# Patient Record
Sex: Male | Born: 1940
Health system: Southern US, Community
[De-identification: ages and names within clinical notes are randomized; demographics above are authoritative.]

## PROBLEM LIST (undated history)

## (undated) DIAGNOSIS — N200 Calculus of kidney: Secondary | ICD-10-CM

## (undated) DIAGNOSIS — E785 Hyperlipidemia, unspecified: Secondary | ICD-10-CM

## (undated) DIAGNOSIS — C4491 Basal cell carcinoma of skin, unspecified: Secondary | ICD-10-CM

## (undated) DIAGNOSIS — C801 Malignant (primary) neoplasm, unspecified: Secondary | ICD-10-CM

## (undated) DIAGNOSIS — M7732 Calcaneal spur, left foot: Secondary | ICD-10-CM

## (undated) DIAGNOSIS — M1990 Unspecified osteoarthritis, unspecified site: Secondary | ICD-10-CM

## (undated) DIAGNOSIS — M545 Low back pain, unspecified: Secondary | ICD-10-CM

## (undated) DIAGNOSIS — I2510 Atherosclerotic heart disease of native coronary artery without angina pectoris: Secondary | ICD-10-CM

## (undated) DIAGNOSIS — M25569 Pain in unspecified knee: Secondary | ICD-10-CM

## (undated) DIAGNOSIS — I509 Heart failure, unspecified: Secondary | ICD-10-CM

## (undated) DIAGNOSIS — I499 Cardiac arrhythmia, unspecified: Secondary | ICD-10-CM

## (undated) DIAGNOSIS — I10 Essential (primary) hypertension: Secondary | ICD-10-CM

## (undated) DIAGNOSIS — M722 Plantar fascial fibromatosis: Secondary | ICD-10-CM

## (undated) DIAGNOSIS — I4819 Other persistent atrial fibrillation: Secondary | ICD-10-CM

## (undated) DIAGNOSIS — G8929 Other chronic pain: Secondary | ICD-10-CM

## (undated) DIAGNOSIS — Z5189 Encounter for other specified aftercare: Secondary | ICD-10-CM

## (undated) DIAGNOSIS — H269 Unspecified cataract: Secondary | ICD-10-CM

## (undated) DIAGNOSIS — T7840XA Allergy, unspecified, initial encounter: Secondary | ICD-10-CM

## (undated) HISTORY — DX: Other persistent atrial fibrillation: I48.19

## (undated) HISTORY — DX: Pain in unspecified knee: M25.569

## (undated) HISTORY — DX: Calculus of kidney: N20.0

## (undated) HISTORY — DX: Hyperlipidemia, unspecified: E78.5

## (undated) HISTORY — DX: Plantar fascial fibromatosis: M72.2

## (undated) HISTORY — DX: Calcaneal spur, left foot: M77.32

## (undated) HISTORY — DX: Heart failure, unspecified: I50.9

## (undated) HISTORY — DX: Allergy, unspecified, initial encounter: T78.40XA

## (undated) HISTORY — DX: Low back pain, unspecified: M54.50

## (undated) HISTORY — DX: Atherosclerotic heart disease of native coronary artery without angina pectoris: I25.10

## (undated) HISTORY — DX: Other chronic pain: G89.29

## (undated) HISTORY — DX: Encounter for other specified aftercare: Z51.89

## (undated) HISTORY — PX: HERNIA REPAIR: SHX51

## (undated) HISTORY — DX: Low back pain: M54.5

## (undated) HISTORY — DX: Malignant (primary) neoplasm, unspecified: C80.1

## (undated) HISTORY — DX: Basal cell carcinoma of skin, unspecified: C44.91

## (undated) HISTORY — DX: Essential (primary) hypertension: I10

## (undated) HISTORY — DX: Unspecified osteoarthritis, unspecified site: M19.90

## (undated) HISTORY — PX: BACK SURGERY: SHX140

## (undated) HISTORY — PX: SPINE SURGERY: SHX786

## (undated) HISTORY — DX: Unspecified cataract: H26.9

## (undated) HISTORY — PX: VASCULAR SURGERY: SHX849

## (undated) HISTORY — PX: JOINT REPLACEMENT: SHX530

---

## 1979-05-10 HISTORY — PX: SKIN CANCER EXCISION: SHX779

## 1997-12-31 ENCOUNTER — Ambulatory Visit (HOSPITAL_COMMUNITY): Admission: RE | Admit: 1997-12-31 | Discharge: 1997-12-31 | Payer: Self-pay | Admitting: *Deleted

## 2003-03-06 HISTORY — PX: CARDIAC CATHETERIZATION: SHX172

## 2003-04-09 HISTORY — PX: CORONARY ANGIOPLASTY WITH STENT PLACEMENT: SHX49

## 2003-04-25 ENCOUNTER — Ambulatory Visit (HOSPITAL_COMMUNITY): Admission: RE | Admit: 2003-04-25 | Discharge: 2003-04-26 | Payer: Self-pay | Admitting: Interventional Cardiology

## 2004-03-05 ENCOUNTER — Inpatient Hospital Stay (HOSPITAL_BASED_OUTPATIENT_CLINIC_OR_DEPARTMENT_OTHER): Admission: RE | Admit: 2004-03-05 | Discharge: 2004-03-05 | Payer: Self-pay | Admitting: Interventional Cardiology

## 2007-11-21 ENCOUNTER — Other Ambulatory Visit: Payer: Self-pay

## 2007-11-21 ENCOUNTER — Inpatient Hospital Stay: Payer: Self-pay | Admitting: Internal Medicine

## 2008-11-27 ENCOUNTER — Encounter: Admission: RE | Admit: 2008-11-27 | Discharge: 2008-11-27 | Payer: Self-pay | Admitting: Orthopedic Surgery

## 2009-05-01 ENCOUNTER — Encounter: Payer: Self-pay | Admitting: Internal Medicine

## 2009-05-03 DIAGNOSIS — G43909 Migraine, unspecified, not intractable, without status migrainosus: Secondary | ICD-10-CM | POA: Insufficient documentation

## 2009-05-03 DIAGNOSIS — E785 Hyperlipidemia, unspecified: Secondary | ICD-10-CM | POA: Insufficient documentation

## 2009-05-03 DIAGNOSIS — M549 Dorsalgia, unspecified: Secondary | ICD-10-CM

## 2009-05-03 DIAGNOSIS — I10 Essential (primary) hypertension: Secondary | ICD-10-CM | POA: Insufficient documentation

## 2009-05-03 DIAGNOSIS — Z87891 Personal history of nicotine dependence: Secondary | ICD-10-CM

## 2009-05-03 DIAGNOSIS — I2510 Atherosclerotic heart disease of native coronary artery without angina pectoris: Secondary | ICD-10-CM

## 2009-05-03 DIAGNOSIS — R079 Chest pain, unspecified: Secondary | ICD-10-CM

## 2009-05-03 DIAGNOSIS — M25569 Pain in unspecified knee: Secondary | ICD-10-CM | POA: Insufficient documentation

## 2009-05-04 ENCOUNTER — Ambulatory Visit: Payer: Self-pay | Admitting: Internal Medicine

## 2009-05-09 ENCOUNTER — Encounter: Payer: Self-pay | Admitting: Internal Medicine

## 2009-06-08 ENCOUNTER — Ambulatory Visit: Payer: Self-pay | Admitting: Internal Medicine

## 2009-07-12 ENCOUNTER — Encounter: Payer: Self-pay | Admitting: Internal Medicine

## 2009-07-13 ENCOUNTER — Ambulatory Visit: Payer: Self-pay | Admitting: Internal Medicine

## 2009-07-16 LAB — CONVERTED CEMR LAB: Prothrombin Time: 23.6 s — ABNORMAL HIGH (ref 11.6–15.2)

## 2009-07-17 ENCOUNTER — Telehealth: Payer: Self-pay | Admitting: Internal Medicine

## 2009-07-17 ENCOUNTER — Encounter: Payer: Self-pay | Admitting: Internal Medicine

## 2009-07-17 ENCOUNTER — Ambulatory Visit: Payer: Self-pay | Admitting: Internal Medicine

## 2009-07-17 LAB — CONVERTED CEMR LAB
Basophils Relative: 1 % (ref 0.0–3.0)
Eosinophils Relative: 2 % (ref 0.0–5.0)
GFR calc non Af Amer: 88.71 mL/min (ref >60)
Glucose, Bld: 127 mg/dL — ABNORMAL HIGH (ref 70–99)
HCT: 45.7 % (ref 39.0–52.0)
Hemoglobin: 15.4 g/dL (ref 13.0–17.0)
Lymphs Abs: 2.3 10*3/uL (ref 0.7–4.0)
Monocytes Relative: 7.6 % (ref 3.0–12.0)
Neutro Abs: 5.4 10*3/uL (ref 1.4–7.7)
Potassium: 4.2 meq/L (ref 3.5–5.1)
Sodium: 145 meq/L (ref 135–145)
WBC: 8.7 10*3/uL (ref 4.5–10.5)

## 2009-07-20 ENCOUNTER — Ambulatory Visit (HOSPITAL_COMMUNITY): Admission: RE | Admit: 2009-07-20 | Discharge: 2009-07-20 | Payer: Self-pay | Admitting: Internal Medicine

## 2009-07-20 ENCOUNTER — Ambulatory Visit: Payer: Self-pay | Admitting: Internal Medicine

## 2009-07-30 ENCOUNTER — Encounter: Payer: Self-pay | Admitting: Internal Medicine

## 2009-10-05 ENCOUNTER — Ambulatory Visit: Payer: MEDICARE | Admitting: Family Medicine

## 2010-08-21 ENCOUNTER — Ambulatory Visit: Payer: MEDICARE

## 2010-10-08 NOTE — Consult Note (Signed)
Summary: Lendon Colonel   Imported By: Marylou Mccoy 09/18/2009 17:50:39  _____________________________________________________________________  External Attachment:    Type:   Image     Comment:   External Document

## 2010-12-11 LAB — MAGNESIUM: Magnesium: 2.3 mg/dL (ref 1.5–2.5)

## 2011-01-24 NOTE — Cardiovascular Report (Signed)
NAME:  Larry Bautista, Larry Bautista NO.:  [id number]   MEDICAL RECORD NO.:  [id number]                   PATIENT TYPE:  OIB   LOCATION:  NA                                   FACILITY:  [facility]   PHYSICIAN:  Lesleigh Noe, M.D.            DATE OF BIRTH:  10-Sep-1940   DATE OF PROCEDURE:  DATE OF DISCHARGE:                              CARDIAC CATHETERIZATION   INDICATIONS FOR PROCEDURE:  Mr. Cina underwent CYPHER stent implantation  in the LAD in August of 2004.  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  He had a  totally normal stress Cardiolite performed in April of 2005 without evidence  of infarction or ischemia.  The previously treated LAD lesion noted a  reduction in stenosis in the LAD that was greater than 80% down to 0  percent.  There was residual 50 to 70% narrowing in a diagonal that arose  from within the stented regions.   PROCEDURE PERFORMED:  1. Left-heart catheterization.  2. Selective coronary angiogram.  3. Left ventriculography.   DESCRIPTION OF PROCEDURE:  After informed consent, a 4-French sheath was  placed in the right femoral artery using the modified Seldinger technique.  A 4-French A-2 multipurpose catheter was used for hemodynamic recordings.  Left ventriculography by hand injection, and selective right coronary  angiography.  A #4-French left coronary catheter was used for left coronary  angiography.  The patient tolerated the procedure without complications.   RESULTS:  1. Hemodynamic data:     A. Aortic pressure 110/67.     B. Left ventricular pressure 110/11 mmHg.  2. Left ventriculography:  There is normal left ventricular size.  There is     normal overall left ventricular function.  The ejection fraction is felt     to be on the 50 to 55% range.  3. Coronary angiography:     A. Left main coronary:  Widely patent and free of any significant  obstruction.     B. Left anterior descending coronary:  The left anterior descending        coronary artery is widely patent.  The LAD is trans apical.  The        previously placed stent in the proximal LAD is widely patent without        evidence of narrowing.  There is an eccentric 50 to 70% narrowing in        the ostium of the first diagonal branch that arises from within the        stented region.  This is unchanged when compared to the prior post        intervention film.     C. Circumflex artery:  The circumflex coronary artery is large, giving        origin to a branching obtuse marginal.  No significant obstruction  is        noted.  Luminal irregularities are seen.     D. Right coronary:  The right coronary artery is widely patent.  Minimal        luminal irregularities are noted in the mid vessel.  A large PDA and        two left ventricular branches are noted that are also free of any        significant obstruction.   CONCLUSIONS:  1. Widely patent stent in the proximal LAD without evidence of restenosis     compared with the post percutaneous intervention procedure in 2004.     There is persistent 50 to 70% narrowing at the ostium of a diagonal     branch that arises from within the stented region.  This is stable when     compared to the post-intervention images.  2. Luminal irregularities are noted in the mid right coronary.  3. No significant coronary obstructive lesions are felt to be present.  This     is confirmed by a normal Cardiolite study without evidence of ischemia,     performed in April of 2005.  4. Normal LV function.                                               Lesleigh Noe, M.D.    HWS/MEDQ  D:  03/05/2004  T:  03/05/2004  Job:  [id number]   cc:   Jalene Mullet  [address], [place]. [place] [address]   Lorie Phenix  [address]  [place]  [place] [address]  Fax: [phone]

## 2011-01-24 NOTE — H&P (Signed)
NAME:  Larry Bautista, Larry Bautista                        ACCOUNT NO.:  [id number]   MEDICAL RECORD NO.:  [id number]                   PATIENT TYPE:  OIB   LOCATION:  6531                                 FACILITY:  [facility]   PHYSICIAN:  Lyn Records, M.D.                DATE OF BIRTH:  [birth year]   DATE OF ADMISSION:  04/25/2003  DATE OF DISCHARGE:                                HISTORY & PHYSICAL   ADMISSION DIAGNOSES:  1. Abnormal Cardiolite.  2. Exertional jaw and neck discomfort, suspect anginal equivalent.  3. Hyperlipidemia.  4. Hypertension.  5. History of tobacco use.  6. Family history of coronary artery disease.   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  Over the last three to four months, he has been complaining of jaw  discomfort and neck discomfort with exertion.  He also has occasional  palpitations and dyspnea on exertion.  He apparently saw a cardiologist in  [place] who recommended a coronary angiography, but the patient was  reluctant to proceed and requested a second opinion.   He saw Dr. Katrinka Blazing on March 24, 2003, and based on the patient's history,  comorbidities, and symptoms, Dr. Katrinka Blazing recommended stress Cardiolite to rule  out ischemia and get a sense of his blood pressure status.   Cardiolite stress test was performed on April 14, 2003. The patient  exercised into stage IV of Bruce protocol with a total duration of 10  minutes 30 seconds and development of neck discomfort and horizontal ST  segment depression in leads I, III, aVF, V4 through V6, which persisted into  the third minute of recovery before resolving.  The Cardiolite images  suggested subtle evidence of apical ischemia and normal wall motion with EF  of 65%.   Based on these findings, cardiac catheterization was recommended.   ALLERGIES:  CELEBREX causes rash.   MEDICATIONS:  1. Zocor 20 mg  at bedtime.  2. Multivitamins.  3. Vitamin C.  4. Baby aspirin 81 mg a day.  5. Voltaren p.r.n.  6. Duradrin 3 to 5 times a year for migraines.   PAST MEDICAL HISTORY:  1. Hyperlipidemia.  2. Migraines.  3. History of kidney stones.  4. Low back discomfort status post surgery for spondylolisthesis.  5. Chronic right knee discomfort.   SOCIAL HISTORY:  He is either divorced or widowed.  He has a 71 year old  daughter who is in excellent health.  He smoked cigarettes for two or three  years at less than a pack a day and discontinued them about 10 years ago.  No alcohol.  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   FAMILY HISTORY:  Father died of MI at 56.  Mother died of lung cancer at 57.  One sister is 36 in good health.  On brother had CABG at age 73  and is  currently 75.  There is another brother with a history of ventricular  arrhythmia.   REVIEW OF SYSTEMS:  See HPI, otherwise unremarkable.   PHYSICAL EXAMINATION:  Performed by Dr. Katrinka Blazing.  SKIN:  Clear.  HEENT:  Unremarkable.  EOMs are full.  No xanthelasma.  LUNGS:  Clear to auscultation and percussion.  COR:  No gallop, click, rub, or murmur.  ABDOMEN: Soft, bowel sounds normal.  No tenderness.  EXTREMITIES:  No edema, 2+ distal pulses, symmetric in upper and lower  extremities.  NEUROLOGIC:  Appears to be normal with no sensory or motor deficits noted.  VITAL SIGNS:  Upon admission to hospital, temperature 97.2, heart rate 66,  respirations 20, blood pressure 120/78.   IMPRESSION:  1. Abnormal Cardiolite with reproduction of angina and ST segment changes     during insertion.  2. Hyperlipidemia.  3. Dehydrated, diastolic response to exercise.  4. Family history.  5. History of tobacco.   PLAN:  Dr. Katrinka Blazing recommends proceeding with diagnostic coronary angiography  and possible intervention given abnormal Cardiolite and risk factors as  mentioned above.  The risks and indications for the procedure were reviewed  with the  patient, and he agreed to proceed.  Procedure is scheduled for  April 25, 2003, with Dr. Katrinka Blazing.       Georgiann Cocker Jernejcic, P.A.                   Lyn Records, M.D.    TCJ/MEDQ  D:  04/26/2003  T:  04/26/2003  Job:  [id number]   cc:   Dr. Darrel Reach [address]  [place], [place] [address]

## 2011-01-24 NOTE — Discharge Summary (Signed)
NAME:  Larry Bautista, Larry Bautista                        ACCOUNT NO.:  [id number]   MEDICAL RECORD NO.:  [id number]                   PATIENT TYPE:  OIB   LOCATION:  6531                                 FACILITY:  [facility]   PHYSICIAN:  Lyn Records, M.D.                DATE OF BIRTH:  [birth year]   DATE OF ADMISSION:  04/25/2003  DATE OF DISCHARGE:  04/26/2003                                 DISCHARGE SUMMARY   ADMISSION DIAGNOSES:  1. Abnormal Cardiolite suggesting apical ischemia.  2. Exertional angina.  3. Hypertension.  4. Hyperlipidemia.   DISCHARGE DIAGNOSES:  1. Cardiac catheterization revealing single vessel/branch coronary artery     disease (CAD).  2. Abnormal Cardiolite suggesting apical ischemia.  3. Exertional angina.  4. Hypertension.  5. Hyperlipidemia.   REFERRING PHYSICIAN:  Betsey Amen, M.D., [address], [place], [place]  [address]   HISTORY OF PRESENT ILLNESS:  Larry Bautista is a pleasant 70 year old  gentleman who was initially evaluated by Dr. Katrinka Blazing on March 24, 2003 because  of exertional bilateral neck discomfort.  He is a Occupational hygienist for [employer].  Apparently in the past, the patient had seen a  cardiologist in [place] because of exertional neck and jaw discomfort and  coronary angiography was recommended, but the patient was reluctant to  proceed and wanted a second opinion.   The patient describes a 3-4 month history of jaw discomfort and neck  discomfort if he walks faster than 3 mph for any length of time. He has  occasional associated palpitations and there is a history of prior  documented PVCs.  He has also been experiencing dyspnea on exertion.  There  has been no orthopnea, PND, and no syncope.   The patient's cardiac risk factors include: Hypertension, hyperlipidemia, a  history of tobacco use, and a positive family history of coronary artery  disease.   Dr. Katrinka Blazing recommended stress testing which was performed on  April 14, 2003  and was abnormal for subtle evidence of apical ischemia.  Normal wall motion  with an EF of 65%.  Abnormal electrocardiographic response to exercise with  relatively diffuse horizontal ST segment depression in V3-V6 as well as 2,  3, and AVF with associated neck discomfort that resolved in recovery.  Dr.  Katrinka Blazing recommended proceeding with coronary angiography.  The risks and  indications for the procedure were reviewed with the patient and he agreed  to proceed.   PROCEDURE:  Cardiac catheterization with intervention by Dr. Katrinka Blazing on April 25, 2003.   COMPLICATIONS:  None.   CONSULTATIONS:  None.   COURSE IN HOSPITAL:  Larry Bautista was brought to [facility] on  April 25, 2003 for elective cardiac catheterization with possible  intervention.  Prior to procedure laboratory studies were within acceptable  limits.   Cardiac catheterization performed by Dr. Katrinka Blazing revealed the following: LV  function normal.  Coronary angiography revealed the left main to be okay,  LAD 99% proximal and 80% diagonal 1 lesion.  The circ was okay and the RCA  was okay.   He proceeded with PCI of the LAD reducing the 99% diagonal/80% LAD  bifurcation lesion to a less than 10% LAD and placed a Sypher.  There was  residual 90% diagonal stenosis.  The patient tolerated the procedure well.  Dr. Katrinka Blazing recommended aspirin and Plavix for 1 year.  Integrilin was bolused  and infused for a total of 18 hours.   Postprocedure when catheter was removed the patient had a level 1 hematoma  which was expelled.  On April 26, 2003 the patient remained stable and the  right groin was okay.  He did have a potassium of 4.5, BUN 10, and  creatinine of 0.8.  Platelet count was 253.  Dr. Katrinka Blazing felt that the patient was stable to be discharged to home.  He did  recognize that this stented LAD did gel a large diagonal but there were no  ischemic symptoms.  He recommends follow up in 7-10 days with  Plavix for a year and no flying  until further evaluation.   DISCHARGE MEDICATIONS:  1. Plavix 75 mg a day for a year.  2. Aspirin 325 mg.  3. Zocor 20 mg a day.  4. Nitroglycerin 0.4 mg under the tongue as needed for chest pain.   DISCHARGE INSTRUCTIONS:  1. Activity as tolerated.  2. Low salt diet.  3. No flying.  4. Usual activity starting May 02, 2003.  5. Follow up is scheduled with Dr. Katrinka Blazing for Thursday, May 04, 2003 at 2     o'clock.  6. Note to Dr. Katrinka Blazing -- consider adding ACE inhibitor therapy for     endothelial dysfunction and possibly increasing Zocor to 40 mg a day/lipo     meta analysis.      Georgiann Cocker Jernejcic, P.A.                   Lyn Records, M.D.    TCJ/MEDQ  D:  04/26/2003  T:  04/27/2003  Job:  [id number]   cc:   B. Eddy Liszewski  [address]  [place]  [address]

## 2011-09-05 ENCOUNTER — Other Ambulatory Visit (HOSPITAL_COMMUNITY): Payer: Self-pay | Admitting: Interventional Cardiology

## 2011-09-05 DIAGNOSIS — R0600 Dyspnea, unspecified: Secondary | ICD-10-CM

## 2011-09-05 DIAGNOSIS — I4891 Unspecified atrial fibrillation: Secondary | ICD-10-CM

## 2011-09-05 DIAGNOSIS — I5189 Other ill-defined heart diseases: Secondary | ICD-10-CM

## 2011-09-10 ENCOUNTER — Encounter: Payer: Self-pay | Admitting: Interventional Cardiology

## 2011-09-15 ENCOUNTER — Ambulatory Visit (HOSPITAL_COMMUNITY)
Admission: RE | Admit: 2011-09-15 | Discharge: 2011-09-15 | Disposition: A | Discharge status: Home / Self Care | Payer: Medicare Other | Source: Ambulatory Visit | Attending: Interventional Cardiology | Admitting: Interventional Cardiology

## 2011-09-15 DIAGNOSIS — I499 Cardiac arrhythmia, unspecified: Secondary | ICD-10-CM | POA: Insufficient documentation

## 2011-09-15 DIAGNOSIS — R0600 Dyspnea, unspecified: Secondary | ICD-10-CM

## 2011-09-15 DIAGNOSIS — I4891 Unspecified atrial fibrillation: Secondary | ICD-10-CM

## 2011-09-15 DIAGNOSIS — I5189 Other ill-defined heart diseases: Secondary | ICD-10-CM

## 2011-09-15 MED ORDER — GADOBENATE DIMEGLUMINE 529 MG/ML IV SOLN
20.0000 mL | Freq: Once | INTRAVENOUS | Status: AC
  Administered 2011-09-15: 20 mL via INTRAVENOUS

## 2011-09-18 DIAGNOSIS — R0602 Shortness of breath: Secondary | ICD-10-CM | POA: Diagnosis not present

## 2011-10-01 DIAGNOSIS — I4891 Unspecified atrial fibrillation: Secondary | ICD-10-CM | POA: Diagnosis not present

## 2011-10-01 DIAGNOSIS — Z7901 Long term (current) use of anticoagulants: Secondary | ICD-10-CM | POA: Diagnosis not present

## 2011-11-07 ENCOUNTER — Encounter: Payer: Self-pay | Admitting: *Deleted

## 2011-11-10 ENCOUNTER — Ambulatory Visit (INDEPENDENT_AMBULATORY_CARE_PROVIDER_SITE_OTHER): Payer: Medicare Other | Admitting: Internal Medicine

## 2011-11-10 ENCOUNTER — Encounter: Payer: Self-pay | Admitting: Internal Medicine

## 2011-11-10 DIAGNOSIS — I10 Essential (primary) hypertension: Secondary | ICD-10-CM | POA: Diagnosis not present

## 2011-11-10 DIAGNOSIS — I4891 Unspecified atrial fibrillation: Secondary | ICD-10-CM

## 2011-11-10 DIAGNOSIS — I2510 Atherosclerotic heart disease of native coronary artery without angina pectoris: Secondary | ICD-10-CM | POA: Diagnosis not present

## 2011-11-10 NOTE — Assessment & Plan Note (Signed)
Given myoview, SOB seems less likely to be due to CAD.

## 2011-11-10 NOTE — Assessment & Plan Note (Signed)
Stable No change required today  

## 2011-11-10 NOTE — Patient Instructions (Signed)
Your physician recommends that you schedule a follow-up appointment in: 6 weeks with Dr Allred  

## 2011-11-10 NOTE — Assessment & Plan Note (Signed)
Longstanding persistent afib presently appropriately anticoagulated with coumadin. He has only tried multaq for rhythm control.  I think that given his DOE, that is would be very reasonable to try to establish/ maintain sinus rhythm to see if these symptoms improve.  I would advised that he try tikosyn. Presently, he is reluctant to consider tikosyn, stating that he would like to see if he feels better with recently self decreased toprol.  I will respect his request to defer initiation of tikosyn.  He will return in 6 weeks.  If he remains dyspneic on his lower dose of toprol then he may be more willing to consider tikosyn at that time.  He is very clear that he wishes to avoid catheter ablation.

## 2011-11-10 NOTE — Progress Notes (Signed)
Primary Cardiologist:  Dr Katrinka Blazing  The patient presents today for routine electrophysiology followup.  I have not seen him in several years.  He has longstanding persistent afib at this point.  He has tried only Multaq for rhythm control and was unsuccessfully cardioverted on this medication.  He has previously declined ablation and therefore has been maintained on a rate control strategy with coumadin for anticoagulation.  Since last being seen in our clinic, the patient reports doing reasonably well.  He developed significant increase in shortness of breath several months ago.  He reports dypsnea with moderate activity.  Echo, nuclear study, and cardiac MRI were all performed (I have reviewed these today).  The patient has decreased his toprol to 25mg  daily and thinks that his symptoms may have improved.  He states that he presently has a URI and is not sure whether or not his DOE has improved but "thinks it may have".   Today, he denies symptoms of palpitations, chest pain, orthopnea, PND, lower extremity edema, dizziness, presyncope, syncope, or neurologic sequela.  The patient feels that he is tolerating medications without difficulties and is otherwise without complaint today.   Past Medical History  Diagnosis Date  . Persistent atrial fibrillation     longstanding persistent (since 05/2008)  . Coronary artery disease     with LAD DE stent 2004  . Hypertension   . Hyperlipidemia   . Migraine   . Kidney stones   . Chronic knee pain   . Lower back pain     status post surgery for spondylolisthesis   Past Surgical History  Procedure Date  . Back surgery     spondylolisthesis  . Coronary angioplasty with stent placement 04/2003    CYPHER stent implantation in the LAD  . Cardiac catheterization 03/06/2003    Current Outpatient Prescriptions  Medication Sig Dispense Refill  . aspirin 81 MG tablet Take 81 mg by mouth daily.      [name] atorvastatin (LIPITOR) 10 MG tablet Take 10 mg by mouth  daily.      . diphenhydramine-acetaminophen (TYLENOL PM) 25-500 MG TABS Take 1 tablet by mouth at bedtime as needed.      . fish oil-omega-3 fatty acids 1000 MG capsule Take 1 g by mouth daily.      . furosemide (LASIX) 20 MG tablet Take 20 mg by mouth daily.      . metoprolol succinate (TOPROL-XL) 50 MG 24 hr tablet Take 50 mg by mouth daily. Take with or immediately following a meal.      . multivitamin (THERAGRAN) per tablet Take 1 tablet by mouth daily.      . nitroGLYCERIN (NITROSTAT) 0.4 MG SL tablet Place 0.4 mg under the tongue every 5 (five) minutes as needed.      . Potassium Gluconate 550 MG TABS Take 550 mg by mouth daily. ( OTC potassium )      . psyllium (METAMUCIL) 58.6 % powder Take 1 packet by mouth as needed.      . vitamin C (ASCORBIC ACID) 500 MG tablet Take 500 mg by mouth daily.      [name] warfarin (COUMADIN) 5 MG tablet Take by mouth. As directed by the [facility]        Allergies  Allergen Reactions  . Simvastatin   . Celecoxib Rash    History   Social History  . Marital Status: Legally Separated    Spouse Name: N/A    Number of Children: N/A  . Years  of Education: N/A   Occupational History  . Not on file.   Social History Main Topics  . Smoking status: Former Smoker    Types: Cigarettes    Quit date: 11/06/1996  . Smokeless tobacco: Not on file  . Alcohol Use: No  . Drug Use: No  . Sexually Active: Not on file   Other Topics Concern  . Not on file   Social History Narrative  . No narrative on file    Family History  Problem Relation Age of Onset  . Heart attack Father   . Cancer Father     rectal  . Atrial fibrillation Brother   . Atrial fibrillation Brother     resolved with cardioversion  . Coronary artery disease Brother     with CABG at 13    ROS-  All systems are reviewed and are negative except as outlined in the HPI above   Physical Exam: Filed Vitals:   11/10/11 0920  BP: 130/70  Pulse: 82  Resp: 18  Height: 5\' 8"   (1.727 m)  Weight: 176 lb 1.9 oz (79.888 kg)    GEN- The patient is well appearing, alert and oriented x 3 today.   Head- normocephalic, atraumatic Eyes-  Sclera clear, conjunctiva pink Ears- hearing intact Oropharynx- clear Neck- supple, no JVP Lymph- no cervical lymphadenopathy Lungs- Clear to ausculation bilaterally, normal work of breathing Heart- irregular rate and rhythm, no murmurs, rubs or gallops, PMI not laterally displaced GI- soft, NT, ND, + BS Extremities- no clubbing, cyanosis, or edema  ekg today reveals afib, V rate 82 bpm, QRS 76, Qtc 418, otherwise normal ekg  Assessment and Plan:

## 2011-11-13 DIAGNOSIS — I4891 Unspecified atrial fibrillation: Secondary | ICD-10-CM | POA: Diagnosis not present

## 2011-11-13 DIAGNOSIS — Z7901 Long term (current) use of anticoagulants: Secondary | ICD-10-CM | POA: Diagnosis not present

## 2011-11-13 DIAGNOSIS — E785 Hyperlipidemia, unspecified: Secondary | ICD-10-CM | POA: Diagnosis not present

## 2011-12-12 ENCOUNTER — Encounter: Payer: Self-pay | Admitting: Internal Medicine

## 2011-12-15 DIAGNOSIS — I4891 Unspecified atrial fibrillation: Secondary | ICD-10-CM | POA: Diagnosis not present

## 2011-12-15 DIAGNOSIS — Z7901 Long term (current) use of anticoagulants: Secondary | ICD-10-CM | POA: Diagnosis not present

## 2011-12-22 ENCOUNTER — Ambulatory Visit: Payer: MEDICARE | Admitting: Internal Medicine

## 2012-01-26 DIAGNOSIS — Z7901 Long term (current) use of anticoagulants: Secondary | ICD-10-CM | POA: Diagnosis not present

## 2012-01-26 DIAGNOSIS — I10 Essential (primary) hypertension: Secondary | ICD-10-CM | POA: Diagnosis not present

## 2012-01-26 DIAGNOSIS — I4891 Unspecified atrial fibrillation: Secondary | ICD-10-CM | POA: Diagnosis not present

## 2012-01-26 DIAGNOSIS — I2510 Atherosclerotic heart disease of native coronary artery without angina pectoris: Secondary | ICD-10-CM | POA: Diagnosis not present

## 2012-01-26 DIAGNOSIS — R0989 Other specified symptoms and signs involving the circulatory and respiratory systems: Secondary | ICD-10-CM | POA: Diagnosis not present

## 2012-01-26 DIAGNOSIS — R0609 Other forms of dyspnea: Secondary | ICD-10-CM | POA: Diagnosis not present

## 2012-02-16 DIAGNOSIS — G4730 Sleep apnea, unspecified: Secondary | ICD-10-CM | POA: Diagnosis not present

## 2012-02-16 DIAGNOSIS — I2510 Atherosclerotic heart disease of native coronary artery without angina pectoris: Secondary | ICD-10-CM | POA: Diagnosis not present

## 2012-02-16 DIAGNOSIS — R413 Other amnesia: Secondary | ICD-10-CM | POA: Diagnosis not present

## 2012-02-16 DIAGNOSIS — R5383 Other fatigue: Secondary | ICD-10-CM | POA: Diagnosis not present

## 2012-02-16 DIAGNOSIS — R0602 Shortness of breath: Secondary | ICD-10-CM | POA: Diagnosis not present

## 2012-02-16 LAB — CBC AND DIFFERENTIAL
HCT: 50 % (ref 41–53)
Hemoglobin: 16.8 g/dL (ref 13.5–17.5)
Platelets: 258 10*3/uL (ref 150–399)
WBC: 9.6 10*3/mL

## 2012-02-16 LAB — BASIC METABOLIC PANEL
BUN: 14 mg/dL (ref 4–21)
CREATININE: 0.7 mg/dL (ref <=1.3)
Glucose: 87 mg/dL
Potassium: 5.3 mmol/L (ref 3.4–5.3)
Sodium: 145 mmol/L (ref 137–147)

## 2012-02-16 LAB — HEPATIC FUNCTION PANEL
ALT: 36 U/L (ref 10–40)
AST: 29 U/L (ref 14–40)

## 2012-02-23 DIAGNOSIS — I10 Essential (primary) hypertension: Secondary | ICD-10-CM | POA: Diagnosis not present

## 2012-02-23 DIAGNOSIS — I4891 Unspecified atrial fibrillation: Secondary | ICD-10-CM | POA: Diagnosis not present

## 2012-02-23 DIAGNOSIS — M79609 Pain in unspecified limb: Secondary | ICD-10-CM | POA: Diagnosis not present

## 2012-02-23 DIAGNOSIS — I2510 Atherosclerotic heart disease of native coronary artery without angina pectoris: Secondary | ICD-10-CM | POA: Diagnosis not present

## 2012-02-23 DIAGNOSIS — E785 Hyperlipidemia, unspecified: Secondary | ICD-10-CM | POA: Diagnosis not present

## 2012-03-08 DIAGNOSIS — I4891 Unspecified atrial fibrillation: Secondary | ICD-10-CM | POA: Diagnosis not present

## 2012-03-08 DIAGNOSIS — Z7901 Long term (current) use of anticoagulants: Secondary | ICD-10-CM | POA: Diagnosis not present

## 2012-03-15 DIAGNOSIS — N401 Enlarged prostate with lower urinary tract symptoms: Secondary | ICD-10-CM | POA: Diagnosis not present

## 2012-03-15 DIAGNOSIS — N400 Enlarged prostate without lower urinary tract symptoms: Secondary | ICD-10-CM | POA: Diagnosis not present

## 2012-03-15 DIAGNOSIS — R3129 Other microscopic hematuria: Secondary | ICD-10-CM | POA: Diagnosis not present

## 2012-03-18 DIAGNOSIS — Z7901 Long term (current) use of anticoagulants: Secondary | ICD-10-CM | POA: Diagnosis not present

## 2012-03-18 DIAGNOSIS — I4891 Unspecified atrial fibrillation: Secondary | ICD-10-CM | POA: Diagnosis not present

## 2012-04-05 DIAGNOSIS — Z7901 Long term (current) use of anticoagulants: Secondary | ICD-10-CM | POA: Diagnosis not present

## 2012-04-05 DIAGNOSIS — I4891 Unspecified atrial fibrillation: Secondary | ICD-10-CM | POA: Diagnosis not present

## 2012-05-03 DIAGNOSIS — I4891 Unspecified atrial fibrillation: Secondary | ICD-10-CM | POA: Diagnosis not present

## 2012-05-03 DIAGNOSIS — Z7901 Long term (current) use of anticoagulants: Secondary | ICD-10-CM | POA: Diagnosis not present

## 2012-05-17 DIAGNOSIS — M545 Low back pain, unspecified: Secondary | ICD-10-CM | POA: Diagnosis not present

## 2012-05-17 DIAGNOSIS — Z23 Encounter for immunization: Secondary | ICD-10-CM | POA: Diagnosis not present

## 2012-05-17 DIAGNOSIS — J189 Pneumonia, unspecified organism: Secondary | ICD-10-CM | POA: Diagnosis not present

## 2012-05-17 DIAGNOSIS — I2510 Atherosclerotic heart disease of native coronary artery without angina pectoris: Secondary | ICD-10-CM | POA: Diagnosis not present

## 2012-05-19 DIAGNOSIS — Z23 Encounter for immunization: Secondary | ICD-10-CM | POA: Diagnosis not present

## 2012-06-14 DIAGNOSIS — I4891 Unspecified atrial fibrillation: Secondary | ICD-10-CM | POA: Diagnosis not present

## 2012-06-14 DIAGNOSIS — E785 Hyperlipidemia, unspecified: Secondary | ICD-10-CM | POA: Diagnosis not present

## 2012-06-14 DIAGNOSIS — Z7901 Long term (current) use of anticoagulants: Secondary | ICD-10-CM | POA: Diagnosis not present

## 2012-07-16 DIAGNOSIS — I4891 Unspecified atrial fibrillation: Secondary | ICD-10-CM | POA: Diagnosis not present

## 2012-07-16 DIAGNOSIS — Z7901 Long term (current) use of anticoagulants: Secondary | ICD-10-CM | POA: Diagnosis not present

## 2012-07-20 DIAGNOSIS — G459 Transient cerebral ischemic attack, unspecified: Secondary | ICD-10-CM | POA: Diagnosis not present

## 2012-07-20 DIAGNOSIS — I5032 Chronic diastolic (congestive) heart failure: Secondary | ICD-10-CM | POA: Diagnosis not present

## 2012-07-20 DIAGNOSIS — Z7901 Long term (current) use of anticoagulants: Secondary | ICD-10-CM | POA: Diagnosis not present

## 2012-07-20 DIAGNOSIS — I4891 Unspecified atrial fibrillation: Secondary | ICD-10-CM | POA: Diagnosis not present

## 2012-07-20 DIAGNOSIS — I2510 Atherosclerotic heart disease of native coronary artery without angina pectoris: Secondary | ICD-10-CM | POA: Diagnosis not present

## 2012-07-20 DIAGNOSIS — I10 Essential (primary) hypertension: Secondary | ICD-10-CM | POA: Diagnosis not present

## 2012-08-03 DIAGNOSIS — I4891 Unspecified atrial fibrillation: Secondary | ICD-10-CM | POA: Diagnosis not present

## 2012-08-03 DIAGNOSIS — Z7901 Long term (current) use of anticoagulants: Secondary | ICD-10-CM | POA: Diagnosis not present

## 2012-08-20 DIAGNOSIS — G459 Transient cerebral ischemic attack, unspecified: Secondary | ICD-10-CM | POA: Diagnosis not present

## 2012-08-24 DIAGNOSIS — I10 Essential (primary) hypertension: Secondary | ICD-10-CM | POA: Diagnosis not present

## 2012-08-24 DIAGNOSIS — I4891 Unspecified atrial fibrillation: Secondary | ICD-10-CM | POA: Diagnosis not present

## 2012-08-24 DIAGNOSIS — E785 Hyperlipidemia, unspecified: Secondary | ICD-10-CM | POA: Diagnosis not present

## 2012-08-24 DIAGNOSIS — I2510 Atherosclerotic heart disease of native coronary artery without angina pectoris: Secondary | ICD-10-CM | POA: Diagnosis not present

## 2012-08-24 DIAGNOSIS — Z7901 Long term (current) use of anticoagulants: Secondary | ICD-10-CM | POA: Diagnosis not present

## 2012-09-08 HISTORY — PX: HAND SURGERY: SHX662

## 2012-10-05 DIAGNOSIS — I4891 Unspecified atrial fibrillation: Secondary | ICD-10-CM | POA: Diagnosis not present

## 2012-10-05 DIAGNOSIS — Z7901 Long term (current) use of anticoagulants: Secondary | ICD-10-CM | POA: Diagnosis not present

## 2012-10-07 DIAGNOSIS — M5030 Other cervical disc degeneration, unspecified cervical region: Secondary | ICD-10-CM | POA: Diagnosis not present

## 2012-10-07 DIAGNOSIS — M47812 Spondylosis without myelopathy or radiculopathy, cervical region: Secondary | ICD-10-CM | POA: Diagnosis not present

## 2012-10-07 DIAGNOSIS — M542 Cervicalgia: Secondary | ICD-10-CM | POA: Diagnosis not present

## 2012-10-07 DIAGNOSIS — IMO0002 Reserved for concepts with insufficient information to code with codable children: Secondary | ICD-10-CM | POA: Diagnosis not present

## 2012-10-07 DIAGNOSIS — M5412 Radiculopathy, cervical region: Secondary | ICD-10-CM | POA: Diagnosis not present

## 2012-10-13 ENCOUNTER — Other Ambulatory Visit: Payer: Self-pay | Admitting: Neurosurgery

## 2012-10-13 DIAGNOSIS — M5412 Radiculopathy, cervical region: Secondary | ICD-10-CM

## 2012-10-13 DIAGNOSIS — M47812 Spondylosis without myelopathy or radiculopathy, cervical region: Secondary | ICD-10-CM

## 2012-10-13 DIAGNOSIS — M5030 Other cervical disc degeneration, unspecified cervical region: Secondary | ICD-10-CM

## 2012-10-13 DIAGNOSIS — IMO0002 Reserved for concepts with insufficient information to code with codable children: Secondary | ICD-10-CM

## 2012-10-26 DIAGNOSIS — I4891 Unspecified atrial fibrillation: Secondary | ICD-10-CM | POA: Diagnosis not present

## 2012-10-26 DIAGNOSIS — G5620 Lesion of ulnar nerve, unspecified upper limb: Secondary | ICD-10-CM | POA: Diagnosis not present

## 2012-10-26 DIAGNOSIS — Z7901 Long term (current) use of anticoagulants: Secondary | ICD-10-CM | POA: Diagnosis not present

## 2012-10-26 DIAGNOSIS — G5600 Carpal tunnel syndrome, unspecified upper limb: Secondary | ICD-10-CM | POA: Diagnosis not present

## 2012-10-26 DIAGNOSIS — M4802 Spinal stenosis, cervical region: Secondary | ICD-10-CM | POA: Diagnosis not present

## 2012-11-04 ENCOUNTER — Ambulatory Visit
Admission: RE | Admit: 2012-11-04 | Discharge: 2012-11-04 | Disposition: A | Discharge status: Home / Self Care | Payer: Medicare Other | Source: Ambulatory Visit | Attending: Neurosurgery | Admitting: Neurosurgery

## 2012-11-04 DIAGNOSIS — IMO0002 Reserved for concepts with insufficient information to code with codable children: Secondary | ICD-10-CM

## 2012-11-04 DIAGNOSIS — M5412 Radiculopathy, cervical region: Secondary | ICD-10-CM

## 2012-11-04 DIAGNOSIS — M5030 Other cervical disc degeneration, unspecified cervical region: Secondary | ICD-10-CM

## 2012-11-04 DIAGNOSIS — M5137 Other intervertebral disc degeneration, lumbosacral region: Secondary | ICD-10-CM | POA: Diagnosis not present

## 2012-11-04 DIAGNOSIS — M47812 Spondylosis without myelopathy or radiculopathy, cervical region: Secondary | ICD-10-CM

## 2012-11-04 MED ORDER — GADOBENATE DIMEGLUMINE 529 MG/ML IV SOLN
16.0000 mL | Freq: Once | INTRAVENOUS | Status: AC | PRN
  Administered 2012-11-04: 16 mL via INTRAVENOUS

## 2012-11-09 DIAGNOSIS — M546 Pain in thoracic spine: Secondary | ICD-10-CM | POA: Diagnosis not present

## 2012-11-09 DIAGNOSIS — M545 Low back pain, unspecified: Secondary | ICD-10-CM | POA: Diagnosis not present

## 2012-11-17 DIAGNOSIS — J189 Pneumonia, unspecified organism: Secondary | ICD-10-CM | POA: Diagnosis not present

## 2012-11-17 DIAGNOSIS — Z1339 Encounter for screening examination for other mental health and behavioral disorders: Secondary | ICD-10-CM | POA: Diagnosis not present

## 2012-11-17 DIAGNOSIS — I2510 Atherosclerotic heart disease of native coronary artery without angina pectoris: Secondary | ICD-10-CM | POA: Diagnosis not present

## 2012-11-17 DIAGNOSIS — Z23 Encounter for immunization: Secondary | ICD-10-CM | POA: Diagnosis not present

## 2012-11-17 DIAGNOSIS — Z1331 Encounter for screening for depression: Secondary | ICD-10-CM | POA: Diagnosis not present

## 2012-11-17 DIAGNOSIS — Z0000 Encounter for general adult medical examination without abnormal findings: Secondary | ICD-10-CM | POA: Diagnosis not present

## 2012-11-22 DIAGNOSIS — I4891 Unspecified atrial fibrillation: Secondary | ICD-10-CM | POA: Diagnosis not present

## 2012-11-22 DIAGNOSIS — Z7901 Long term (current) use of anticoagulants: Secondary | ICD-10-CM | POA: Diagnosis not present

## 2012-12-15 DIAGNOSIS — G5600 Carpal tunnel syndrome, unspecified upper limb: Secondary | ICD-10-CM | POA: Diagnosis not present

## 2013-01-03 DIAGNOSIS — Z7901 Long term (current) use of anticoagulants: Secondary | ICD-10-CM | POA: Diagnosis not present

## 2013-01-03 DIAGNOSIS — I4891 Unspecified atrial fibrillation: Secondary | ICD-10-CM | POA: Diagnosis not present

## 2013-02-01 DIAGNOSIS — Z7901 Long term (current) use of anticoagulants: Secondary | ICD-10-CM | POA: Diagnosis not present

## 2013-02-01 DIAGNOSIS — I4891 Unspecified atrial fibrillation: Secondary | ICD-10-CM | POA: Diagnosis not present

## 2013-02-08 DIAGNOSIS — Z1339 Encounter for screening examination for other mental health and behavioral disorders: Secondary | ICD-10-CM | POA: Diagnosis not present

## 2013-02-08 DIAGNOSIS — H8110 Benign paroxysmal vertigo, unspecified ear: Secondary | ICD-10-CM | POA: Diagnosis not present

## 2013-02-08 DIAGNOSIS — Z1331 Encounter for screening for depression: Secondary | ICD-10-CM | POA: Diagnosis not present

## 2013-02-08 DIAGNOSIS — I2510 Atherosclerotic heart disease of native coronary artery without angina pectoris: Secondary | ICD-10-CM | POA: Diagnosis not present

## 2013-03-14 DIAGNOSIS — I4891 Unspecified atrial fibrillation: Secondary | ICD-10-CM | POA: Diagnosis not present

## 2013-03-14 DIAGNOSIS — Z7901 Long term (current) use of anticoagulants: Secondary | ICD-10-CM | POA: Diagnosis not present

## 2013-03-15 DIAGNOSIS — G5600 Carpal tunnel syndrome, unspecified upper limb: Secondary | ICD-10-CM | POA: Diagnosis not present

## 2013-03-16 DIAGNOSIS — N200 Calculus of kidney: Secondary | ICD-10-CM | POA: Diagnosis not present

## 2013-03-16 DIAGNOSIS — R310 Gross hematuria: Secondary | ICD-10-CM | POA: Diagnosis not present

## 2013-03-16 DIAGNOSIS — N281 Cyst of kidney, acquired: Secondary | ICD-10-CM | POA: Diagnosis not present

## 2013-03-16 DIAGNOSIS — R319 Hematuria, unspecified: Secondary | ICD-10-CM | POA: Diagnosis not present

## 2013-03-16 DIAGNOSIS — N401 Enlarged prostate with lower urinary tract symptoms: Secondary | ICD-10-CM | POA: Diagnosis not present

## 2013-03-23 DIAGNOSIS — M5030 Other cervical disc degeneration, unspecified cervical region: Secondary | ICD-10-CM | POA: Diagnosis not present

## 2013-03-23 DIAGNOSIS — M542 Cervicalgia: Secondary | ICD-10-CM | POA: Diagnosis not present

## 2013-03-23 DIAGNOSIS — M48061 Spinal stenosis, lumbar region without neurogenic claudication: Secondary | ICD-10-CM | POA: Diagnosis not present

## 2013-03-31 DIAGNOSIS — N401 Enlarged prostate with lower urinary tract symptoms: Secondary | ICD-10-CM | POA: Diagnosis not present

## 2013-03-31 DIAGNOSIS — N200 Calculus of kidney: Secondary | ICD-10-CM | POA: Diagnosis not present

## 2013-03-31 DIAGNOSIS — R310 Gross hematuria: Secondary | ICD-10-CM | POA: Diagnosis not present

## 2013-04-26 DIAGNOSIS — I4891 Unspecified atrial fibrillation: Secondary | ICD-10-CM | POA: Diagnosis not present

## 2013-04-26 DIAGNOSIS — Z7901 Long term (current) use of anticoagulants: Secondary | ICD-10-CM | POA: Diagnosis not present

## 2013-05-16 DIAGNOSIS — H2510 Age-related nuclear cataract, unspecified eye: Secondary | ICD-10-CM | POA: Diagnosis not present

## 2013-05-21 DIAGNOSIS — Z23 Encounter for immunization: Secondary | ICD-10-CM | POA: Diagnosis not present

## 2013-06-07 DIAGNOSIS — I4891 Unspecified atrial fibrillation: Secondary | ICD-10-CM | POA: Diagnosis not present

## 2013-06-07 DIAGNOSIS — Z7901 Long term (current) use of anticoagulants: Secondary | ICD-10-CM | POA: Diagnosis not present

## 2013-07-21 ENCOUNTER — Ambulatory Visit (INDEPENDENT_AMBULATORY_CARE_PROVIDER_SITE_OTHER): Payer: Medicare Other | Admitting: Pharmacist

## 2013-07-21 DIAGNOSIS — I4891 Unspecified atrial fibrillation: Secondary | ICD-10-CM

## 2013-07-21 LAB — POCT INR: INR: 2.1

## 2013-08-01 ENCOUNTER — Other Ambulatory Visit: Payer: Self-pay | Admitting: *Deleted

## 2013-08-01 DIAGNOSIS — E785 Hyperlipidemia, unspecified: Secondary | ICD-10-CM

## 2013-08-10 ENCOUNTER — Other Ambulatory Visit: Payer: Self-pay | Admitting: *Deleted

## 2013-08-10 MED ORDER — WARFARIN SODIUM 5 MG PO TABS: 5.0000 mg | ORAL_TABLET | ORAL | Status: DC

## 2013-08-24 ENCOUNTER — Other Ambulatory Visit (INDEPENDENT_AMBULATORY_CARE_PROVIDER_SITE_OTHER): Payer: Medicare Other

## 2013-08-24 ENCOUNTER — Encounter: Payer: Self-pay | Admitting: Interventional Cardiology

## 2013-08-24 ENCOUNTER — Ambulatory Visit (INDEPENDENT_AMBULATORY_CARE_PROVIDER_SITE_OTHER): Payer: Medicare Other | Admitting: Interventional Cardiology

## 2013-08-24 ENCOUNTER — Ambulatory Visit (INDEPENDENT_AMBULATORY_CARE_PROVIDER_SITE_OTHER): Payer: Medicare Other | Admitting: *Deleted

## 2013-08-24 VITALS — BP 122/80 | Ht 68.0 in | Wt 180.8 lb

## 2013-08-24 DIAGNOSIS — I2510 Atherosclerotic heart disease of native coronary artery without angina pectoris: Secondary | ICD-10-CM

## 2013-08-24 DIAGNOSIS — E785 Hyperlipidemia, unspecified: Secondary | ICD-10-CM | POA: Diagnosis not present

## 2013-08-24 DIAGNOSIS — I10 Essential (primary) hypertension: Secondary | ICD-10-CM

## 2013-08-24 DIAGNOSIS — I4891 Unspecified atrial fibrillation: Secondary | ICD-10-CM | POA: Diagnosis not present

## 2013-08-24 DIAGNOSIS — Z87891 Personal history of nicotine dependence: Secondary | ICD-10-CM

## 2013-08-24 LAB — HEPATIC FUNCTION PANEL
ALT: 28 U/L (ref 0–53)
AST: 27 U/L (ref 0–37)
Albumin: 4.5 g/dL (ref 3.5–5.2)
Total Bilirubin: 0.9 mg/dL (ref 0.3–1.2)

## 2013-08-24 LAB — LIPID PANEL
Cholesterol: 208 mg/dL — ABNORMAL HIGH (ref 0–200)
Triglycerides: 218 mg/dL — ABNORMAL HIGH (ref 0.0–149.0)

## 2013-08-24 NOTE — Progress Notes (Signed)
Patient ID: Larry Bautista, male   DOB: 1940/12/21, 72 y.o.   MRN: [id number]    [address] [place], [place]  [address] Phone: [phone] Fax:  [phone]  Date:  08/24/2013   ID:  Larry Bautista, DOB 08/04/1941, MRN [id number]  PCP:  No primary provider on file.   ASSESSMENT:  1. Chronic atrial fibrillation with good rate control 2. Hypertension under good control 3. Coronary artery disease, asymptomatic 4. Chronic oral anticoagulation therapy with occasional rectal bleeding related to hemorrhoids according to the patient  PLAN:  1. Followup in one year 2. Active lifestyle 3. Monitor diet including decreasing saturated fat and salt 4. Continuing Coumadin clinic 5. No change in current therapy  SUBJECTIVE: Larry Bautista is a 72 y.o. male  who has occasional blood in the stool related to hemorrhoids and warfarin therapy. Otherwise no complaints other than exertional dyspnea that is unchanged over the last several years. He is relatively more sedentary than prior. He has no angina. He denies palpitations, syncope, and edema. Overall he feels well.   Wt Readings from Last 3 Encounters:  08/24/13 180 lb 12.8 oz (82.01 kg)  11/10/11 176 lb 1.9 oz (79.888 kg)  07/13/09 181 lb (82.101 kg)     Past Medical History  Diagnosis Date  . Persistent atrial fibrillation     longstanding persistent (since 05/2008)  . Coronary artery disease     with LAD DE stent 2004  . Hypertension   . Hyperlipidemia   . Migraine   . Kidney stones   . Chronic knee pain   . Lower back pain     status post surgery for spondylolisthesis    Current Outpatient Prescriptions  Medication Sig Dispense Refill  . aspirin 81 MG tablet Take 81 mg by mouth daily.      . diphenhydramine-acetaminophen (TYLENOL PM) 25-500 MG TABS Take 1 tablet by mouth at bedtime as needed.      [name] eplerenone (INSPRA) 25 MG tablet Take 25 mg by mouth daily.      . fish oil-omega-3 fatty acids 1000 MG  capsule Take 1 g by mouth daily.      . metoprolol succinate (TOPROL-XL) 50 MG 24 hr tablet Take 50 mg by mouth daily. Take with or immediately following a meal.      . multivitamin (THERAGRAN) per tablet Take 1 tablet by mouth daily.      . nitroGLYCERIN (NITROSTAT) 0.4 MG SL tablet Place 0.4 mg under the tongue every 5 (five) minutes as needed.      . Potassium Gluconate 550 MG TABS Take 550 mg by mouth daily. ( OTC potassium )      . pravastatin (PRAVACHOL) 80 MG tablet Take 80 mg by mouth daily.      . psyllium (METAMUCIL) 58.6 % powder Take 1 packet by mouth as needed.      . vitamin C (ASCORBIC ACID) 500 MG tablet Take 500 mg by mouth daily.      . VOLTAREN 1 % GEL As directed      . warfarin (COUMADIN) 5 MG tablet Take 1 tablet (5 mg total) by mouth as directed. As directed by the [facility]  30 tablet  2   No current facility-administered medications for this visit.    Allergies:    Allergies  Allergen Reactions  . Simvastatin   . Celecoxib Rash    Social History:  The patient  reports that he quit smoking  about 16 years ago. His smoking use included Cigarettes. He smoked 0.00 packs per day. He does not have any smokeless tobacco history on file. He reports that he does not drink alcohol or use illicit drugs.   ROS:  Please see the history of present illness.   Denies neurological symptoms. No edema, or episodes of syncope.   All other systems reviewed and negative.   OBJECTIVE: VS:  BP 122/80  Ht 5\' 8"  (1.727 m)  Wt 180 lb 12.8 oz (82.01 kg)  BMI 27.50 kg/m2 Well nourished, well developed, in no acute distress,  appears his stated age HEENT: normal Neck: JVD  flat. Carotid bruit  absent  Cardiac:  normal S1, S2; IIRR; no murmur Lungs:  clear to auscultation bilaterally, no wheezing, rhonchi or rales Abd: soft, nontender, no hepatomegaly Ext: Edema  absent. Pulses  2+ Skin: warm and dry Neuro:  CNs 2-12 intact, no focal abnormalities noted  EKG:   Atrial  fibrillation with controlled ventricular response       Signed, Darci Needle III, MD 08/24/2013 9:58 AM

## 2013-08-24 NOTE — Patient Instructions (Signed)
Your physician recommends that you continue on your current medications as directed. Please refer to the Current Medication list given to you today.  Your physician wants you to follow-up in: 1 year You will receive a reminder letter in the mail two months in advance. If you don't receive a letter, please call our office to schedule the follow-up appointment.  Call the office if you are experiencing any chest pain or bleeding [phone]  Your physician discussed the importance of regular exercise and recommended that you start or continue a regular exercise program for good health.  Follow a healthy diet low sodium and low fat

## 2013-08-25 ENCOUNTER — Other Ambulatory Visit: Payer: Medicare Other

## 2013-08-25 ENCOUNTER — Telehealth: Payer: Self-pay

## 2013-08-25 DIAGNOSIS — E785 Hyperlipidemia, unspecified: Secondary | ICD-10-CM

## 2013-08-25 NOTE — Telephone Encounter (Signed)
pt given lab results and Dr.Smith instructions.Elevated lipids. Not at target so needs diet and exercise.pt verbalized understanding.

## 2013-08-25 NOTE — Telephone Encounter (Signed)
Message copied by Jarvis Newcomer on Thu Aug 25, 2013  3:35 PM ------      Message from: Verdis Prime      Created: Thu Aug 25, 2013  7:57 AM       Elevated lipids. Not at target so needs diet and exercise. ------

## 2013-09-22 ENCOUNTER — Other Ambulatory Visit: Payer: Self-pay

## 2013-09-22 MED ORDER — EPLERENONE 25 MG PO TABS: 25.0000 mg | ORAL_TABLET | Freq: Every day | ORAL | Status: DC

## 2013-09-28 ENCOUNTER — Ambulatory Visit (INDEPENDENT_AMBULATORY_CARE_PROVIDER_SITE_OTHER): Payer: Medicare Other | Admitting: *Deleted

## 2013-09-28 DIAGNOSIS — I4891 Unspecified atrial fibrillation: Secondary | ICD-10-CM

## 2013-09-28 LAB — POCT INR: INR: 2

## 2013-09-30 ENCOUNTER — Other Ambulatory Visit: Payer: Self-pay

## 2013-09-30 MED ORDER — PRAVASTATIN SODIUM 80 MG PO TABS: 80.0000 mg | ORAL_TABLET | Freq: Every day | ORAL | Status: DC

## 2013-10-24 ENCOUNTER — Other Ambulatory Visit: Payer: Self-pay

## 2013-10-24 MED ORDER — METOPROLOL SUCCINATE ER 25 MG PO TB24: 25.0000 mg | ORAL_TABLET | Freq: Every day | ORAL | Status: DC

## 2013-11-02 ENCOUNTER — Ambulatory Visit (INDEPENDENT_AMBULATORY_CARE_PROVIDER_SITE_OTHER): Payer: Medicare Other | Admitting: *Deleted

## 2013-11-02 DIAGNOSIS — I4891 Unspecified atrial fibrillation: Secondary | ICD-10-CM

## 2013-11-02 DIAGNOSIS — Z5181 Encounter for therapeutic drug level monitoring: Secondary | ICD-10-CM | POA: Diagnosis not present

## 2013-11-02 LAB — POCT INR: INR: 1.9

## 2013-11-16 ENCOUNTER — Ambulatory Visit (INDEPENDENT_AMBULATORY_CARE_PROVIDER_SITE_OTHER): Payer: Medicare Other | Admitting: *Deleted

## 2013-11-16 DIAGNOSIS — Z5181 Encounter for therapeutic drug level monitoring: Secondary | ICD-10-CM

## 2013-11-16 DIAGNOSIS — I4891 Unspecified atrial fibrillation: Secondary | ICD-10-CM

## 2013-11-16 LAB — POCT INR: INR: 2.3

## 2013-12-07 ENCOUNTER — Ambulatory Visit (INDEPENDENT_AMBULATORY_CARE_PROVIDER_SITE_OTHER): Payer: Medicare Other | Admitting: Pharmacist

## 2013-12-07 DIAGNOSIS — I4891 Unspecified atrial fibrillation: Secondary | ICD-10-CM | POA: Diagnosis not present

## 2013-12-07 DIAGNOSIS — Z5181 Encounter for therapeutic drug level monitoring: Secondary | ICD-10-CM

## 2013-12-07 LAB — POCT INR: INR: 2.3

## 2013-12-13 ENCOUNTER — Other Ambulatory Visit: Payer: Self-pay | Admitting: *Deleted

## 2013-12-13 MED ORDER — LISINOPRIL 20 MG PO TABS: 20.0000 mg | ORAL_TABLET | Freq: Every day | ORAL | Status: DC

## 2013-12-28 ENCOUNTER — Ambulatory Visit: Payer: Self-pay | Admitting: Family Medicine

## 2013-12-28 DIAGNOSIS — J45909 Unspecified asthma, uncomplicated: Secondary | ICD-10-CM | POA: Diagnosis not present

## 2013-12-28 DIAGNOSIS — Z1339 Encounter for screening examination for other mental health and behavioral disorders: Secondary | ICD-10-CM | POA: Diagnosis not present

## 2013-12-28 DIAGNOSIS — J309 Allergic rhinitis, unspecified: Secondary | ICD-10-CM | POA: Diagnosis not present

## 2013-12-28 DIAGNOSIS — Z1331 Encounter for screening for depression: Secondary | ICD-10-CM | POA: Diagnosis not present

## 2013-12-28 DIAGNOSIS — R05 Cough: Secondary | ICD-10-CM | POA: Diagnosis not present

## 2013-12-28 DIAGNOSIS — R059 Cough, unspecified: Secondary | ICD-10-CM | POA: Diagnosis not present

## 2014-01-04 ENCOUNTER — Ambulatory Visit (INDEPENDENT_AMBULATORY_CARE_PROVIDER_SITE_OTHER): Payer: Medicare Other | Admitting: *Deleted

## 2014-01-04 DIAGNOSIS — Z5181 Encounter for therapeutic drug level monitoring: Secondary | ICD-10-CM

## 2014-01-04 DIAGNOSIS — I4891 Unspecified atrial fibrillation: Secondary | ICD-10-CM

## 2014-01-04 LAB — POCT INR: INR: 2.8

## 2014-01-23 ENCOUNTER — Other Ambulatory Visit: Payer: Self-pay | Admitting: *Deleted

## 2014-01-23 MED ORDER — WARFARIN SODIUM 5 MG PO TABS
ORAL_TABLET | ORAL | Status: DC
Start: 2014-01-23 — End: 2014-07-19

## 2014-02-01 ENCOUNTER — Ambulatory Visit (INDEPENDENT_AMBULATORY_CARE_PROVIDER_SITE_OTHER): Payer: Medicare Other | Admitting: *Deleted

## 2014-02-01 ENCOUNTER — Other Ambulatory Visit (INDEPENDENT_AMBULATORY_CARE_PROVIDER_SITE_OTHER): Payer: Medicare Other

## 2014-02-01 DIAGNOSIS — I4891 Unspecified atrial fibrillation: Secondary | ICD-10-CM

## 2014-02-01 DIAGNOSIS — Z5181 Encounter for therapeutic drug level monitoring: Secondary | ICD-10-CM | POA: Diagnosis not present

## 2014-02-01 DIAGNOSIS — E785 Hyperlipidemia, unspecified: Secondary | ICD-10-CM

## 2014-02-01 LAB — LIPID PANEL
CHOLESTEROL: 195 mg/dL (ref 0–200)
HDL: 68.5 mg/dL (ref >39.00)
LDL Cholesterol: 106 mg/dL — ABNORMAL HIGH (ref 0–99)
Total CHOL/HDL Ratio: 3
Triglycerides: 101 mg/dL (ref 0.0–149.0)
VLDL: 20.2 mg/dL (ref 0.0–40.0)

## 2014-02-01 LAB — HEPATIC FUNCTION PANEL
ALK PHOS: 44 U/L (ref 39–117)
ALT: 37 U/L (ref 0–53)
AST: 35 U/L (ref 0–37)
Albumin: 4.3 g/dL (ref 3.5–5.2)
BILIRUBIN DIRECT: 0.1 mg/dL (ref 0.0–0.3)
TOTAL PROTEIN: 6.5 g/dL (ref 6.0–8.3)
Total Bilirubin: 1.2 mg/dL (ref 0.2–1.2)

## 2014-02-01 LAB — POCT INR: INR: 2.2

## 2014-02-08 ENCOUNTER — Other Ambulatory Visit: Payer: Medicare Other

## 2014-02-08 ENCOUNTER — Telehealth: Payer: Self-pay | Admitting: Interventional Cardiology

## 2014-02-08 NOTE — Telephone Encounter (Signed)
Patient would like results of lab work. Please call and advise.  °

## 2014-02-08 NOTE — Telephone Encounter (Signed)
PRELIMINARY REVIEWED LABS  WITH PT  INFORMED  APPEARS  DR Tamala Julian HAS NOT REVIEWED   LISA  WILL CALL  WITH FINAL REPORT ONCE  REVIEWED  BY DR  Tamala Julian PT  VERBALIZED UNDERSTANDING./CY

## 2014-02-13 ENCOUNTER — Telehealth: Payer: Self-pay

## 2014-02-13 DIAGNOSIS — E785 Hyperlipidemia, unspecified: Secondary | ICD-10-CM

## 2014-02-13 NOTE — Telephone Encounter (Signed)
pt given lab results.Labs are slightly above target. Overall , tighten diet and exercise if possible and recheck in 1 year.pt verbalized understanding.

## 2014-02-13 NOTE — Telephone Encounter (Signed)
Message copied by Lamar Laundry on Mon Feb 13, 2014  9:37 AM ------      Message from: Daneen Schick      Created: Wed Feb 08, 2014  5:09 PM       Labs are slightly above target. Overall , tighten diet and exercise if possible and recheck in 1 year. ------

## 2014-02-21 DIAGNOSIS — L821 Other seborrheic keratosis: Secondary | ICD-10-CM | POA: Diagnosis not present

## 2014-02-21 DIAGNOSIS — D239 Other benign neoplasm of skin, unspecified: Secondary | ICD-10-CM | POA: Diagnosis not present

## 2014-02-21 DIAGNOSIS — Z85828 Personal history of other malignant neoplasm of skin: Secondary | ICD-10-CM | POA: Diagnosis not present

## 2014-02-21 DIAGNOSIS — L578 Other skin changes due to chronic exposure to nonionizing radiation: Secondary | ICD-10-CM | POA: Diagnosis not present

## 2014-02-21 DIAGNOSIS — L820 Inflamed seborrheic keratosis: Secondary | ICD-10-CM | POA: Diagnosis not present

## 2014-02-24 ENCOUNTER — Other Ambulatory Visit: Payer: Self-pay

## 2014-02-24 MED ORDER — LISINOPRIL 20 MG PO TABS: 20.0000 mg | ORAL_TABLET | Freq: Every day | ORAL | Status: DC

## 2014-03-08 ENCOUNTER — Ambulatory Visit (INDEPENDENT_AMBULATORY_CARE_PROVIDER_SITE_OTHER): Payer: Medicare Other | Admitting: *Deleted

## 2014-03-08 DIAGNOSIS — Z5181 Encounter for therapeutic drug level monitoring: Secondary | ICD-10-CM | POA: Diagnosis not present

## 2014-03-08 DIAGNOSIS — I4891 Unspecified atrial fibrillation: Secondary | ICD-10-CM

## 2014-03-08 LAB — POCT INR: INR: 2.6

## 2014-04-05 ENCOUNTER — Ambulatory Visit: Payer: Self-pay | Admitting: Family Medicine

## 2014-04-05 ENCOUNTER — Telehealth: Payer: Self-pay | Admitting: Interventional Cardiology

## 2014-04-05 DIAGNOSIS — H8110 Benign paroxysmal vertigo, unspecified ear: Secondary | ICD-10-CM | POA: Diagnosis not present

## 2014-04-05 DIAGNOSIS — R918 Other nonspecific abnormal finding of lung field: Secondary | ICD-10-CM | POA: Diagnosis not present

## 2014-04-05 DIAGNOSIS — J019 Acute sinusitis, unspecified: Secondary | ICD-10-CM | POA: Diagnosis not present

## 2014-04-05 DIAGNOSIS — R059 Cough, unspecified: Secondary | ICD-10-CM | POA: Diagnosis not present

## 2014-04-05 DIAGNOSIS — J309 Allergic rhinitis, unspecified: Secondary | ICD-10-CM | POA: Diagnosis not present

## 2014-04-05 DIAGNOSIS — Z1331 Encounter for screening for depression: Secondary | ICD-10-CM | POA: Diagnosis not present

## 2014-04-05 NOTE — Telephone Encounter (Signed)
New message     Talk to someone in the coumadin clinic---he is on a new medication and want to tell you about it.

## 2014-04-05 NOTE — Telephone Encounter (Signed)
Spoke with pt.  Started on fluticasone nasal spray and Augmentin x 10 days today.  Fluticasone will be fine with coumadin and Augmentin may increase INR slightly if he has GI upset.  Asked pt to eat 1-2 more servings of greens while on abx and keep follow up as scheduled for next week on 8/5.

## 2014-04-12 ENCOUNTER — Ambulatory Visit (INDEPENDENT_AMBULATORY_CARE_PROVIDER_SITE_OTHER): Payer: Medicare Other | Admitting: Pharmacist

## 2014-04-12 DIAGNOSIS — I4891 Unspecified atrial fibrillation: Secondary | ICD-10-CM

## 2014-04-12 DIAGNOSIS — Z5181 Encounter for therapeutic drug level monitoring: Secondary | ICD-10-CM | POA: Diagnosis not present

## 2014-04-12 LAB — POCT INR: INR: 3.4

## 2014-05-03 ENCOUNTER — Ambulatory Visit (INDEPENDENT_AMBULATORY_CARE_PROVIDER_SITE_OTHER): Payer: Medicare Other | Admitting: *Deleted

## 2014-05-03 DIAGNOSIS — I4891 Unspecified atrial fibrillation: Secondary | ICD-10-CM | POA: Diagnosis not present

## 2014-05-03 DIAGNOSIS — Z5181 Encounter for therapeutic drug level monitoring: Secondary | ICD-10-CM | POA: Diagnosis not present

## 2014-05-03 LAB — POCT INR: INR: 3.6

## 2014-05-17 ENCOUNTER — Ambulatory Visit (INDEPENDENT_AMBULATORY_CARE_PROVIDER_SITE_OTHER): Payer: Medicare Other | Admitting: *Deleted

## 2014-05-17 DIAGNOSIS — I4891 Unspecified atrial fibrillation: Secondary | ICD-10-CM

## 2014-05-17 DIAGNOSIS — Z5181 Encounter for therapeutic drug level monitoring: Secondary | ICD-10-CM | POA: Diagnosis not present

## 2014-05-17 LAB — POCT INR: INR: 1.7

## 2014-05-24 DIAGNOSIS — Z23 Encounter for immunization: Secondary | ICD-10-CM | POA: Diagnosis not present

## 2014-05-31 ENCOUNTER — Ambulatory Visit (INDEPENDENT_AMBULATORY_CARE_PROVIDER_SITE_OTHER): Payer: Medicare Other | Admitting: *Deleted

## 2014-05-31 DIAGNOSIS — I4891 Unspecified atrial fibrillation: Secondary | ICD-10-CM

## 2014-05-31 DIAGNOSIS — Z5181 Encounter for therapeutic drug level monitoring: Secondary | ICD-10-CM

## 2014-05-31 LAB — POCT INR: INR: 2.1

## 2014-06-20 ENCOUNTER — Ambulatory Visit (INDEPENDENT_AMBULATORY_CARE_PROVIDER_SITE_OTHER): Payer: Medicare Other | Admitting: Pharmacist

## 2014-06-20 DIAGNOSIS — I4891 Unspecified atrial fibrillation: Secondary | ICD-10-CM

## 2014-06-20 DIAGNOSIS — Z5181 Encounter for therapeutic drug level monitoring: Secondary | ICD-10-CM

## 2014-06-20 LAB — POCT INR: INR: 3

## 2014-06-21 DIAGNOSIS — N400 Enlarged prostate without lower urinary tract symptoms: Secondary | ICD-10-CM | POA: Diagnosis not present

## 2014-06-21 DIAGNOSIS — R312 Other microscopic hematuria: Secondary | ICD-10-CM | POA: Diagnosis not present

## 2014-07-11 DIAGNOSIS — M4726 Other spondylosis with radiculopathy, lumbar region: Secondary | ICD-10-CM | POA: Diagnosis not present

## 2014-07-11 DIAGNOSIS — M5442 Lumbago with sciatica, left side: Secondary | ICD-10-CM | POA: Diagnosis not present

## 2014-07-11 DIAGNOSIS — Z981 Arthrodesis status: Secondary | ICD-10-CM | POA: Diagnosis not present

## 2014-07-11 DIAGNOSIS — M546 Pain in thoracic spine: Secondary | ICD-10-CM | POA: Diagnosis not present

## 2014-07-11 DIAGNOSIS — G5602 Carpal tunnel syndrome, left upper limb: Secondary | ICD-10-CM | POA: Diagnosis not present

## 2014-07-11 DIAGNOSIS — Z6827 Body mass index (BMI) 27.0-27.9, adult: Secondary | ICD-10-CM | POA: Diagnosis not present

## 2014-07-11 DIAGNOSIS — M5136 Other intervertebral disc degeneration, lumbar region: Secondary | ICD-10-CM | POA: Diagnosis not present

## 2014-07-11 DIAGNOSIS — M549 Dorsalgia, unspecified: Secondary | ICD-10-CM | POA: Diagnosis not present

## 2014-07-13 ENCOUNTER — Other Ambulatory Visit: Payer: Self-pay

## 2014-07-13 MED ORDER — METOPROLOL SUCCINATE ER 25 MG PO TB24: 25.0000 mg | ORAL_TABLET | Freq: Every day | ORAL | Status: DC

## 2014-07-14 NOTE — Telephone Encounter (Signed)
No notes

## 2014-07-19 ENCOUNTER — Ambulatory Visit (INDEPENDENT_AMBULATORY_CARE_PROVIDER_SITE_OTHER): Payer: Medicare Other | Admitting: *Deleted

## 2014-07-19 DIAGNOSIS — I4891 Unspecified atrial fibrillation: Secondary | ICD-10-CM

## 2014-07-19 DIAGNOSIS — M4726 Other spondylosis with radiculopathy, lumbar region: Secondary | ICD-10-CM | POA: Diagnosis not present

## 2014-07-19 DIAGNOSIS — Z5181 Encounter for therapeutic drug level monitoring: Secondary | ICD-10-CM | POA: Diagnosis not present

## 2014-07-19 LAB — POCT INR: INR: 2.5

## 2014-07-19 MED ORDER — WARFARIN SODIUM 5 MG PO TABS: ORAL_TABLET | ORAL | Status: DC

## 2014-07-21 DIAGNOSIS — M4726 Other spondylosis with radiculopathy, lumbar region: Secondary | ICD-10-CM | POA: Diagnosis not present

## 2014-07-21 DIAGNOSIS — Z981 Arthrodesis status: Secondary | ICD-10-CM | POA: Diagnosis not present

## 2014-07-27 DIAGNOSIS — Z6827 Body mass index (BMI) 27.0-27.9, adult: Secondary | ICD-10-CM | POA: Diagnosis not present

## 2014-07-27 DIAGNOSIS — M4726 Other spondylosis with radiculopathy, lumbar region: Secondary | ICD-10-CM | POA: Diagnosis not present

## 2014-07-27 DIAGNOSIS — G039 Meningitis, unspecified: Secondary | ICD-10-CM | POA: Diagnosis not present

## 2014-07-27 DIAGNOSIS — Z981 Arthrodesis status: Secondary | ICD-10-CM | POA: Diagnosis not present

## 2014-07-27 DIAGNOSIS — M5442 Lumbago with sciatica, left side: Secondary | ICD-10-CM | POA: Diagnosis not present

## 2014-07-27 DIAGNOSIS — G5602 Carpal tunnel syndrome, left upper limb: Secondary | ICD-10-CM | POA: Diagnosis not present

## 2014-07-27 DIAGNOSIS — M5136 Other intervertebral disc degeneration, lumbar region: Secondary | ICD-10-CM | POA: Diagnosis not present

## 2014-07-28 ENCOUNTER — Other Ambulatory Visit: Payer: Self-pay | Admitting: Interventional Cardiology

## 2014-08-16 ENCOUNTER — Ambulatory Visit (INDEPENDENT_AMBULATORY_CARE_PROVIDER_SITE_OTHER): Payer: Medicare Other | Admitting: *Deleted

## 2014-08-16 DIAGNOSIS — Z5181 Encounter for therapeutic drug level monitoring: Secondary | ICD-10-CM

## 2014-08-16 DIAGNOSIS — I4891 Unspecified atrial fibrillation: Secondary | ICD-10-CM

## 2014-08-16 LAB — POCT INR: INR: 2.5

## 2014-08-23 ENCOUNTER — Ambulatory Visit: Payer: Medicare Other | Admitting: Interventional Cardiology

## 2014-08-31 ENCOUNTER — Other Ambulatory Visit: Payer: Self-pay | Admitting: *Deleted

## 2014-08-31 MED ORDER — PRAVASTATIN SODIUM 80 MG PO TABS
80.0000 mg | ORAL_TABLET | Freq: Every day | ORAL | Status: DC
Start: 2014-08-31 — End: 2016-03-26

## 2014-09-10 ENCOUNTER — Other Ambulatory Visit: Payer: Self-pay | Admitting: Interventional Cardiology

## 2014-09-12 DIAGNOSIS — Z981 Arthrodesis status: Secondary | ICD-10-CM | POA: Diagnosis not present

## 2014-09-12 DIAGNOSIS — M5136 Other intervertebral disc degeneration, lumbar region: Secondary | ICD-10-CM | POA: Diagnosis not present

## 2014-09-12 DIAGNOSIS — I10 Essential (primary) hypertension: Secondary | ICD-10-CM | POA: Diagnosis not present

## 2014-09-12 DIAGNOSIS — M4726 Other spondylosis with radiculopathy, lumbar region: Secondary | ICD-10-CM | POA: Diagnosis not present

## 2014-09-12 DIAGNOSIS — Z6827 Body mass index (BMI) 27.0-27.9, adult: Secondary | ICD-10-CM | POA: Diagnosis not present

## 2014-09-12 DIAGNOSIS — M5442 Lumbago with sciatica, left side: Secondary | ICD-10-CM | POA: Diagnosis not present

## 2014-09-13 ENCOUNTER — Other Ambulatory Visit: Payer: Self-pay

## 2014-09-13 MED ORDER — EPLERENONE 25 MG PO TABS: 25.0000 mg | ORAL_TABLET | Freq: Every day | ORAL | Status: DC

## 2014-09-27 ENCOUNTER — Ambulatory Visit (INDEPENDENT_AMBULATORY_CARE_PROVIDER_SITE_OTHER): Payer: Medicare Other | Admitting: *Deleted

## 2014-09-27 DIAGNOSIS — I4891 Unspecified atrial fibrillation: Secondary | ICD-10-CM

## 2014-09-27 DIAGNOSIS — Z5181 Encounter for therapeutic drug level monitoring: Secondary | ICD-10-CM

## 2014-09-27 LAB — POCT INR: INR: 2.1

## 2014-10-02 ENCOUNTER — Ambulatory Visit: Payer: Medicare Other | Admitting: Interventional Cardiology

## 2014-10-05 ENCOUNTER — Encounter: Payer: Self-pay | Admitting: Interventional Cardiology

## 2014-10-05 ENCOUNTER — Ambulatory Visit (INDEPENDENT_AMBULATORY_CARE_PROVIDER_SITE_OTHER): Payer: Medicare Other | Admitting: Interventional Cardiology

## 2014-10-05 VITALS — BP 126/82 | HR 66 | Ht 68.0 in | Wt 183.2 lb

## 2014-10-05 DIAGNOSIS — Z5181 Encounter for therapeutic drug level monitoring: Secondary | ICD-10-CM | POA: Diagnosis not present

## 2014-10-05 DIAGNOSIS — I10 Essential (primary) hypertension: Secondary | ICD-10-CM | POA: Diagnosis not present

## 2014-10-05 DIAGNOSIS — I4891 Unspecified atrial fibrillation: Secondary | ICD-10-CM | POA: Diagnosis not present

## 2014-10-05 DIAGNOSIS — I2510 Atherosclerotic heart disease of native coronary artery without angina pectoris: Secondary | ICD-10-CM

## 2014-10-05 DIAGNOSIS — E785 Hyperlipidemia, unspecified: Secondary | ICD-10-CM | POA: Diagnosis not present

## 2014-10-05 NOTE — Patient Instructions (Signed)
Your physician recommends that you continue on your current medications as directed. Please refer to the Current Medication list given to you today.  Your physician wants you to follow-up in: 1 year with Dr.Smith You will receive a reminder letter in the mail two months in advance. If you don't receive a letter, please call our office to schedule the follow-up appointment.  

## 2014-10-05 NOTE — Progress Notes (Signed)
Patient ID: Larry Bautista, male   DOB: February 11, 1941, 74 y.o.   MRN: [id number]    Cardiology Office Note   Date:  10/05/2014   ID:  Larry Bautista, DOB [birth year], MRN [id number]  PCP:  No primary care provider on file.  Cardiologist:   Sinclair Grooms, MD   Chief Complaint  Patient presents with  . Follow-up    leg pain      History of Present Illness: Larry Bautista is a 74 y.o. male who presents for coronary artery disease follow-up. He has chronic atrial fibrillation and is on anticoagulation therapy. He is still troubled by exertional dyspnea. There is no real change in the magnitude of his symptoms compared to last year when I saw him. He has not had syncope. He has occasional wheezing. He denies angina. He has not used nitroglycerin. He denies peripheral edema.    Past Medical History  Diagnosis Date  . Persistent atrial fibrillation     longstanding persistent (since 05/2008)  . Coronary artery disease     with LAD DE stent 2004  . Hypertension   . Hyperlipidemia   . Migraine   . Kidney stones   . Chronic knee pain   . Lower back pain     status post surgery for spondylolisthesis    Past Surgical History  Procedure Laterality Date  . Back surgery      spondylolisthesis  . Coronary angioplasty with stent placement  04/2003    CYPHER stent implantation in the LAD  . Cardiac catheterization  03/06/2003     Current Outpatient Prescriptions  Medication Sig Dispense Refill  . aspirin 81 MG tablet Take 81 mg by mouth daily.    . diphenhydramine-acetaminophen (TYLENOL PM) 25-500 MG TABS Take 1 tablet by mouth at bedtime as needed.    [name] eplerenone (INSPRA) 25 MG tablet Take 1 tablet (25 mg total) by mouth daily. 30 tablet 0  . fish oil-omega-3 fatty acids 1000 MG capsule Take 1 g by mouth daily.    . fluticasone (FLONASE) 50 MCG/ACT nasal spray Place 2 sprays into both nostrils daily.  11  . gabapentin (NEURONTIN) 300 MG capsule Take 300 mg by mouth 3 (three)  times daily.    [name] lisinopril (PRINIVIL,ZESTRIL) 20 MG tablet Take 1 tablet (20 mg total) by mouth daily. 90 tablet 1  . meclizine (ANTIVERT) 25 MG tablet Take 25 mg by mouth 3 (three) times daily as needed for dizziness.    . metoprolol succinate (TOPROL-XL) 25 MG 24 hr tablet Take 1 tablet (25 mg total) by mouth daily. 90 tablet 0  . multivitamin (THERAGRAN) per tablet Take 1 tablet by mouth daily.    . nitroGLYCERIN (NITROSTAT) 0.4 MG SL tablet Place 0.4 mg under the tongue every 5 (five) minutes as needed.    . pravastatin (PRAVACHOL) 80 MG tablet Take 1 tablet (80 mg total) by mouth daily. 90 tablet 4  . psyllium (METAMUCIL) 58.6 % powder Take 1 packet by mouth as needed.    . vitamin C (ASCORBIC ACID) 500 MG tablet Take 500 mg by mouth daily.    . VOLTAREN 1 % GEL As directed    . warfarin (COUMADIN) 5 MG tablet TAKE AS DIRECTED BY COUMADIN CLINIC 60 tablet 1   No current facility-administered medications for this visit.    Allergies:   Simvastatin and Celecoxib    Social History:  The patient  reports that he quit smoking about 17 years ago. His  smoking use included Cigarettes. He does not have any smokeless tobacco history on file. He reports that he does not drink alcohol or use illicit drugs.   Family History:  The patient's family history includes Atrial fibrillation in his brother and brother; Cancer in his father; Coronary artery disease in his brother; Heart attack in his father.    ROS:  Please see the history of present illness.   Otherwise, review of systems are positive for hematuria. Has bilateral  Leg pain when he lies down. Has history of kidney stones.  Has snoring when sleeps on back.All other systems are reviewed and negative.    PHYSICAL EXAM: VS:  BP 126/82 mmHg  Pulse 66  Ht 5\' 8"  (1.727 m)  Wt 183 lb 3.2 oz (83.099 kg)  BMI 27.86 kg/m2 , BMI Body mass index is 27.86 kg/(m^2). GEN: Well nourished, well developed, in no acute distress HEENT: normal Neck: no  JVD, carotid bruits, or masses Cardiac: IIRR; no murmurs, rubs, or gallops,no edema  Respiratory:  clear to auscultation bilaterally, normal work of breathing GI: soft, nontender, nondistended, + BS MS: no deformity or atrophy Skin: warm and dry, no rash Neuro:  Strength and sensation are intact Psych: euthymic mood, full affect   EKG:  EKG is ordered today. The ekg ordered today demonstrates atrial fib with controlled rate.   Recent Labs: 02/01/2014: ALT 37    Lipid Panel    Component Value Date/Time   CHOL 195 02/01/2014 0949   TRIG 101.0 02/01/2014 0949   HDL 68.50 02/01/2014 0949   CHOLHDL 3 02/01/2014 0949   VLDL 20.2 02/01/2014 0949   LDLCALC 106* 02/01/2014 0949   LDLDIRECT 136.6 08/24/2013 0938      Wt Readings from Last 3 Encounters:  10/05/14 183 lb 3.2 oz (83.099 kg)  08/24/13 180 lb 12.8 oz (82.01 kg)  11/10/11 176 lb 1.9 oz (79.888 kg)      Other studies Reviewed: Additional studies/ records that were reviewed today include: None. Review of the above records demonstrates: None   ASSESSMENT AND PLAN:  1.  Coronary artery disease with prior LAD coronary stent, asymptomatic with reference to angina. The stem was documented to be patent by coronary angiography in 2005. 2. Chronic atrial fibrillation with good rate control. 3. Chronic anticoagulation therapy without bleeding complications. 4. Essential hypertension, controlled without complications 5. Hyperlipidemia   Current medicines are reviewed at length with the patient today.  The patient does not have concerns regarding medicines.  The following changes have been made:  no change  Labs/ tests ordered today include: None  No orders of the defined types were placed in this encounter.     Disposition:   FU with Linard Millers in one year.  Signed, Sinclair Grooms, MD  10/05/2014 1:10 PM    [facility] [address], [place], [place]  [address] Phone: [phone]; Fax:  [phone]

## 2014-10-11 ENCOUNTER — Other Ambulatory Visit: Payer: Self-pay | Admitting: Interventional Cardiology

## 2014-10-12 ENCOUNTER — Other Ambulatory Visit: Payer: Self-pay

## 2014-10-12 MED ORDER — METOPROLOL SUCCINATE ER 25 MG PO TB24: 25.0000 mg | ORAL_TABLET | Freq: Every day | ORAL | Status: DC

## 2014-10-17 ENCOUNTER — Other Ambulatory Visit: Payer: Self-pay | Admitting: Interventional Cardiology

## 2014-10-18 DIAGNOSIS — M4806 Spinal stenosis, lumbar region: Secondary | ICD-10-CM | POA: Diagnosis not present

## 2014-10-18 DIAGNOSIS — M25562 Pain in left knee: Secondary | ICD-10-CM | POA: Diagnosis not present

## 2014-10-25 DIAGNOSIS — M5106 Intervertebral disc disorders with myelopathy, lumbar region: Secondary | ICD-10-CM | POA: Diagnosis not present

## 2014-10-25 DIAGNOSIS — M5116 Intervertebral disc disorders with radiculopathy, lumbar region: Secondary | ICD-10-CM | POA: Diagnosis not present

## 2014-10-25 DIAGNOSIS — M545 Low back pain: Secondary | ICD-10-CM | POA: Diagnosis not present

## 2014-11-01 DIAGNOSIS — M5106 Intervertebral disc disorders with myelopathy, lumbar region: Secondary | ICD-10-CM | POA: Diagnosis not present

## 2014-11-01 DIAGNOSIS — M545 Low back pain: Secondary | ICD-10-CM | POA: Diagnosis not present

## 2014-11-01 DIAGNOSIS — M5116 Intervertebral disc disorders with radiculopathy, lumbar region: Secondary | ICD-10-CM | POA: Diagnosis not present

## 2014-11-07 ENCOUNTER — Ambulatory Visit (INDEPENDENT_AMBULATORY_CARE_PROVIDER_SITE_OTHER): Payer: Medicare Other | Admitting: Pharmacist

## 2014-11-07 DIAGNOSIS — Z981 Arthrodesis status: Secondary | ICD-10-CM | POA: Diagnosis not present

## 2014-11-07 DIAGNOSIS — M549 Dorsalgia, unspecified: Secondary | ICD-10-CM | POA: Diagnosis not present

## 2014-11-07 DIAGNOSIS — Z6827 Body mass index (BMI) 27.0-27.9, adult: Secondary | ICD-10-CM | POA: Diagnosis not present

## 2014-11-07 DIAGNOSIS — Z5181 Encounter for therapeutic drug level monitoring: Secondary | ICD-10-CM | POA: Diagnosis not present

## 2014-11-07 DIAGNOSIS — I4891 Unspecified atrial fibrillation: Secondary | ICD-10-CM | POA: Diagnosis not present

## 2014-11-07 DIAGNOSIS — M4726 Other spondylosis with radiculopathy, lumbar region: Secondary | ICD-10-CM | POA: Diagnosis not present

## 2014-11-07 DIAGNOSIS — G039 Meningitis, unspecified: Secondary | ICD-10-CM | POA: Diagnosis not present

## 2014-11-07 DIAGNOSIS — M5136 Other intervertebral disc degeneration, lumbar region: Secondary | ICD-10-CM | POA: Diagnosis not present

## 2014-11-07 LAB — POCT INR: INR: 2.3

## 2014-11-15 DIAGNOSIS — M5116 Intervertebral disc disorders with radiculopathy, lumbar region: Secondary | ICD-10-CM | POA: Diagnosis not present

## 2014-11-15 DIAGNOSIS — M5106 Intervertebral disc disorders with myelopathy, lumbar region: Secondary | ICD-10-CM | POA: Diagnosis not present

## 2014-11-15 DIAGNOSIS — M545 Low back pain: Secondary | ICD-10-CM | POA: Diagnosis not present

## 2014-11-22 DIAGNOSIS — M5116 Intervertebral disc disorders with radiculopathy, lumbar region: Secondary | ICD-10-CM | POA: Diagnosis not present

## 2014-11-22 DIAGNOSIS — M5106 Intervertebral disc disorders with myelopathy, lumbar region: Secondary | ICD-10-CM | POA: Diagnosis not present

## 2014-11-22 DIAGNOSIS — M545 Low back pain: Secondary | ICD-10-CM | POA: Diagnosis not present

## 2014-12-20 ENCOUNTER — Ambulatory Visit (INDEPENDENT_AMBULATORY_CARE_PROVIDER_SITE_OTHER): Payer: Medicare Other | Admitting: *Deleted

## 2014-12-20 DIAGNOSIS — M19011 Primary osteoarthritis, right shoulder: Secondary | ICD-10-CM | POA: Diagnosis not present

## 2014-12-20 DIAGNOSIS — Z5181 Encounter for therapeutic drug level monitoring: Secondary | ICD-10-CM | POA: Diagnosis not present

## 2014-12-20 DIAGNOSIS — I4891 Unspecified atrial fibrillation: Secondary | ICD-10-CM

## 2014-12-20 DIAGNOSIS — M25511 Pain in right shoulder: Secondary | ICD-10-CM | POA: Diagnosis not present

## 2014-12-20 LAB — POCT INR: INR: 2.7

## 2015-01-24 ENCOUNTER — Ambulatory Visit (INDEPENDENT_AMBULATORY_CARE_PROVIDER_SITE_OTHER): Payer: Medicare Other | Admitting: *Deleted

## 2015-01-24 ENCOUNTER — Other Ambulatory Visit (INDEPENDENT_AMBULATORY_CARE_PROVIDER_SITE_OTHER): Payer: Medicare Other | Admitting: *Deleted

## 2015-01-24 DIAGNOSIS — Z6829 Body mass index (BMI) 29.0-29.9, adult: Secondary | ICD-10-CM | POA: Insufficient documentation

## 2015-01-24 DIAGNOSIS — E785 Hyperlipidemia, unspecified: Secondary | ICD-10-CM | POA: Diagnosis not present

## 2015-01-24 DIAGNOSIS — J309 Allergic rhinitis, unspecified: Secondary | ICD-10-CM | POA: Insufficient documentation

## 2015-01-24 DIAGNOSIS — Z5181 Encounter for therapeutic drug level monitoring: Secondary | ICD-10-CM

## 2015-01-24 DIAGNOSIS — M4800 Spinal stenosis, site unspecified: Secondary | ICD-10-CM | POA: Insufficient documentation

## 2015-01-24 DIAGNOSIS — M1990 Unspecified osteoarthritis, unspecified site: Secondary | ICD-10-CM | POA: Insufficient documentation

## 2015-01-24 DIAGNOSIS — N400 Enlarged prostate without lower urinary tract symptoms: Secondary | ICD-10-CM | POA: Insufficient documentation

## 2015-01-24 DIAGNOSIS — M4310 Spondylolisthesis, site unspecified: Secondary | ICD-10-CM | POA: Insufficient documentation

## 2015-01-24 DIAGNOSIS — I480 Paroxysmal atrial fibrillation: Secondary | ICD-10-CM | POA: Insufficient documentation

## 2015-01-24 DIAGNOSIS — G43909 Migraine, unspecified, not intractable, without status migrainosus: Secondary | ICD-10-CM | POA: Insufficient documentation

## 2015-01-24 DIAGNOSIS — I4891 Unspecified atrial fibrillation: Secondary | ICD-10-CM | POA: Diagnosis not present

## 2015-01-24 DIAGNOSIS — H8110 Benign paroxysmal vertigo, unspecified ear: Secondary | ICD-10-CM | POA: Insufficient documentation

## 2015-01-24 DIAGNOSIS — C4491 Basal cell carcinoma of skin, unspecified: Secondary | ICD-10-CM | POA: Insufficient documentation

## 2015-01-24 DIAGNOSIS — Z87442 Personal history of urinary calculi: Secondary | ICD-10-CM | POA: Insufficient documentation

## 2015-01-24 LAB — LIPID PANEL
Cholesterol: 170 mg/dL (ref 0–200)
HDL: 59.9 mg/dL (ref >39.00)
LDL Cholesterol: 83 mg/dL (ref 0–99)
NONHDL: 110.1
Total CHOL/HDL Ratio: 3
Triglycerides: 134 mg/dL (ref 0.0–149.0)
VLDL: 26.8 mg/dL (ref 0.0–40.0)

## 2015-01-24 LAB — ALT: ALT: 27 U/L (ref 0–53)

## 2015-01-24 LAB — POCT INR: INR: 2.2

## 2015-01-26 ENCOUNTER — Other Ambulatory Visit: Payer: Self-pay

## 2015-01-26 DIAGNOSIS — E785 Hyperlipidemia, unspecified: Secondary | ICD-10-CM

## 2015-02-06 DIAGNOSIS — I480 Paroxysmal atrial fibrillation: Secondary | ICD-10-CM | POA: Diagnosis not present

## 2015-02-06 DIAGNOSIS — N501 Vascular disorders of male genital organs: Secondary | ICD-10-CM | POA: Diagnosis not present

## 2015-02-21 DIAGNOSIS — N501 Vascular disorders of male genital organs: Secondary | ICD-10-CM | POA: Diagnosis not present

## 2015-02-21 DIAGNOSIS — D1800 Hemangioma unspecified site: Secondary | ICD-10-CM | POA: Diagnosis not present

## 2015-02-21 DIAGNOSIS — D485 Neoplasm of uncertain behavior of skin: Secondary | ICD-10-CM | POA: Diagnosis not present

## 2015-02-21 DIAGNOSIS — R58 Hemorrhage, not elsewhere classified: Secondary | ICD-10-CM | POA: Diagnosis not present

## 2015-03-07 ENCOUNTER — Ambulatory Visit (INDEPENDENT_AMBULATORY_CARE_PROVIDER_SITE_OTHER): Payer: Medicare Other | Admitting: Pharmacist

## 2015-03-07 DIAGNOSIS — I4891 Unspecified atrial fibrillation: Secondary | ICD-10-CM | POA: Diagnosis not present

## 2015-03-07 DIAGNOSIS — Z5181 Encounter for therapeutic drug level monitoring: Secondary | ICD-10-CM | POA: Diagnosis not present

## 2015-03-07 LAB — POCT INR: INR: 2.4

## 2015-03-13 DIAGNOSIS — M5136 Other intervertebral disc degeneration, lumbar region: Secondary | ICD-10-CM | POA: Diagnosis not present

## 2015-03-13 DIAGNOSIS — Z981 Arthrodesis status: Secondary | ICD-10-CM | POA: Diagnosis not present

## 2015-03-13 DIAGNOSIS — G039 Meningitis, unspecified: Secondary | ICD-10-CM | POA: Diagnosis not present

## 2015-03-13 DIAGNOSIS — M4726 Other spondylosis with radiculopathy, lumbar region: Secondary | ICD-10-CM | POA: Diagnosis not present

## 2015-03-13 DIAGNOSIS — I10 Essential (primary) hypertension: Secondary | ICD-10-CM | POA: Diagnosis not present

## 2015-03-13 DIAGNOSIS — Z6828 Body mass index (BMI) 28.0-28.9, adult: Secondary | ICD-10-CM | POA: Diagnosis not present

## 2015-03-28 ENCOUNTER — Encounter: Payer: Self-pay | Admitting: Family Medicine

## 2015-03-28 ENCOUNTER — Ambulatory Visit (INDEPENDENT_AMBULATORY_CARE_PROVIDER_SITE_OTHER): Payer: Medicare Other | Admitting: Family Medicine

## 2015-03-28 VITALS — BP 106/66 | HR 74 | Temp 98.1°F | Resp 16 | Ht 68.0 in | Wt 183.0 lb

## 2015-03-28 DIAGNOSIS — I480 Paroxysmal atrial fibrillation: Secondary | ICD-10-CM

## 2015-03-28 DIAGNOSIS — L814 Other melanin hyperpigmentation: Secondary | ICD-10-CM | POA: Diagnosis not present

## 2015-03-28 DIAGNOSIS — Z1283 Encounter for screening for malignant neoplasm of skin: Secondary | ICD-10-CM | POA: Diagnosis not present

## 2015-03-28 DIAGNOSIS — Z23 Encounter for immunization: Secondary | ICD-10-CM | POA: Diagnosis not present

## 2015-03-28 DIAGNOSIS — I10 Essential (primary) hypertension: Secondary | ICD-10-CM | POA: Diagnosis not present

## 2015-03-28 DIAGNOSIS — Z0000 Encounter for general adult medical examination without abnormal findings: Secondary | ICD-10-CM

## 2015-03-28 DIAGNOSIS — Z85828 Personal history of other malignant neoplasm of skin: Secondary | ICD-10-CM | POA: Diagnosis not present

## 2015-03-28 DIAGNOSIS — R739 Hyperglycemia, unspecified: Secondary | ICD-10-CM | POA: Diagnosis not present

## 2015-03-28 DIAGNOSIS — L821 Other seborrheic keratosis: Secondary | ICD-10-CM | POA: Diagnosis not present

## 2015-03-28 DIAGNOSIS — L578 Other skin changes due to chronic exposure to nonionizing radiation: Secondary | ICD-10-CM | POA: Diagnosis not present

## 2015-03-28 DIAGNOSIS — D229 Melanocytic nevi, unspecified: Secondary | ICD-10-CM | POA: Diagnosis not present

## 2015-03-28 DIAGNOSIS — D1800 Hemangioma unspecified site: Secondary | ICD-10-CM | POA: Diagnosis not present

## 2015-03-28 NOTE — Progress Notes (Signed)
Patient ID: Larry Bautista, male   DOB: September 18, 1940, 74 y.o.   MRN: [id number]       Patient: Larry Bautista, Male    DOB: 1941/09/05, 74 y.o.   MRN: [id number] Visit Date: 03/28/2015  Today's Provider: Margarita Rana, MD   Chief Complaint  Patient presents with  . Medicare Wellness   Subjective:    Annual wellness visit Larry Bautista is a 74 y.o. male who presents today for his Subsequent Annual Wellness Visit. He feels well. He reports not exercising, but stays active at home. He reports he is sleeping well. Recently go married.  Doing well.  11/17/12 CPE 2011 Colonoscopy per pt-normal   Lab Results  Component Value Date   CHOL 170 01/24/2015   HDL 59.90 01/24/2015   LDLCALC 83 01/24/2015   LDLDIRECT 136.6 08/24/2013   TRIG 134.0 01/24/2015   CHOLHDL 3 01/24/2015   Lab Results  Component Value Date   ALT 27 01/24/2015     -----------------------------------------------------------   Review of Systems  Constitutional: Negative.   HENT: Positive for tinnitus.   Eyes: Negative.   Respiratory: Positive for shortness of breath.   Cardiovascular: Negative.   Gastrointestinal: Positive for constipation and abdominal distention.  Endocrine: Negative.   Genitourinary: Negative.   Musculoskeletal: Positive for joint swelling, arthralgias and neck pain.  Skin: Negative.   Allergic/Immunologic: Negative.   Neurological: Negative.   Hematological: Negative.   Psychiatric/Behavioral: Negative.     History   Social History  . Marital Status: Married    Spouse Name: Geraldine Tesar  . Number of Children: 1  . Years of Education: N/A   Occupational History  .      works part-time Optician, dispensing   Social History Main Topics  . Smoking status: Former Smoker    Types: Cigarettes    Quit date: 11/06/1996  . Smokeless tobacco: Never Used  . Alcohol Use: No  . Drug Use: No  . Sexual Activity: Not on file   Other Topics Concern  . Not on file    Social History Narrative    Patient Active Problem List   Diagnosis Date Noted  . Hyperglycemia 03/28/2015  . Allergic rhinitis 01/24/2015  . Arthritis 01/24/2015  . Basal cell carcinoma 01/24/2015  . Benign fibroma of prostate 01/24/2015  . Body mass index 27.0-27.9, adult 01/24/2015  . Arteriosclerosis of coronary artery 01/24/2015  . Narrowing of intervertebral disc space 01/24/2015  . H/O renal calculi 01/24/2015  . BP (high blood pressure) 01/24/2015  . Headache, migraine 01/24/2015  . AF (paroxysmal atrial fibrillation) 01/24/2015  . Spinal stenosis 01/24/2015  . SPL (spondylolisthesis) 01/24/2015  . Benign paroxysmal positional nystagmus 01/24/2015  . Encounter for therapeutic drug monitoring 11/02/2013  . ATRIAL FIBRILLATION 05/04/2009  . Hyperlipidemia 05/03/2009  . MIGRAINE HEADACHE 05/03/2009  . Essential hypertension 05/03/2009  . CAD in native artery 05/03/2009  . KNEE PAIN 05/03/2009  . BACK PAIN 05/03/2009  . CHEST PAIN, EXERTIONAL 05/03/2009  . RENAL CALCULUS, HX OF 05/03/2009  . TOBACCO ABUSE, HX OF 05/03/2009    Past Surgical History  Procedure Laterality Date  . Back surgery      spondylolisthesis  . Coronary angioplasty with stent placement  04/2003    CYPHER stent implantation in the LAD  . Cardiac catheterization  03/06/2003  . Skin cancer excision  1980's    Face  . Vascular surgery      Vascular Stent  . Hand surgery Right 2014  carpal tunnel    His family history includes Atrial fibrillation in his brother and brother; COPD in his brother; Colon cancer in his father; Congestive Heart Failure in his brother and father; Heart attack in his father; Lung cancer in his brother and mother; Stroke in his brother.    Previous Medications   ASPIRIN 81 MG TABLET    Take 81 mg by mouth daily.   DIPHENHYDRAMINE-ACETAMINOPHEN (TYLENOL PM) 25-500 MG TABS    Take 1 tablet by mouth at bedtime as needed.   EPLERENONE (INSPRA) 25 MG TABLET    TAKE 1  TABLET (25 MG TOTAL) BY MOUTH DAILY.   FISH OIL-OMEGA-3 FATTY ACIDS 1000 MG CAPSULE    Take 2 capsules by mouth daily.    FLUTICASONE (FLONASE) 50 MCG/ACT NASAL SPRAY    Place 2 sprays into both nostrils daily.   GABAPENTIN (NEURONTIN) 300 MG CAPSULE    Take 300 mg by mouth 3 (three) times daily.   LISINOPRIL (PRINIVIL,ZESTRIL) 20 MG TABLET    TAKE 1 TABLET (20 MG TOTAL) BY MOUTH DAILY.   MECLIZINE (ANTIVERT) 25 MG TABLET    Take 25 mg by mouth 3 (three) times daily as needed for dizziness.   METOPROLOL SUCCINATE (TOPROL-XL) 25 MG 24 HR TABLET    Take 1 tablet (25 mg total) by mouth daily.   MULTIVITAMIN (THERAGRAN) PER TABLET    Take 1 tablet by mouth daily.   NEOMYCIN-POLYMYXIN-HYDROCORTISONE (CORTISPORIN) 1 % SOLN OTIC SOLUTION    Place 2 drops into both ears daily as needed.   NITROGLYCERIN (NITROSTAT) 0.4 MG SL TABLET    Place 0.4 mg under the tongue every 5 (five) minutes as needed.   PRAVASTATIN (PRAVACHOL) 80 MG TABLET    Take 1 tablet (80 mg total) by mouth daily.   PSYLLIUM (METAMUCIL) 58.6 % POWDER    Take 1 packet by mouth daily.    VITAMIN C (ASCORBIC ACID) 500 MG TABLET    Take 500 mg by mouth daily.   WARFARIN (COUMADIN) 5 MG TABLET    TAKE AS DIRECTED BY COUMADIN CLINIC    Patient Care Team: Margarita Rana, MD as PCP - General (Family Medicine)     Objective:   Vitals: BP 106/66 mmHg  Pulse 74  Temp(Src) 98.1 F (36.7 C) (Oral)  Resp 16  Ht 5\' 8"  (1.727 m)  Wt 183 lb (83.008 kg)  BMI 27.83 kg/m2  Physical Exam  Constitutional: He appears well-developed and well-nourished.  HENT:  Head: Normocephalic and atraumatic.  Right Ear: External ear normal.  Left Ear: External ear normal.  Nose: Nose normal.  Mouth/Throat: Oropharynx is clear and moist.  Eyes: Conjunctivae and EOM are normal. Pupils are equal, round, and reactive to light.  Neck: Normal range of motion. Neck supple.  Cardiovascular: Intact distal pulses.  An irregularly irregular rhythm present.    Pulmonary/Chest: Effort normal and breath sounds normal.  Abdominal: Soft. Bowel sounds are normal.  Musculoskeletal: He exhibits edema (left loweg leg +2).  Neurological: He is alert. He has normal reflexes.  Skin: Skin is warm and dry.  Psychiatric: He has a normal mood and affect. His behavior is normal. Judgment and thought content normal.    Activities of Daily Living In your present state of health, do you have any difficulty performing the following activities: 03/28/2015  Hearing? N  Vision? N  Difficulty concentrating or making decisions? N  Walking or climbing stairs? Y  Dressing or bathing? N  Doing errands, shopping? N  Fall Risk Assessment Fall Risk  03/28/2015  Falls in the past year? No     Depression Screen PHQ 2/9 Scores 03/28/2015  PHQ - 2 Score 0    Cognitive Testing - 6-CIT  Correct? Score   What year is it? yes 0 0 or 4  What month is it? yes 0 0 or 3  Memorize:    Pia Mau,  41,  [address]  [place],      What time is it? (within 1 hour) yes 0 0 or 3  Count backwards from 20 yes 0 0, 2, or 4  Name the months of the year yes 0 0, 2, or 4  Repeat name & address above yes 1 0, 2, 4, 6, 8, or 10       TOTAL SCORE  1/28   Interpretation:  Normal  Normal (0-7) Abnormal (8-28)       Assessment & Plan:     Annual Wellness Visit  Reviewed patient's Family Medical History Reviewed and updated list of patient's medical providers Assessment of cognitive impairment was done Assessed patient's functional ability Established a written schedule for health screening [place] Completed and Reviewed  Exercise Activities and Dietary recommendations Goals    . Exercise 150 minutes per week (moderate activity)       Immunization History  Administered Date(s) Administered  . Pneumococcal Conjugate-13 03/28/2015  . Tdap 05/17/2012  . Zoster 11/04/2005    Health Maintenance  Topic Date Due  . COLONOSCOPY  09/23/1989  .  INFLUENZA VACCINE  04/09/2015  . PNA vac Low Risk Adult (2 of 2 - PPSV23) 03/27/2016  . TETANUS/TDAP  05/17/2022  . ZOSTAVAX  Completed       1. Medicare annual wellness visit, subsequent Stable. Patient advised to continue eating healthy and exercise daily.  2. AF (paroxysmal atrial fibrillation) - TSH  3. Essential hypertension - Comprehensive metabolic panel  4. Hyperglycemia - Hemoglobin A1c  5. Need for pneumococcal vaccination      Patient seen and examined by Dr. Jerrell Belfast, and note scribed by Philbert Riser. Dimas, CMA. I have reviewed the document for accuracy and completeness and I agree with above. Jerrell Belfast, MD   Margarita Rana, MD          ------------------------------------------------------------------------------------------------------------

## 2015-03-29 LAB — COMPREHENSIVE METABOLIC PANEL
ALT: 31 IU/L (ref 0–44)
AST: 31 IU/L (ref 0–40)
Albumin/Globulin Ratio: 2.6 — ABNORMAL HIGH (ref 1.1–2.5)
Albumin: 4.6 g/dL (ref 3.5–4.8)
Alkaline Phosphatase: 51 IU/L (ref 39–117)
BILIRUBIN TOTAL: 0.5 mg/dL (ref 0.0–1.2)
BUN/Creatinine Ratio: 16 (ref 10–22)
BUN: 14 mg/dL (ref 8–27)
CALCIUM: 10.1 mg/dL (ref 8.6–10.2)
CHLORIDE: 103 mmol/L (ref 97–108)
CO2: 28 mmol/L (ref 18–29)
CREATININE: 0.89 mg/dL (ref 0.76–1.27)
GFR calc Af Amer: 97 mL/min/{1.73_m2} (ref >59)
GFR calc non Af Amer: 84 mL/min/{1.73_m2} (ref >59)
Globulin, Total: 1.8 g/dL (ref 1.5–4.5)
Glucose: 93 mg/dL (ref 65–99)
POTASSIUM: 5.3 mmol/L — AB (ref 3.5–5.2)
SODIUM: 145 mmol/L — AB (ref 134–144)
Total Protein: 6.4 g/dL (ref 6.0–8.5)

## 2015-03-29 LAB — HEMOGLOBIN A1C
Est. average glucose Bld gHb Est-mCnc: 126 mg/dL
Hgb A1c MFr Bld: 6 % — ABNORMAL HIGH (ref 4.8–5.6)

## 2015-03-29 LAB — TSH: TSH: 0.421 u[IU]/mL — ABNORMAL LOW (ref 0.450–4.500)

## 2015-04-18 ENCOUNTER — Ambulatory Visit (INDEPENDENT_AMBULATORY_CARE_PROVIDER_SITE_OTHER): Payer: Medicare Other | Admitting: *Deleted

## 2015-04-18 DIAGNOSIS — I4891 Unspecified atrial fibrillation: Secondary | ICD-10-CM

## 2015-04-18 DIAGNOSIS — Z5181 Encounter for therapeutic drug level monitoring: Secondary | ICD-10-CM | POA: Diagnosis not present

## 2015-04-18 LAB — POCT INR: INR: 2.2

## 2015-05-09 ENCOUNTER — Ambulatory Visit (INDEPENDENT_AMBULATORY_CARE_PROVIDER_SITE_OTHER): Payer: Medicare Other | Admitting: Family Medicine

## 2015-05-09 ENCOUNTER — Encounter: Payer: Self-pay | Admitting: Family Medicine

## 2015-05-09 VITALS — BP 104/62 | HR 60 | Temp 97.7°F | Resp 16 | Wt 180.0 lb

## 2015-05-09 DIAGNOSIS — I2510 Atherosclerotic heart disease of native coronary artery without angina pectoris: Secondary | ICD-10-CM

## 2015-05-09 DIAGNOSIS — M1990 Unspecified osteoarthritis, unspecified site: Secondary | ICD-10-CM | POA: Diagnosis not present

## 2015-05-09 DIAGNOSIS — E875 Hyperkalemia: Secondary | ICD-10-CM

## 2015-05-09 DIAGNOSIS — R7989 Other specified abnormal findings of blood chemistry: Secondary | ICD-10-CM | POA: Diagnosis not present

## 2015-05-09 DIAGNOSIS — M4800 Spinal stenosis, site unspecified: Secondary | ICD-10-CM

## 2015-05-09 DIAGNOSIS — E870 Hyperosmolality and hypernatremia: Secondary | ICD-10-CM

## 2015-05-09 NOTE — Progress Notes (Signed)
Subjective:    Patient ID: Larry Bautista, male    DOB: Jun 24, 1941, 74 y.o.   MRN: [id number]  Hyperglycemia Episode onset: Last A1C on 03/28/2015 was elevated at 6.0%. Pt was told to D/E and recheck this in 6 weeks. Associated symptoms include arthralgias, a change in bowel habit (constipation), coughing, myalgias and neck pain. Pertinent negatives include no abdominal pain, anorexia, chest pain, chills, congestion, diaphoresis, fatigue, fever, headaches, joint swelling, nausea, numbness, rash, sore throat, swollen glands, urinary symptoms, vertigo, visual change, vomiting or weakness. Treatments tried: Diet and exercise- pt reports good compliance with this treatment plan; reports he walks daily.  Thyroid Problem Visit type: Last TSH was 0.421 on 03/28/2015. PCP wanted to recheck TSH with T4 in 6 weeks. Symptoms include constipation, palpitations (A-Fib) and weight loss (pt working on this). Patient reports no anxiety, cold intolerance, depressed mood, diaphoresis, diarrhea, dry skin, fatigue, hair loss, heat intolerance, hoarse voice, leg swelling, nail problem, tremors, visual change or weight gain.   Abnormal Labs Pt's Sodium and Potassium were elevated on 03/28/2015. Provider want to recheck these levels in 6 weeks.   Review of Systems  Constitutional: Positive for weight loss (pt working on this). Negative for fever, chills, weight gain, diaphoresis and fatigue.  HENT: Negative for congestion, hoarse voice and sore throat.   Respiratory: Positive for cough.   Cardiovascular: Positive for palpitations (A-Fib). Negative for chest pain.  Gastrointestinal: Positive for constipation and change in bowel habit (constipation). Negative for nausea, vomiting, abdominal pain, diarrhea and anorexia.  Endocrine: Negative for cold intolerance and heat intolerance.  Musculoskeletal: Positive for myalgias, arthralgias and neck pain. Negative for joint swelling.  Skin: Negative for rash.   Neurological: Negative for vertigo, tremors, weakness, numbness and headaches.  Psychiatric/Behavioral: The patient is not nervous/anxious.    BP 104/62 mmHg  Pulse 60  Temp(Src) 97.7 F (36.5 C) (Oral)  Resp 16  Wt 180 lb (81.647 kg)   Patient Active Problem List   Diagnosis Date Noted  . Hyperglycemia 03/28/2015  . Allergic rhinitis 01/24/2015  . Arthritis 01/24/2015  . Basal cell carcinoma 01/24/2015  . Benign fibroma of prostate 01/24/2015  . Body mass index 27.0-27.9, adult 01/24/2015  . Arteriosclerosis of coronary artery 01/24/2015  . Narrowing of intervertebral disc space 01/24/2015  . H/O renal calculi 01/24/2015  . BP (high blood pressure) 01/24/2015  . Headache, migraine 01/24/2015  . AF (paroxysmal atrial fibrillation) 01/24/2015  . Spinal stenosis 01/24/2015  . SPL (spondylolisthesis) 01/24/2015  . Benign paroxysmal positional nystagmus 01/24/2015  . Encounter for therapeutic drug monitoring 11/02/2013  . ATRIAL FIBRILLATION 05/04/2009  . Hyperlipidemia 05/03/2009  . MIGRAINE HEADACHE 05/03/2009  . Essential hypertension 05/03/2009  . CAD in native artery 05/03/2009  . KNEE PAIN 05/03/2009  . BACK PAIN 05/03/2009  . CHEST PAIN, EXERTIONAL 05/03/2009  . RENAL CALCULUS, HX OF 05/03/2009  . TOBACCO ABUSE, HX OF 05/03/2009   Past Medical History  Diagnosis Date  . Persistent atrial fibrillation     longstanding persistent (since 05/2008)  . Coronary artery disease     with LAD DE stent 2004  . Hypertension   . Hyperlipidemia   . Migraine   . Kidney stones   . Chronic knee pain   . Lower back pain     status post surgery for spondylolisthesis  . Allergy   . CHF (congestive heart failure)    Current Outpatient Prescriptions on File Prior to Visit  Medication Sig  . aspirin 81 MG  tablet Take 81 mg by mouth daily.  . diphenhydramine-acetaminophen (TYLENOL PM) 25-500 MG TABS Take 1 tablet by mouth at bedtime as needed.  [name] eplerenone (INSPRA) 25 MG  tablet TAKE 1 TABLET (25 MG TOTAL) BY MOUTH DAILY.  . fish oil-omega-3 fatty acids 1000 MG capsule Take 2 capsules by mouth daily.   . fluticasone (FLONASE) 50 MCG/ACT nasal spray Place 2 sprays into both nostrils daily.  [name] gabapentin (NEURONTIN) 300 MG capsule Take 300 mg by mouth 3 (three) times daily.  [name] lisinopril (PRINIVIL,ZESTRIL) 20 MG tablet TAKE 1 TABLET (20 MG TOTAL) BY MOUTH DAILY.  . meclizine (ANTIVERT) 25 MG tablet Take 25 mg by mouth 3 (three) times daily as needed for dizziness.  . metoprolol succinate (TOPROL-XL) 25 MG 24 hr tablet Take 1 tablet (25 mg total) by mouth daily.  . multivitamin (THERAGRAN) per tablet Take 1 tablet by mouth daily.  . NEOMYCIN-POLYMYXIN-HYDROCORTISONE (CORTISPORIN) 1 % SOLN otic solution Place 2 drops into both ears daily as needed.  . nitroGLYCERIN (NITROSTAT) 0.4 MG SL tablet Place 0.4 mg under the tongue every 5 (five) minutes as needed.  . pravastatin (PRAVACHOL) 80 MG tablet Take 1 tablet (80 mg total) by mouth daily.  . psyllium (METAMUCIL) 58.6 % powder Take 1 packet by mouth daily.   . vitamin C (ASCORBIC ACID) 500 MG tablet Take 500 mg by mouth daily.  [name] warfarin (COUMADIN) 5 MG tablet TAKE AS DIRECTED BY COUMADIN CLINIC   No current facility-administered medications on file prior to visit.   Allergies  Allergen Reactions  . Simvastatin Other (See Comments)    Leg pain  . Celecoxib Rash   Past Surgical History  Procedure Laterality Date  . Back surgery      spondylolisthesis  . Coronary angioplasty with stent placement  04/2003    CYPHER stent implantation in the LAD  . Cardiac catheterization  03/06/2003  . Skin cancer excision  1980's    Face  . Vascular surgery      Vascular Stent  . Hand surgery Right 2014    carpal tunnel   Social History   Social History  . Marital Status: Married    Spouse Name: Larry Bautista  . Number of Children: 1  . Years of Education: N/A   Occupational History  .      works part-time  Optician, dispensing   Social History Main Topics  . Smoking status: Former Smoker    Types: Cigarettes    Quit date: 11/06/1996  . Smokeless tobacco: Never Used  . Alcohol Use: No  . Drug Use: No  . Sexual Activity: Not on file   Other Topics Concern  . Not on file   Social History Narrative   Family History  Problem Relation Age of Onset  . Heart attack Father   . Colon cancer Father   . Congestive Heart Failure Father   . Atrial fibrillation Brother   . Stroke Brother   . Lung cancer Mother   . Atrial fibrillation Brother   . COPD Brother   . Congestive Heart Failure Brother   . Lung cancer Brother        Objective:   Physical Exam  Constitutional: He is oriented to person, place, and time. He appears well-developed and well-nourished.  Neurological: He is alert and oriented to person, place, and time.   BP 104/62 mmHg  Pulse 60  Temp(Src) 97.7 F (36.5 C) (Oral)  Resp 16  Wt 180 lb (81.647  kg)       Assessment & Plan:  1. Abnormal thyroid stimulating hormone (TSH) level Recheck today. No real symptoms.  - T4 AND TSH  2. Hyperkalemia Recheck labs today.  - Comprehensive metabolic panel  3. Hypernatremia Recheck labs today.   4. Spinal stenosis, unspecified spinal region Discussed possible Ultram or narcotic. Did not want any pain medication today.    Margarita Rana, MD

## 2015-05-15 DIAGNOSIS — E875 Hyperkalemia: Secondary | ICD-10-CM | POA: Diagnosis not present

## 2015-05-15 DIAGNOSIS — R7989 Other specified abnormal findings of blood chemistry: Secondary | ICD-10-CM | POA: Diagnosis not present

## 2015-05-16 ENCOUNTER — Telehealth: Payer: Self-pay

## 2015-05-16 LAB — COMPREHENSIVE METABOLIC PANEL
ALBUMIN: 4.2 g/dL (ref 3.5–4.8)
ALT: 26 IU/L (ref 0–44)
AST: 30 IU/L (ref 0–40)
Albumin/Globulin Ratio: 2.1 (ref 1.1–2.5)
Alkaline Phosphatase: 51 IU/L (ref 39–117)
BUN / CREAT RATIO: 22 (ref 10–22)
BUN: 14 mg/dL (ref 8–27)
Bilirubin Total: 0.6 mg/dL (ref 0.0–1.2)
CALCIUM: 10 mg/dL (ref 8.6–10.2)
CO2: 25 mmol/L (ref 18–29)
CREATININE: 0.65 mg/dL — AB (ref 0.76–1.27)
Chloride: 102 mmol/L (ref 97–108)
GFR calc Af Amer: 110 mL/min/{1.73_m2} (ref >59)
GFR calc non Af Amer: 95 mL/min/{1.73_m2} (ref >59)
Globulin, Total: 2 g/dL (ref 1.5–4.5)
Glucose: 98 mg/dL (ref 65–99)
Potassium: 4.8 mmol/L (ref 3.5–5.2)
Sodium: 145 mmol/L — ABNORMAL HIGH (ref 134–144)
Total Protein: 6.2 g/dL (ref 6.0–8.5)

## 2015-05-16 LAB — T4 AND TSH
T4, Total: 8.5 ug/dL (ref 4.5–12.0)
TSH: 0.868 u[IU]/mL (ref 0.450–4.500)

## 2015-05-16 NOTE — Telephone Encounter (Signed)
-----   Message from Margarita Rana, MD sent at 05/16/2015  7:03 AM EDT ----- Labs stable. Please notify patient. Thanks.

## 2015-05-16 NOTE — Telephone Encounter (Signed)
Patient advised as directed below.  Thanks,  -Lila Lufkin 

## 2015-05-30 ENCOUNTER — Ambulatory Visit (INDEPENDENT_AMBULATORY_CARE_PROVIDER_SITE_OTHER): Payer: Medicare Other | Admitting: *Deleted

## 2015-05-30 DIAGNOSIS — I4891 Unspecified atrial fibrillation: Secondary | ICD-10-CM

## 2015-05-30 DIAGNOSIS — Z5181 Encounter for therapeutic drug level monitoring: Secondary | ICD-10-CM | POA: Diagnosis not present

## 2015-05-30 LAB — POCT INR: INR: 2.5

## 2015-06-04 DIAGNOSIS — Z23 Encounter for immunization: Secondary | ICD-10-CM | POA: Diagnosis not present

## 2015-07-11 ENCOUNTER — Ambulatory Visit (INDEPENDENT_AMBULATORY_CARE_PROVIDER_SITE_OTHER): Payer: Medicare Other | Admitting: *Deleted

## 2015-07-11 DIAGNOSIS — I4891 Unspecified atrial fibrillation: Secondary | ICD-10-CM

## 2015-07-11 DIAGNOSIS — N138 Other obstructive and reflux uropathy: Secondary | ICD-10-CM | POA: Diagnosis not present

## 2015-07-11 DIAGNOSIS — N200 Calculus of kidney: Secondary | ICD-10-CM | POA: Diagnosis not present

## 2015-07-11 DIAGNOSIS — N401 Enlarged prostate with lower urinary tract symptoms: Secondary | ICD-10-CM | POA: Diagnosis not present

## 2015-07-11 DIAGNOSIS — R3913 Splitting of urinary stream: Secondary | ICD-10-CM | POA: Diagnosis not present

## 2015-07-11 DIAGNOSIS — R3121 Asymptomatic microscopic hematuria: Secondary | ICD-10-CM | POA: Diagnosis not present

## 2015-07-11 DIAGNOSIS — Z5181 Encounter for therapeutic drug level monitoring: Secondary | ICD-10-CM

## 2015-07-11 LAB — POCT INR: INR: 2

## 2015-07-16 ENCOUNTER — Other Ambulatory Visit: Payer: Self-pay | Admitting: Interventional Cardiology

## 2015-08-22 ENCOUNTER — Ambulatory Visit (INDEPENDENT_AMBULATORY_CARE_PROVIDER_SITE_OTHER): Payer: Medicare Other | Admitting: *Deleted

## 2015-08-22 DIAGNOSIS — Z5181 Encounter for therapeutic drug level monitoring: Secondary | ICD-10-CM

## 2015-08-22 DIAGNOSIS — I4891 Unspecified atrial fibrillation: Secondary | ICD-10-CM | POA: Diagnosis not present

## 2015-08-22 LAB — POCT INR: INR: 2.1

## 2015-08-27 ENCOUNTER — Other Ambulatory Visit: Payer: Self-pay | Admitting: Interventional Cardiology

## 2015-08-27 ENCOUNTER — Other Ambulatory Visit: Payer: Self-pay | Admitting: Family Medicine

## 2015-08-27 DIAGNOSIS — J309 Allergic rhinitis, unspecified: Secondary | ICD-10-CM

## 2015-08-28 ENCOUNTER — Ambulatory Visit (INDEPENDENT_AMBULATORY_CARE_PROVIDER_SITE_OTHER): Payer: Medicare Other | Admitting: Family Medicine

## 2015-08-28 ENCOUNTER — Encounter: Payer: Self-pay | Admitting: Family Medicine

## 2015-08-28 VITALS — BP 104/78 | HR 68 | Temp 97.6°F | Resp 16 | Wt 183.6 lb

## 2015-08-28 DIAGNOSIS — I2510 Atherosclerotic heart disease of native coronary artery without angina pectoris: Secondary | ICD-10-CM

## 2015-08-28 DIAGNOSIS — J45909 Unspecified asthma, uncomplicated: Secondary | ICD-10-CM

## 2015-08-28 MED ORDER — AMOXICILLIN 875 MG PO TABS: 875.0000 mg | ORAL_TABLET | Freq: Two times a day (BID) | ORAL | Status: DC

## 2015-08-28 MED ORDER — FLUTICASONE-SALMETEROL 250-50 MCG/DOSE IN AEPB: 1.0000 | INHALATION_SPRAY | Freq: Two times a day (BID) | RESPIRATORY_TRACT | Status: DC

## 2015-08-28 MED ORDER — ALBUTEROL SULFATE HFA 108 (90 BASE) MCG/ACT IN AERS
2.0000 | INHALATION_SPRAY | Freq: Four times a day (QID) | RESPIRATORY_TRACT | Status: DC | PRN
Start: 2015-08-28 — End: 2015-10-24

## 2015-08-28 NOTE — Patient Instructions (Signed)

## 2015-08-28 NOTE — Progress Notes (Signed)
Patient ID: Larry Bautista, male   DOB: [birth year], 74 y.o.   MRN: [id number]   Patient: Larry Bautista Male    DOB: Dec 23, 1940   74 y.o.   MRN: [id number] Visit Date: 08/28/2015  Today's Provider: Vernie Murders, PA   Chief Complaint  Patient presents with  . URI   Subjective:    URI  This is a new problem. The current episode started in the past 7 days. The problem has been gradually worsening. The maximum temperature recorded prior to his arrival was 100.4 - 100.9 F. Associated symptoms include congestion, coughing and wheezing. He has tried antihistamine for the symptoms. The treatment provided mild relief.   Patient Active Problem List   Diagnosis Date Noted  . Hyperglycemia 03/28/2015  . Allergic rhinitis 01/24/2015  . Arthritis 01/24/2015  . Basal cell carcinoma 01/24/2015  . Benign fibroma of prostate 01/24/2015  . Body mass index 27.0-27.9, adult 01/24/2015  . Arteriosclerosis of coronary artery 01/24/2015  . Narrowing of intervertebral disc space 01/24/2015  . H/O renal calculi 01/24/2015  . BP (high blood pressure) 01/24/2015  . Headache, migraine 01/24/2015  . AF (paroxysmal atrial fibrillation) ([place]) 01/24/2015  . Spinal stenosis 01/24/2015  . SPL (spondylolisthesis) 01/24/2015  . Benign paroxysmal positional nystagmus 01/24/2015  . Encounter for therapeutic drug monitoring 11/02/2013  . ATRIAL FIBRILLATION 05/04/2009  . Hyperlipidemia 05/03/2009  . MIGRAINE HEADACHE 05/03/2009  . Essential hypertension 05/03/2009  . CAD in native artery 05/03/2009  . KNEE PAIN 05/03/2009  . BACK PAIN 05/03/2009  . CHEST PAIN, EXERTIONAL 05/03/2009  . RENAL CALCULUS, HX OF 05/03/2009  . TOBACCO ABUSE, HX OF 05/03/2009   Past Surgical History  Procedure Laterality Date  . Back surgery      spondylolisthesis  . Coronary angioplasty with stent placement  04/2003    CYPHER stent implantation in the LAD  . Cardiac catheterization  03/06/2003  . Skin cancer excision   1980's    Face  . Vascular surgery      Vascular Stent  . Hand surgery Right 2014    carpal tunnel   Family History  Problem Relation Age of Onset  . Heart attack Father   . Colon cancer Father   . Congestive Heart Failure Father   . Atrial fibrillation Brother   . Stroke Brother   . Lung cancer Mother   . Atrial fibrillation Brother   . COPD Brother   . Congestive Heart Failure Brother   . Lung cancer Brother    Allergies  Allergen Reactions  . Simvastatin Other (See Comments)    Leg pain  . Celecoxib Rash     Previous Medications   ASPIRIN 81 MG TABLET    Take 81 mg by mouth daily.   DIPHENHYDRAMINE-ACETAMINOPHEN (TYLENOL PM) 25-500 MG TABS    Take 1 tablet by mouth at bedtime as needed.   EPLERENONE (INSPRA) 25 MG TABLET    TAKE 1 TABLET (25 MG TOTAL) BY MOUTH DAILY.   FISH OIL-OMEGA-3 FATTY ACIDS 1000 MG CAPSULE    Take 2 capsules by mouth daily.    FLUTICASONE (FLONASE) 50 MCG/ACT NASAL SPRAY    USE 2 SPRAYS IN EACH NOSTRIL EVERY DAY   GABAPENTIN (NEURONTIN) 300 MG CAPSULE    Take 300 mg by mouth 3 (three) times daily.   LISINOPRIL (PRINIVIL,ZESTRIL) 20 MG TABLET    TAKE 1 TABLET (20 MG TOTAL) BY MOUTH DAILY.   MECLIZINE (ANTIVERT) 25 MG TABLET    Take 25 mg  by mouth 3 (three) times daily as needed for dizziness.   METOPROLOL SUCCINATE (TOPROL-XL) 25 MG 24 HR TABLET    Take 1 tablet (25 mg total) by mouth daily.   MULTIVITAMIN (THERAGRAN) PER TABLET    Take 1 tablet by mouth daily.   NEOMYCIN-POLYMYXIN-HYDROCORTISONE (CORTISPORIN) 1 % SOLN OTIC SOLUTION    Place 2 drops into both ears daily as needed.   NITROGLYCERIN (NITROSTAT) 0.4 MG SL TABLET    Place 0.4 mg under the tongue every 5 (five) minutes as needed.   PRAVASTATIN (PRAVACHOL) 80 MG TABLET    Take 1 tablet (80 mg total) by mouth daily.   PSYLLIUM (METAMUCIL) 58.6 % POWDER    Take 1 packet by mouth daily.    TAMSULOSIN (FLOMAX) 0.4 MG CAPS CAPSULE    Take 0.4 mg by mouth daily after supper.   VITAMIN C  (ASCORBIC ACID) 500 MG TABLET    Take 500 mg by mouth daily.   WARFARIN (COUMADIN) 5 MG TABLET    TAKE AS DIRECTED BY COUMADIN CLINIC    Review of Systems  Constitutional: Positive for fever.  HENT: Positive for congestion.   Eyes: Negative.   Respiratory: Positive for cough and wheezing.   Cardiovascular: Negative.   Gastrointestinal: Negative.   Endocrine: Negative.   Genitourinary: Negative.   Musculoskeletal: Negative.   Skin: Negative.   Allergic/Immunologic: Negative.   Neurological: Negative.   Hematological: Negative.   Psychiatric/Behavioral: Negative.     Social History  Substance Use Topics  . Smoking status: Former Smoker    Types: Cigarettes    Quit date: 11/06/1996  . Smokeless tobacco: Never Used  . Alcohol Use: No   Objective:   BP 104/78 mmHg  Pulse 68  Temp(Src) 97.6 F (36.4 C) (Oral)  Resp 16  Wt 183 lb 9.6 oz (83.28 kg)  SpO2 95%  Physical Exam  Constitutional: He is oriented to person, place, and time. He appears well-developed and well-nourished.  HENT:  Head: Normocephalic.  Right Ear: External ear normal.  Left Ear: External ear normal.  Nose: Nose normal.  Mouth/Throat: Oropharynx is clear and moist.  Eyes: Conjunctivae and EOM are normal.  Neck: Normal range of motion. Neck supple.  Cardiovascular: Normal rate, regular rhythm and normal heart sounds.   Pulmonary/Chest: He has wheezes.  Rhonchi  Abdominal: Soft. Bowel sounds are normal.  Neurological: He is alert and oriented to person, place, and time.  Psychiatric: He has a normal mood and affect. His behavior is normal.      Assessment & Plan:     1. Asthmatic bronchitis, unspecified asthma severity, uncomplicated Recent onset with some fever last night. Will treat with Advair BID, antibiotic, Tussin-DM prn cough, continue Claritin and Flonase. Increase fluid intake and recheck if no better in 5 days. - Fluticasone-Salmeterol (ADVAIR DISKUS) 250-50 MCG/DOSE AEPB; Inhale 1 puff  into the lungs 2 (two) times daily.  Dispense: 1 each; Refill: 0 - albuterol (PROVENTIL HFA;VENTOLIN HFA) 108 (90 BASE) MCG/ACT inhaler; Inhale 2 puffs into the lungs every 6 (six) hours as needed for wheezing or shortness of breath.  Dispense: 1 Inhaler; Refill: 0 - amoxicillin (AMOXIL) 875 MG tablet; Take 1 tablet (875 mg total) by mouth 2 (two) times daily.  Dispense: 20 tablet; Refill: 0

## 2015-10-03 ENCOUNTER — Ambulatory Visit (INDEPENDENT_AMBULATORY_CARE_PROVIDER_SITE_OTHER): Payer: Medicare Other | Admitting: *Deleted

## 2015-10-03 DIAGNOSIS — Z5181 Encounter for therapeutic drug level monitoring: Secondary | ICD-10-CM | POA: Diagnosis not present

## 2015-10-03 DIAGNOSIS — I4891 Unspecified atrial fibrillation: Secondary | ICD-10-CM | POA: Diagnosis not present

## 2015-10-03 LAB — POCT INR: INR: 2.3

## 2015-10-19 ENCOUNTER — Ambulatory Visit: Payer: Medicare Other | Admitting: Interventional Cardiology

## 2015-10-24 ENCOUNTER — Encounter: Payer: Self-pay | Admitting: Interventional Cardiology

## 2015-10-24 ENCOUNTER — Ambulatory Visit (INDEPENDENT_AMBULATORY_CARE_PROVIDER_SITE_OTHER): Payer: Medicare Other | Admitting: Interventional Cardiology

## 2015-10-24 VITALS — BP 126/82 | HR 60 | Ht 68.0 in | Wt 185.1 lb

## 2015-10-24 DIAGNOSIS — I482 Chronic atrial fibrillation, unspecified: Secondary | ICD-10-CM

## 2015-10-24 DIAGNOSIS — I2510 Atherosclerotic heart disease of native coronary artery without angina pectoris: Secondary | ICD-10-CM | POA: Diagnosis not present

## 2015-10-24 DIAGNOSIS — E785 Hyperlipidemia, unspecified: Secondary | ICD-10-CM | POA: Diagnosis not present

## 2015-10-24 DIAGNOSIS — I10 Essential (primary) hypertension: Secondary | ICD-10-CM

## 2015-10-24 NOTE — Progress Notes (Signed)
Cardiology Office Note   Date:  10/24/2015   ID:  Larry Bautista, DOB 1940-12-22, MRN [id number]  PCP:  Margarita Rana, MD  Cardiologist:  Sinclair Grooms, MD   Chief Complaint  Patient presents with  . Coronary Artery Disease      History of Present Illness: Larry Bautista is a 75 y.o. male who presents for  CAD, chronic atrial fibrillation, anticoagulation therapy, hypertension, and chronic diastolic heart failure.   Overall doing well. He has not had syncope. He denies orthopnea and PND. Has had occasional wheezing recently. He has had a cough associated with it as well. He has not had angina or other cardiac complaints. No peripheral edema.    Past Medical History  Diagnosis Date  . Persistent atrial fibrillation ([place])     longstanding persistent (since 05/2008)  . Coronary artery disease     with LAD DE stent 2004  . Hypertension   . Hyperlipidemia   . Migraine   . Kidney stones   . Chronic knee pain   . Lower back pain     status post surgery for spondylolisthesis  . Allergy   . CHF (congestive heart failure) [facility])     Past Surgical History  Procedure Laterality Date  . Back surgery      spondylolisthesis  . Coronary angioplasty with stent placement  04/2003    CYPHER stent implantation in the LAD  . Cardiac catheterization  03/06/2003  . Skin cancer excision  1980's    Face  . Vascular surgery      Vascular Stent  . Hand surgery Right 2014    carpal tunnel     Current Outpatient Prescriptions  Medication Sig Dispense Refill  . aspirin 81 MG tablet Take 81 mg by mouth daily.    . diphenhydramine-acetaminophen (TYLENOL PM) 25-500 MG TABS Take 1 tablet by mouth at bedtime as needed.    [name] eplerenone (INSPRA) 25 MG tablet TAKE 1 TABLET (25 MG TOTAL) BY MOUTH DAILY. 30 tablet 2  . fish oil-omega-3 fatty acids 1000 MG capsule Take 2 capsules by mouth daily.     . fluticasone (FLONASE) 50 MCG/ACT nasal spray USE 2 SPRAYS IN EACH NOSTRIL EVERY DAY 48  g 3  . gabapentin (NEURONTIN) 300 MG capsule Take 300 mg by mouth 3 (three) times daily.    [name] lisinopril (PRINIVIL,ZESTRIL) 20 MG tablet TAKE 1 TABLET (20 MG TOTAL) BY MOUTH DAILY. 30 tablet 10  . meclizine (ANTIVERT) 25 MG tablet Take 25 mg by mouth 3 (three) times daily as needed for dizziness.    . metoprolol succinate (TOPROL-XL) 25 MG 24 hr tablet Take 1 tablet (25 mg total) by mouth daily. 90 tablet 4  . multivitamin (THERAGRAN) per tablet Take 1 tablet by mouth daily.    . NEOMYCIN-POLYMYXIN-HYDROCORTISONE (CORTISPORIN) 1 % SOLN otic solution Place 2 drops into both ears daily as needed.  0  . nitroGLYCERIN (NITROSTAT) 0.4 MG SL tablet Place 0.4 mg under the tongue every 5 (five) minutes as needed.    . pravastatin (PRAVACHOL) 80 MG tablet Take 1 tablet (80 mg total) by mouth daily. 90 tablet 4  . psyllium (METAMUCIL) 58.6 % powder Take 1 packet by mouth daily.     . tamsulosin (FLOMAX) 0.4 MG CAPS capsule Take 0.4 mg by mouth daily after supper.    . vitamin C (ASCORBIC ACID) 500 MG tablet Take 500 mg by mouth daily.    [name] warfarin (COUMADIN) 5 MG  tablet TAKE AS DIRECTED BY COUMADIN CLINIC 60 tablet 1   No current facility-administered medications for this visit.    Allergies:   Simvastatin and Celecoxib    Social History:  The patient  reports that he quit smoking about 18 years ago. His smoking use included Cigarettes. He has never used smokeless tobacco. He reports that he does not drink alcohol or use illicit drugs.   Family History:  The patient's family history includes Atrial fibrillation in his brother and brother; COPD in his brother; Colon cancer in his father; Congestive Heart Failure in his brother and father; Heart attack in his father; Lung cancer in his brother and mother; Stroke in his brother.    ROS:  Please see the history of present illness.   Otherwise, review of systems are positive for  Prior smoker, wheezing, cough, occasional orthostatic dizziness. Also pain  in the back and left lower extremity pain..   All other systems are reviewed and negative.    PHYSICAL EXAM: VS:  BP 126/82 mmHg  Pulse 60  Ht 5\' 8"  (1.727 m)  Wt 185 lb 1.9 oz (83.97 kg)  BMI 28.15 kg/m2 , BMI Body mass index is 28.15 kg/(m^2). GEN: Well nourished, well developed, in no acute distress HEENT: normal Neck: no JVD, carotid bruits, or masses Cardiac: RRR.  There is no murmur, rub, or gallop. There is no edema. Respiratory:  clear to auscultation bilaterally, normal work of breathing. GI: soft, nontender, nondistended, + BS MS: no deformity or atrophy Skin: warm and dry, no rash Neuro:  Strength and sensation are intact Psych: euthymic mood, full affect   EKG:  EKG is ordered today. The ekg reveals  Atrial fibrillation with controlled ventricular response otherwise unremarkable   Recent Labs: 05/15/2015: ALT 26; BUN 14; Creatinine, Ser 0.65*; Potassium 4.8; Sodium 145*; TSH 0.868    Lipid Panel    Component Value Date/Time   CHOL 170 01/24/2015 0926   TRIG 134.0 01/24/2015 0926   HDL 59.90 01/24/2015 0926   CHOLHDL 3 01/24/2015 0926   VLDL 26.8 01/24/2015 0926   LDLCALC 83 01/24/2015 0926   LDLDIRECT 136.6 08/24/2013 0938      Wt Readings from Last 3 Encounters:  10/24/15 185 lb 1.9 oz (83.97 kg)  08/28/15 183 lb 9.6 oz (83.28 kg)  05/09/15 180 lb (81.647 kg)      Other studies Reviewed: Additional studies/ records that were reviewed today include: none. The findings include none.    ASSESSMENT AND PLAN:  1. Chronic atrial fibrillation (HCC)  There is controlled rate - EKG 12-Lead  2. CAD in native artery  asymptomatic - EKG 12-Lead  3. Essential hypertension  controlled but with orthostatic decrease over proximally 15 mmHg - EKG 12-Lead  4. Hyperlipidemia  followed by primary care    Current medicines are reviewed at length with the patient today.  The patient has the following concerns regarding medicines: none.  The following  changes/actions have been instituted:     notify of chest discomfort, orthopnea, or PND.   Continue aerobic activity   notify if orthostatic dizziness this worse.  Labs/ tests ordered today include:  Orders Placed This Encounter  Procedures  . EKG 12-Lead     Disposition:   FU with HS in 1 year  Signed, Sinclair Grooms, MD  10/24/2015 11:13 AM    [facility] [address], [place], [place]  [address] Phone: [phone]; Fax: [phone]

## 2015-10-24 NOTE — Patient Instructions (Signed)

## 2015-11-14 ENCOUNTER — Ambulatory Visit (INDEPENDENT_AMBULATORY_CARE_PROVIDER_SITE_OTHER): Payer: Medicare Other | Admitting: *Deleted

## 2015-11-14 DIAGNOSIS — I482 Chronic atrial fibrillation, unspecified: Secondary | ICD-10-CM

## 2015-11-14 DIAGNOSIS — I4891 Unspecified atrial fibrillation: Secondary | ICD-10-CM

## 2015-11-14 DIAGNOSIS — Z5181 Encounter for therapeutic drug level monitoring: Secondary | ICD-10-CM

## 2015-11-14 LAB — POCT INR: INR: 2.2

## 2015-11-19 ENCOUNTER — Other Ambulatory Visit: Payer: Self-pay

## 2015-11-19 MED ORDER — LISINOPRIL 20 MG PO TABS: 20.0000 mg | ORAL_TABLET | Freq: Every day | ORAL | Status: DC

## 2015-11-23 DIAGNOSIS — G039 Meningitis, unspecified: Secondary | ICD-10-CM | POA: Diagnosis not present

## 2015-11-23 DIAGNOSIS — M4726 Other spondylosis with radiculopathy, lumbar region: Secondary | ICD-10-CM | POA: Diagnosis not present

## 2015-11-23 DIAGNOSIS — Z6828 Body mass index (BMI) 28.0-28.9, adult: Secondary | ICD-10-CM | POA: Diagnosis not present

## 2015-11-23 DIAGNOSIS — M5136 Other intervertebral disc degeneration, lumbar region: Secondary | ICD-10-CM | POA: Diagnosis not present

## 2015-11-23 DIAGNOSIS — Z981 Arthrodesis status: Secondary | ICD-10-CM | POA: Diagnosis not present

## 2015-12-16 ENCOUNTER — Other Ambulatory Visit: Payer: Self-pay | Admitting: Family Medicine

## 2015-12-17 NOTE — Telephone Encounter (Signed)
See refill request.

## 2015-12-26 ENCOUNTER — Ambulatory Visit (INDEPENDENT_AMBULATORY_CARE_PROVIDER_SITE_OTHER): Payer: Medicare Other | Admitting: *Deleted

## 2015-12-26 DIAGNOSIS — I4891 Unspecified atrial fibrillation: Secondary | ICD-10-CM

## 2015-12-26 DIAGNOSIS — I482 Chronic atrial fibrillation, unspecified: Secondary | ICD-10-CM

## 2015-12-26 DIAGNOSIS — Z5181 Encounter for therapeutic drug level monitoring: Secondary | ICD-10-CM | POA: Diagnosis not present

## 2015-12-26 LAB — POCT INR: INR: 2.4

## 2015-12-31 ENCOUNTER — Other Ambulatory Visit: Payer: Self-pay | Admitting: *Deleted

## 2015-12-31 MED ORDER — EPLERENONE 25 MG PO TABS: ORAL_TABLET | ORAL | Status: DC

## 2016-02-06 ENCOUNTER — Ambulatory Visit (INDEPENDENT_AMBULATORY_CARE_PROVIDER_SITE_OTHER): Payer: Medicare Other | Admitting: *Deleted

## 2016-02-06 DIAGNOSIS — I4891 Unspecified atrial fibrillation: Secondary | ICD-10-CM

## 2016-02-06 DIAGNOSIS — I482 Chronic atrial fibrillation, unspecified: Secondary | ICD-10-CM

## 2016-02-06 DIAGNOSIS — Z5181 Encounter for therapeutic drug level monitoring: Secondary | ICD-10-CM

## 2016-02-06 LAB — POCT INR: INR: 2

## 2016-03-19 ENCOUNTER — Ambulatory Visit (INDEPENDENT_AMBULATORY_CARE_PROVIDER_SITE_OTHER): Payer: Medicare Other | Admitting: *Deleted

## 2016-03-19 DIAGNOSIS — Z5181 Encounter for therapeutic drug level monitoring: Secondary | ICD-10-CM | POA: Diagnosis not present

## 2016-03-19 DIAGNOSIS — I482 Chronic atrial fibrillation, unspecified: Secondary | ICD-10-CM

## 2016-03-19 DIAGNOSIS — I4891 Unspecified atrial fibrillation: Secondary | ICD-10-CM | POA: Diagnosis not present

## 2016-03-19 LAB — POCT INR: INR: 2

## 2016-03-26 ENCOUNTER — Other Ambulatory Visit: Payer: Self-pay | Admitting: Interventional Cardiology

## 2016-03-26 DIAGNOSIS — L820 Inflamed seborrheic keratosis: Secondary | ICD-10-CM | POA: Diagnosis not present

## 2016-03-26 DIAGNOSIS — D239 Other benign neoplasm of skin, unspecified: Secondary | ICD-10-CM

## 2016-03-26 DIAGNOSIS — D1800 Hemangioma unspecified site: Secondary | ICD-10-CM | POA: Diagnosis not present

## 2016-03-26 DIAGNOSIS — D2271 Melanocytic nevi of right lower limb, including hip: Secondary | ICD-10-CM | POA: Diagnosis not present

## 2016-03-26 DIAGNOSIS — L812 Freckles: Secondary | ICD-10-CM | POA: Diagnosis not present

## 2016-03-26 DIAGNOSIS — D229 Melanocytic nevi, unspecified: Secondary | ICD-10-CM | POA: Diagnosis not present

## 2016-03-26 DIAGNOSIS — L821 Other seborrheic keratosis: Secondary | ICD-10-CM | POA: Diagnosis not present

## 2016-03-26 DIAGNOSIS — Z1283 Encounter for screening for malignant neoplasm of skin: Secondary | ICD-10-CM | POA: Diagnosis not present

## 2016-03-26 DIAGNOSIS — L719 Rosacea, unspecified: Secondary | ICD-10-CM | POA: Diagnosis not present

## 2016-03-26 DIAGNOSIS — D485 Neoplasm of uncertain behavior of skin: Secondary | ICD-10-CM | POA: Diagnosis not present

## 2016-03-26 DIAGNOSIS — Z85828 Personal history of other malignant neoplasm of skin: Secondary | ICD-10-CM | POA: Diagnosis not present

## 2016-03-26 DIAGNOSIS — L578 Other skin changes due to chronic exposure to nonionizing radiation: Secondary | ICD-10-CM | POA: Diagnosis not present

## 2016-03-26 HISTORY — DX: Other benign neoplasm of skin, unspecified: D23.9

## 2016-04-02 ENCOUNTER — Ambulatory Visit (INDEPENDENT_AMBULATORY_CARE_PROVIDER_SITE_OTHER): Payer: Medicare Other | Admitting: *Deleted

## 2016-04-02 DIAGNOSIS — Z5181 Encounter for therapeutic drug level monitoring: Secondary | ICD-10-CM | POA: Diagnosis not present

## 2016-04-02 DIAGNOSIS — I4891 Unspecified atrial fibrillation: Secondary | ICD-10-CM

## 2016-04-02 LAB — POCT INR: INR: 1.9

## 2016-04-08 ENCOUNTER — Other Ambulatory Visit: Payer: Self-pay | Admitting: Interventional Cardiology

## 2016-04-16 ENCOUNTER — Ambulatory Visit (INDEPENDENT_AMBULATORY_CARE_PROVIDER_SITE_OTHER): Payer: Medicare Other | Admitting: *Deleted

## 2016-04-16 DIAGNOSIS — Z5181 Encounter for therapeutic drug level monitoring: Secondary | ICD-10-CM | POA: Diagnosis not present

## 2016-04-16 DIAGNOSIS — I4891 Unspecified atrial fibrillation: Secondary | ICD-10-CM

## 2016-04-16 LAB — POCT INR: INR: 3.5

## 2016-04-30 ENCOUNTER — Ambulatory Visit (INDEPENDENT_AMBULATORY_CARE_PROVIDER_SITE_OTHER): Payer: Medicare Other | Admitting: *Deleted

## 2016-04-30 DIAGNOSIS — I4891 Unspecified atrial fibrillation: Secondary | ICD-10-CM

## 2016-04-30 DIAGNOSIS — Z5181 Encounter for therapeutic drug level monitoring: Secondary | ICD-10-CM | POA: Diagnosis not present

## 2016-04-30 LAB — POCT INR: INR: 2.7

## 2016-05-13 ENCOUNTER — Telehealth: Payer: Self-pay

## 2016-05-13 ENCOUNTER — Telehealth: Payer: Self-pay | Admitting: *Deleted

## 2016-05-13 ENCOUNTER — Ambulatory Visit (INDEPENDENT_AMBULATORY_CARE_PROVIDER_SITE_OTHER): Payer: Medicare Other | Admitting: Family Medicine

## 2016-05-13 ENCOUNTER — Encounter: Payer: Self-pay | Admitting: Family Medicine

## 2016-05-13 VITALS — BP 100/56 | HR 80 | Temp 97.8°F | Resp 20 | Wt 186.0 lb

## 2016-05-13 DIAGNOSIS — J40 Bronchitis, not specified as acute or chronic: Secondary | ICD-10-CM | POA: Diagnosis not present

## 2016-05-13 DIAGNOSIS — I2510 Atherosclerotic heart disease of native coronary artery without angina pectoris: Secondary | ICD-10-CM | POA: Diagnosis not present

## 2016-05-13 DIAGNOSIS — Z23 Encounter for immunization: Secondary | ICD-10-CM | POA: Diagnosis not present

## 2016-05-13 MED ORDER — AZITHROMYCIN 250 MG PO TABS: ORAL_TABLET | ORAL | 0 refills | Status: AC

## 2016-05-13 MED ORDER — PREDNISONE 20 MG PO TABS: 20.0000 mg | ORAL_TABLET | Freq: Two times a day (BID) | ORAL | 0 refills | Status: AC

## 2016-05-13 NOTE — Progress Notes (Signed)
Patient: Larry Bautista Male    DOB: May 03, 1941   75 y.o.   MRN: [id number] Visit Date: 05/13/2016  Today's Provider: Lelon Huh, MD   Chief Complaint  Patient presents with  . URI   Subjective:    HPI Upper Respiratory Infection: Patient complains of symptoms of a URI. Symptoms include cough and sore throat. Onset of symptoms was a few weeks ago, gradually worsening since that time. He also c/o congestion and fever 100 for the past 1 congestion and fever 100 last night .  He is drinking plenty of fluids. Evaluation to date: none. Treatment to date: cough suppressants. Has had mild asthma all his life.  Was prescribed Advair and Proventil for bronchitis in December which he didn't tolerate. Has been having some wheezing     Allergies  Allergen Reactions  . Simvastatin Other (See Comments)    Leg pain  . Celecoxib Rash     Current Outpatient Prescriptions:  .  aspirin 81 MG tablet, Take 81 mg by mouth daily., Disp: , Rfl:  .  diphenhydramine-acetaminophen (TYLENOL PM) 25-500 MG TABS, Take 1 tablet by mouth at bedtime as needed., Disp: , Rfl:  .  eplerenone (INSPRA) 25 MG tablet, TAKE 1 TABLET (25 MG TOTAL) BY MOUTH DAILY., Disp: 30 tablet, Rfl: 9 .  fish oil-omega-3 fatty acids 1000 MG capsule, Take 2 capsules by mouth daily. , Disp: , Rfl:  .  fluticasone (FLONASE) 50 MCG/ACT nasal spray, USE 2 SPRAYS IN EACH NOSTRIL EVERY DAY, Disp: 48 g, Rfl: 3 .  gabapentin (NEURONTIN) 300 MG capsule, Take 300 mg by mouth 3 (three) times daily., Disp: , Rfl:  .  lisinopril (PRINIVIL,ZESTRIL) 20 MG tablet, Take 1 tablet (20 mg total) by mouth daily., Disp: 90 tablet, Rfl: 3 .  meclizine (ANTIVERT) 25 MG tablet, TAKE 1 TABLET 3 TIMES A DAY AS NEEDED VERTIGO, Disp: 21 tablet, Rfl: 0 .  metoprolol succinate (TOPROL-XL) 25 MG 24 hr tablet, TAKE ONE (1) TABLET EACH DAY, Disp: 90 tablet, Rfl: 1 .  multivitamin (THERAGRAN) per tablet, Take 1 tablet by mouth daily., Disp: , Rfl:  .   NEOMYCIN-POLYMYXIN-HYDROCORTISONE (CORTISPORIN) 1 % SOLN otic solution, Place 2 drops into both ears daily as needed., Disp: , Rfl: 0 .  nitroGLYCERIN (NITROSTAT) 0.4 MG SL tablet, Place 0.4 mg under the tongue every 5 (five) minutes as needed., Disp: , Rfl:  .  pravastatin (PRAVACHOL) 80 MG tablet, TAKE ONE (1) TABLET EACH DAY, Disp: 90 tablet, Rfl: 1 .  psyllium (METAMUCIL) 58.6 % powder, Take 1 packet by mouth daily. , Disp: , Rfl:  .  tamsulosin (FLOMAX) 0.4 MG CAPS capsule, Take 0.4 mg by mouth daily after supper., Disp: , Rfl:  .  vitamin C (ASCORBIC ACID) 500 MG tablet, Take 500 mg by mouth daily., Disp: , Rfl:  .  warfarin (COUMADIN) 5 MG tablet, TAKE AS DIRECTED BY THE COUMADIN CLINIC, Disp: 70 tablet, Rfl: 0  Review of Systems  Constitutional: Positive for fever.  HENT: Positive for congestion and sore throat.   Respiratory: Positive for cough, shortness of breath and wheezing.     Social History  Substance Use Topics  . Smoking status: Former Smoker    Types: Cigarettes    Quit date: 11/06/1996  . Smokeless tobacco: Never Used  . Alcohol use No   Objective:   BP (!) 100/56 (BP Location: Left Arm, Patient Position: Sitting, Cuff Size: Large)   Pulse 80  Temp 97.8 F (36.6 C) (Oral)   Resp 20   Wt 186 lb (84.4 kg)   SpO2 100%   BMI 28.28 kg/m   Physical Exam  General Appearance:    Alert, cooperative, no distress  HENT:   bilateral TM normal without fluid or infection, neck without nodes, throat normal without erythema or exudate, sinuses nontender, post nasal drip noted and nasal mucosa pale and congested  Eyes:    PERRL, conjunctiva/corneas clear, EOM's intact       Lungs:     Occasional expiratory wheeze, no rales, respirations unlabored  Heart:    Regular rate and rhythm  Neurologic:   Awake, alert, oriented x 3. No apparent focal neurological           defect.           Assessment & Plan:     1. Bronchitis  - predniSONE (DELTASONE) 20 MG tablet; Take 1  tablet (20 mg total) by mouth 2 (two) times daily.  Dispense: 10 tablet; Refill: 0 - azithromycin (ZITHROMAX) 250 MG tablet; 2 by mouth today, then 1 daily for 4 days  Dispense: 6 tablet; Refill: 0        Lelon Huh, MD  [facility]

## 2016-05-13 NOTE — Telephone Encounter (Signed)
Have patient reduce warfarin to 2.5mg  a day until finished with Zpack. Thanks.

## 2016-05-13 NOTE — Telephone Encounter (Signed)
Larry Bautista with [facility] called stating there is a drug interaction between pt's Coumadin and the Z Pak you prescribed earlier today. Please advise. CB# [phone]. Renaldo Fiddler, CMA

## 2016-05-13 NOTE — Telephone Encounter (Signed)
Larry Bautista from [facility] reports that pt called the coumadin clinic in [place] and they have adjusted medication for him.

## 2016-05-13 NOTE — Telephone Encounter (Signed)
Pt called stating he is being placed on Azithromycin tabs 2 today then 1 tab daily times 4 days and Prednisone 20 mg bid for 5 days for asthmatic bronchitis . Pt instructed to take the Zpak and Prednisone and his coumadin as ordered and made an appt for his INR to be rechecked on Friday Sept 8th as there is an interaction between Prednisone and Coumadin and pt states understanding

## 2016-05-16 ENCOUNTER — Ambulatory Visit (INDEPENDENT_AMBULATORY_CARE_PROVIDER_SITE_OTHER): Payer: Medicare Other | Admitting: *Deleted

## 2016-05-16 DIAGNOSIS — Z5181 Encounter for therapeutic drug level monitoring: Secondary | ICD-10-CM | POA: Diagnosis not present

## 2016-05-16 DIAGNOSIS — I4891 Unspecified atrial fibrillation: Secondary | ICD-10-CM | POA: Diagnosis not present

## 2016-05-16 LAB — POCT INR: INR: 4.3

## 2016-05-29 ENCOUNTER — Ambulatory Visit (INDEPENDENT_AMBULATORY_CARE_PROVIDER_SITE_OTHER): Payer: Medicare Other | Admitting: *Deleted

## 2016-05-29 DIAGNOSIS — Z5181 Encounter for therapeutic drug level monitoring: Secondary | ICD-10-CM | POA: Diagnosis not present

## 2016-05-29 DIAGNOSIS — I4891 Unspecified atrial fibrillation: Secondary | ICD-10-CM | POA: Diagnosis not present

## 2016-05-29 LAB — POCT INR: INR: 3.9

## 2016-06-12 ENCOUNTER — Ambulatory Visit (INDEPENDENT_AMBULATORY_CARE_PROVIDER_SITE_OTHER): Payer: Medicare Other | Admitting: *Deleted

## 2016-06-12 DIAGNOSIS — Z5181 Encounter for therapeutic drug level monitoring: Secondary | ICD-10-CM

## 2016-06-12 DIAGNOSIS — I4891 Unspecified atrial fibrillation: Secondary | ICD-10-CM | POA: Diagnosis not present

## 2016-06-12 LAB — POCT INR: INR: 2.7

## 2016-06-16 ENCOUNTER — Telehealth: Payer: Self-pay | Admitting: Family Medicine

## 2016-06-16 NOTE — Telephone Encounter (Signed)
lvm to schedule cpe and awv 11/10 2pm -knb

## 2016-06-25 ENCOUNTER — Other Ambulatory Visit: Payer: Self-pay | Admitting: Interventional Cardiology

## 2016-06-26 ENCOUNTER — Telehealth: Payer: Self-pay | Admitting: Family Medicine

## 2016-06-26 NOTE — Telephone Encounter (Signed)
Patient will call back, spoke briefly with him this morning and he was in the car. Called pt to re-schedule his 10/25 appt since [facility] can only take 4 pt a day for now. Please move him to 10/31 slot if available or any other day that she does not already have 4 AWVs scheduled. Thanks, knb

## 2016-07-02 ENCOUNTER — Ambulatory Visit: Payer: Medicare Other

## 2016-07-02 ENCOUNTER — Ambulatory Visit (INDEPENDENT_AMBULATORY_CARE_PROVIDER_SITE_OTHER): Payer: Medicare Other | Admitting: Pharmacist

## 2016-07-02 DIAGNOSIS — I4891 Unspecified atrial fibrillation: Secondary | ICD-10-CM

## 2016-07-02 DIAGNOSIS — Z5181 Encounter for therapeutic drug level monitoring: Secondary | ICD-10-CM

## 2016-07-02 LAB — POCT INR: INR: 3.3

## 2016-07-09 ENCOUNTER — Ambulatory Visit (INDEPENDENT_AMBULATORY_CARE_PROVIDER_SITE_OTHER): Payer: Medicare Other | Admitting: Family Medicine

## 2016-07-09 VITALS — BP 122/70 | HR 64 | Temp 97.8°F | Ht 67.0 in | Wt 187.0 lb

## 2016-07-09 DIAGNOSIS — Z0000 Encounter for general adult medical examination without abnormal findings: Secondary | ICD-10-CM | POA: Diagnosis not present

## 2016-07-09 NOTE — Progress Notes (Signed)
Subjective:   Larry Bautista is a 75 y.o. male who presents for Medicare Annual/Subsequent preventive examination.  Review of Systems:  N/A Cardiac Risk Factors include: advanced age (>65men, >67 women);dyslipidemia;male gender     Objective:    Vitals: BP 122/70 (BP Location: Left Arm)   Pulse 64   Temp 97.8 F (36.6 C) (Oral)   Ht 5\' 7"  (1.702 m)   Wt 187 lb (84.8 kg)   BMI 29.29 kg/m   Body mass index is 29.29 kg/m.  Tobacco History  Smoking Status  . Former Smoker  . Types: Cigarettes  . Quit date: 11/06/1996  Smokeless Tobacco  . Never Used     Counseling given: No   Past Medical History:  Diagnosis Date  . Allergy   . CHF (congestive heart failure) ([place])   . Chronic knee pain   . Coronary artery disease    with LAD DE stent 2004  . Hyperlipidemia   . Hypertension   . Kidney stones   . Lower back pain    status post surgery for spondylolisthesis  . Migraine   . Persistent atrial fibrillation ([place])    longstanding persistent (since 05/2008)   Past Surgical History:  Procedure Laterality Date  . BACK SURGERY     spondylolisthesis  . CARDIAC CATHETERIZATION  03/06/2003  . CORONARY ANGIOPLASTY WITH STENT PLACEMENT  04/2003   CYPHER stent implantation in the LAD  . HAND SURGERY Right 2014   carpal tunnel  . SKIN CANCER EXCISION  1980's   Face  . VASCULAR SURGERY     Vascular Stent   Family History  Problem Relation Age of Onset  . Heart attack Father   . Colon cancer Father   . Congestive Heart Failure Father   . Atrial fibrillation Brother   . Stroke Brother   . Lung cancer Mother   . Atrial fibrillation Brother   . COPD Brother   . Congestive Heart Failure Brother   . Lung cancer Brother    History  Sexual Activity  . Sexual activity: Not on file    Outpatient Encounter Prescriptions as of 07/09/2016  Medication Sig  . aspirin 81 MG tablet Take 81 mg by mouth daily.  . diphenhydramine-acetaminophen (TYLENOL PM) 25-500 MG TABS Take  1 tablet by mouth at bedtime as needed.  [name] eplerenone (INSPRA) 25 MG tablet TAKE 1 TABLET (25 MG TOTAL) BY MOUTH DAILY.  . fish oil-omega-3 fatty acids 1000 MG capsule Take 2 capsules by mouth daily.   . fluticasone (FLONASE) 50 MCG/ACT nasal spray USE 2 SPRAYS IN EACH NOSTRIL EVERY DAY  . gabapentin (NEURONTIN) 300 MG capsule Take 300 mg by mouth 2 (two) times daily.   [name] lisinopril (PRINIVIL,ZESTRIL) 20 MG tablet Take 1 tablet (20 mg total) by mouth daily.  . meclizine (ANTIVERT) 25 MG tablet TAKE 1 TABLET 3 TIMES A DAY AS NEEDED VERTIGO  . metoprolol succinate (TOPROL-XL) 25 MG 24 hr tablet Take 1 tablet (25 mg total) by mouth daily.  . multivitamin (THERAGRAN) per tablet Take 1 tablet by mouth daily.  . NEOMYCIN-POLYMYXIN-HYDROCORTISONE (CORTISPORIN) 1 % SOLN otic solution Place 2 drops into both ears daily as needed.  . nitroGLYCERIN (NITROSTAT) 0.4 MG SL tablet Place 0.4 mg under the tongue every 5 (five) minutes as needed.  . pravastatin (PRAVACHOL) 80 MG tablet TAKE ONE (1) TABLET EACH DAY  . psyllium (METAMUCIL) 58.6 % powder Take 1 packet by mouth daily.   . tamsulosin (FLOMAX) 0.4 MG  CAPS capsule Take 0.4 mg by mouth daily after supper.  . vitamin C (ASCORBIC ACID) 500 MG tablet Take 500 mg by mouth daily.  [name] warfarin (COUMADIN) 5 MG tablet TAKE AS DIRECTED BY THE COUMADIN CLINIC   No facility-administered encounter medications on file as of 07/09/2016.     Activities of Daily Living In your present state of health, do you have any difficulty performing the following activities: 07/09/2016  Hearing? Y  Vision? N  Difficulty concentrating or making decisions? N  Walking or climbing stairs? Y  Dressing or bathing? N  Doing errands, shopping? N  Preparing Food and eating ? N  Using the Toilet? N  In the past six months, have you accidently leaked urine? N  Do you have problems with loss of bowel control? N  Managing your Medications? N  Managing your Finances? N  Housekeeping  or managing your Housekeeping? N  Some recent data might be hidden    Patient Care Team: [name], MD as PCP - General (Family Medicine) Belva Crome, MD as Consulting Physician (Cardiology) Jovita Gamma, MD as Consulting Physician (Neurosurgery) Festus Aloe, MD as Consulting Physician (Urology)   Assessment:     Exercise Activities and Dietary recommendations Current Exercise Habits: The patient does not participate in regular exercise at present  Goals    . Exercise 150 minutes per week (moderate activity)    . Increase water intake          Starting 07/09/16, I will increase my water intake to 3 glasses a day.      Fall Risk Fall Risk  07/09/2016 05/13/2016 03/28/2015  Falls in the past year? No No No   Depression Screen PHQ 2/9 Scores 07/09/2016 05/13/2016 03/28/2015  PHQ - 2 Score 0 0 0    Cognitive Function     6CIT Screen 07/09/2016  What Year? 0 points  What month? 0 points  What time? 0 points  Count back from 20 0 points  Months in reverse 0 points  Repeat phrase 0 points  Total Score 0    Immunization History  Administered Date(s) Administered  . Influenza-Unspecified 06/04/2015  . Pneumococcal Conjugate-13 03/28/2015  . Tdap 05/17/2012  . Zoster 11/04/2005   Screening Tests Health Maintenance  Topic Date Due  . TETANUS/TDAP  05/17/2022  . INFLUENZA VACCINE  Completed  . ZOSTAVAX  Completed  . PNA vac Low Risk Adult  Completed      Plan:  I have personally reviewed and addressed the Medicare Annual Wellness questionnaire and have noted the following in the patient's chart:  A. Medical and social history B. Use of alcohol, tobacco or illicit drugs  C. Current medications and supplements D. Functional ability and status E.  Nutritional status F.  Physical activity G. Advance directives H. List of other physicians I.  Hospitalizations, surgeries, and ER visits in previous 12 months J.  [place] such as hearing and  vision if needed, cognitive and depression L. Referrals and appointments - none  In addition, I have reviewed and discussed with patient certain preventive protocols, quality metrics, and best practice recommendations. A written personalized care plan for preventive services as well as general preventive health recommendations were provided to patient.  See attached scanned questionnaire for additional information.   Signed,  Fabio Neighbors, LPN Nurse Health Advisor

## 2016-07-09 NOTE — Patient Instructions (Signed)
Mr. Kerbel , Thank you for taking time to come for your Medicare Wellness Visit. I appreciate your ongoing commitment to your health goals. Please review the following plan we discussed and let me know if I can assist you in the future.   These are the goals we discussed: Goals    . Exercise 150 minutes per week (moderate activity)    . Increase water intake          Starting 07/09/16, I will increase my water intake to 3 glasses a day.       This is a list of the screening recommended for you and due dates:  Health Maintenance  Topic Date Due  . Tetanus Vaccine  05/17/2022  . Flu Shot  Completed  . Shingles Vaccine  Completed  . Pneumonia vaccines  Completed   Preventive Care for Adults  A healthy lifestyle and preventive care can promote health and wellness. Preventive health guidelines for adults include the following key practices.  . A routine yearly physical is a good way to check with your health care provider about your health and preventive screening. It is a chance to share any concerns and updates on your health and to receive a thorough exam.  . Visit your dentist for a routine exam and preventive care every 6 months. Brush your teeth twice a day and floss once a day. Good oral hygiene prevents tooth decay and gum disease.  . The frequency of eye exams is based on your age, health, family medical history, use  of contact lenses, and other factors. Follow your health care provider's ecommendations for frequency of eye exams.  . Eat a healthy diet. Foods like vegetables, fruits, whole grains, low-fat dairy products, and lean protein foods contain the nutrients you need without too many calories. Decrease your intake of foods high in solid fats, added sugars, and salt. Eat the right amount of calories for you. Get information about a proper diet from your health care provider, if necessary.  . Regular physical exercise is one of the most important things you can do for  your health. Most adults should get at least 150 minutes of moderate-intensity exercise (any activity that increases your heart rate and causes you to sweat) each week. In addition, most adults need muscle-strengthening exercises on 2 or more days a week.  Silver Sneakers may be a benefit available to you. To determine eligibility, you may visit the website: www.silversneakers.com or contact program at 603-574-8249 Mon-Fri between 8AM-8PM.   . Maintain a healthy weight. The body mass index (BMI) is a screening tool to identify possible weight problems. It provides an estimate of body fat based on height and weight. Your health care provider can find your BMI and can help you achieve or maintain a healthy weight.   For adults 20 years and older: ? A BMI below 18.5 is considered underweight. ? A BMI of 18.5 to 24.9 is normal. ? A BMI of 25 to 29.9 is considered overweight. ? A BMI of 30 and above is considered obese.   . Maintain normal blood lipids and cholesterol levels by exercising and minimizing your intake of saturated fat. Eat a balanced diet with plenty of fruit and vegetables. Blood tests for lipids and cholesterol should begin at age 44 and be repeated every 5 years. If your lipid or cholesterol levels are high, you are over 50, or you are at high risk for heart disease, you may need your cholesterol levels checked more  frequently. Ongoing high lipid and cholesterol levels should be treated with medicines if diet and exercise are not working.  . If you smoke, find out from your health care provider how to quit. If you do not use tobacco, please do not start.  . If you choose to drink alcohol, please do not consume more than 2 drinks per day. One drink is considered to be 12 ounces (355 mL) of beer, 5 ounces (148 mL) of wine, or 1.5 ounces (44 mL) of liquor.  . If you are 24-76 years old, ask your health care provider if you should take aspirin to prevent strokes.  . Use sunscreen.  Apply sunscreen liberally and repeatedly throughout the day. You should seek shade when your shadow is shorter than you. Protect yourself by wearing long sleeves, pants, a wide-brimmed hat, and sunglasses year round, whenever you are outdoors.  . Once a month, do a whole body skin exam, using a mirror to look at the skin on your back. Tell your health care provider of new moles, moles that have irregular borders, moles that are larger than a pencil eraser, or moles that have changed in shape or color.

## 2016-07-16 ENCOUNTER — Ambulatory Visit (INDEPENDENT_AMBULATORY_CARE_PROVIDER_SITE_OTHER): Payer: Medicare Other | Admitting: *Deleted

## 2016-07-16 DIAGNOSIS — Z5181 Encounter for therapeutic drug level monitoring: Secondary | ICD-10-CM

## 2016-07-16 DIAGNOSIS — I4891 Unspecified atrial fibrillation: Secondary | ICD-10-CM

## 2016-07-16 LAB — POCT INR: INR: 2.1

## 2016-08-06 ENCOUNTER — Ambulatory Visit (INDEPENDENT_AMBULATORY_CARE_PROVIDER_SITE_OTHER): Payer: Medicare Other | Admitting: Pharmacist Clinician (PhC)/ Clinical Pharmacy Specialist

## 2016-08-06 ENCOUNTER — Ambulatory Visit (INDEPENDENT_AMBULATORY_CARE_PROVIDER_SITE_OTHER): Payer: Medicare Other | Admitting: Family Medicine

## 2016-08-06 ENCOUNTER — Encounter: Payer: Self-pay | Admitting: Family Medicine

## 2016-08-06 VITALS — BP 98/60 | HR 70 | Temp 98.0°F | Resp 16 | Ht 67.75 in | Wt 188.0 lb

## 2016-08-06 DIAGNOSIS — J984 Other disorders of lung: Secondary | ICD-10-CM | POA: Diagnosis not present

## 2016-08-06 DIAGNOSIS — I2510 Atherosclerotic heart disease of native coronary artery without angina pectoris: Secondary | ICD-10-CM

## 2016-08-06 DIAGNOSIS — R062 Wheezing: Secondary | ICD-10-CM

## 2016-08-06 DIAGNOSIS — Z5181 Encounter for therapeutic drug level monitoring: Secondary | ICD-10-CM

## 2016-08-06 DIAGNOSIS — R739 Hyperglycemia, unspecified: Secondary | ICD-10-CM | POA: Diagnosis not present

## 2016-08-06 DIAGNOSIS — I4891 Unspecified atrial fibrillation: Secondary | ICD-10-CM

## 2016-08-06 LAB — POCT GLYCOSYLATED HEMOGLOBIN (HGB A1C)
Est. average glucose Bld gHb Est-mCnc: 123
Hemoglobin A1C: 5.9

## 2016-08-06 LAB — POCT INR: INR: 2.3

## 2016-08-06 NOTE — Patient Instructions (Signed)
Try taking gabapentin 300mg  only at night.

## 2016-08-06 NOTE — Progress Notes (Signed)
Patient: Larry Bautista Male    DOB: [birth year]   75 y.o.   MRN: [id number] Visit Date: 08/06/2016  Today's Provider: Lelon Huh, MD   Chief Complaint  Patient presents with  . Hypertension    follow up  . Atrial Fibrillation    follow up  . Hyperglycemia    follow up   Subjective:    HPI  Hypertension, follow-up:  BP Readings from Last 3 Encounters:  07/09/16 122/70  05/13/16 (!) 100/56  10/24/15 126/82    He was last seen for hypertension  months ago. Hypertension is being follows by Cardiology. Patient states the next follow up is scheduled 09/2016 He reports good compliance with treatment. He is not having side effects.  He is not exercising. He is not adherent to low salt diet.   Outside blood pressures are not being checked. He is experiencing none.  Patient denies chest pain, chest pressure/discomfort, claudication, dyspnea, exertional chest pressure/discomfort, fatigue, irregular heart beat, lower extremity edema, near-syncope, orthopnea, palpitations, paroxysmal nocturnal dyspnea, syncope and tachypnea.   Cardiovascular risk factors include advanced age (older than 61 for men, 58 for women), hypertension and male gender.  Use of agents associated with hypertension: NSAIDS.     Weight trend: fluctuating a bit Wt Readings from Last 3 Encounters:  07/09/16 187 lb (84.8 kg)  05/13/16 186 lb (84.4 kg)  10/24/15 185 lb 1.9 oz (84 kg)    Current diet: well balanced  ------------------------------------------------------------------------ Follow up Atrial Fibrillation: Current problem is being followed by Cardiology, Dr. Tamala Julian  Patient was seen today during a coumadin clinic and had his PT/ INR checked by his Cardiologist.    Hyperglycemia, Follow-up:   Lab Results  Component Value Date   HGBA1C 6.0 (H) 03/28/2015   GLUCOSE 98 05/15/2015   GLUCOSE 93 03/28/2015   GLUCOSE 127 (H) 07/17/2009    Last seen for for this 1 years ago.    Management since then includes no changes. Current symptoms include none and have been stable.  Weight trend: fluctuating a bit Prior visit with dietician: no Current diet: well balanced Current exercise: none  Pertinent Labs:    Component Value Date/Time   CHOL 170 01/24/2015 0926   TRIG 134.0 01/24/2015 0926   CHOLHDL 3 01/24/2015 0926   CREATININE 0.65 (L) 05/15/2015 0849    Wt Readings from Last 3 Encounters:  07/09/16 187 lb (84.8 kg)  05/13/16 186 lb (84.4 kg)  10/24/15 185 lb 1.9 oz (84 kg)    He also complains of wheezing in his chest the last couple of years. Has gotten more persistent and associated with some dyspnea on exertion. He had chest Xr in 2015 which was unremarkable. He stopped smoking nearly 20 years ago with     Allergies  Allergen Reactions  . Simvastatin Other (See Comments)    Leg pain  . Celecoxib Rash     Current Outpatient Prescriptions:  .  aspirin 81 MG tablet, Take 81 mg by mouth daily., Disp: , Rfl:  .  diphenhydramine-acetaminophen (TYLENOL PM) 25-500 MG TABS, Take 1 tablet by mouth at bedtime as needed., Disp: , Rfl:  .  eplerenone (INSPRA) 25 MG tablet, TAKE 1 TABLET (25 MG TOTAL) BY MOUTH DAILY., Disp: 30 tablet, Rfl: 9 .  fish oil-omega-3 fatty acids 1000 MG capsule, Take 2 capsules by mouth daily. , Disp: , Rfl:  .  fluticasone (FLONASE) 50 MCG/ACT nasal spray, USE 2 SPRAYS IN EACH NOSTRIL EVERY  DAY, Disp: 48 g, Rfl: 3 .  gabapentin (NEURONTIN) 300 MG capsule, Take 300 mg by mouth 2 (two) times daily. , Disp: , Rfl:  .  lisinopril (PRINIVIL,ZESTRIL) 20 MG tablet, Take 1 tablet (20 mg total) by mouth daily., Disp: 90 tablet, Rfl: 3 .  meclizine (ANTIVERT) 25 MG tablet, TAKE 1 TABLET 3 TIMES A DAY AS NEEDED VERTIGO, Disp: 21 tablet, Rfl: 0 .  metoprolol succinate (TOPROL-XL) 25 MG 24 hr tablet, Take 1 tablet (25 mg total) by mouth daily., Disp: 90 tablet, Rfl: 0 .  multivitamin (THERAGRAN) per tablet, Take 1 tablet by mouth daily.,  Disp: , Rfl:  .  NEOMYCIN-POLYMYXIN-HYDROCORTISONE (CORTISPORIN) 1 % SOLN otic solution, Place 2 drops into both ears daily as needed., Disp: , Rfl: 0 .  nitroGLYCERIN (NITROSTAT) 0.4 MG SL tablet, Place 0.4 mg under the tongue every 5 (five) minutes as needed., Disp: , Rfl:  .  pravastatin (PRAVACHOL) 80 MG tablet, TAKE ONE (1) TABLET EACH DAY, Disp: 90 tablet, Rfl: 1 .  psyllium (METAMUCIL) 58.6 % powder, Take 1 packet by mouth daily. , Disp: , Rfl:  .  tamsulosin (FLOMAX) 0.4 MG CAPS capsule, Take 0.4 mg by mouth daily after supper., Disp: , Rfl:  .  vitamin C (ASCORBIC ACID) 500 MG tablet, Take 500 mg by mouth daily., Disp: , Rfl:  .  warfarin (COUMADIN) 5 MG tablet, TAKE AS DIRECTED BY THE COUMADIN CLINIC, Disp: 70 tablet, Rfl: 0  Review of Systems  Constitutional: Negative for appetite change, chills and fever.  Respiratory: Negative for chest tightness, shortness of breath and wheezing.   Cardiovascular: Negative for chest pain and palpitations.  Gastrointestinal: Negative for abdominal pain, nausea and vomiting.    Social History  Substance Use Topics  . Smoking status: Former Smoker    Types: Cigarettes    Quit date: 11/06/1996  . Smokeless tobacco: Never Used  . Alcohol use No   Objective:   BP 98/60 (BP Location: Left Arm, Patient Position: Sitting, Cuff Size: Normal)   Pulse 70   Temp 98 F (36.7 C) (Oral)   Resp 16   Ht 5' 7.75" (1.721 m)   Wt 188 lb (85.3 kg)   SpO2 95% Comment: room air  BMI 28.80 kg/m   Physical Exam   General Appearance:    Alert, cooperative, no distress  Eyes:    PERRL, conjunctiva/corneas clear, EOM's intact       Lungs:     Occasional expiratory wheeze, no rales, , respirations unlabored  Heart:     Irregularly irregular rhythm. Normal rate  Neurologic:   Awake, alert, oriented x 3. No apparent focal neurological           defect.         Results for orders placed or performed in visit on 08/06/16  POCT HgB A1C  Result Value Ref  Range   Hemoglobin A1C 5.9    Est. average glucose Bld gHb Est-mCnc 123     Spirometry: Moderately severe restriction    Assessment & Plan:     1. Hyperglycemia Check A1c at least annually.  - POCT HgB A1C  2. Wheezing Progressive over the last couple of years, no associated with some dyspnea, although underlying cardiac disease is likely contributing factor.   - Spirometry with graph - Ambulatory referral to Pulmonology  3. Restrictive lung disease New finding on spirometry today with unremarkable chest xr in 2015. Will have pulmonary evaluate.  - Ambulatory referral to Pulmonology  Lelon Huh, MD  [facility]

## 2016-08-19 ENCOUNTER — Telehealth: Payer: Self-pay | Admitting: Pulmonary Disease

## 2016-08-20 NOTE — Telephone Encounter (Signed)
Please advise if this pt can see SN or if they need to sched with another provider thanks

## 2016-08-21 ENCOUNTER — Other Ambulatory Visit: Payer: Self-pay | Admitting: Interventional Cardiology

## 2016-08-21 DIAGNOSIS — J309 Allergic rhinitis, unspecified: Secondary | ICD-10-CM

## 2016-08-21 NOTE — Telephone Encounter (Signed)
Needs to come from PCP.  Thanks! 

## 2016-08-22 NOTE — Telephone Encounter (Signed)
Per SN---   SN does not have any openings unitl Mid-Jan for consult---can see if another provider has an opening ---pt is for [place].  thanks

## 2016-08-22 NOTE — Telephone Encounter (Signed)
Tried to call pt. And inform him of SN message and see if we can get him scheduled with another provider. But the pt. Did have a vm set up. The phone did not ring either. Will hold in Triage and try to call pt. At another time.

## 2016-08-26 NOTE — Telephone Encounter (Signed)
lmomtcb x 1 for the pt to schedule an appt for him.

## 2016-08-27 NOTE — Telephone Encounter (Signed)
Pt returned call and can be reached` @ [phone].Larry Bautista

## 2016-08-28 NOTE — Telephone Encounter (Signed)
lmtcb x1 for pt. 

## 2016-09-02 NOTE — Telephone Encounter (Signed)
Pt needs to be set up for a consult in [place] with a [facility] provider. lmtcb for pt.

## 2016-09-03 NOTE — Telephone Encounter (Signed)
lmtcb for pt- pt needs to be scheduled for consult in [place] when he calls back.

## 2016-09-09 NOTE — Telephone Encounter (Signed)
LMTCB and will close per triage protocol 

## 2016-09-11 ENCOUNTER — Ambulatory Visit (INDEPENDENT_AMBULATORY_CARE_PROVIDER_SITE_OTHER): Payer: Medicare Other | Admitting: *Deleted

## 2016-09-11 DIAGNOSIS — I4891 Unspecified atrial fibrillation: Secondary | ICD-10-CM

## 2016-09-11 DIAGNOSIS — Z5181 Encounter for therapeutic drug level monitoring: Secondary | ICD-10-CM

## 2016-09-11 LAB — POCT INR: INR: 1.8

## 2016-09-17 DIAGNOSIS — R3121 Asymptomatic microscopic hematuria: Secondary | ICD-10-CM | POA: Diagnosis not present

## 2016-09-17 DIAGNOSIS — R3911 Hesitancy of micturition: Secondary | ICD-10-CM | POA: Diagnosis not present

## 2016-09-17 DIAGNOSIS — N401 Enlarged prostate with lower urinary tract symptoms: Secondary | ICD-10-CM | POA: Diagnosis not present

## 2016-09-17 DIAGNOSIS — N5201 Erectile dysfunction due to arterial insufficiency: Secondary | ICD-10-CM | POA: Diagnosis not present

## 2016-09-26 ENCOUNTER — Other Ambulatory Visit: Payer: Self-pay | Admitting: Interventional Cardiology

## 2016-09-26 NOTE — Telephone Encounter (Signed)
Rx refill sent to pharmacy. 

## 2016-10-01 ENCOUNTER — Ambulatory Visit (INDEPENDENT_AMBULATORY_CARE_PROVIDER_SITE_OTHER): Payer: Medicare Other | Admitting: Pulmonary Disease

## 2016-10-01 VITALS — BP 110/74 | HR 58 | Temp 97.3°F | Ht 67.75 in | Wt 188.5 lb

## 2016-10-01 DIAGNOSIS — E782 Mixed hyperlipidemia: Secondary | ICD-10-CM

## 2016-10-01 DIAGNOSIS — R942 Abnormal results of pulmonary function studies: Secondary | ICD-10-CM | POA: Diagnosis not present

## 2016-10-01 DIAGNOSIS — I10 Essential (primary) hypertension: Secondary | ICD-10-CM

## 2016-10-01 DIAGNOSIS — Z87891 Personal history of nicotine dependence: Secondary | ICD-10-CM

## 2016-10-01 DIAGNOSIS — G8929 Other chronic pain: Secondary | ICD-10-CM

## 2016-10-01 DIAGNOSIS — N401 Enlarged prostate with lower urinary tract symptoms: Secondary | ICD-10-CM

## 2016-10-01 DIAGNOSIS — I480 Paroxysmal atrial fibrillation: Secondary | ICD-10-CM

## 2016-10-01 DIAGNOSIS — R0600 Dyspnea, unspecified: Secondary | ICD-10-CM | POA: Insufficient documentation

## 2016-10-01 DIAGNOSIS — R0609 Other forms of dyspnea: Secondary | ICD-10-CM | POA: Diagnosis not present

## 2016-10-01 DIAGNOSIS — M549 Dorsalgia, unspecified: Secondary | ICD-10-CM | POA: Diagnosis not present

## 2016-10-01 NOTE — Progress Notes (Signed)
Subjective:     Patient ID: Larry Bautista, male   DOB: 02/15/1941, 76 y.o.   MRN: [id number]  HPI 76 y/o WM referred by Dr. Juanetta Beets, [facility], for a pulmonary evaluation due to intermittent wheezing and an abnormal spirometry test..., [facility], for a pulmonary evaluation due to intermittent wheezing and an abnormal spirometry test...   ~  I reviewed old EPIC records for perstinent DATA>> ~  CXR 04/05/14 showed norm heart size, tortuous Ao, clear lungs- NAD, DDD in Tspine...  ~  Spirometry 08/06/16 in DrFisher's office> FVC=2.20 (57%), FEV1=1.62 (59%), %1sec=74%, mid-flows reduced at 59% predicted; This is c/w a mod restrictive ventilatory defect w/ superimposed small airways dis.  ~  October 01, 2016:  Initial pulmonary consultation by SN>      Json reports that he has been SOB & wheezing for ~23yr, the symptoms are intermittent but seem worse supine per pt; his PCP has treated him for bronchitis w/ antibiotics and Pred w/ improvement but the symptoms return... He notes SOB/ DOE but states it's due to "lack of exercise"- ADLs are OK, stairs are a problem, carrying a load, etc; he says stable & no real change over the last yr...   Smoking Hx>  he is an ex-smoker, started ~76, smoked regularly 1ppd 1ppd, transiently up to 2ppd, Quit 1991 at age 76 because "I didn't like being addicted"; total ~35 yrs smoking & ~40 pack-yr history...  Pulmonary Hx>  He denies hx lung problems until the last yr or so; denies hx pneumonia, TB or exposure, etc...  Medical Hx>  HBP, CAD w/ stent, AFib (followed by DrHSmith), HL, kidney stones/BPH, DJD/ LBP,   Family Hx>  Lung cancer in mother, heart dis & colon cancer in father, lung cancer & COPD in brother...  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  Current Meds>  No resp meds- on Coumadin, ToprolXL, Lisinopril, Eplerenone, Pravachol, Flomax, Neurontin    Immuniz status>  Epic records indicate that he needs 2017 Flu vaccine & Pneumovax-23 shot...  EXAM shows Afeb, VSS, O2sat=95% on RA;  HEENT-  neg, mallampati2;  Chest- clear x few basilar rhonchi, no consolidation;  Heart- Irreg AFib w/o m/r/g;  Abd- soft, nontender, neg;  Ext- neg w/o c/c/e;  Neuro- intact...   CXR 10/01/16> HE DID NOT GO TO XRAY FOR THE REQUESTED FILM.Marland KitchenMarland Kitchen  Ambulatory Oximetry 10/01/16> O2sat=97% on RA at rest w/ pulse=68/min;  He walked 3 laps in the office (185'each) w/ lowest O2sat=94% w/ pulse=98/min...  LABS 10/01/16> HE DID NOT GO TO THE LAB FOR THE REQUESTED BLOOD WORK...  Full PFTs > scheduled and pending IMP>>        DOE> likely multifactorial w/ components from lung/ heart/ deconditioning...      AbnSpirometry> it appears more restricted than obstructed, need to correlate to current imaging studies...      Ex-smoker> he quit in 1991 at age 14 w/ ~77 pack-yr smoking hx...      Cardiac issues> followed by DrHSmith-- HBP, CAD- s/p stent, AFib on coumadin...      Medical issues> followed by DrFisher-- HBP, HL, kidney stones/ BPH, DJD/ LBP PLAN>>       We need some further assessment on Larry Bautista's lungs w/ a current CXR, review blood work, and Full PFTs;  He does not desat w/ exercise;  The most important intervention at this point is to increase [facility] program[facility], [facility], or similar group;  We are holding off on additional meds until we can see this data.Marland KitchenMarland Kitchen  Past Medical History:  Diagnosis Date  . Allergy   . CHF (congestive heart failure) ([place])   . Chronic knee pain   . Coronary artery disease    with LAD DE stent 2004  . Hyperlipidemia   . Hypertension   . Kidney stones   . Lower back pain    status post surgery for spondylolisthesis  . Migraine   . Persistent atrial fibrillation ([place])    longstanding persistent (since 05/2008)    Past Surgical History:  Procedure Laterality Date  . BACK SURGERY     spondylolisthesis  . CARDIAC CATHETERIZATION  03/06/2003  . CORONARY ANGIOPLASTY WITH STENT PLACEMENT  04/2003   CYPHER stent implantation in the LAD  . HAND SURGERY  Right 2014   carpal tunnel  . SKIN CANCER EXCISION  1980's   Face  . VASCULAR SURGERY     Vascular Stent    Outpatient Encounter Prescriptions as of 10/01/2016  Medication Sig  . aspirin 81 MG tablet Take 81 mg by mouth daily.  . diphenhydramine-acetaminophen (TYLENOL PM) 25-500 MG TABS Take 1 tablet by mouth at bedtime as needed.  [name] eplerenone (INSPRA) 25 MG tablet TAKE 1 TABLET (25 MG TOTAL) BY MOUTH DAILY.  . fish oil-omega-3 fatty acids 1000 MG capsule Take 2 capsules by mouth daily.   . fluticasone (FLONASE) 50 MCG/ACT nasal spray USE 2 SPRAYS IN EACH NOSTRIL EVERY DAY  . gabapentin (NEURONTIN) 300 MG capsule Take 300 mg by mouth 2 (two) times daily.   [name] lisinopril (PRINIVIL,ZESTRIL) 20 MG tablet TAKE ONE (1) TABLET EACH DAY  . meclizine (ANTIVERT) 25 MG tablet TAKE 1 TABLET 3 TIMES A DAY AS NEEDED VERTIGO  . metoprolol succinate (TOPROL-XL) 25 MG 24 hr tablet TAKE ONE (1) TABLET EACH DAY  . multivitamin (THERAGRAN) per tablet Take 1 tablet by mouth daily.  . NEOMYCIN-POLYMYXIN-HYDROCORTISONE (CORTISPORIN) 1 % SOLN otic solution Place 2 drops into both ears daily as needed.  . nitroGLYCERIN (NITROSTAT) 0.4 MG SL tablet Place 0.4 mg under the tongue every 5 (five) minutes as needed.  . pravastatin (PRAVACHOL) 80 MG tablet TAKE ONE (1) TABLET EACH DAY  . psyllium (METAMUCIL) 58.6 % powder Take 1 packet by mouth daily.   . vitamin C (ASCORBIC ACID) 500 MG tablet Take 500 mg by mouth daily.  [name] warfarin (COUMADIN) 5 MG tablet TAKE AS DIRECTED BY THE COUMADIN CLINIC  . tamsulosin (FLOMAX) 0.4 MG CAPS capsule Take 0.4 mg by mouth daily after supper.   No facility-administered encounter medications on file as of 10/01/2016.     Allergies  Allergen Reactions  . Simvastatin Other (See Comments)    Leg pain  . Celecoxib Rash    Immunization History  Administered Date(s) Administered  . Influenza, High Dose Seasonal PF 05/01/2016  . Influenza-Unspecified 06/04/2015  . Pneumococcal  Conjugate-13 03/28/2015  . Tdap 05/17/2012  . Zoster 11/04/2005    Family History  Problem Relation Age of Onset  . Heart attack Father   . Colon cancer Father   . Congestive Heart Failure Father   . Atrial fibrillation Brother   . Stroke Brother   . Lung cancer Mother   . Atrial fibrillation Brother   . COPD Brother   . Congestive Heart Failure Brother   . Lung cancer Brother     Social History   Social History  . Marital status: Married    Spouse name: Datron Lopiano  . Number of children: 1  . Years of education:  N/A   Occupational History  .      works part-time Optician, dispensing   Social History Main Topics  . Smoking status: Former Smoker    Types: Cigarettes    Quit date: 11/06/1996  . Smokeless tobacco: Never Used  . Alcohol use No  . Drug use: No  . Sexual activity: Not on file   Other Topics Concern  . Not on file   Social History Narrative  . No narrative on file    Review of Systems             All symptoms NEG except where BOLDED >>  Constitutional:  F/C/S, fatigue, anorexia, unexpected weight change. HEENT:  HA, visual changes, hearing loss, earache, nasal symptoms, sore throat, mouth sores, hoarseness. Resp:  cough, sputum, hemoptysis; SOB, tightness, wheezing. Cardio:  CP, palpit, DOE, orthopnea, edema. GI:  N/V/D/C, blood in stool; reflux, abd pain, distention, gas. GU:  dysuria, freq, urgency, hematuria, flank pain, voiding difficulty. MS:  joint pain, swelling, tenderness, decr ROM; neck pain, back pain, etc. Neuro:  HA, tremors, seizures, dizziness, syncope, weakness, numbness, gait abn. Skin:  suspicious lesions or skin rash. Heme:  adenopathy, bruising, bleeding. Psyche:  confusion, agitation, sleep disturbance, hallucinations, anxiety, depression suicidal.   Objective:   Physical Exam         Vital Signs:  Reviewed...   General:  WD, WN, 76 y/o WM in NAD; alert & oriented; pleasant & cooperative... HEENT:  [place]/AT; Conjunctiva-  pink, Sclera- nonicteric, EOM-wnl, PERRLA, EACs-clear, TMs-wnl; NOSE-clear; THROAT-clear & wnl.  Neck:  Supple w/ fair ROM; no JVD; normal carotid impulses w/o bruits; no thyromegaly or nodules palpated; no lymphadenopathy.  Chest:  Clear to P & A; without wheezes, rales, or rhonchi heard. Heart:  Irregular rhythm (AFIB); norm S1 & S2 without murmurs, rubs, or gallops detected. Abdomen:  Soft & nontender- no guarding or rebound; normal bowel sounds; no organomegaly or masses palpated. Ext:  Decr ROM; without deformities +arthritic changes; no varicose veins, +venous insuffic, tr edema;  Pulses intact w/o bruits. Neuro:  CNs II-XII intact; motor testing normal; sensory testing normal; gait normal & balance OK. Derm:  No lesions noted; no rash etc. Lymph:  No cervical, supraclavicular, axillary, or inguinal adenopathy palpated.   Assessment:      IMP>>        DOE> likely multifactorial w/ components from lung/ heart/ deconditioning...      AbnSpirometry> it appears more restricted than obstructed, need to correlate to current imaging studies...      Ex-smoker> he quit in 1991 at age 23 w/ ~11 pack-yr smoking hx...      Cardiac issues> followed by DrHSmith-- HBP, CAD- s/p stent, AFib on coumadin...      Medical issues> followed by DrFisher-- HBP, HL, kidney stones/ BPH, DJD/ LBP  PLAN>>       We need some further assessment on Bishoy's lungs w/ a current CXR, review blood work, and Full PFTs;  He does not desat w/ exercise;  The most important intervention at this point is to increase his exercise program- [facility], [facility]akers, or similar group;  We are holding off on additional meds until we can see this data...      Plan:     Patient's Medications  New Prescriptions   No medications on file  Previous Medications   ASPIRIN 81 MG TABLET    Take 81 mg by mouth daily.   DIPHENHYDRAMINE-ACETAMINOPHEN (TYLENOL PM) 25-500 MG TABS  Take 1 tablet by mouth at bedtime as needed.    EPLERENONE (INSPRA) 25 MG TABLET    TAKE 1 TABLET (25 MG TOTAL) BY MOUTH DAILY.   FISH OIL-OMEGA-3 FATTY ACIDS 1000 MG CAPSULE    Take 2 capsules by mouth daily.    FLUTICASONE (FLONASE) 50 MCG/ACT NASAL SPRAY    USE 2 SPRAYS IN EACH NOSTRIL EVERY DAY   GABAPENTIN (NEURONTIN) 300 MG CAPSULE    Take 300 mg by mouth 2 (two) times daily.    LISINOPRIL (PRINIVIL,ZESTRIL) 20 MG TABLET    TAKE ONE (1) TABLET EACH DAY   MECLIZINE (ANTIVERT) 25 MG TABLET    TAKE 1 TABLET 3 TIMES A DAY AS NEEDED VERTIGO   METOPROLOL SUCCINATE (TOPROL-XL) 25 MG 24 HR TABLET    TAKE ONE (1) TABLET EACH DAY   MULTIVITAMIN (THERAGRAN) PER TABLET    Take 1 tablet by mouth daily.   NEOMYCIN-POLYMYXIN-HYDROCORTISONE (CORTISPORIN) 1 % SOLN OTIC SOLUTION    Place 2 drops into both ears daily as needed.   NITROGLYCERIN (NITROSTAT) 0.4 MG SL TABLET    Place 0.4 mg under the tongue every 5 (five) minutes as needed.   PRAVASTATIN (PRAVACHOL) 80 MG TABLET    TAKE ONE (1) TABLET EACH DAY   PSYLLIUM (METAMUCIL) 58.6 % POWDER    Take 1 packet by mouth daily.    TAMSULOSIN (FLOMAX) 0.4 MG CAPS CAPSULE    Take 0.4 mg by mouth daily after supper.   VITAMIN C (ASCORBIC ACID) 500 MG TABLET    Take 500 mg by mouth daily.   WARFARIN (COUMADIN) 5 MG TABLET    TAKE AS DIRECTED BY THE COUMADIN CLINIC  Modified Medications   No medications on file  Discontinued Medications   No medications on file

## 2016-10-01 NOTE — Patient Instructions (Signed)
Loras-- it was nice meeting you today...  Today we updated your med list in our EPIC system...    Continue your current medications the same...  Today we checked a follow up CXR, blood work, & an ambulatory oxygen test... We will arrange for complete pulmonary function tests to be done...    We will review all these results and give you a call regarding ou findings & work on a treatment plan...  As we discussed the MOST important thing for you to do now--    Is to start a regular exercise program (Eg- at the [facility],, Silver Sneakers, etc)  Call for any questions...  Let's plan a follow up visit in about 41mo, sooner if needed for any breathing problems.Marland KitchenMarland Kitchen

## 2016-10-09 ENCOUNTER — Ambulatory Visit (INDEPENDENT_AMBULATORY_CARE_PROVIDER_SITE_OTHER): Payer: Medicare Other | Admitting: *Deleted

## 2016-10-09 DIAGNOSIS — Z5181 Encounter for therapeutic drug level monitoring: Secondary | ICD-10-CM | POA: Diagnosis not present

## 2016-10-09 DIAGNOSIS — I4891 Unspecified atrial fibrillation: Secondary | ICD-10-CM

## 2016-10-09 LAB — POCT INR: INR: 2.3

## 2016-10-14 ENCOUNTER — Other Ambulatory Visit: Payer: Self-pay | Admitting: Interventional Cardiology

## 2016-10-29 ENCOUNTER — Other Ambulatory Visit: Payer: Self-pay | Admitting: Interventional Cardiology

## 2016-10-31 ENCOUNTER — Encounter: Payer: Self-pay | Admitting: Interventional Cardiology

## 2016-10-31 ENCOUNTER — Ambulatory Visit (INDEPENDENT_AMBULATORY_CARE_PROVIDER_SITE_OTHER): Payer: Medicare Other | Admitting: Interventional Cardiology

## 2016-10-31 VITALS — BP 146/88 | HR 64 | Ht 68.0 in | Wt 185.8 lb

## 2016-10-31 DIAGNOSIS — I480 Paroxysmal atrial fibrillation: Secondary | ICD-10-CM | POA: Diagnosis not present

## 2016-10-31 DIAGNOSIS — I10 Essential (primary) hypertension: Secondary | ICD-10-CM | POA: Diagnosis not present

## 2016-10-31 DIAGNOSIS — E784 Other hyperlipidemia: Secondary | ICD-10-CM | POA: Diagnosis not present

## 2016-10-31 DIAGNOSIS — I2510 Atherosclerotic heart disease of native coronary artery without angina pectoris: Secondary | ICD-10-CM | POA: Diagnosis not present

## 2016-10-31 DIAGNOSIS — E7849 Other hyperlipidemia: Secondary | ICD-10-CM

## 2016-10-31 NOTE — Patient Instructions (Signed)
Medication Instructions:  None  Labwork: Your physician recommends that you return for lab work within the next 2 weeks (BMET, CBC)   Testing/Procedures: None  Follow-Up: Your physician wants you to follow-up in: 1 year with Dr. Tamala Julian.  You will receive a reminder letter in the mail two months in advance. If you don't receive a letter, please call our office to schedule the follow-up appointment.   Any Other Special Instructions Will Be Listed Below (If Applicable).  Have your wife observe you sleep.  If any episodes of hesitation in breathing, please contact our office.    If you need a refill on your cardiac medications before your next appointment, please call your pharmacy.

## 2016-10-31 NOTE — Progress Notes (Signed)
Cardiology Office Note    Date:  10/31/2016   ID:  Larry Bautista Bautista, DOB [birth year], MRN [id number]  PCP:  Larry Bautista Huh, MD  Cardiologist: Larry Bautista Grooms, MD   Chief Complaint  Patient presents with  . Atrial Fibrillation    History of Present Illness:  Larry Bautista Bautista is a 76 y.o. male who presents for  CAD, chronic atrial fibrillation, anticoagulation therapy, hypertension, and chronic diastolic heart failure.  Overall he feels well. He has not had syncope or lower extremity swelling. Denies chest pain and shortness of breath. There is no lower extremity edema.  Past Medical History:  Diagnosis Date  . Allergy   . CHF (congestive heart failure) ([place])   . Chronic knee pain   . Coronary artery disease    with LAD DE stent 2004  . Hyperlipidemia   . Hypertension   . Kidney stones   . Lower back pain    status post surgery for spondylolisthesis  . Migraine   . Persistent atrial fibrillation (Larry Bautista Bautista)    longstanding persistent (since 05/2008)    Past Surgical History:  Procedure Laterality Date  . BACK SURGERY     spondylolisthesis  . CARDIAC CATHETERIZATION  03/06/2003  . CORONARY ANGIOPLASTY WITH STENT PLACEMENT  04/2003   CYPHER stent implantation in the LAD  . HAND SURGERY Right 2014   carpal tunnel  . SKIN CANCER EXCISION  1980's   Face  . VASCULAR SURGERY     Vascular Stent    Current Medications: Outpatient Medications Prior to Visit  Medication Sig Dispense Refill  . aspirin 81 MG tablet Take 81 mg by mouth daily.    . diphenhydramine-acetaminophen (TYLENOL PM) 25-500 MG TABS Take 1 tablet by mouth at bedtime as needed.    [name] eplerenone (INSPRA) 25 MG tablet Take 1 tablet (25 mg total) by mouth daily. 30 tablet 0  . fish oil-omega-3 fatty acids 1000 MG capsule Take 2 capsules by mouth daily.     . fluticasone (FLONASE) 50 MCG/ACT nasal spray USE 2 SPRAYS IN EACH NOSTRIL EVERY DAY 48 g 3  . gabapentin (NEURONTIN) 300 MG capsule Take 300 mg by mouth 2  (two) times daily.     [name] lisinopril (PRINIVIL,ZESTRIL) 20 MG tablet TAKE ONE (1) TABLET EACH DAY 90 tablet 0  . meclizine (ANTIVERT) 25 MG tablet TAKE 1 TABLET 3 TIMES A DAY AS NEEDED VERTIGO 21 tablet 0  . metoprolol succinate (TOPROL-XL) 25 MG 24 hr tablet TAKE ONE (1) TABLET EACH DAY 90 tablet 0  . multivitamin (THERAGRAN) per tablet Take 1 tablet by mouth daily.    . NEOMYCIN-POLYMYXIN-HYDROCORTISONE (CORTISPORIN) 1 % SOLN otic solution Place 2 drops into both ears daily as needed.  0  . nitroGLYCERIN (NITROSTAT) 0.4 MG SL tablet Place 0.4 mg under the tongue every 5 (five) minutes as needed.    . pravastatin (PRAVACHOL) 80 MG tablet TAKE ONE (1) TABLET EACH DAY 90 tablet 1  . psyllium (METAMUCIL) 58.6 % powder Take 1 packet by mouth daily.     . vitamin C (ASCORBIC ACID) 500 MG tablet Take 500 mg by mouth daily.    [name] warfarin (COUMADIN) 5 MG tablet TAKE AS DIRECTED 30 tablet 3  . tamsulosin (FLOMAX) 0.4 MG CAPS capsule Take 0.4 mg by mouth daily after supper.     No facility-administered medications prior to visit.      Allergies:   Simvastatin and Celecoxib   Social History   Social History  .  Marital status: Married    Spouse name: Larry Bautista Bautista  . Number of children: 1  . Years of education: N/A   Occupational History  .      works part-time Optician, dispensing   Social History Main Topics  . Smoking status: Former Smoker    Types: Cigarettes    Quit date: 11/06/1996  . Smokeless tobacco: Never Used  . Alcohol use No  . Drug use: No  . Sexual activity: Not Asked   Other Topics Concern  . None   Social History Narrative  . None     Family History:  The patient's family history includes Atrial fibrillation in his brother and brother; COPD in his brother; Colon cancer in his father; Congestive Heart Failure in his brother and father; Heart attack in his father; Lung cancer in his brother and mother; Stroke in his brother.   ROS:   Please see the history of present  illness.    Wheezing, occasional feeling of irregular heartbeat. Snores loudly. Shortness of breath with activity.  All other systems reviewed and are negative.   PHYSICAL EXAM:   VS:  BP (!) 146/88 (BP Location: Left Arm)   Pulse 64   Ht 5\' 8"  (1.727 m)   Wt 185 lb 12.8 oz (84.3 kg)   BMI 28.25 kg/m    GEN: Well nourished, well developed, in no acute distress  HEENT: normal  Neck: no JVD, carotid bruits, or masses Cardiac: IIRR; no murmurs, rubs, or gallops,no edema  Respiratory:  clear to auscultation bilaterally, normal work of breathing GI: soft, nontender, nondistended, + BS MS: no deformity or atrophy  Skin: warm and dry, no rash Neuro:  Alert and Oriented x 3, Strength and sensation are intact Psych: euthymic mood, full affect  Wt Readings from Last 3 Encounters:  10/31/16 185 lb 12.8 oz (84.3 kg)  10/01/16 188 lb 8 oz (85.5 kg)  08/06/16 188 lb (85.3 kg)      Studies/Labs Reviewed:   EKG:  EKG  Atrial fibrillation with controlled rate is 64 bpm. No change compared to prior. Left axis deviation.  Recent Labs: No results found for requested labs within last 8760 hours.   Lipid Panel    Component Value Date/Time   CHOL 170 01/24/2015 0926   TRIG 134.0 01/24/2015 0926   HDL 59.90 01/24/2015 0926   CHOLHDL 3 01/24/2015 0926   VLDL 26.8 01/24/2015 0926   LDLCALC 83 01/24/2015 0926   LDLDIRECT 136.6 08/24/2013 0938    Additional studies/ records that were reviewed today include:  No new data    ASSESSMENT:    1. AF (paroxysmal atrial fibrillation) (Larry Bautista Bautista)   2. CAD in native artery   3. Essential hypertension   4. Other hyperlipidemia      PLAN:  In order of problems listed above:  1. Controlled rate. Atrial fibrillation is chronic. 2. Asymptomatic. No functional testing required at this time. 3. Blood pressure is well-controlled. 2 g sodium diet. Continue the above stated medications. 4. Followed by primary care.  For the first time, snoring is  identified as a significant medical problem. We have asked that his wife observed him to determine if he has any periods of apnea or struggling with breathing. If so he will need to have a sleep study. This could also be responsible for some of the patient's difficulty with exertional dyspnea. Otherwise, follow-up with me in one year. A comprehensive metabolic panel, CBC, and BMP are obtained today.  Medication  Adjustments/Labs and Tests Ordered: Current medicines are reviewed at length with the patient today.  Concerns regarding medicines are outlined above.  Medication changes, Labs and Tests ordered today are listed in the Patient Instructions below. There are no Patient Instructions on file for this visit.   Signed, Larry Bautista Grooms, MD  10/31/2016 5:01 PM    [facility] [address], [place], [place]  [address] Phone: [phone]; Fax: [phone]

## 2016-11-03 ENCOUNTER — Encounter: Payer: Self-pay | Admitting: Pulmonary Disease

## 2016-11-06 ENCOUNTER — Other Ambulatory Visit (INDEPENDENT_AMBULATORY_CARE_PROVIDER_SITE_OTHER): Payer: Medicare Other

## 2016-11-06 ENCOUNTER — Ambulatory Visit (INDEPENDENT_AMBULATORY_CARE_PROVIDER_SITE_OTHER)
Admission: RE | Admit: 2016-11-06 | Discharge: 2016-11-06 | Disposition: A | Discharge status: Home / Self Care | Payer: Medicare Other | Source: Ambulatory Visit | Attending: Pulmonary Disease | Admitting: Pulmonary Disease

## 2016-11-06 ENCOUNTER — Encounter: Payer: Self-pay | Admitting: Pulmonary Disease

## 2016-11-06 ENCOUNTER — Ambulatory Visit (INDEPENDENT_AMBULATORY_CARE_PROVIDER_SITE_OTHER): Payer: Medicare Other | Admitting: Pulmonary Disease

## 2016-11-06 ENCOUNTER — Other Ambulatory Visit: Payer: Self-pay | Admitting: Pulmonary Disease

## 2016-11-06 ENCOUNTER — Encounter (INDEPENDENT_AMBULATORY_CARE_PROVIDER_SITE_OTHER): Payer: Medicare Other | Admitting: Pulmonary Disease

## 2016-11-06 VITALS — BP 124/76 | HR 84 | Temp 97.3°F | Ht 68.0 in | Wt 186.0 lb

## 2016-11-06 DIAGNOSIS — I480 Paroxysmal atrial fibrillation: Secondary | ICD-10-CM | POA: Diagnosis not present

## 2016-11-06 DIAGNOSIS — R0609 Other forms of dyspnea: Secondary | ICD-10-CM | POA: Diagnosis not present

## 2016-11-06 DIAGNOSIS — E782 Mixed hyperlipidemia: Secondary | ICD-10-CM

## 2016-11-06 DIAGNOSIS — I10 Essential (primary) hypertension: Secondary | ICD-10-CM

## 2016-11-06 DIAGNOSIS — Z87891 Personal history of nicotine dependence: Secondary | ICD-10-CM

## 2016-11-06 DIAGNOSIS — N401 Enlarged prostate with lower urinary tract symptoms: Secondary | ICD-10-CM

## 2016-11-06 DIAGNOSIS — M4807 Spinal stenosis, lumbosacral region: Secondary | ICD-10-CM | POA: Diagnosis not present

## 2016-11-06 DIAGNOSIS — R942 Abnormal results of pulmonary function studies: Secondary | ICD-10-CM | POA: Diagnosis not present

## 2016-11-06 DIAGNOSIS — I2510 Atherosclerotic heart disease of native coronary artery without angina pectoris: Secondary | ICD-10-CM

## 2016-11-06 DIAGNOSIS — R05 Cough: Secondary | ICD-10-CM | POA: Diagnosis not present

## 2016-11-06 LAB — PULMONARY FUNCTION TEST
DL/VA % pred: 108 %
DL/VA: 4.83 ml/min/mmHg/L
DLCO UNC: 22.44 ml/min/mmHg
DLCO cor % pred: 79 %
DLCO cor: 23.58 ml/min/mmHg
DLCO unc % pred: 75 %
FEF 25-75 Post: 2.21 L/sec
FEF 25-75 Pre: 1.64 L/sec
FEF2575-%Change-Post: 34 %
FEF2575-%Pred-Post: 114 %
FEF2575-%Pred-Pre: 85 %
FEV1-%CHANGE-POST: 10 %
FEV1-%Pred-Post: 77 %
FEV1-%Pred-Pre: 70 %
FEV1-POST: 2.12 L
FEV1-PRE: 1.92 L
FEV1FVC-%Change-Post: -2 %
FEV1FVC-%Pred-Pre: 108 %
FEV6-%Change-Post: 13 %
FEV6-%PRED-POST: 77 %
FEV6-%PRED-PRE: 68 %
FEV6-POST: 2.77 L
FEV6-PRE: 2.45 L
FEV6FVC-%CHANGE-POST: 0 %
FEV6FVC-%PRED-POST: 107 %
FEV6FVC-%PRED-PRE: 107 %
FVC-%CHANGE-POST: 13 %
FVC-%PRED-POST: 72 %
FVC-%Pred-Pre: 64 %
FVC-POST: 2.77 L
FVC-Pre: 2.45 L
POST FEV6/FVC RATIO: 100 %
PRE FEV6/FVC RATIO: 100 %
Post FEV1/FVC ratio: 76 %
Pre FEV1/FVC ratio: 78 %
RV % PRED: 105 %
RV: 2.63 L
TLC % PRED: 79 %
TLC: 5.26 L

## 2016-11-06 LAB — COMPREHENSIVE METABOLIC PANEL
ALK PHOS: 48 U/L (ref 39–117)
ALT: 33 U/L (ref 0–53)
AST: 36 U/L (ref 0–37)
Albumin: 4.4 g/dL (ref 3.5–5.2)
BILIRUBIN TOTAL: 0.6 mg/dL (ref 0.2–1.2)
BUN: 16 mg/dL (ref 6–23)
CALCIUM: 9.6 mg/dL (ref 8.4–10.5)
CO2: 31 mEq/L (ref 19–32)
Chloride: 105 mEq/L (ref 96–112)
Creatinine, Ser: 0.8 mg/dL (ref 0.40–1.50)
GFR: 99.6 mL/min (ref >60.00)
Glucose, Bld: 98 mg/dL (ref 70–99)
POTASSIUM: 4.4 meq/L (ref 3.5–5.1)
Sodium: 143 mEq/L (ref 135–145)
TOTAL PROTEIN: 6.9 g/dL (ref 6.0–8.3)

## 2016-11-06 LAB — CBC WITH DIFFERENTIAL/PLATELET
BASOS ABS: 0.1 10*3/uL (ref 0.0–0.1)
Basophils Relative: 0.9 % (ref 0.0–3.0)
EOS PCT: 1.3 % (ref 0.0–5.0)
Eosinophils Absolute: 0.1 10*3/uL (ref 0.0–0.7)
HCT: 45.2 % (ref 39.0–52.0)
HEMOGLOBIN: 15.3 g/dL (ref 13.0–17.0)
Lymphocytes Relative: 24.8 % (ref 12.0–46.0)
Lymphs Abs: 2.1 10*3/uL (ref 0.7–4.0)
MCHC: 33.7 g/dL (ref 30.0–36.0)
MCV: 94.6 fl (ref 78.0–100.0)
MONOS PCT: 8.6 % (ref 3.0–12.0)
Monocytes Absolute: 0.7 10*3/uL (ref 0.1–1.0)
NEUTROS PCT: 64.4 % (ref 43.0–77.0)
Neutro Abs: 5.5 10*3/uL (ref 1.4–7.7)
Platelets: 263 10*3/uL (ref 150.0–400.0)
RBC: 4.79 Mil/uL (ref 4.22–5.81)
RDW: 13.2 % (ref 11.5–15.5)
WBC: 8.5 10*3/uL (ref 4.0–10.5)

## 2016-11-06 LAB — SEDIMENTATION RATE: Sed Rate: 2 mm/hr (ref 0–20)

## 2016-11-06 LAB — TSH: TSH: 0.47 u[IU]/mL (ref 0.35–4.50)

## 2016-11-06 NOTE — Patient Instructions (Signed)
Today we updated your med list in our EPIC system...    Continue your current medications the same...  We decided to STOP your LISINOPRIL due to your cough...   Monitor your BP at home & call w/ any changes or questions...  Concentrate on good deep breathing exercises + a regular aerobic exercise program as we discussed...  Today we reviewed your complete pulmonary function tests (mild restriction & not much obstruction)... We also ordered some BLOOD WORK & we will contact you w/ the results when available...  (I will send copies to DrSmith & DrFisher)  Call for any questions...  Let's plan a follow up visit in 63mo, sooner if needed for breathing problems.Marland KitchenMarland Kitchen

## 2016-11-06 NOTE — Progress Notes (Signed)
Subjective:     Patient ID: Larry Bautista, male   DOB: 12/04/1940, 76 y.o.   MRN: [id number]  HPI 76 y/o WM referred by Dr. Juanetta Beets, [facility], for a pulmonary evaluation due to intermittent wheezing and an abnormal spirometry test...   ~  I reviewed old EPIC records for pertinent DATA>> ~  CXR 04/05/14 showed norm heart size, tortuous Ao, clear lungs- NAD, DDD in [facility]...  ~  Spirometry 08/06/16 in DrFisher's office> FVC=2.20 (57%), FEV1=1.62 (59%), %1sec=74%, mid-flows reduced at 59% predicted; This is c/w a mod restrictive ventilatory defect w/ superimposed small airways dis.  ~  October 01, 2016:  Initial pulmonary consultation by SN>      Larry Bautista reports that he has been SOB & wheezing for ~27yr the symptoms are intermittent but seem worse supine per pt; his PCP has treated him for bronchitis w/ antibiotics and Pred w/ improvement but the symptoms return... He notes SOB/ DOE but states it's due to "lack of exercise"- ADLs are OK, stairs are a problem, carrying a load, etc; he says stable & no real change over the last yr... He's also noted a mild dry nagging cough & he is on an ACE for HBP and hx CAD...  Smoking Hx>  he is an ex-smoker, started ~15, smoked regularly 1ppd, transiently up to 2ppd, Quit 1991 at age [age]because "I didn't like being addicted"; total ~35 yrs smoking & ~40 pack-yr history...  Pulmonary Hx>  He denies hx lung problems until the last yr or so; denies hx asthma, pneumonia, TB or exposure, etc...  Medical Hx>  HBP, CAD w/ stent, AFib (followed by DrHSmith), HL, kidney stones/BPH, DJD/ LBP,   Family Hx>  Lung cancer in mother, heart dis & colon cancer in father, lung cancer & COPD in brother...  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  Current Meds>  No resp meds- on Coumadin, ToprolXL, Lisinopril, Eplerenone, Pravachol, Flomax, Neurontin    Immuniz status>  Epic records  indicate that he needs 2017 Flu vaccine & Pneumovax-23 shot...  EXAM shows Afeb, VSS, O2sat=95% on RA;  HEENT- neg, mallampati2;  Chest- clear x few basilar rhonchi, no consolidation;  Heart- Irreg AFib w/o m/r/g;  Abd- soft, nontender, neg;  Ext- neg w/o c/c/e;  Neuro- intact...   CXR 10/01/16> HE DID NOT GO TO XRAY FOR THE REQUESTED FILM..Marland KitchenMarland Kitchen Ambulatory Oximetry 10/01/16> O2sat=97% on RA at rest w/ pulse=68/min;  He walked 3 laps in the office (185'each) w/ lowest O2sat=94% w/ pulse=98/min...  LABS 10/01/16> HE DID NOT GO TO THE LAB FOR THE REQUESTED BLOOD WORK...  Full PFTs > scheduled and pending IMP  >>        DOE> likely multifactorial w/ components from lung/ heart/ deconditioning...      AbnSpirometry> it appears more restricted than obstructed, need to correlate to current imaging studies...      Ex-smoker> he quit in 1991 at age [age]w/ ~~30pack-yr smoking hx...      Cardiac issues> followed by DrHSmith-- HBP, CAD- s/p stent, AFib on coumadin...      Medical issues> followed by DrFisher-- HBP, HL, kidney stones/ BPH, DJD/ LBP PLAN >>   10/01/16:   We need some further assessment on Larry Bautista's lungs w/ a current CXR, review blood work, and Full PFTs;  He does not desat w/ exercise;  The most important intervention at this point is to increase his exercise  program- eg [facility] the [facility], silver sneakers, or similar group;  We are holding off on additional meds until we can see this data...    ~  November 06, 2016:  2mo ROV w/ SN>  Larry Bautista reports that he is about the same, but he is c/o more cough today, worse supine he thinks, does a lot of throat clearing;  He notes occas wheezing & reports that "allergies are really bad this yr" with runny nose & sneezing but "cough is from my meds";  He notes that he's been on the Lisinopril for >25yrs now & we wll check IgE & RASTs...    EXAM shows Afeb, VSS, O2sat=97% on RA;  HEENT- neg, mallampati2;  Chest- clear x min basilar rhonchi, no consolidation;  Heart- Irreg AFib  w/o m/r/g;  Abd- soft, nontender, neg;  Ext- neg w/o c/c/e;  Neuro- intact...   CXR 11/06/16>  Norm heart size, atherosclerotic Ao, sl pleural thickening bilat, min blunting left costophrenic angle...  Full PFTs 11/06/16>  FVC=2.45 (64%), FEV1=1.92 (70%), %1sec=78%; mid-flows are wnl at 85%; post bronchodil there was a sl response (FEV1 incr to 2.12 (77%) for a 10% improvement;  TLC=5.26 (79%), RV=2.63 (105%), RV/TLC=50&;  DLCO=75% predicted  This is c/w mild restriction, no real obstruction BUT a 10% improvement in FEV1 after the bronchodil), air trapping, & mild decr in DLCO noted...  LABS 11/06/16>  Chems- wnl w/ BS=98, Cr=0.80, LFTs wnl;  CBC- wnl w/ Hg=15.3;  TSH=0.47;  Sed=2;  IgE=13;  RAST- NEG x +grasses are borderline IMP >>         DOE> likely multifactorial w/ components from lung/ heart/ deconditioning...      AbnPFTs> it appears more restricted than obstructed, need to correlate to current imaging studies...      Ex-smoker> he quit in 1991 at age 64 w/ ~64 pack-yr smoking hx...      Cardiac issues> followed by DrHSmith-- HBP, CAD- s/p stent, AFib on coumadin...      Medical issues> followed by DrFisher-- HBP, HL, kidney stones/ BPH, DJD/ LBP PLAN >>  10/01/16:   We need some further assessment on Larry Bautista's lungs w/ a current CXR, review blood work, and Full PFTs;  He does not desat w/ exercise;  The most important intervention at this point is to increase his exercise program- eg at the Y, silver sneakers, or similar group;  We are holding off on additional meds until we can see this data...  11/06/16:   Larry Bautista has persistent DOE and a mild cough- again these are most likely multifactorial in etiology w/ components from pulm/ cardiac/ deconditioning as noted; he is also on an ACE & although he quit smoking in 1991, he has been way too sedentary w/o exercise program; Labs are all WNL x borderline RAST pos to grasses... REC to STOP Lisinopril, monitor BP at home to see if additional med needed or not,  deep breathing exercises... we plan rov recheck in 79mo.    Past Medical History:  Diagnosis Date  . Allergy   . CHF (congestive heart failure) ([place])   . Chronic knee pain   . Coronary artery disease    with LAD DE stent 2004  . Hyperlipidemia   . Hypertension   . Kidney stones   . Lower back pain    status post surgery for spondylolisthesis  . Migraine   . Persistent atrial fibrillation ([place])    longstanding persistent (since 05/2008)    Past Surgical History:  Procedure Laterality  Date  . BACK SURGERY     spondylolisthesis  . CARDIAC CATHETERIZATION  03/06/2003  . CORONARY ANGIOPLASTY WITH STENT PLACEMENT  04/2003   CYPHER stent implantation in the LAD  . HAND SURGERY Right 2014   carpal tunnel  . SKIN CANCER EXCISION  1980's   Face  . VASCULAR SURGERY     Vascular Stent    Outpatient Encounter Prescriptions as of 11/06/2016  Medication Sig  . aspirin 81 MG tablet Take 81 mg by mouth daily.  . diphenhydramine-acetaminophen (TYLENOL PM) 25-500 MG TABS Take 1 tablet by mouth at bedtime as needed.  [name] eplerenone (INSPRA) 25 MG tablet Take 1 tablet (25 mg total) by mouth daily.  . fish oil-omega-3 fatty acids 1000 MG capsule Take 2 capsules by mouth daily.   . fluticasone (FLONASE) 50 MCG/ACT nasal spray USE 2 SPRAYS IN EACH NOSTRIL EVERY DAY  . gabapentin (NEURONTIN) 300 MG capsule Take 300 mg by mouth 2 (two) times daily.   . meclizine (ANTIVERT) 25 MG tablet TAKE 1 TABLET 3 TIMES A DAY AS NEEDED VERTIGO  . metoprolol succinate (TOPROL-XL) 25 MG 24 hr tablet TAKE ONE (1) TABLET EACH DAY  . multivitamin (THERAGRAN) per tablet Take 1 tablet by mouth daily.  . NEOMYCIN-POLYMYXIN-HYDROCORTISONE (CORTISPORIN) 1 % SOLN otic solution Place 2 drops into both ears daily as needed.  . nitroGLYCERIN (NITROSTAT) 0.4 MG SL tablet Place 0.4 mg under the tongue every 5 (five) minutes as needed.  . pravastatin (PRAVACHOL) 80 MG tablet TAKE ONE (1) TABLET EACH DAY  . psyllium (METAMUCIL)  58.6 % powder Take 1 packet by mouth daily.   . tamsulosin (FLOMAX) 0.4 MG CAPS capsule Take 0.4 mg by mouth daily after supper.  . vitamin C (ASCORBIC ACID) 500 MG tablet Take 500 mg by mouth daily.  [name] warfarin (COUMADIN) 5 MG tablet TAKE AS DIRECTED  . [DISCONTINUED] lisinopril (PRINIVIL,ZESTRIL) 20 MG tablet TAKE ONE (1) TABLET EACH DAY (Patient not taking: Reported on 11/06/2016)   No facility-administered encounter medications on file as of 11/06/2016.     Allergies  Allergen Reactions  . Simvastatin Other (See Comments)    Leg pain  . Celecoxib Rash    Immunization History  Administered Date(s) Administered  . Influenza, High Dose Seasonal PF 05/01/2016  . Influenza-Unspecified 06/04/2015  . Pneumococcal Conjugate-13 03/28/2015  . Tdap 05/17/2012  . Zoster 11/04/2005    Family History  Problem Relation Age of Onset  . Heart attack Father   . Colon cancer Father   . Congestive Heart Failure Father   . Atrial fibrillation Brother   . Stroke Brother   . Lung cancer Mother   . Atrial fibrillation Brother   . COPD Brother   . Congestive Heart Failure Brother   . Lung cancer Brother     Social History   Social History  . Marital status: Married    Spouse name: Yigit Delgrande  . Number of children: 1  . Years of education: N/A   Occupational History  .      works part-time Optician, dispensing   Social History Main Topics  . Smoking status: Former Smoker    Types: Cigarettes    Quit date: 11/06/1996  . Smokeless tobacco: Never Used  . Alcohol use No  . Drug use: No  . Sexual activity: Not on file   Other Topics Concern  . Not on file   Social History Narrative  . No narrative on file    Review  of Systems             All symptoms NEG except where BOLDED >>  Constitutional:  F/C/S, fatigue, anorexia, unexpected weight change. HEENT:  HA, visual changes, hearing loss, earache, nasal symptoms, sore throat, mouth sores, hoarseness. Resp:  cough, sputum,  hemoptysis; SOB, tightness, wheezing. Cardio:  CP, palpit, DOE, orthopnea, edema. GI:  N/V/D/C, blood in stool; reflux, abd pain, distention, gas. GU:  dysuria, freq, urgency, hematuria, flank pain, voiding difficulty. MS:  joint pain, swelling, tenderness, decr ROM; neck pain, back pain, etc. Neuro:  HA, tremors, seizures, dizziness, syncope, weakness, numbness, gait abn. Skin:  suspicious lesions or skin rash. Heme:  adenopathy, bruising, bleeding. Psyche:  confusion, agitation, sleep disturbance, hallucinations, anxiety, depression suicidal.   Objective:   Physical Exam         Vital Signs:  Reviewed...   General:  WD, WN, 76 y/o WM in NAD; alert & oriented; pleasant & cooperative... HEENT:  [place]/AT; Conjunctiva- pink, Sclera- nonicteric, EOM-wnl, PERRLA, EACs-clear, TMs-wnl; NOSE-clear; THROAT-clear & wnl.  Neck:  Supple w/ fair ROM; no JVD; normal carotid impulses w/o bruits; no thyromegaly or nodules palpated; no lymphadenopathy.  Chest:  Clear to P & A; without wheezes, rales, or rhonchi heard. Heart:  Irregular rhythm (AFIB); norm S1 & S2 without murmurs, rubs, or gallops detected. Abdomen:  Soft & nontender- no guarding or rebound; normal bowel sounds; no organomegaly or masses palpated. Ext:  Decr ROM; without deformities +arthritic changes; no varicose veins, +venous insuffic, tr edema;  Pulses intact w/o bruits. Neuro:  CNs II-XII intact; motor testing normal; sensory testing normal; gait normal & balance OK. Derm:  No lesions noted; no rash etc. Lymph:  No cervical, supraclavicular, axillary, or inguinal adenopathy palpated.   Assessment:      IMP>>        DOE> likely multifactorial w/ components from lung/ heart/ deconditioning...      AbnSpirometry> it appears more restricted than obstructed, need to correlate to current imaging studies...      Ex-smoker> he quit in 1991 at age 27 w/ ~28 pack-yr smoking hx...      Cardiac issues> followed by DrHSmith-- HBP, CAD- s/p  stent, AFib on coumadin...      Medical issues> followed by DrFisher-- HBP, HL, kidney stones/ BPH, DJD/ LBP  PLAN>>       We need some further assessment on Larry Bautista's lungs w/ a current CXR, review blood work, and Full PFTs;  He does not desat w/ exercise;  The most important intervention at this point is to increase his exercise program- eg at the [facility], silver sneakers, or similar group;  We are holding off on additional meds until we can see this data...  11/06/16>   Larry Bautista has persistent DOE and a mild cough- again these are most likely multifactorial in etiology w/ components from pulm/ cardiac/ deconditioning as noted; he is also on an ACE & although he quit smoking in 1991, he has been way too sedentary w/o exercise program; Labs are all WNL x borderline RAST pos to grasses... REC to STOP Lisinopril, monitor BP at home to see if additional med needed or not, deep breathing exercises... we plan rov recheck in 62mo.     Plan:     Patient's Medications  New Prescriptions   No medications on file  Previous Medications   ASPIRIN 81 MG TABLET    Take 81 mg by mouth daily.   DIPHENHYDRAMINE-ACETAMINOPHEN (TYLENOL PM) 25-500 MG TABS  Take 1 tablet by mouth at bedtime as needed.   EPLERENONE (INSPRA) 25 MG TABLET    Take 1 tablet (25 mg total) by mouth daily.   FISH OIL-OMEGA-3 FATTY ACIDS 1000 MG CAPSULE    Take 2 capsules by mouth daily.    FLUTICASONE (FLONASE) 50 MCG/ACT NASAL SPRAY    USE 2 SPRAYS IN EACH NOSTRIL EVERY DAY   GABAPENTIN (NEURONTIN) 300 MG CAPSULE    Take 300 mg by mouth 2 (two) times daily.    MECLIZINE (ANTIVERT) 25 MG TABLET    TAKE 1 TABLET 3 TIMES A DAY AS NEEDED VERTIGO   METOPROLOL SUCCINATE (TOPROL-XL) 25 MG 24 HR TABLET    TAKE ONE (1) TABLET EACH DAY   MULTIVITAMIN (THERAGRAN) PER TABLET    Take 1 tablet by mouth daily.   NEOMYCIN-POLYMYXIN-HYDROCORTISONE (CORTISPORIN) 1 % SOLN OTIC SOLUTION    Place 2 drops into both ears daily as needed.   NITROGLYCERIN (NITROSTAT) 0.4  MG SL TABLET    Place 0.4 mg under the tongue every 5 (five) minutes as needed.   PRAVASTATIN (PRAVACHOL) 80 MG TABLET    TAKE ONE (1) TABLET EACH DAY   PSYLLIUM (METAMUCIL) 58.6 % POWDER    Take 1 packet by mouth daily.    TAMSULOSIN (FLOMAX) 0.4 MG CAPS CAPSULE    Take 0.4 mg by mouth daily after supper.   VITAMIN C (ASCORBIC ACID) 500 MG TABLET    Take 500 mg by mouth daily.   WARFARIN (COUMADIN) 5 MG TABLET    TAKE AS DIRECTED  Modified Medications   No medications on file  Discontinued Medications   LISINOPRIL (PRINIVIL,ZESTRIL) 20 MG TABLET    TAKE ONE (1) TABLET EACH DAY

## 2016-11-07 LAB — RESPIRATORY ALLERGY PROFILE REGION II ~~LOC~~
Allergen, A. alternata, m6: <0.1 kU/L
Allergen, Cedar tree, t12: <0.1 kU/L
Allergen, Comm Silver Birch, t9: <0.1 kU/L
Allergen, Mouse Urine Protein, e78: <0.1 kU/L
Allergen, Oak,t7: <0.1 kU/L
Allergen, P. notatum, m1: <0.1 kU/L
Aspergillus fumigatus, m3: <0.1 kU/L
Bermuda Grass: 0.18 kU/L — ABNORMAL HIGH
Box Elder IgE: <0.1 kU/L
Common Ragweed: <0.1 kU/L
IgE (Immunoglobulin E), Serum: 13 kU/L (ref <115)
JOHNSON GRASS: 0.23 kU/L — AB
Pecan/Hickory Tree IgE: <0.1 kU/L
Rough Pigweed  IgE: <0.1 kU/L
Timothy Grass: 2.34 kU/L — ABNORMAL HIGH

## 2016-11-11 ENCOUNTER — Telehealth: Payer: Self-pay | Admitting: Pulmonary Disease

## 2016-11-11 DIAGNOSIS — J301 Allergic rhinitis due to pollen: Secondary | ICD-10-CM

## 2016-11-11 MED ORDER — PREDNISONE 5 MG (21) PO TBPK: ORAL_TABLET | ORAL | 0 refills | Status: DC

## 2016-11-11 NOTE — Telephone Encounter (Signed)
Larry Space, MD sent to Elie Confer, CMA        Please notify patient>   Chems- all wnl; CBC- all wnl & neg eos; Thyroid- wnl; Sed rate= wnl at 2 (no inflamm)...  IgE level = 13 (norm <115) and RAST Tests are NEG except +GRASSES, but NEG for dog/cat, ragweed, trees, molds, etc...  REC> Call in PRED Dosepak (5mg -6d pack) & refer to [place] et alto consider immunotherapy shots...  Remember to leave off the Lisinopril/ ACE inhib due to cough & monitor BP going forward   Spoke with pt and notified of results per Dr. Lenna Gilford. Pt verbalized understanding and denied any questions.  Rx was sent and referral to [facility] made.

## 2016-11-13 ENCOUNTER — Other Ambulatory Visit: Payer: Medicare Other | Admitting: *Deleted

## 2016-11-13 ENCOUNTER — Ambulatory Visit (INDEPENDENT_AMBULATORY_CARE_PROVIDER_SITE_OTHER): Payer: Medicare Other | Admitting: *Deleted

## 2016-11-13 DIAGNOSIS — I2510 Atherosclerotic heart disease of native coronary artery without angina pectoris: Secondary | ICD-10-CM | POA: Diagnosis not present

## 2016-11-13 DIAGNOSIS — E7800 Pure hypercholesterolemia, unspecified: Secondary | ICD-10-CM

## 2016-11-13 DIAGNOSIS — Z5181 Encounter for therapeutic drug level monitoring: Secondary | ICD-10-CM

## 2016-11-13 DIAGNOSIS — I4891 Unspecified atrial fibrillation: Secondary | ICD-10-CM | POA: Diagnosis not present

## 2016-11-13 DIAGNOSIS — I10 Essential (primary) hypertension: Secondary | ICD-10-CM

## 2016-11-13 LAB — CBC WITH DIFFERENTIAL/PLATELET
Basophils Absolute: 0 10*3/uL (ref 0.0–0.2)
Basos: 0 %
EOS (ABSOLUTE): 0.1 10*3/uL (ref 0.0–0.4)
Eos: 1 %
HEMATOCRIT: 45.2 % (ref 37.5–51.0)
HEMOGLOBIN: 15.3 g/dL (ref 13.0–17.7)
Immature Grans (Abs): 0 10*3/uL (ref 0.0–0.1)
Immature Granulocytes: 0 %
LYMPHS ABS: 1.4 10*3/uL (ref 0.7–3.1)
Lymphs: 15 %
MCH: 31.4 pg (ref 26.6–33.0)
MCHC: 33.8 g/dL (ref 31.5–35.7)
MCV: 93 fL (ref 79–97)
MONOCYTES: 7 %
Monocytes Absolute: 0.7 10*3/uL (ref 0.1–0.9)
NEUTROS ABS: 7.2 10*3/uL — AB (ref 1.4–7.0)
Neutrophils: 77 %
Platelets: 260 10*3/uL (ref 150–379)
RBC: 4.88 x10E6/uL (ref 4.14–5.80)
RDW: 12.8 % (ref 12.3–15.4)
WBC: 9.4 10*3/uL (ref 3.4–10.8)

## 2016-11-13 LAB — POCT INR: INR: 2.3

## 2016-11-13 LAB — BASIC METABOLIC PANEL
BUN / CREAT RATIO: 17 (ref 10–24)
BUN: 13 mg/dL (ref 8–27)
CO2: 23 mmol/L (ref 18–29)
CREATININE: 0.78 mg/dL (ref 0.76–1.27)
Calcium: 9.5 mg/dL (ref 8.6–10.2)
Chloride: 105 mmol/L (ref 96–106)
GFR calc Af Amer: 101 mL/min/{1.73_m2} (ref >59)
GFR, EST NON AFRICAN AMERICAN: 87 mL/min/{1.73_m2} (ref >59)
Glucose: 106 mg/dL — ABNORMAL HIGH (ref 65–99)
Potassium: 4.1 mmol/L (ref 3.5–5.2)
SODIUM: 145 mmol/L — AB (ref 134–144)

## 2016-11-13 NOTE — Addendum Note (Signed)
Addended by: Eulis Foster on: 11/13/2016 10:55 AM   Modules accepted: Orders

## 2016-11-20 ENCOUNTER — Other Ambulatory Visit: Payer: Self-pay | Admitting: Interventional Cardiology

## 2016-11-20 DIAGNOSIS — J309 Allergic rhinitis, unspecified: Secondary | ICD-10-CM

## 2016-11-26 ENCOUNTER — Other Ambulatory Visit: Payer: Self-pay | Admitting: Interventional Cardiology

## 2016-11-28 DIAGNOSIS — M4726 Other spondylosis with radiculopathy, lumbar region: Secondary | ICD-10-CM | POA: Diagnosis not present

## 2016-11-28 DIAGNOSIS — Z981 Arthrodesis status: Secondary | ICD-10-CM | POA: Diagnosis not present

## 2016-11-28 DIAGNOSIS — M5136 Other intervertebral disc degeneration, lumbar region: Secondary | ICD-10-CM | POA: Diagnosis not present

## 2016-11-28 DIAGNOSIS — G039 Meningitis, unspecified: Secondary | ICD-10-CM | POA: Diagnosis not present

## 2016-12-10 ENCOUNTER — Other Ambulatory Visit: Payer: Self-pay | Admitting: Family Medicine

## 2016-12-10 NOTE — Telephone Encounter (Signed)
Patient wants Flonase called in please.  The [address] [place], [place]

## 2016-12-11 ENCOUNTER — Ambulatory Visit (INDEPENDENT_AMBULATORY_CARE_PROVIDER_SITE_OTHER): Payer: Medicare Other | Admitting: *Deleted

## 2016-12-11 DIAGNOSIS — Z5181 Encounter for therapeutic drug level monitoring: Secondary | ICD-10-CM

## 2016-12-11 DIAGNOSIS — I2510 Atherosclerotic heart disease of native coronary artery without angina pectoris: Secondary | ICD-10-CM

## 2016-12-11 DIAGNOSIS — I4891 Unspecified atrial fibrillation: Secondary | ICD-10-CM | POA: Diagnosis not present

## 2016-12-11 LAB — POCT INR: INR: 2.6

## 2016-12-12 DIAGNOSIS — J301 Allergic rhinitis due to pollen: Secondary | ICD-10-CM | POA: Diagnosis not present

## 2016-12-12 DIAGNOSIS — R05 Cough: Secondary | ICD-10-CM | POA: Diagnosis not present

## 2016-12-12 DIAGNOSIS — J309 Allergic rhinitis, unspecified: Secondary | ICD-10-CM | POA: Diagnosis not present

## 2016-12-14 NOTE — Telephone Encounter (Signed)
Please send refill request with correct pharmacy. Thanks.

## 2016-12-15 MED ORDER — FLUTICASONE PROPIONATE 50 MCG/ACT NA SUSP: 2.0000 | Freq: Every day | NASAL | 3 refills | Status: DC

## 2016-12-15 NOTE — Telephone Encounter (Signed)
Rx sent 

## 2016-12-24 ENCOUNTER — Other Ambulatory Visit: Payer: Self-pay | Admitting: Interventional Cardiology

## 2016-12-29 ENCOUNTER — Other Ambulatory Visit: Payer: Self-pay | Admitting: Interventional Cardiology

## 2017-01-22 ENCOUNTER — Ambulatory Visit (INDEPENDENT_AMBULATORY_CARE_PROVIDER_SITE_OTHER): Payer: Medicare Other | Admitting: *Deleted

## 2017-01-22 DIAGNOSIS — Z5181 Encounter for therapeutic drug level monitoring: Secondary | ICD-10-CM

## 2017-01-22 DIAGNOSIS — I2510 Atherosclerotic heart disease of native coronary artery without angina pectoris: Secondary | ICD-10-CM

## 2017-01-22 DIAGNOSIS — I4891 Unspecified atrial fibrillation: Secondary | ICD-10-CM | POA: Diagnosis not present

## 2017-01-22 LAB — POCT INR: INR: 2.1

## 2017-01-30 DIAGNOSIS — M25511 Pain in right shoulder: Secondary | ICD-10-CM | POA: Diagnosis not present

## 2017-02-09 ENCOUNTER — Encounter: Payer: Self-pay | Admitting: Pulmonary Disease

## 2017-02-09 ENCOUNTER — Ambulatory Visit: Payer: Medicare Other | Admitting: Physical Therapy

## 2017-02-09 ENCOUNTER — Ambulatory Visit (INDEPENDENT_AMBULATORY_CARE_PROVIDER_SITE_OTHER): Payer: Medicare Other | Admitting: Pulmonary Disease

## 2017-02-09 VITALS — BP 114/66 | HR 66 | Ht 68.0 in | Wt 182.2 lb

## 2017-02-09 DIAGNOSIS — N401 Enlarged prostate with lower urinary tract symptoms: Secondary | ICD-10-CM | POA: Diagnosis not present

## 2017-02-09 DIAGNOSIS — R942 Abnormal results of pulmonary function studies: Secondary | ICD-10-CM | POA: Diagnosis not present

## 2017-02-09 DIAGNOSIS — I480 Paroxysmal atrial fibrillation: Secondary | ICD-10-CM

## 2017-02-09 DIAGNOSIS — N138 Other obstructive and reflux uropathy: Secondary | ICD-10-CM | POA: Diagnosis not present

## 2017-02-09 DIAGNOSIS — R0609 Other forms of dyspnea: Secondary | ICD-10-CM | POA: Diagnosis not present

## 2017-02-09 DIAGNOSIS — E782 Mixed hyperlipidemia: Secondary | ICD-10-CM | POA: Diagnosis not present

## 2017-02-09 DIAGNOSIS — I2510 Atherosclerotic heart disease of native coronary artery without angina pectoris: Secondary | ICD-10-CM

## 2017-02-09 DIAGNOSIS — Z87891 Personal history of nicotine dependence: Secondary | ICD-10-CM | POA: Diagnosis not present

## 2017-02-09 DIAGNOSIS — M4807 Spinal stenosis, lumbosacral region: Secondary | ICD-10-CM

## 2017-02-09 DIAGNOSIS — I10 Essential (primary) hypertension: Secondary | ICD-10-CM | POA: Diagnosis not present

## 2017-02-09 NOTE — Patient Instructions (Signed)
Today we updated your med list in our EPIC system...     We decided to once again try to STOP THE LISINOPRIL BP med...    I think you will do fine without it & the cough etc should resolve...  Monitor your BP at home and call for any problems...    We have several other options for meds should we need to add something else...  Keep up the good work w/ diet (no salt) & exercise!!!  Let's plan a follow up visit in 35mo, sooner if needed for problems.Marland KitchenMarland Kitchen

## 2017-02-09 NOTE — Progress Notes (Signed)
Subjective:     Patient ID: Larry Bautista, male   DOB: 12/04/1940, 76 y.o.   MRN: [id number]  HPI 76 y/o WM referred by Dr. Juanetta Bautista, [facility], for a pulmonary evaluation due to intermittent wheezing and an abnormal spirometry test...   ~  I reviewed old EPIC records for pertinent DATA>> ~  CXR 04/05/14 showed norm heart size, tortuous Ao, clear lungs- NAD, DDD in [facility]spine...  ~  Spirometry 08/06/16 in Larry Bautista's office> FVC=2.20 (57%), FEV1=1.62 (59%), %1sec=74%, mid-flows reduced at 59% predicted; This is c/w a mod restrictive ventilatory defect w/ superimposed small airways dis.  ~  October 01, 2016:  Initial pulmonary consultation by SN>      Larry Bautista reports that he has been SOB & wheezing for ~27yr the symptoms are intermittent but seem worse supine per pt; his PCP has treated him for bronchitis w/ antibiotics and Pred w/ improvement but the symptoms return... He notes SOB/ DOE but states it's due to "lack of exercise"- ADLs are OK, stairs are a problem, carrying a load, etc; he says stable & no real change over the last yr... He's also noted a mild dry nagging cough & he is on an ACE for HBP and hx CAD...  Smoking Hx>  he is an ex-smoker, started ~15, smoked regularly 1ppd, transiently up to 2ppd, Quit 1991 at age [age]because "I didn't like being addicted"; total ~35 yrs smoking & ~40 pack-yr history...  Pulmonary Hx>  He denies hx lung problems until the last yr or so; denies hx asthma, pneumonia, TB or exposure, etc...  Medical Hx>  HBP, CAD w/ stent, AFib (followed by Larry Bautista), HL, kidney stones/BPH, DJD/ LBP,   Family Hx>  Lung cancer in mother, heart dis & colon cancer in father, lung cancer & COPD in brother...  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  Current Meds>  No resp meds- on Coumadin, ToprolXL, Lisinopril, Eplerenone, Pravachol, Flomax, Neurontin    Immuniz status>  Epic records  indicate that he needs 2017 Flu vaccine & Pneumovax-23 shot...  EXAM shows Afeb, VSS, O2sat=95% on RA;  HEENT- neg, mallampati2;  Chest- clear x few basilar rhonchi, no consolidation;  Heart- Irreg AFib w/o m/r/g;  Abd- soft, nontender, neg;  Ext- neg w/o c/c/e;  Neuro- intact...   CXR 10/01/16> HE DID NOT GO TO XRAY FOR THE REQUESTED FILM..Marland KitchenMarland Kitchen Ambulatory Oximetry 10/01/16> O2sat=97% on RA at rest w/ pulse=68/min;  He walked 3 laps in the office (185'each) w/ lowest O2sat=94% w/ pulse=98/min...  LABS 10/01/16> HE DID NOT GO TO THE LAB FOR THE REQUESTED BLOOD WORK...  Full PFTs > scheduled and pending IMP  >>        DOE> likely multifactorial w/ components from lung/ heart/ deconditioning...      AbnSpirometry> it appears more restricted than obstructed, need to correlate to current imaging studies...      Ex-smoker> he quit in 1991 at age [age]w/ ~~30pack-yr smoking hx...      Cardiac issues> followed by Larry Bautista-- HBP, CAD- s/p stent, AFib on coumadin...      Medical issues> followed by Larry Bautista-- HBP, HL, kidney stones/ BPH, DJD/ LBP PLAN >>   10/01/16:   We need some further assessment on Larry Bautista's lungs w/ a current CXR, review blood work, and Full PFTs;  He does not desat w/ exercise;  The most important intervention at this point is to increase his exercise  program- [facility] the [facility], silver sneakers, or similar group;  We are holding off on additional meds until we can see this data...   ~  November 06, 2016:  82mo ROV w/ SN>  Larry Bautista reports that he is about the same, but he is c/o more cough today, worse supine he thinks, does a lot of throat clearing;  He notes occas wheezing & reports that "allergies are really bad this yr" with runny nose & sneezing but "cough is from my meds";  He notes that he's been on the Lisinopril for >51yrs now & we wll check IgE & RASTs...    EXAM shows Afeb, VSS, O2sat=97% on RA;  HEENT- neg, mallampati2;  Chest- clear x min basilar rhonchi, no consolidation;  Heart- Irreg AFib w/o  m/r/g;  Abd- soft, nontender, neg;  Ext- neg w/o c/c/e;  Neuro- intact...   CXR 11/06/16>  Norm heart size, atherosclerotic Ao, sl pleural thickening bilat, min blunting left costophrenic angle...  Full PFTs 11/06/16>  FVC=2.45 (64%), FEV1=1.92 (70%), %1sec=78%; mid-flows are wnl at 85%; post bronchodil there was a sl response (FEV1 incr to 2.12 (77%) for a 10% improvement;  TLC=5.26 (79%), RV=2.63 (105%), RV/TLC=50&;  DLCO=75% predicted  This is c/w mild restriction, no real obstruction BUT a 10% improvement in FEV1 after the bronchodil), air trapping, & mild decr in DLCO noted...  LABS 11/06/16>  Chems- wnl w/ BS=98, Cr=0.80, LFTs wnl;  CBC- wnl w/ Hg=15.3;  TSH=0.47;  Sed=2;  IgE=13;  RAST- NEG x +grasses are borderline IMP >>         DOE> likely multifactorial w/ components from lung/ heart/ deconditioning...      AbnPFTs> it appears more restricted than obstructed, need to correlate to current imaging studies...      Ex-smoker> he quit in 1991 at age 73 w/ ~73 pack-yr smoking hx...      Cardiac issues> followed by Larry Bautista-- HBP, CAD- s/p stent, AFib on coumadin...      Medical issues> followed by Larry Bautista-- HBP, HL, kidney stones/ BPH, DJD/ LBP PLAN >>  10/01/16:   We need some further assessment on Larry Bautista's lungs w/ a current CXR, review blood work, and Full PFTs;  He does not desat w/ exercise;  The most important intervention at this point is to increase his exercise program- eg at the Y, silver sneakers, or similar group;  We are holding off on additional meds until we can see this data...  11/06/16:   Larry Bautista has persistent DOE and a mild cough- again these are most likely multifactorial in etiology w/ components from pulm/ cardiac/ deconditioning as noted; he is also on an ACE & although he quit smoking in 1991, he has been way too sedentary w/o exercise program; Labs are all WNL x borderline RAST pos to grasses... REC to STOP Lisinopril, monitor BP at home to see if additional med needed or not, deep  breathing exercises... we plan rov recheck in 3mo.   ~  February 09, 2017:  35mo ROV & pulmonary follow up visit>  Larry Bautista never stopped the Lisinopril as directed after the last visit and now he indicates that he has a low BP ~80 in the AM, but improved to 110-120 range throughout the day;  He also notes sl chronic cough & throat clearing which could also be related to the ACE inhibitor;  He does note however that his SOB/DOE is sl better since he has started an exercise program- walking/ stairs/ etc..[name]  He saw DrSharma for Aleergy 12/12/16>  Chr rhinorrhea, clear mucus, skin testing was pos for grass & weeds, food testing NEG;  They rec Zyrtek, Flonase, Atrovent nasal...    DOE is sl improved w/ starting an exercise program...    HBP on ToprolXL25, Lisin20 (he never stopped as prev instructed), Eplerenone25; BP= 114/66 & we discussed stopping the ACE inhib rx again, monitor BP at home & call for any questions...    CAD, Afib on Coumadin, ASA81 and followed by PCP-Dr.Don Fisher & CARDS- Dr.HSmith EXAM shows Afeb, VSS, O2sat=97% on RA;  HEENT- neg, mallampati2;  Chest- clear x min basilar rhonchi, no consolidation;  Heart- Irreg AFib w/o m/r/g;  Abd- soft, nontender, neg;  Ext- neg w/o c/c/e;  Neuro- intact... IMP/PLAN>>  Larry Bautista is reminded to stop the Lisinopril completely & monitor his BP & symptoms going forward; hopefully his resp symptoms will resolve off the ACE + continued allergy rx per DrSharma;  He needs to maintain & push his exercise program...    Past Medical History:  Diagnosis Date  . Allergy   . CHF (congestive heart failure) ([place])   . Chronic knee pain   . Coronary artery disease    with LAD DE stent 2004  . Hyperlipidemia   . Hypertension   . Kidney stones   . Lower back pain    status post surgery for spondylolisthesis  . Migraine   . Persistent atrial fibrillation ([place])    longstanding persistent (since 05/2008)    Past Surgical History:  Procedure Laterality Date  . BACK  SURGERY     spondylolisthesis  . CARDIAC CATHETERIZATION  03/06/2003  . CORONARY ANGIOPLASTY WITH STENT PLACEMENT  04/2003   CYPHER stent implantation in the LAD  . HAND SURGERY Right 2014   carpal tunnel  . SKIN CANCER EXCISION  1980's   Face  . VASCULAR SURGERY     Vascular Stent    Outpatient Encounter Prescriptions as of 02/09/2017  Medication Sig  . aspirin 81 MG tablet Take 81 mg by mouth daily.  . cetirizine (ZYRTEC) 10 MG tablet Take 10 mg by mouth daily.  . diphenhydramine-acetaminophen (TYLENOL PM) 25-500 MG TABS Take 1 tablet by mouth at bedtime as needed.  [name] eplerenone (INSPRA) 25 MG tablet Take 1 tablet (25 mg total) by mouth daily.  . fish oil-omega-3 fatty acids 1000 MG capsule Take 2 capsules by mouth daily.   [name] gabapentin (NEURONTIN) 300 MG capsule Take 300 mg by mouth 2 (two) times daily.   . metoprolol succinate (TOPROL-XL) 25 MG 24 hr tablet Take 1 tablet (25 mg total) by mouth daily.  . multivitamin (THERAGRAN) per tablet Take 1 tablet by mouth daily.  . pravastatin (PRAVACHOL) 80 MG tablet TAKE ONE (1) TABLET EACH DAY  . psyllium (METAMUCIL) 58.6 % powder Take 1 packet by mouth daily.   . tamsulosin (FLOMAX) 0.4 MG CAPS capsule Take 0.4 mg by mouth daily after supper.  . vitamin C (ASCORBIC ACID) 500 MG tablet Take 500 mg by mouth daily.  [name] warfarin (COUMADIN) 5 MG tablet TAKE ONE TABLET DAILY AS DIRECTED  . fluticasone (FLONASE) 50 MCG/ACT nasal spray Place 2 sprays into both nostrils daily. (Patient not taking: Reported on 02/09/2017)  . ipratropium (ATROVENT) 0.06 % nasal spray Place 2 sprays into both nostrils 3 (three) times daily.  . nitroGLYCERIN (NITROSTAT) 0.4 MG SL tablet Place 0.4 mg under the tongue every 5 (five) minutes as needed.  . [DISCONTINUED] lisinopril (PRINIVIL,ZESTRIL) 20 MG  tablet Take 1 tablet (20 mg total) by mouth daily. (Patient not taking: Reported on 02/09/2017)  . [DISCONTINUED] meclizine (ANTIVERT) 25 MG tablet TAKE 1 TABLET 3 TIMES A  DAY AS NEEDED VERTIGO (Patient not taking: Reported on 02/09/2017)  . [DISCONTINUED] NEOMYCIN-POLYMYXIN-HYDROCORTISONE (CORTISPORIN) 1 % SOLN otic solution Place 2 drops into both ears daily as needed.  . [DISCONTINUED] predniSONE (STERAPRED UNI-PAK 21 TAB) 5 MG (21) TBPK tablet Take as directed (Patient not taking: Reported on 02/09/2017)   No facility-administered encounter medications on file as of 02/09/2017.     Allergies  Allergen Reactions  . Simvastatin Other (See Comments)    Leg pain  . Celecoxib Rash    Immunization History  Administered Date(s) Administered  . Influenza, High Dose Seasonal PF 05/01/2016  . Influenza-Unspecified 06/04/2015  . Pneumococcal Conjugate-13 03/28/2015  . Tdap 05/17/2012  . Zoster 11/04/2005    Current Medications, Allergies, Past Medical History, Past Surgical History, Family History, and Social History were reviewed in [employer] record.   Review of Systems             All symptoms NEG except where BOLDED >>  Constitutional:  F/C/S, fatigue, anorexia, unexpected weight change. HEENT:  HA, visual changes, hearing loss, earache, nasal symptoms, sore throat, mouth sores, hoarseness. Resp:  cough, sputum, hemoptysis; SOB, tightness, wheezing. Cardio:  CP, palpit, DOE, orthopnea, edema. GI:  N/V/D/C, blood in stool; reflux, abd pain, distention, gas. GU:  dysuria, freq, urgency, hematuria, flank pain, voiding difficulty. MS:  joint pain, swelling, tenderness, decr ROM; neck pain, back pain, etc. Neuro:  HA, tremors, seizures, dizziness, syncope, weakness, numbness, gait abn. Skin:  suspicious lesions or skin rash. Heme:  adenopathy, bruising, bleeding. Psyche:  confusion, agitation, sleep disturbance, hallucinations, anxiety, depression suicidal.   Objective:   Physical Exam         Vital Signs:  Reviewed...   General:  WD, WN, 76 y/o WM in NAD; alert & oriented; pleasant & cooperative... HEENT:  [place]/AT; Conjunctiva-  pink, Sclera- nonicteric, EOM-wnl, PERRLA, EACs-clear, TMs-wnl; NOSE-clear; THROAT-clear & wnl.  Neck:  Supple w/ fair ROM; no JVD; normal carotid impulses w/o bruits; no thyromegaly or nodules palpated; no lymphadenopathy.  Chest:  Clear to P & A; without wheezes, rales, or rhonchi heard. Heart:  Irregular rhythm (AFIB); norm S1 & S2 without murmurs, rubs, or gallops detected. Abdomen:  Soft & nontender- no guarding or rebound; normal bowel sounds; no organomegaly or masses palpated. Ext:  Decr ROM; without deformities +arthritic changes; no varicose veins, +venous insuffic, tr edema;  Pulses intact w/o bruits. Neuro:  CNs II-XII intact; motor testing normal; sensory testing normal; gait normal & balance OK. Derm:  No lesions noted; no rash etc. Lymph:  No cervical, supraclavicular, axillary, or inguinal adenopathy palpated.   Assessment:      IMP>>        DOE> likely multifactorial w/ components from lung/ heart/ deconditioning...      AbnSpirometry> it appears more restricted than obstructed, need to correlate to current imaging studies...      Ex-smoker> he quit in 1991 at age 51 w/ ~20 pack-yr smoking hx...      Cardiac issues> followed by Larry Bautista-- HBP, CAD- s/p stent, AFib on coumadin...      Medical issues> followed by Larry Bautista-- HBP, HL, kidney stones/ BPH, DJD/ LBP  PLAN>>       We need some further assessment on Larry Bautista's lungs w/ a current CXR, review blood work, and Full PFTs;  He does not desat w/ exercise;  The most important intervention at this point is to increase his exercise program- [facility], silver sneakers, or similar group;  We are holding off on additional meds until we can see this data...  11/06/16>   Larry Bautista has persistent DOE and a mild cough- again these are most likely multifactorial in etiology w/ components from pulm/ cardiac/ deconditioning as noted; he is also on an ACE & although he quit smoking in 1991, he has been way too sedentary w/o exercise program; Labs  are all WNL x borderline RAST pos to grasses... REC to STOP Lisinopril, monitor BP at home to see if additional med needed or not, deep breathing exercises... we plan rov recheck in 41mo. 02/09/17>   Larry Bautista is reminded to stop the Lisinopril completely & monitor his BP & symptoms going forward; hopefully his resp symptoms will resolve off the ACE + continued allergy rx per DrSharma;  He needs to maintain & push his exercise program.     Plan:     Patient's Medications  New Prescriptions   No medications on file  Previous Medications   ASPIRIN 81 MG TABLET    Take 81 mg by mouth daily.   CETIRIZINE (ZYRTEC) 10 MG TABLET    Take 10 mg by mouth daily.   DIPHENHYDRAMINE-ACETAMINOPHEN (TYLENOL PM) 25-500 MG TABS    Take 1 tablet by mouth at bedtime as needed.   EPLERENONE (INSPRA) 25 MG TABLET    Take 1 tablet (25 mg total) by mouth daily.   FISH OIL-OMEGA-3 FATTY ACIDS 1000 MG CAPSULE    Take 2 capsules by mouth daily.    FLUTICASONE (FLONASE) 50 MCG/ACT NASAL SPRAY    Place 2 sprays into both nostrils daily.   GABAPENTIN (NEURONTIN) 300 MG CAPSULE    Take 300 mg by mouth 2 (two) times daily.    IPRATROPIUM (ATROVENT) 0.06 % NASAL SPRAY    Place 2 sprays into both nostrils 3 (three) times daily.   METOPROLOL SUCCINATE (TOPROL-XL) 25 MG 24 HR TABLET    Take 1 tablet (25 mg total) by mouth daily.   MULTIVITAMIN (THERAGRAN) PER TABLET    Take 1 tablet by mouth daily.   NITROGLYCERIN (NITROSTAT) 0.4 MG SL TABLET    Place 0.4 mg under the tongue every 5 (five) minutes as needed.   PRAVASTATIN (PRAVACHOL) 80 MG TABLET    TAKE ONE (1) TABLET EACH DAY   PSYLLIUM (METAMUCIL) 58.6 % POWDER    Take 1 packet by mouth daily.    TAMSULOSIN (FLOMAX) 0.4 MG CAPS CAPSULE    Take 0.4 mg by mouth daily after supper.   VITAMIN C (ASCORBIC ACID) 500 MG TABLET    Take 500 mg by mouth daily.   WARFARIN (COUMADIN) 5 MG TABLET    TAKE ONE TABLET DAILY AS DIRECTED  Modified Medications   No medications on file   Discontinued Medications   LISINOPRIL (PRINIVIL,ZESTRIL) 20 MG TABLET    Take 1 tablet (20 mg total) by mouth daily.   MECLIZINE (ANTIVERT) 25 MG TABLET    TAKE 1 TABLET 3 TIMES A DAY AS NEEDED VERTIGO   NEOMYCIN-POLYMYXIN-HYDROCORTISONE (CORTISPORIN) 1 % SOLN OTIC SOLUTION    Place 2 drops into both ears daily as needed.   PREDNISONE (STERAPRED UNI-PAK 21 TAB) 5 MG (21) TBPK TABLET    Take as directed

## 2017-02-10 ENCOUNTER — Ambulatory Visit: Payer: Medicare Other | Attending: Sports Medicine | Admitting: Physical Therapy

## 2017-02-10 DIAGNOSIS — M25611 Stiffness of right shoulder, not elsewhere classified: Secondary | ICD-10-CM | POA: Diagnosis not present

## 2017-02-10 DIAGNOSIS — M6281 Muscle weakness (generalized): Secondary | ICD-10-CM

## 2017-02-10 DIAGNOSIS — M25511 Pain in right shoulder: Secondary | ICD-10-CM | POA: Diagnosis not present

## 2017-02-10 DIAGNOSIS — G8929 Other chronic pain: Secondary | ICD-10-CM | POA: Diagnosis not present

## 2017-02-10 NOTE — Therapy (Signed)
[facility] [place], [place], [address] Phone: [phone]   Fax:  [phone]  Physical Therapy Evaluation  Patient Details  Name: Larry Bautista MRN: [id number] Date of Birth: [birth year] Referring Provider: Wandra Feinstein MD  Encounter Date: 02/10/2017      PT End of Session - 02/10/17 1422    Visit Number 1   Number of Visits 16   Date for PT Re-Evaluation 04/11/17   PT Start Time 1033   PT Stop Time 1122   PT Time Calculation (min) 49 min   Activity Tolerance Patient tolerated treatment well   Behavior During Therapy [facility] for tasks assessed/performed      Past Medical History:  Diagnosis Date  . Allergy   . CHF (congestive heart failure) ([place])   . Chronic knee pain   . Coronary artery disease    with LAD DE stent 2004  . Hyperlipidemia   . Hypertension   . Kidney stones   . Lower back pain    status post surgery for spondylolisthesis  . Migraine   . Persistent atrial fibrillation ([place])    longstanding persistent (since 05/2008)    Past Surgical History:  Procedure Laterality Date  . BACK SURGERY     spondylolisthesis  . CARDIAC CATHETERIZATION  03/06/2003  . CORONARY ANGIOPLASTY WITH STENT PLACEMENT  04/2003   CYPHER stent implantation in the LAD  . HAND SURGERY Right 2014   carpal tunnel  . SKIN CANCER EXCISION  1980's   Face  . VASCULAR SURGERY     Vascular Stent    There were no vitals filed for this visit.       Subjective Assessment - 02/10/17 1436    Subjective The patient report right shoulder pain rated at 5/10 today and can rise to 6-3+/01 with certain movements.  The patient pain has been evolving over the last year.  He hopes to avoid surgery if possible.  Rest decreases his pain.            [facility] PT Assessment - 02/10/17 0001      Assessment   Medical Diagnosis Right shoulder GH OA.   Referring Provider Wandra Feinstein MD   Onset Date/Surgical Date --  One year+     Precautions   Precautions None     Restrictions   Weight Bearing Restrictions No     Balance Screen   Has the patient fallen in the past 6 months Yes   How many times? --  2.   Has the patient had a decrease in activity level because of a fear of falling?  No   Is the patient reluctant to leave their home because of a fear of falling?  No     Home Environment   Living Environment Private residence     Prior Function   Level of Independence Independent     Posture/Postural Control   Posture/Postural Control Postural limitations   Postural Limitations Rounded Shoulders;Forward head     ROM / Strength   AROM / PROM / Strength AROM;Strength     AROM   Overall AROM Comments Rigth shoulder flexion= 135 degrees; ER= 75 degrees; IR= 45 degrees and horiizontal adduction= 100 degrees.     Strength   Overall Strength Comments Right shoulder ER= 4-/5 limited at least in part due to pain; IR= 4 to 4+/5; deltoid strength= 4 to 4+/5.     Palpation   Palpation comment Pain generally "in" right shoulder joint and  posterior cuff region.     Special Tests    Special Tests --  (+) right shoulder Impigement test.     Ambulation/Gait   Gait Comments WNL.            Objective measurements completed on examination: See above findings.          [facility] Adult PT Treatment/Exercise - 02/10/17 0001      Modalities   Modalities Electrical Stimulation     Electrical Stimulation   Electrical Stimulation Location Right shoulder   Electrical Stimulation Action IFC   Electrical Stimulation Parameters 80-150 Hz x 20 minutes.   Electrical Stimulation Goals Pain                  PT Short Term Goals - 02/10/17 1427      PT SHORT TERM GOAL #1   Title STG's=LTG's.           PT Long Term Goals - 02/10/17 1435      PT LONG TERM GOAL #1   Title Independent with a HEP.   Time 8   Period Weeks   Status New     PT LONG TERM GOAL #2   Title Right shoulder ER/IR  strength= 5/5 to increase stability for fucntional tasks.   Time 8   Period Weeks   Status New     PT LONG TERM GOAL #3   Title Active right shoulder flexion to 145 degrees so the patient can easily reach overhead   Time 8   Period Weeks   Status New     PT LONG TERM GOAL #4   Title Perform ADL's with pain not > 3/10.   Time 8   Period Weeks   Status New                Plan - 02/10/17 1423    Clinical Impression Statement The patient presents to 34 wiht c/o right shoulder pain due to arthritis that has worsened over the last year or so.  He exhibits some losses of right shoulder ROM and strength and this has impaired his ability to perform ADL's like he wishes.  He has a positive left shoulder Impingement test on the right.  The patient will benefit from skilled physical therapy.   Clinical Presentation Evolving   Clinical Decision Making Low   Rehab Potential Good   PT Frequency 2x / week   PT Duration 8 weeks   PT Treatment/Interventions ADLs/Self Care Home Management;Cryotherapy;Electrical Stimulation;Moist Heat;Ultrasound;Therapeutic exercise;Therapeutic activities;Functional mobility training;Patient/family education;Manual techniques;Dry needling;Passive range of motion   PT Next Visit Plan Pain-free pulleys; UE Ranger; STW/M and DN to posterior cuff region; RW4; [facility] ER; scapular strengthening.  Modalites PRN.   Consulted and Agree with Plan of Care Patient      Patient will benefit from skilled therapeutic intervention in order to improve the following deficits and impairments:  Decreased activity tolerance, Decreased range of motion, Decreased strength, Pain  Visit Diagnosis: Chronic right shoulder pain - Plan: PT plan of care cert/re-cert  Stiffness of right shoulder, not elsewhere classified - Plan: PT plan of care cert/re-cert  Muscle weakness (generalized) - Plan: PT plan of care cert/re-cert      G-Codes - 70/35/00 1406    Functional Assessment Tool  Used (Outpatient Only) Clinical judgement..   Functional Limitation Self care   Self Care Current Status (X3818) At least 40 percent but less than 60 percent impaired, limited or restricted   Self Care Goal  Status [phone]) At least 20 percent but less than 40 percent impaired, limited or restricted       Problem List Patient Active Problem List   Diagnosis Date Noted  . Dyspnea 10/01/2016  . Abnormal PFTs 10/01/2016  . Ex-smoker 10/01/2016  . Hyperglycemia 03/28/2015  . Allergic rhinitis 01/24/2015  . Arthritis 01/24/2015  . Basal cell carcinoma 01/24/2015  . BPH (benign prostatic hyperplasia) 01/24/2015  . Body mass index 27.0-27.9, adult 01/24/2015  . H/O renal calculi 01/24/2015  . Headache, migraine 01/24/2015  . AF (paroxysmal atrial fibrillation) ([place]) 01/24/2015  . Spinal stenosis 01/24/2015  . SPL (spondylolisthesis) 01/24/2015  . Benign paroxysmal positional nystagmus 01/24/2015  . Encounter for therapeutic drug monitoring 11/02/2013  . Hyperlipidemia 05/03/2009  . MIGRAINE HEADACHE 05/03/2009  . Essential hypertension 05/03/2009  . CAD in native artery 05/03/2009  . KNEE PAIN 05/03/2009  . Backache 05/03/2009  . History of tobacco use 05/03/2009    Ishanvi Mcquitty, [place] MPT 02/10/2017, 2:42 PM  [facility] [address] [place], [place], [address] Phone: [phone]   Fax:  [phone]  Name: Larry Bautista MRN: [id number] Date of Birth: 1941/04/17

## 2017-02-12 ENCOUNTER — Encounter: Payer: Self-pay | Admitting: Physical Therapy

## 2017-02-12 ENCOUNTER — Ambulatory Visit: Payer: Medicare Other | Admitting: Physical Therapy

## 2017-02-12 DIAGNOSIS — G8929 Other chronic pain: Secondary | ICD-10-CM | POA: Diagnosis not present

## 2017-02-12 DIAGNOSIS — M6281 Muscle weakness (generalized): Secondary | ICD-10-CM

## 2017-02-12 DIAGNOSIS — M25511 Pain in right shoulder: Principal | ICD-10-CM

## 2017-02-12 DIAGNOSIS — M25611 Stiffness of right shoulder, not elsewhere classified: Secondary | ICD-10-CM

## 2017-02-12 NOTE — Therapy (Signed)
[facility] [place], [place], [address] Phone: [phone]   Fax:  [phone]  Physical Therapy Treatment  Patient Details  Name: Larry Bautista MRN: [id number] Date of Birth: [birth year] Referring Provider: Wandra Feinstein MD  Encounter Date: 02/12/2017      PT End of Session - 02/12/17 1035    Visit Number 2   Number of Visits 16   Date for PT Re-Evaluation 04/11/17   PT Start Time 4158   PT Stop Time 1111   PT Time Calculation (min) 42 min   Activity Tolerance Patient tolerated treatment well   Behavior During Therapy [facility] for tasks assessed/performed      Past Medical History:  Diagnosis Date  . Allergy   . CHF (congestive heart failure) ([place])   . Chronic knee pain   . Coronary artery disease    with LAD DE stent 2004  . Hyperlipidemia   . Hypertension   . Kidney stones   . Lower back pain    status post surgery for spondylolisthesis  . Migraine   . Persistent atrial fibrillation ([place])    longstanding persistent (since 05/2008)    Past Surgical History:  Procedure Laterality Date  . BACK SURGERY     spondylolisthesis  . CARDIAC CATHETERIZATION  03/06/2003  . CORONARY ANGIOPLASTY WITH STENT PLACEMENT  04/2003   CYPHER stent implantation in the LAD  . HAND SURGERY Right 2014   carpal tunnel  . SKIN CANCER EXCISION  1980's   Face  . VASCULAR SURGERY     Vascular Stent    There were no vitals filed for this visit.      Subjective Assessment - 02/12/17 1031    Subjective Patient reported some ongoing soreness in right shoulder esp reaching back   Patient Stated Goals Use right UE with less pain.   Currently in Pain? Yes   Pain Score 5    Pain Location Shoulder   Pain Orientation Right   Pain Descriptors / Indicators Aching   Pain Type Chronic pain   Pain Onset More than a month ago   Pain Frequency Constant   Aggravating Factors  reaching back   Pain Relieving Factors at rest                          [facility] Adult PT Treatment/Exercise - 02/12/17 0001      Exercises   Exercises Shoulder     Shoulder Exercises: Seated   External Rotation Strengthening;Right;AROM  no weight 3x10   External Rotation Limitations UE on table 90/90 abd/ER     Shoulder Exercises: Prone   Retraction Strengthening;Right;Weights  3x10   Retraction Weight (lbs) 2   Retraction Limitations kneeling position   Extension Strengthening;Right;Weights  3x10   Extension Weight (lbs) 2   Extension Limitations kneeling position     Shoulder Exercises: Standing   Protraction Strengthening;Right;Theraband  3x10   Theraband Level (Shoulder Protraction) Level 1 (Yellow)   External Rotation Strengthening;Right;Theraband  3x10 half range   Theraband Level (Shoulder External Rotation) Level 1 (Yellow)   External Rotation Limitations difficulty with full range   Internal Rotation Strengthening;Right;Theraband  half range 3x10   Theraband Level (Shoulder Internal Rotation) Level 1 (Yellow)   Internal Rotation Limitations difficulty with full range   Row Strengthening;Right;Theraband  3x10   Theraband Level (Shoulder Row) Level 1 (Yellow)     Shoulder Exercises: Pulleys   Flexion Other (comment)  81min  Other Pulley Exercises UE ranger for elevation and circles 2x10 each     Modalities   Modalities Electrical Stimulation;Moist Heat     Moist Heat Therapy   Number Minutes Moist Heat 15 Minutes   Moist Heat Location Shoulder     Electrical Stimulation   Electrical Stimulation Location Right shoulder   Electrical Stimulation Action IFC   Electrical Stimulation Parameters 80-150hz  x36min   Electrical Stimulation Goals Pain                  PT Short Term Goals - 02/10/17 1427      PT SHORT TERM GOAL #1   Title STG's=LTG's.           PT Long Term Goals - 02/12/17 1036      PT LONG TERM GOAL #1   Title Independent with a HEP.   Time 8   Period  Weeks   Status On-going     PT LONG TERM GOAL #2   Title Right shoulder ER/IR strength= 5/5 to increase stability for fucntional tasks.   Time 8   Period Weeks   Status On-going     PT LONG TERM GOAL #3   Title Active right shoulder flexion to 145 degrees so the patient can easily reach overhead   Time 8   Period Weeks   Status On-going     PT LONG TERM GOAL #4   Title Perform ADL's with pain not > 3/10.   Time 8   Period Weeks   Status On-going               Plan - 02/12/17 1101    Clinical Impression Statement Patient did fairy well today. Patient able to progress slowly with right shoulder ROM and strengthening. Patient had difficulty with pain and fatigue with ER/IR movements yet able to complate with half range/ comfortable range. Patient current goals ongoing due to pain, ROM and strength deficits.   Rehab Potential Good   PT Frequency 2x / week   PT Duration 8 weeks   PT Treatment/Interventions ADLs/Self Care Home Management;Cryotherapy;Electrical Stimulation;Moist Heat;Ultrasound;Therapeutic exercise;Therapeutic activities;Functional mobility training;Patient/family education;Manual techniques;Dry needling;Passive range of motion   PT Next Visit Plan cont with Pain-free pulleys; UE Ranger; STW/M and DN to posterior cuff region; RW4; [facility] ER; scapular strengthening.  Modalites PRN.   Consulted and Agree with Plan of Care Patient      Patient will benefit from skilled therapeutic intervention in order to improve the following deficits and impairments:  Decreased activity tolerance, Decreased range of motion, Decreased strength, Pain  Visit Diagnosis: Chronic right shoulder pain  Stiffness of right shoulder, not elsewhere classified  Muscle weakness (generalized)     Problem List Patient Active Problem List   Diagnosis Date Noted  . Dyspnea 10/01/2016  . Abnormal PFTs 10/01/2016  . Ex-smoker 10/01/2016  . Hyperglycemia 03/28/2015  . Allergic rhinitis  01/24/2015  . Arthritis 01/24/2015  . Basal cell carcinoma 01/24/2015  . BPH (benign prostatic hyperplasia) 01/24/2015  . Body mass index 27.0-27.9, adult 01/24/2015  . H/O renal calculi 01/24/2015  . Headache, migraine 01/24/2015  . AF (paroxysmal atrial fibrillation) ([place]) 01/24/2015  . Spinal stenosis 01/24/2015  . SPL (spondylolisthesis) 01/24/2015  . Benign paroxysmal positional nystagmus 01/24/2015  . Encounter for therapeutic drug monitoring 11/02/2013  . Hyperlipidemia 05/03/2009  . MIGRAINE HEADACHE 05/03/2009  . Essential hypertension 05/03/2009  . CAD in native artery 05/03/2009  . KNEE PAIN 05/03/2009  . Backache 05/03/2009  . History  of tobacco use 05/03/2009    Rahmon Heigl P, PTA 02/12/2017, 11:17 AM  [facility] [place], [place], [address] Phone: [phone]   Fax:  [phone]  Name: Larry Bautista MRN: [id number] Date of Birth: 1940-09-22

## 2017-02-17 ENCOUNTER — Encounter: Payer: Self-pay | Admitting: Physical Therapy

## 2017-02-17 ENCOUNTER — Ambulatory Visit: Payer: Medicare Other | Admitting: Physical Therapy

## 2017-02-17 DIAGNOSIS — M25611 Stiffness of right shoulder, not elsewhere classified: Secondary | ICD-10-CM

## 2017-02-17 DIAGNOSIS — G8929 Other chronic pain: Secondary | ICD-10-CM | POA: Diagnosis not present

## 2017-02-17 DIAGNOSIS — M25511 Pain in right shoulder: Principal | ICD-10-CM

## 2017-02-17 DIAGNOSIS — M6281 Muscle weakness (generalized): Secondary | ICD-10-CM | POA: Diagnosis not present

## 2017-02-17 NOTE — Therapy (Signed)
[facility] [place], [place], [address] Phone: [phone]   Fax:  [phone]  Physical Therapy Treatment  Patient Details  Name: Larry Bautista MRN: [id number] Date of Birth: 03/04/1941 Referring Provider: Wandra Feinstein MD  Encounter Date: 02/17/2017      PT End of Session - 02/17/17 1118    Visit Number 3   Number of Visits 16   Date for PT Re-Evaluation 04/11/17   PT Start Time 1116   PT Stop Time 1202   PT Time Calculation (min) 46 min   Activity Tolerance Patient tolerated treatment well   Behavior During Therapy [facility] for tasks assessed/performed      Past Medical History:  Diagnosis Date  . Allergy   . CHF (congestive heart failure) ([place])   . Chronic knee pain   . Coronary artery disease    with LAD DE stent 2004  . Hyperlipidemia   . Hypertension   . Kidney stones   . Lower back pain    status post surgery for spondylolisthesis  . Migraine   . Persistent atrial fibrillation ([place])    longstanding persistent (since 05/2008)    Past Surgical History:  Procedure Laterality Date  . BACK SURGERY     spondylolisthesis  . CARDIAC CATHETERIZATION  03/06/2003  . CORONARY ANGIOPLASTY WITH STENT PLACEMENT  04/2003   CYPHER stent implantation in the LAD  . HAND SURGERY Right 2014   carpal tunnel  . SKIN CANCER EXCISION  1980's   Face  . VASCULAR SURGERY     Vascular Stent    There were no vitals filed for this visit.      Subjective Assessment - 02/17/17 1117    Subjective Reports that his shoulder is really bothering him upon arrival in clinic. States that his pain can be as high as 5-6/10.   Patient Stated Goals Use right UE with less pain.   Currently in Pain? Yes   Pain Score 2    Pain Location Shoulder   Pain Orientation Right   Pain Descriptors / Indicators Discomfort   Pain Type Chronic pain   Pain Onset More than a month ago            [facility] PT Assessment - 02/17/17 0001      Assessment   Medical Diagnosis Right shoulder GH OA.   Next MD Visit None scheduled     Precautions   Precautions None     Restrictions   Weight Bearing Restrictions No                     [facility] Adult PT Treatment/Exercise - 02/17/17 0001      Shoulder Exercises: Supine   Protraction AROM;Right;20 reps  "a little" pain around R acromian ridge   External Rotation AROM;Both;5 reps  Attemptted but painful   Flexion AROM;Right;20 reps  discomfort towards end range     Shoulder Exercises: Standing   External Rotation Strengthening;Right;20 reps;Theraband   Theraband Level (Shoulder External Rotation) Level 1 (Yellow)   External Rotation Limitations difficulty with full range   Internal Rotation Strengthening;Right;20 reps;Theraband   Theraband Level (Shoulder Internal Rotation) Level 1 (Yellow)   Internal Rotation Limitations difficulty with full range   Extension Strengthening;Right;20 reps;Theraband   Theraband Level (Shoulder Extension) Level 1 (Yellow)   Row Strengthening;Right;20 reps;Theraband   Theraband Level (Shoulder Row) Level 1 (Yellow)     Shoulder Exercises: Pulleys   Flexion 3 minutes   Other Pulley  Exercises UE ranger for elevation and circles 2x10 each  pain with elevation exercises     Shoulder Exercises: ROM/Strengthening   UBE (Upper Arm Bike) 120 RPM x5 min     Modalities   Modalities Electrical Stimulation;Moist Heat     Moist Heat Therapy   Number Minutes Moist Heat 15 Minutes   Moist Heat Location Shoulder     Electrical Stimulation   Electrical Stimulation Location R shoulder   Electrical Stimulation Action IFC   Electrical Stimulation Parameters 1-10 hz x15 min   Electrical Stimulation Goals Pain                  PT Short Term Goals - 02/10/17 1427      PT SHORT TERM GOAL #1   Title STG's=LTG's.           PT Long Term Goals - 02/12/17 1036      PT LONG TERM GOAL #1   Title Independent with a HEP.   Time  8   Period Weeks   Status On-going     PT LONG TERM GOAL #2   Title Right shoulder ER/IR strength= 5/5 to increase stability for fucntional tasks.   Time 8   Period Weeks   Status On-going     PT LONG TERM GOAL #3   Title Active right shoulder flexion to 145 degrees so the patient can easily reach overhead   Time 8   Period Weeks   Status On-going     PT LONG TERM GOAL #4   Title Perform ADL's with pain not > 3/10.   Time 8   Period Weeks   Status On-going               Plan - 02/17/17 1151    Clinical Impression Statement Patient tolerated today's treatment fairly well today as exercises into flexion as well as shoulder rotation exercises were painful for patient. Patient educated at beginning of treatment that exercises should be pain free as to not exaggerate pain in clinic and that exercise could be adjusted or stopped based on pain. Light resistance completed today with weakness and pain with both R shoulder ER and IR. Patient reported experiencing greater discomfort with keeping his elbow close to his side for row with yellow theraband. Patient experienced end range R shoulder flexion pain in supine as well. No palpable tenderness upon assessment of patient's R shoulder. Normal modalities response noted following removal of the modalities. Goals remain on-going secondary to ROM, strength and pain limitations.   Rehab Potential Good   PT Frequency 2x / week   PT Duration 8 weeks   PT Treatment/Interventions ADLs/Self Care Home Management;Cryotherapy;Electrical Stimulation;Moist Heat;Ultrasound;Therapeutic exercise;Therapeutic activities;Functional mobility training;Patient/family education;Manual techniques;Dry needling;Passive range of motion   PT Next Visit Plan Continue with painfree strengthening and ROM exercises with modalities and manual therapy as needed per MPT POC.   Consulted and Agree with Plan of Care Patient      Patient will benefit from skilled  therapeutic intervention in order to improve the following deficits and impairments:  Decreased activity tolerance, Decreased range of motion, Decreased strength, Pain  Visit Diagnosis: Chronic right shoulder pain  Stiffness of right shoulder, not elsewhere classified  Muscle weakness (generalized)     Problem List Patient Active Problem List   Diagnosis Date Noted  . Dyspnea 10/01/2016  . Abnormal PFTs 10/01/2016  . Ex-smoker 10/01/2016  . Hyperglycemia 03/28/2015  . Allergic rhinitis 01/24/2015  . Arthritis 01/24/2015  .  Basal cell carcinoma 01/24/2015  . BPH (benign prostatic hyperplasia) 01/24/2015  . Body mass index 27.0-27.9, adult 01/24/2015  . H/O renal calculi 01/24/2015  . Headache, migraine 01/24/2015  . AF (paroxysmal atrial fibrillation) ([place]) 01/24/2015  . Spinal stenosis 01/24/2015  . SPL (spondylolisthesis) 01/24/2015  . Benign paroxysmal positional nystagmus 01/24/2015  . Encounter for therapeutic drug monitoring 11/02/2013  . Hyperlipidemia 05/03/2009  . MIGRAINE HEADACHE 05/03/2009  . Essential hypertension 05/03/2009  . CAD in native artery 05/03/2009  . KNEE PAIN 05/03/2009  . Backache 05/03/2009  . History of tobacco use 05/03/2009    Wynelle Fanny, PTA 02/17/2017, 12:04 PM  [facility] [address] [place], [place], [address] Phone: [phone]   Fax:  [phone]  Name: Larry Bautista MRN: [id number] Date of Birth: 11-20-1940

## 2017-02-19 ENCOUNTER — Ambulatory Visit: Payer: Medicare Other | Admitting: *Deleted

## 2017-02-19 DIAGNOSIS — M25611 Stiffness of right shoulder, not elsewhere classified: Secondary | ICD-10-CM | POA: Diagnosis not present

## 2017-02-19 DIAGNOSIS — M25511 Pain in right shoulder: Secondary | ICD-10-CM | POA: Diagnosis not present

## 2017-02-19 DIAGNOSIS — M6281 Muscle weakness (generalized): Secondary | ICD-10-CM

## 2017-02-19 DIAGNOSIS — G8929 Other chronic pain: Secondary | ICD-10-CM | POA: Diagnosis not present

## 2017-02-19 NOTE — Therapy (Signed)
[facility] [place], [place], [address] Phone: [phone]   Fax:  [phone]  Physical Therapy Treatment  Patient Details  Name: Larry Bautista MRN: [id number] Date of Birth: [birth year] Referring Provider: Wandra Feinstein MD  Encounter Date: 02/19/2017      PT End of Session - 02/19/17 1040    Visit Number 4   Number of Visits 16   Date for PT Re-Evaluation 04/11/17   PT Start Time 1030   PT Stop Time 1122   PT Time Calculation (min) 52 min      Past Medical History:  Diagnosis Date  . Allergy   . CHF (congestive heart failure) ([place])   . Chronic knee pain   . Coronary artery disease    with LAD DE stent 2004  . Hyperlipidemia   . Hypertension   . Kidney stones   . Lower back pain    status post surgery for spondylolisthesis  . Migraine   . Persistent atrial fibrillation ([place])    longstanding persistent (since 05/2008)    Past Surgical History:  Procedure Laterality Date  . BACK SURGERY     spondylolisthesis  . CARDIAC CATHETERIZATION  03/06/2003  . CORONARY ANGIOPLASTY WITH STENT PLACEMENT  04/2003   CYPHER stent implantation in the LAD  . HAND SURGERY Right 2014   carpal tunnel  . SKIN CANCER EXCISION  1980's   Face  . VASCULAR SURGERY     Vascular Stent    There were no vitals filed for this visit.      Subjective Assessment - 02/19/17 1039    Subjective Reports that his shoulder is really bothering him upon arrival in clinic. States that his pain can be as high as 5-6/10.   Patient Stated Goals Use right UE with less pain.   Currently in Pain? Yes   Pain Score 4    Pain Location Shoulder   Pain Orientation Right   Pain Descriptors / Indicators Discomfort   Pain Onset More than a month ago   Pain Frequency Constant                         [facility] Adult PT Treatment/Exercise - 02/19/17 0001      Shoulder Exercises: Standing   External Rotation Strengthening;Right;20  reps;Theraband   Theraband Level (Shoulder External Rotation) Level 1 (Yellow)   Extension Strengthening;Right;20 reps;Theraband   Theraband Level (Shoulder Extension) Level 1 (Yellow)   Row Strengthening;Right;20 reps;Theraband   Theraband Level (Shoulder Row) Level 1 (Yellow)     Shoulder Exercises: Pulleys   Flexion --  x 5 mins   Other Pulley Exercises UE ranger for elevation and circles      Shoulder Exercises: ROM/Strengthening   UBE (Upper Arm Bike) 120 RPM x5 min     Modalities   Modalities Electrical Stimulation;Moist Heat     Moist Heat Therapy   Number Minutes Moist Heat 15 Minutes   Moist Heat Location Shoulder     Electrical Stimulation   Electrical Stimulation Location R shoulder IFC x 15 mins   Electrical Stimulation Goals Pain                  PT Short Term Goals - 02/10/17 1427      PT SHORT TERM GOAL #1   Title STG's=LTG's.           PT Long Term Goals - 02/12/17 1036      PT LONG  TERM GOAL #1   Title Independent with a HEP.   Time 8   Period Weeks   Status On-going     PT LONG TERM GOAL #2   Title Right shoulder ER/IR strength= 5/5 to increase stability for fucntional tasks.   Time 8   Period Weeks   Status On-going     PT LONG TERM GOAL #3   Title Active right shoulder flexion to 145 degrees so the patient can easily reach overhead   Time 8   Period Weeks   Status On-going     PT LONG TERM GOAL #4   Title Perform ADL's with pain not > 3/10.   Time 8   Period Weeks   Status On-going               Plan - 02/19/17 1120    Clinical Impression Statement Pt arrived today in good spirits and was able to perform therex for RT shldr with minimal pain increase. He was able to perform light resistance tband for Rows and ER with minimal C/O pain and was given for HEP. ER was focused on with PROM and mainly pain at end-range 60 degrees. Normal response to modalities.   Clinical Presentation Evolving   Clinical Decision  Making Low   Rehab Potential Good   PT Frequency 2x / week   PT Duration 8 weeks   PT Treatment/Interventions ADLs/Self Care Home Management;Cryotherapy;Electrical Stimulation;Moist Heat;Ultrasound;Therapeutic exercise;Therapeutic activities;Functional mobility training;Patient/family education;Manual techniques;Dry needling;Passive range of motion   PT Next Visit Plan Continue with painfree strengthening and ROM exercises with modalities and manual therapy as needed per MPT POC.   Consulted and Agree with Plan of Care Patient      Patient will benefit from skilled therapeutic intervention in order to improve the following deficits and impairments:  Decreased activity tolerance, Decreased range of motion, Decreased strength, Pain  Visit Diagnosis: Chronic right shoulder pain  Stiffness of right shoulder, not elsewhere classified  Muscle weakness (generalized)     Problem List Patient Active Problem List   Diagnosis Date Noted  . Dyspnea 10/01/2016  . Abnormal PFTs 10/01/2016  . Ex-smoker 10/01/2016  . Hyperglycemia 03/28/2015  . Allergic rhinitis 01/24/2015  . Arthritis 01/24/2015  . Basal cell carcinoma 01/24/2015  . BPH (benign prostatic hyperplasia) 01/24/2015  . Body mass index 27.0-27.9, adult 01/24/2015  . H/O renal calculi 01/24/2015  . Headache, migraine 01/24/2015  . AF (paroxysmal atrial fibrillation) ([place]) 01/24/2015  . Spinal stenosis 01/24/2015  . SPL (spondylolisthesis) 01/24/2015  . Benign paroxysmal positional nystagmus 01/24/2015  . Encounter for therapeutic drug monitoring 11/02/2013  . Hyperlipidemia 05/03/2009  . MIGRAINE HEADACHE 05/03/2009  . Essential hypertension 05/03/2009  . CAD in native artery 05/03/2009  . KNEE PAIN 05/03/2009  . Backache 05/03/2009  . History of tobacco use 05/03/2009    Cadin Luka,[name], PTA 02/19/2017, 11:25 AM  [facility] [address] [place], [place], [address] Phone:  [phone]   Fax:  [phone]  Name: Larry Bautista MRN: [id number] Date of Birth: 09-23-1940

## 2017-02-23 ENCOUNTER — Ambulatory Visit: Payer: Medicare Other | Admitting: Physical Therapy

## 2017-02-23 DIAGNOSIS — M6281 Muscle weakness (generalized): Secondary | ICD-10-CM

## 2017-02-23 DIAGNOSIS — M25611 Stiffness of right shoulder, not elsewhere classified: Secondary | ICD-10-CM | POA: Diagnosis not present

## 2017-02-23 DIAGNOSIS — M25511 Pain in right shoulder: Secondary | ICD-10-CM

## 2017-02-23 DIAGNOSIS — G8929 Other chronic pain: Secondary | ICD-10-CM

## 2017-02-23 NOTE — Therapy (Signed)
[facility] [place], [place], [address] Phone: [phone]   Fax:  [phone]  Physical Therapy Treatment  Patient Details  Name: Larry Bautista MRN: [id number] Date of Birth: 04/11/1941 Referring Provider: Wandra Feinstein MD  Encounter Date: 02/23/2017      PT End of Session - 02/23/17 1233    Visit Number 5   Number of Visits 16   Date for PT Re-Evaluation 04/11/17   PT Start Time 1030   PT Stop Time 1120   PT Time Calculation (min) 50 min      Past Medical History:  Diagnosis Date  . Allergy   . CHF (congestive heart failure) ([place])   . Chronic knee pain   . Coronary artery disease    with LAD DE stent 2004  . Hyperlipidemia   . Hypertension   . Kidney stones   . Lower back pain    status post surgery for spondylolisthesis  . Migraine   . Persistent atrial fibrillation ([place])    longstanding persistent (since 05/2008)    Past Surgical History:  Procedure Laterality Date  . BACK SURGERY     spondylolisthesis  . CARDIAC CATHETERIZATION  03/06/2003  . CORONARY ANGIOPLASTY WITH STENT PLACEMENT  04/2003   CYPHER stent implantation in the LAD  . HAND SURGERY Right 2014   carpal tunnel  . SKIN CANCER EXCISION  1980's   Face  . VASCULAR SURGERY     Vascular Stent    There were no vitals filed for this visit.      Subjective Assessment - 02/23/17 1233    Subjective My pain a 2 today.   Patient Stated Goals Use right UE with less pain.   Pain Score 2    Pain Location Shoulder   Pain Orientation Right   Pain Descriptors / Indicators Discomfort   Pain Onset More than a month ago                         [facility] Adult PT Treatment/Exercise - 02/23/17 0001      Shoulder Exercises: Standing   Other Standing Exercises Yellow theraband ER on righ tto fatigue x 2 minutes.     Shoulder Exercises: Pulleys   Flexion Limitations 5 minutes.   Other Pulley Exercises UE Ranger x 3 minutes.      Shoulder Exercises: ROM/Strengthening   UBE (Upper Arm Bike) 120 RPM's 6 minutes (3 mins forward and 3 minutes backward).     Modalities   Modalities Ultrasound     Electrical Stimulation   Electrical Stimulation Location Right posrerior shoulder muculature.   Electrical Stimulation Action IFC.   Electrical Stimulation Parameters 80-150 Hz at 100% scan x 15 minutes.   Electrical Stimulation Goals Tone;Pain     Ultrasound   Ultrasound Location --  Right posterior shoulder musculature.   Ultrasound Parameters 1.50 W/CM2 x 7 minutes.                  PT Short Term Goals - 02/10/17 1427      PT SHORT TERM GOAL #1   Title STG's=LTG's.           PT Long Term Goals - 02/12/17 1036      PT LONG TERM GOAL #1   Title Independent with a HEP.   Time 8   Period Weeks   Status On-going     PT LONG TERM GOAL #2   Title Right shoulder  ER/IR strength= 5/5 to increase stability for fucntional tasks.   Time 8   Period Weeks   Status On-going     PT LONG TERM GOAL #3   Title Active right shoulder flexion to 145 degrees so the patient can easily reach overhead   Time 8   Period Weeks   Status On-going     PT LONG TERM GOAL #4   Title Perform ADL's with pain not > 3/10.   Time 8   Period Weeks   Status On-going               Plan - 02/23/17 1237    Clinical Impression Statement Excellent response to treatment today.        Patient will benefit from skilled therapeutic intervention in order to improve the following deficits and impairments:     Visit Diagnosis: Stiffness of right shoulder, not elsewhere classified  Chronic right shoulder pain  Muscle weakness (generalized)     Problem List Patient Active Problem List   Diagnosis Date Noted  . Dyspnea 10/01/2016  . Abnormal PFTs 10/01/2016  . Ex-smoker 10/01/2016  . Hyperglycemia 03/28/2015  . Allergic rhinitis 01/24/2015  . Arthritis 01/24/2015  . Basal cell carcinoma 01/24/2015  .  BPH (benign prostatic hyperplasia) 01/24/2015  . Body mass index 27.0-27.9, adult 01/24/2015  . H/O renal calculi 01/24/2015  . Headache, migraine 01/24/2015  . AF (paroxysmal atrial fibrillation) ([place]) 01/24/2015  . Spinal stenosis 01/24/2015  . SPL (spondylolisthesis) 01/24/2015  . Benign paroxysmal positional nystagmus 01/24/2015  . Encounter for therapeutic drug monitoring 11/02/2013  . Hyperlipidemia 05/03/2009  . MIGRAINE HEADACHE 05/03/2009  . Essential hypertension 05/03/2009  . CAD in native artery 05/03/2009  . KNEE PAIN 05/03/2009  . Backache 05/03/2009  . History of tobacco use 05/03/2009    Jhaniya Briski, [place] MPT 02/23/2017, 1:07 PM  [facility] [address] [place], [place], [address] Phone: [phone]   Fax:  [phone]  Name: HOLLEY WIRT MRN: [id number] Date of Birth: 1941/02/20

## 2017-02-26 ENCOUNTER — Ambulatory Visit: Payer: Medicare Other | Admitting: Physical Therapy

## 2017-02-26 ENCOUNTER — Encounter: Payer: Self-pay | Admitting: Physical Therapy

## 2017-02-26 DIAGNOSIS — M25611 Stiffness of right shoulder, not elsewhere classified: Secondary | ICD-10-CM

## 2017-02-26 DIAGNOSIS — G8929 Other chronic pain: Secondary | ICD-10-CM | POA: Diagnosis not present

## 2017-02-26 DIAGNOSIS — M25511 Pain in right shoulder: Secondary | ICD-10-CM

## 2017-02-26 DIAGNOSIS — M6281 Muscle weakness (generalized): Secondary | ICD-10-CM

## 2017-02-26 NOTE — Therapy (Signed)
[facility] [place], [place], [address] Phone: [phone]   Fax:  [phone]  Physical Therapy Treatment  Patient Details  Name: Larry Bautista MRN: [id number] Date of Birth: 1941-05-22 Referring Provider: Wandra Feinstein MD  Encounter Date: 02/26/2017      PT End of Session - 02/26/17 1300    Visit Number 6   Number of Visits 16   Date for PT Re-Evaluation 04/11/17   PT Start Time 1030   PT Stop Time 1120   PT Time Calculation (min) 50 min   Activity Tolerance Patient tolerated treatment well   Behavior During Therapy [facility] for tasks assessed/performed      Past Medical History:  Diagnosis Date  . Allergy   . CHF (congestive heart failure) ([place])   . Chronic knee pain   . Coronary artery disease    with LAD DE stent 2004  . Hyperlipidemia   . Hypertension   . Kidney stones   . Lower back pain    status post surgery for spondylolisthesis  . Migraine   . Persistent atrial fibrillation ([place])    longstanding persistent (since 05/2008)    Past Surgical History:  Procedure Laterality Date  . BACK SURGERY     spondylolisthesis  . CARDIAC CATHETERIZATION  03/06/2003  . CORONARY ANGIOPLASTY WITH STENT PLACEMENT  04/2003   CYPHER stent implantation in the LAD  . HAND SURGERY Right 2014   carpal tunnel  . SKIN CANCER EXCISION  1980's   Face  . VASCULAR SURGERY     Vascular Stent    There were no vitals filed for this visit.      Subjective Assessment - 02/26/17 1114    Subjective Pt arriving today complaining of 2/10 pain. Pt reporting having difficulty sleeping.    Patient Stated Goals Use right UE with less pain.   Currently in Pain? Yes   Pain Score 2    Pain Location Shoulder   Pain Orientation Right   Pain Descriptors / Indicators Aching;Tightness   Pain Onset More than a month ago   Pain Frequency Constant   Aggravating Factors  reaching backward   Pain Relieving Factors resting, ice, heat                          [facility] Adult PT Treatment/Exercise - 02/26/17 0001      Shoulder Exercises: Standing   Other Standing Exercises Yellow theraband ER on righ tto fatigue x 2 minutes.     Shoulder Exercises: Pulleys   Flexion Limitations 5 minutes   Other Pulley Exercises UE Ranger x 3 minutes.     Shoulder Exercises: ROM/Strengthening   UBE (Upper Arm Bike) 120 RPM's 6 minutes (3 mins forward and 3 minutes backward).     Modalities   Modalities Passenger transport manager Location Right posrerior shoulder muculature.   Electrical Stimulation Action IFC   Electrical Stimulation Parameters 80-150 Hz x 15 minutes, intensity to pt's tolreance.    Electrical Stimulation Goals Tone;Pain     Vasopneumatic   Number Minutes Vasopneumatic  115 minutes   Vasopnuematic Location  Shoulder   Vasopneumatic Pressure Medium   Vasopneumatic Temperature  34                PT Education - 02/26/17 1259    Education provided Yes   Education Details Reviewed HEP   Person(s) Educated Patient  Methods Explanation;Demonstration   Comprehension Verbalized understanding;Returned demonstration          PT Short Term Goals - 02/10/17 1427      PT SHORT TERM GOAL #1   Title STG's=LTG's.           PT Long Term Goals - 02/26/17 1303      PT LONG TERM GOAL #1   Title Independent with a HEP.   Time 8   Status On-going     PT LONG TERM GOAL #2   Title Right shoulder ER/IR strength= 5/5 to increase stability for fucntional tasks.   Time 8   Period Weeks   Status On-going     PT LONG TERM GOAL #3   Title Active right shoulder flexion to 145 degrees so the patient can easily reach overhead   Time 8   Period Weeks   Status On-going     PT LONG TERM GOAL #4   Title Perform ADL's with pain not > 3/10.   Time 8   Period Weeks   Status On-going               Plan - 02/26/17 1301     Clinical Impression Statement Pt tolerated treatment well today, limited by increased pain with UE ranger and pulleys today. Tolerated PROM and E-stim well reporting feeling better at end of session. Continue skilled PT to progress toward goals set.    Rehab Potential Good   PT Frequency 2x / week   PT Duration 8 weeks   PT Treatment/Interventions ADLs/Self Care Home Management;Cryotherapy;Electrical Stimulation;Moist Heat;Ultrasound;Therapeutic exercise;Therapeutic activities;Functional mobility training;Patient/family education;Manual techniques;Dry needling;Passive range of motion;Vasopneumatic Device   PT Next Visit Plan Continue with painfree strengthening and ROM exercises with modalities and manual therapy as needed per Larry Bautista POC.   Consulted and Agree with Plan of Care Patient      Patient will benefit from skilled therapeutic intervention in order to improve the following deficits and impairments:  Decreased activity tolerance, Decreased range of motion, Decreased strength, Pain  Visit Diagnosis: Stiffness of right shoulder, not elsewhere classified  Chronic right shoulder pain  Muscle weakness (generalized)     Problem List Patient Active Problem List   Diagnosis Date Noted  . Dyspnea 10/01/2016  . Abnormal PFTs 10/01/2016  . Ex-smoker 10/01/2016  . Hyperglycemia 03/28/2015  . Allergic rhinitis 01/24/2015  . Arthritis 01/24/2015  . Basal cell carcinoma 01/24/2015  . BPH (benign prostatic hyperplasia) 01/24/2015  . Body mass index 27.0-27.9, adult 01/24/2015  . H/O renal calculi 01/24/2015  . Headache, migraine 01/24/2015  . AF (paroxysmal atrial fibrillation) ([place]) 01/24/2015  . Spinal stenosis 01/24/2015  . SPL (spondylolisthesis) 01/24/2015  . Benign paroxysmal positional nystagmus 01/24/2015  . Encounter for therapeutic drug monitoring 11/02/2013  . Hyperlipidemia 05/03/2009  . MIGRAINE HEADACHE 05/03/2009  . Essential hypertension 05/03/2009  . CAD in native  artery 05/03/2009  . KNEE PAIN 05/03/2009  . Backache 05/03/2009  . History of tobacco use 05/03/2009    Larry Bautista, Larry Bautista  02/26/2017, 1:18 PM  [facility] [place], [place], [address] Phone: [phone]   Fax:  [phone]  Name: Larry Bautista MRN: [id number] Date of Birth: 1941-07-05

## 2017-03-02 ENCOUNTER — Ambulatory Visit: Payer: Medicare Other | Admitting: *Deleted

## 2017-03-02 DIAGNOSIS — M25611 Stiffness of right shoulder, not elsewhere classified: Secondary | ICD-10-CM

## 2017-03-02 DIAGNOSIS — M6281 Muscle weakness (generalized): Secondary | ICD-10-CM

## 2017-03-02 DIAGNOSIS — M25511 Pain in right shoulder: Secondary | ICD-10-CM

## 2017-03-02 DIAGNOSIS — G8929 Other chronic pain: Secondary | ICD-10-CM

## 2017-03-02 NOTE — Therapy (Signed)
[facility] [place], [place], [address] Phone: [phone]   Fax:  [phone]  Physical Therapy Treatment  Patient Details  Name: Larry Bautista MRN: [id number] Date of Birth: 09-15-1940 Referring Provider: Wandra Feinstein MD  Encounter Date: 03/02/2017      PT End of Session - 03/02/17 1404    Visit Number 7   Number of Visits 16   Date for PT Re-Evaluation 04/11/17   PT Start Time 1300   PT Stop Time 1351   PT Time Calculation (min) 51 min      Past Medical History:  Diagnosis Date  . Allergy   . CHF (congestive heart failure) ([place])   . Chronic knee pain   . Coronary artery disease    with LAD DE stent 2004  . Hyperlipidemia   . Hypertension   . Kidney stones   . Lower back pain    status post surgery for spondylolisthesis  . Migraine   . Persistent atrial fibrillation ([place])    longstanding persistent (since 05/2008)    Past Surgical History:  Procedure Laterality Date  . BACK SURGERY     spondylolisthesis  . CARDIAC CATHETERIZATION  03/06/2003  . CORONARY ANGIOPLASTY WITH STENT PLACEMENT  04/2003   CYPHER stent implantation in the LAD  . HAND SURGERY Right 2014   carpal tunnel  . SKIN CANCER EXCISION  1980's   Face  . VASCULAR SURGERY     Vascular Stent    There were no vitals filed for this visit.                       [facility] Adult PT Treatment/Exercise - 03/02/17 0001      Shoulder Exercises: Standing   Extension --   Theraband Level (Shoulder Extension) --   Other Standing Exercises --     Shoulder Exercises: Pulleys   Flexion Limitations 5 minutes   Other Pulley Exercises UE Ranger x 5 minutes.Flexion and circles each way     Shoulder Exercises: ROM/Strengthening   UBE (Upper Arm Bike) 120 RPM's 6 minutes (3 mins forward and 3 minutes backward).     Modalities   Modalities Lexicographer Location R shoulder IFC x 15 mins   Electrical Stimulation Goals Tone;Pain     Ultrasound   Ultrasound Location RT shldr   Ultrasound Parameters Combo 1.5 w/cm2 x 10 mins   Ultrasound Goals Pain     Vasopneumatic   Number Minutes Vasopneumatic  15 minutes   Vasopnuematic Location  Shoulder   Vasopneumatic Pressure Medium   Vasopneumatic Temperature  34                  PT Short Term Goals - 02/10/17 1427      PT SHORT TERM GOAL #1   Title STG's=LTG's.           PT Long Term Goals - 02/26/17 1303      PT LONG TERM GOAL #1   Title Independent with a HEP.   Time 8   Status On-going     PT LONG TERM GOAL #2   Title Right shoulder ER/IR strength= 5/5 to increase stability for fucntional tasks.   Time 8   Period Weeks   Status On-going     PT LONG TERM GOAL #3   Title Active right shoulder flexion to 145 degrees so the patient can easily reach  overhead   Time 8   Period Weeks   Status On-going     PT LONG TERM GOAL #4   Title Perform ADL's with pain not > 3/10.   Time 8   Period Weeks   Status On-going               Plan - 03/02/17 1405    Clinical Impression Statement Pt arrived to clinic today doing fairly well, but feels that his shldr continues to have pain when raising his RT arm. He was able to complete therex for RT UE with some discomfort with UE ranger. He did well with US/ Combo and had decreased pain end of Rx      Patient will benefit from skilled therapeutic intervention in order to improve the following deficits and impairments:     Visit Diagnosis: Stiffness of right shoulder, not elsewhere classified  Chronic right shoulder pain  Muscle weakness (generalized)     Problem List Patient Active Problem List   Diagnosis Date Noted  . Dyspnea 10/01/2016  . Abnormal PFTs 10/01/2016  . Ex-smoker 10/01/2016  . Hyperglycemia 03/28/2015  . Allergic rhinitis 01/24/2015  . Arthritis 01/24/2015  . Basal cell  carcinoma 01/24/2015  . BPH (benign prostatic hyperplasia) 01/24/2015  . Body mass index 27.0-27.9, adult 01/24/2015  . H/O renal calculi 01/24/2015  . Headache, migraine 01/24/2015  . AF (paroxysmal atrial fibrillation) ([place]) 01/24/2015  . Spinal stenosis 01/24/2015  . SPL (spondylolisthesis) 01/24/2015  . Benign paroxysmal positional nystagmus 01/24/2015  . Encounter for therapeutic drug monitoring 11/02/2013  . Hyperlipidemia 05/03/2009  . MIGRAINE HEADACHE 05/03/2009  . Essential hypertension 05/03/2009  . CAD in native artery 05/03/2009  . KNEE PAIN 05/03/2009  . Backache 05/03/2009  . History of tobacco use 05/03/2009    [name],[name], PTA 03/02/2017, 2:33 PM  [facility] [address] [place], [place], [address] Phone: [phone]   Fax:  [phone]  Name: Larry Bautista MRN: [id number] Date of Birth: 07/16/1941

## 2017-03-05 ENCOUNTER — Ambulatory Visit (INDEPENDENT_AMBULATORY_CARE_PROVIDER_SITE_OTHER): Payer: Medicare Other

## 2017-03-05 DIAGNOSIS — Z5181 Encounter for therapeutic drug level monitoring: Secondary | ICD-10-CM | POA: Diagnosis not present

## 2017-03-05 DIAGNOSIS — I4891 Unspecified atrial fibrillation: Secondary | ICD-10-CM | POA: Diagnosis not present

## 2017-03-05 DIAGNOSIS — I2510 Atherosclerotic heart disease of native coronary artery without angina pectoris: Secondary | ICD-10-CM

## 2017-03-05 LAB — POCT INR: INR: 2.3

## 2017-03-06 ENCOUNTER — Encounter: Payer: Medicare Other | Admitting: *Deleted

## 2017-03-09 ENCOUNTER — Ambulatory Visit: Payer: Medicare Other | Attending: Sports Medicine | Admitting: Physical Therapy

## 2017-03-09 ENCOUNTER — Encounter: Payer: Self-pay | Admitting: Physical Therapy

## 2017-03-09 DIAGNOSIS — M6281 Muscle weakness (generalized): Secondary | ICD-10-CM | POA: Diagnosis not present

## 2017-03-09 DIAGNOSIS — M25511 Pain in right shoulder: Secondary | ICD-10-CM | POA: Insufficient documentation

## 2017-03-09 DIAGNOSIS — M25611 Stiffness of right shoulder, not elsewhere classified: Secondary | ICD-10-CM | POA: Diagnosis not present

## 2017-03-09 DIAGNOSIS — G8929 Other chronic pain: Secondary | ICD-10-CM | POA: Insufficient documentation

## 2017-03-09 NOTE — Therapy (Signed)
[facility] [place], [place], [address] Phone: [phone]   Fax:  [phone]  Physical Therapy Treatment  Patient Details  Name: Larry Bautista MRN: [id number] Date of Birth: 05-25-1941 Referring Provider: Wandra Feinstein MD  Encounter Date: 03/09/2017      PT End of Session - 03/09/17 1115    Visit Number 8   Number of Visits 16   Date for PT Re-Evaluation 04/11/17   PT Start Time 1031   PT Stop Time 1119   PT Time Calculation (min) 48 min   Activity Tolerance Patient tolerated treatment well   Behavior During Therapy [facility] for tasks assessed/performed      Past Medical History:  Diagnosis Date  . Allergy   . CHF (congestive heart failure) ([place])   . Chronic knee pain   . Coronary artery disease    with LAD DE stent 2004  . Hyperlipidemia   . Hypertension   . Kidney stones   . Lower back pain    status post surgery for spondylolisthesis  . Migraine   . Persistent atrial fibrillation ([place])    longstanding persistent (since 05/2008)    Past Surgical History:  Procedure Laterality Date  . BACK SURGERY     spondylolisthesis  . CARDIAC CATHETERIZATION  03/06/2003  . CORONARY ANGIOPLASTY WITH STENT PLACEMENT  04/2003   CYPHER stent implantation in the LAD  . HAND SURGERY Right 2014   carpal tunnel  . SKIN CANCER EXCISION  1980's   Face  . VASCULAR SURGERY     Vascular Stent    There were no vitals filed for this visit.      Subjective Assessment - 03/09/17 1046    Subjective Patient reported increased pain today for unknown reason   Patient Stated Goals Use right UE with less pain.   Currently in Pain? Yes   Pain Score 4    Pain Location Shoulder   Pain Orientation Right   Pain Descriptors / Indicators Discomfort   Pain Type Chronic pain   Pain Onset More than a month ago   Pain Frequency Constant   Aggravating Factors  certain movements   Pain Relieving Factors rest            [facility]  PT Assessment - 03/09/17 0001      ROM / Strength   AROM / PROM / Strength AROM     AROM   AROM Assessment Site Shoulder   Right/Left Shoulder Right   Right Shoulder Flexion 130 Degrees                     [facility] Adult PT Treatment/Exercise - 03/09/17 0001      Shoulder Exercises: Standing   Other Standing Exercises wall slide 2x10     Shoulder Exercises: Pulleys   Flexion Limitations 5 minutes   Other Pulley Exercises UE Ranger Flexion and circles each way     Shoulder Exercises: ROM/Strengthening   UBE (Upper Arm Bike) 120 RPM's 6 minutes (3 mins forward and 3 minutes backward).     Acupuncturist Location R shoulder IFC x 15 mins   Electrical Stimulation Goals Tone;Pain     Vasopneumatic   Number Minutes Vasopneumatic  15 minutes   Vasopnuematic Location  Shoulder   Vasopneumatic Pressure Medium     Manual Therapy   Manual Therapy Soft tissue mobilization   Soft tissue mobilization gentle STW to deltoid and inf shoulder area of  pain                  PT Short Term Goals - 02/10/17 1427      PT SHORT TERM GOAL #1   Title STG's=LTG's.           PT Long Term Goals - 02/26/17 1303      PT LONG TERM GOAL #1   Title Independent with a HEP.   Time 8   Status On-going     PT LONG TERM GOAL #2   Title Right shoulder ER/IR strength= 5/5 to increase stability for fucntional tasks.   Time 8   Period Weeks   Status On-going     PT LONG TERM GOAL #3   Title Active right shoulder flexion to 145 degrees so the patient can easily reach overhead   Time 8   Period Weeks   Status On-going     PT LONG TERM GOAL #4   Title Perform ADL's with pain not > 3/10.   Time 8   Period Weeks   Status On-going               Plan - 03/09/17 1113    Clinical Impression Statement Patient tolerated treatment fairly well with reported increased soreness for unknown reason. Today patient able to complete exercises  with 4-5/10 pain. Performed manual STW to right shoulder to relief sore muscles. patient reported 1/10 pain post treatment. Patient current goals ongoing due to pain, ROM and strength deficts.    Rehab Potential Good   PT Frequency 2x / week   PT Duration 8 weeks   PT Treatment/Interventions ADLs/Self Care Home Management;Cryotherapy;Electrical Stimulation;Moist Heat;Ultrasound;Therapeutic exercise;Therapeutic activities;Functional mobility training;Patient/family education;Manual techniques;Dry needling;Passive range of motion;Vasopneumatic Device   PT Next Visit Plan Continue with painfree strengthening and ROM exercises with modalities and manual therapy as needed per MPT POC.   Consulted and Agree with Plan of Care Patient      Patient will benefit from skilled therapeutic intervention in order to improve the following deficits and impairments:  Decreased activity tolerance, Decreased range of motion, Decreased strength, Pain  Visit Diagnosis: Stiffness of right shoulder, not elsewhere classified  Chronic right shoulder pain  Muscle weakness (generalized)     Problem List Patient Active Problem List   Diagnosis Date Noted  . Dyspnea 10/01/2016  . Abnormal PFTs 10/01/2016  . Ex-smoker 10/01/2016  . Hyperglycemia 03/28/2015  . Allergic rhinitis 01/24/2015  . Arthritis 01/24/2015  . Basal cell carcinoma 01/24/2015  . BPH (benign prostatic hyperplasia) 01/24/2015  . Body mass index 27.0-27.9, adult 01/24/2015  . H/O renal calculi 01/24/2015  . Headache, migraine 01/24/2015  . AF (paroxysmal atrial fibrillation) ([place]) 01/24/2015  . Spinal stenosis 01/24/2015  . SPL (spondylolisthesis) 01/24/2015  . Benign paroxysmal positional nystagmus 01/24/2015  . Encounter for therapeutic drug monitoring 11/02/2013  . Hyperlipidemia 05/03/2009  . MIGRAINE HEADACHE 05/03/2009  . Essential hypertension 05/03/2009  . CAD in native artery 05/03/2009  . KNEE PAIN 05/03/2009  . Backache  05/03/2009  . History of tobacco use 05/03/2009    Larry Bautista P, PTA 03/09/2017, 11:20 AM  [facility] [place], [place], [address] Phone: [phone]   Fax:  [phone]  Name: Larry Bautista MRN: [id number] Date of Birth: Aug 30, 1941

## 2017-03-12 ENCOUNTER — Encounter: Payer: Self-pay | Admitting: Physical Therapy

## 2017-03-12 ENCOUNTER — Ambulatory Visit: Payer: Medicare Other | Admitting: Physical Therapy

## 2017-03-12 DIAGNOSIS — M25511 Pain in right shoulder: Secondary | ICD-10-CM | POA: Diagnosis not present

## 2017-03-12 DIAGNOSIS — G8929 Other chronic pain: Secondary | ICD-10-CM

## 2017-03-12 DIAGNOSIS — M6281 Muscle weakness (generalized): Secondary | ICD-10-CM | POA: Diagnosis not present

## 2017-03-12 DIAGNOSIS — M25611 Stiffness of right shoulder, not elsewhere classified: Secondary | ICD-10-CM | POA: Diagnosis not present

## 2017-03-12 NOTE — Therapy (Signed)
[facility] [place], [place], [address] Phone: [phone]   Fax:  [phone]  Physical Therapy Treatment  Patient Details  Name: Larry Bautista MRN: [id number] Date of Birth: July 30, 1941 Referring Provider: Wandra Feinstein MD  Encounter Date: 03/12/2017      PT End of Session - 03/12/17 1122    Visit Number 9   Number of Visits 16   Date for PT Re-Evaluation 04/11/17   PT Start Time 1114   PT Stop Time 1157   PT Time Calculation (min) 43 min   Activity Tolerance Patient tolerated treatment well   Behavior During Therapy [facility] for tasks assessed/performed      Past Medical History:  Diagnosis Date  . Allergy   . CHF (congestive heart failure) ([place])   . Chronic knee pain   . Coronary artery disease    with LAD DE stent 2004  . Hyperlipidemia   . Hypertension   . Kidney stones   . Lower back pain    status post surgery for spondylolisthesis  . Migraine   . Persistent atrial fibrillation ([place])    longstanding persistent (since 05/2008)    Past Surgical History:  Procedure Laterality Date  . BACK SURGERY     spondylolisthesis  . CARDIAC CATHETERIZATION  03/06/2003  . CORONARY ANGIOPLASTY WITH STENT PLACEMENT  04/2003   CYPHER stent implantation in the LAD  . HAND SURGERY Right 2014   carpal tunnel  . SKIN CANCER EXCISION  1980's   Face  . VASCULAR SURGERY     Vascular Stent    There were no vitals filed for this visit.      Subjective Assessment - 03/12/17 1117    Subjective Patient reported relief after last treatment and much better today   Patient Stated Goals Use right UE with less pain.   Currently in Pain? Yes   Pain Score 2    Pain Location Shoulder   Pain Orientation Right   Pain Descriptors / Indicators Discomfort   Pain Type Chronic pain   Pain Onset More than a month ago   Pain Frequency Intermittent   Aggravating Factors  sleeping on right side or prolong activity   Pain Relieving  Factors at rest                         [facility] Adult PT Treatment/Exercise - 03/12/17 0001      Shoulder Exercises: Standing   Other Standing Exercises wall slide 2x10     Shoulder Exercises: Pulleys   Flexion Limitations 5 minutes   Other Pulley Exercises UE Ranger Flexion and circles each way     Electrical Stimulation   Electrical Stimulation Location R shoulder IFC x 15 mins   Electrical Stimulation Goals Tone;Pain     Vasopneumatic   Number Minutes Vasopneumatic  15 minutes   Vasopnuematic Location  Shoulder   Vasopneumatic Pressure Medium     Manual Therapy   Manual Therapy Soft tissue mobilization   Soft tissue mobilization gentle STW to deltoid and inf shoulder area of pain                  PT Short Term Goals - 02/10/17 1427      PT SHORT TERM GOAL #1   Title STG's=LTG's.           PT Long Term Goals - 02/26/17 1303      PT LONG TERM GOAL #1  Title Independent with a HEP.   Time 8   Status On-going     PT LONG TERM GOAL #2   Title Right shoulder ER/IR strength= 5/5 to increase stability for fucntional tasks.   Time 8   Period Weeks   Status On-going     PT LONG TERM GOAL #3   Title Active right shoulder flexion to 145 degrees so the patient can easily reach overhead   Time 8   Period Weeks   Status On-going     PT LONG TERM GOAL #4   Title Perform ADL's with pain not > 3/10.   Time 8   Period Weeks   Status On-going               Plan - 03/12/17 1145    Clinical Impression Statement Patient tolerated treatment well today and reported less pain overall. Patient had a flare up earlier in week for unknown reason yet improvement after manual STW to shoulder. Patient has more pain when he sleeps on right shoulder. Patient would like to continue strengthening exercises and avoid any flare ups. Patient reported he will try not to sleep on right side. Patient current goals progressing yet ongoing due to pain and  strength deficts.    Rehab Potential Good   PT Frequency 2x / week   PT Duration 8 weeks   PT Treatment/Interventions ADLs/Self Care Home Management;Cryotherapy;Electrical Stimulation;Moist Heat;Ultrasound;Therapeutic exercise;Therapeutic activities;Functional mobility training;Patient/family education;Manual techniques;Dry needling;Passive range of motion;Vasopneumatic Device   PT Next Visit Plan Continue with gentle painfree strengthening and ROM exercises with modalities and manual therapy as needed per MPT POC.   Consulted and Agree with Plan of Care Patient      Patient will benefit from skilled therapeutic intervention in order to improve the following deficits and impairments:  Decreased activity tolerance, Decreased range of motion, Decreased strength, Pain  Visit Diagnosis: Stiffness of right shoulder, not elsewhere classified  Chronic right shoulder pain  Muscle weakness (generalized)     Problem List Patient Active Problem List   Diagnosis Date Noted  . Dyspnea 10/01/2016  . Abnormal PFTs 10/01/2016  . Ex-smoker 10/01/2016  . Hyperglycemia 03/28/2015  . Allergic rhinitis 01/24/2015  . Arthritis 01/24/2015  . Basal cell carcinoma 01/24/2015  . BPH (benign prostatic hyperplasia) 01/24/2015  . Body mass index 27.0-27.9, adult 01/24/2015  . H/O renal calculi 01/24/2015  . Headache, migraine 01/24/2015  . AF (paroxysmal atrial fibrillation) ([place]) 01/24/2015  . Spinal stenosis 01/24/2015  . SPL (spondylolisthesis) 01/24/2015  . Benign paroxysmal positional nystagmus 01/24/2015  . Encounter for therapeutic drug monitoring 11/02/2013  . Hyperlipidemia 05/03/2009  . MIGRAINE HEADACHE 05/03/2009  . Essential hypertension 05/03/2009  . CAD in native artery 05/03/2009  . KNEE PAIN 05/03/2009  . Backache 05/03/2009  . History of tobacco use 05/03/2009    Kabe Mckoy P, PTA 03/12/2017, 1:01 PM  [facility] [address] [place], [place], [address] Phone: [phone]   Fax:  [phone]  Name: MOHIT ZIRBES MRN: [id number] Date of Birth: 07-Aug-1941

## 2017-03-16 ENCOUNTER — Encounter: Payer: Self-pay | Admitting: Physical Therapy

## 2017-03-16 ENCOUNTER — Ambulatory Visit: Payer: Medicare Other | Admitting: Physical Therapy

## 2017-03-16 DIAGNOSIS — M25511 Pain in right shoulder: Secondary | ICD-10-CM

## 2017-03-16 DIAGNOSIS — M25611 Stiffness of right shoulder, not elsewhere classified: Secondary | ICD-10-CM | POA: Diagnosis not present

## 2017-03-16 DIAGNOSIS — M6281 Muscle weakness (generalized): Secondary | ICD-10-CM

## 2017-03-16 DIAGNOSIS — G8929 Other chronic pain: Secondary | ICD-10-CM

## 2017-03-16 NOTE — Therapy (Signed)
[facility] [place], [place], [address] Phone: [phone]   Fax:  [phone]  Physical Therapy Treatment  Patient Details  Name: Larry Bautista MRN: [id number] Date of Birth: [birth year] Referring Provider: Wandra Feinstein MD  Encounter Date: 03/16/2017      PT End of Session - 03/16/17 1435    Visit Number 10   Number of Visits 16   Date for PT Re-Evaluation 04/11/17   PT Start Time 1430   PT Stop Time 1522   PT Time Calculation (min) 52 min   Activity Tolerance Patient tolerated treatment well   Behavior During Therapy [facility] for tasks assessed/performed      Past Medical History:  Diagnosis Date  . Allergy   . CHF (congestive heart failure) ([place])   . Chronic knee pain   . Coronary artery disease    with LAD DE stent 2004  . Hyperlipidemia   . Hypertension   . Kidney stones   . Lower back pain    status post surgery for spondylolisthesis  . Migraine   . Persistent atrial fibrillation ([place])    longstanding persistent (since 05/2008)    Past Surgical History:  Procedure Laterality Date  . BACK SURGERY     spondylolisthesis  . CARDIAC CATHETERIZATION  03/06/2003  . CORONARY ANGIOPLASTY WITH STENT PLACEMENT  04/2003   CYPHER stent implantation in the LAD  . HAND SURGERY Right 2014   carpal tunnel  . SKIN CANCER EXCISION  1980's   Face  . VASCULAR SURGERY     Vascular Stent    There were no vitals filed for this visit.      Subjective Assessment - 03/16/17 1434    Subjective Reports that he thinks his shoulder is better but certain movements still cause pain.   Patient Stated Goals Use right UE with less pain.   Currently in Pain? Yes   Pain Score 2    Pain Location Shoulder   Pain Orientation Right   Pain Descriptors / Indicators Discomfort   Pain Type Chronic pain   Pain Onset More than a month ago   Pain Frequency Intermittent            [facility] PT Assessment - 03/16/17 0001      Assessment   Medical Diagnosis Right shoulder GH OA.   Next MD Visit None scheduled     Precautions   Precautions None     Restrictions   Weight Bearing Restrictions No                     [facility] Adult PT Treatment/Exercise - 03/16/17 0001      Shoulder Exercises: Supine   Protraction AROM;Right;20 reps   Flexion AROM;Both;20 reps   ABduction AROM;Both;20 reps  with elbow flexion     Shoulder Exercises: Sidelying   External Rotation AROM;Right;20 reps     Shoulder Exercises: Pulleys   Flexion 3 minutes     Shoulder Exercises: ROM/Strengthening   UBE (Upper Arm Bike) 90 RPM x5 min     Modalities   Modalities Electrical Stimulation;Ultrasound;Vasopneumatic     Electrical Stimulation   Electrical Stimulation Location R shoulder   Electrical Stimulation Action IFC   Electrical Stimulation Parameters 1-10 hz x15 min   Electrical Stimulation Goals Pain;Tone     Ultrasound   Ultrasound Location R lateral shoulder   Ultrasound Parameters 1.5 w/cm2, 100%, 1 mhz x10 min   Ultrasound Goals Pain  Vasopneumatic   Number Minutes Vasopneumatic  15 minutes   Vasopnuematic Location  Shoulder   Vasopneumatic Pressure Medium   Vasopneumatic Temperature  34     Manual Therapy   Manual Therapy Soft tissue mobilization   Soft tissue mobilization Gentle STW to R deltoids to reduce tone                  PT Short Term Goals - 02/10/17 1427      PT SHORT TERM GOAL #1   Title STG's=LTG's.           PT Long Term Goals - 02/26/17 1303      PT LONG TERM GOAL #1   Title Independent with a HEP.   Time 8   Status On-going     PT LONG TERM GOAL #2   Title Right shoulder ER/IR strength= 5/5 to increase stability for fucntional tasks.   Time 8   Period Weeks   Status On-going     PT LONG TERM GOAL #3   Title Active right shoulder flexion to 145 degrees so the patient can easily reach overhead   Time 8   Period Weeks   Status On-going     PT LONG  TERM GOAL #4   Title Perform ADL's with pain not > 3/10.   Time 8   Period Weeks   Status On-going               Plan - 03/16/17 1527    Clinical Impression Statement Patient tolerated today's treatment fairly well today although intermittant pain still present. Wall slides into flexion and supine AROM flexion completed today with pain experienced at end range. Discomfort also noted with supine AROM abduction with elbow flexion as well. Tone still present in R deltoids today with occasional palpation of sensitive area per patient report. [place] completed to R lateral shoulder with no complaints per patient. Normal modalities response noted following removal of the modalities.   Rehab Potential Good   PT Frequency 2x / week   PT Duration 8 weeks   PT Treatment/Interventions ADLs/Self Care Home Management;Cryotherapy;Electrical Stimulation;Moist Heat;Ultrasound;Therapeutic exercise;Therapeutic activities;Functional mobility training;Patient/family education;Manual techniques;Dry needling;Passive range of motion;Vasopneumatic Device   PT Next Visit Plan Continue with gentle painfree strengthening and ROM exercises with modalities and manual therapy as needed per MPT POC.   Consulted and Agree with Plan of Care Patient      Patient will benefit from skilled therapeutic intervention in order to improve the following deficits and impairments:  Decreased activity tolerance, Decreased range of motion, Decreased strength, Pain  Visit Diagnosis: Stiffness of right shoulder, not elsewhere classified  Chronic right shoulder pain  Muscle weakness (generalized)     Problem List Patient Active Problem List   Diagnosis Date Noted  . Dyspnea 10/01/2016  . Abnormal PFTs 10/01/2016  . Ex-smoker 10/01/2016  . Hyperglycemia 03/28/2015  . Allergic rhinitis 01/24/2015  . Arthritis 01/24/2015  . Basal cell carcinoma 01/24/2015  . BPH (benign prostatic hyperplasia) 01/24/2015  . Body mass index  27.0-27.9, adult 01/24/2015  . H/O renal calculi 01/24/2015  . Headache, migraine 01/24/2015  . AF (paroxysmal atrial fibrillation) ([place]) 01/24/2015  . Spinal stenosis 01/24/2015  . SPL (spondylolisthesis) 01/24/2015  . Benign paroxysmal positional nystagmus 01/24/2015  . Encounter for therapeutic drug monitoring 11/02/2013  . Hyperlipidemia 05/03/2009  . MIGRAINE HEADACHE 05/03/2009  . Essential hypertension 05/03/2009  . CAD in native artery 05/03/2009  . KNEE PAIN 05/03/2009  . Backache 05/03/2009  .  History of tobacco use 05/03/2009    Wynelle Fanny, PTA 03/16/2017, 3:32 PM  [facility] [address] [place], [place], [address] Phone: [phone]   Fax:  [phone]  Name: Larry Bautista MRN: [id number] Date of Birth: [birth year]

## 2017-03-19 ENCOUNTER — Ambulatory Visit: Payer: Medicare Other | Admitting: Physical Therapy

## 2017-03-19 DIAGNOSIS — M25611 Stiffness of right shoulder, not elsewhere classified: Secondary | ICD-10-CM

## 2017-03-19 DIAGNOSIS — M6281 Muscle weakness (generalized): Secondary | ICD-10-CM

## 2017-03-19 DIAGNOSIS — G8929 Other chronic pain: Secondary | ICD-10-CM | POA: Diagnosis not present

## 2017-03-19 DIAGNOSIS — M25511 Pain in right shoulder: Secondary | ICD-10-CM

## 2017-03-19 NOTE — Therapy (Signed)
[facility] [place], [place], [address] Phone: [phone]   Fax:  [phone]  Physical Therapy Treatment  Patient Details  Name: Larry Bautista MRN: [id number] Date of Birth: [birth year] Referring Provider: Wandra Feinstein MD  Encounter Date: 03/19/2017      PT End of Session - 03/19/17 1150    Visit Number 11   Number of Visits 16   Date for PT Re-Evaluation 04/11/17   PT Start Time 1032   PT Stop Time 1122   PT Time Calculation (min) 50 min   Activity Tolerance Patient tolerated treatment well   Behavior During Therapy [facility] for tasks assessed/performed      Past Medical History:  Diagnosis Date  . Allergy   . CHF (congestive heart failure) ([place])   . Chronic knee pain   . Coronary artery disease    with LAD DE stent 2004  . Hyperlipidemia   . Hypertension   . Kidney stones   . Lower back pain    status post surgery for spondylolisthesis  . Migraine   . Persistent atrial fibrillation ([place])    longstanding persistent (since 05/2008)    Past Surgical History:  Procedure Laterality Date  . BACK SURGERY     spondylolisthesis  . CARDIAC CATHETERIZATION  03/06/2003  . CORONARY ANGIOPLASTY WITH STENT PLACEMENT  04/2003   CYPHER stent implantation in the LAD  . HAND SURGERY Right 2014   carpal tunnel  . SKIN CANCER EXCISION  1980's   Face  . VASCULAR SURGERY     Vascular Stent    There were no vitals filed for this visit.      Subjective Assessment - 03/19/17 1152    Subjective Shoulder painful with crtain movements.   Patient Stated Goals Use right UE with less pain.   Pain Score 3    Pain Location Shoulder   Pain Orientation Right   Pain Type Chronic pain   Pain Onset More than a month ago     UBE x 10 minutes; pulleys x 5 minutes; UE ranger on wall x 4 minutes; yellow band assisted ER to fatigue x 1 and PROM (ant cap stretch) and ER stretch x 3 minutes.  IFC and HMP x 20 minutes.  Recommended  schedule appt with Orthopedic surgeon.                             PT Short Term Goals - 02/10/17 1427      PT SHORT TERM GOAL #1   Title STG's=LTG's.           PT Long Term Goals - 02/26/17 1303      PT LONG TERM GOAL #1   Title Independent with a HEP.   Time 8   Status On-going     PT LONG TERM GOAL #2   Title Right shoulder ER/IR strength= 5/5 to increase stability for fucntional tasks.   Time 8   Period Weeks   Status On-going     PT LONG TERM GOAL #3   Title Active right shoulder flexion to 145 degrees so the patient can easily reach overhead   Time 8   Period Weeks   Status On-going     PT LONG TERM GOAL #4   Title Perform ADL's with pain not > 3/10.   Time 8   Period Weeks   Status On-going  Patient will benefit from skilled therapeutic intervention in order to improve the following deficits and impairments:     Visit Diagnosis: Stiffness of right shoulder, not elsewhere classified  Chronic right shoulder pain  Muscle weakness (generalized)     Problem List Patient Active Problem List   Diagnosis Date Noted  . Dyspnea 10/01/2016  . Abnormal PFTs 10/01/2016  . Ex-smoker 10/01/2016  . Hyperglycemia 03/28/2015  . Allergic rhinitis 01/24/2015  . Arthritis 01/24/2015  . Basal cell carcinoma 01/24/2015  . BPH (benign prostatic hyperplasia) 01/24/2015  . Body mass index 27.0-27.9, adult 01/24/2015  . H/O renal calculi 01/24/2015  . Headache, migraine 01/24/2015  . AF (paroxysmal atrial fibrillation) ([place]) 01/24/2015  . Spinal stenosis 01/24/2015  . SPL (spondylolisthesis) 01/24/2015  . Benign paroxysmal positional nystagmus 01/24/2015  . Encounter for therapeutic drug monitoring 11/02/2013  . Hyperlipidemia 05/03/2009  . MIGRAINE HEADACHE 05/03/2009  . Essential hypertension 05/03/2009  . CAD in native artery 05/03/2009  . KNEE PAIN 05/03/2009  . Backache 05/03/2009  . History of tobacco use  05/03/2009    Kyanna Mahrt, [place] MPT 03/19/2017, 11:55 AM  [facility] [place], [place], [address] Phone: [phone]   Fax:  [phone]  Name: Larry Bautista MRN: [id number] Date of Birth: [birth year]

## 2017-03-23 ENCOUNTER — Ambulatory Visit: Payer: Medicare Other | Admitting: Physical Therapy

## 2017-03-23 DIAGNOSIS — M25611 Stiffness of right shoulder, not elsewhere classified: Secondary | ICD-10-CM | POA: Diagnosis not present

## 2017-03-23 DIAGNOSIS — M25511 Pain in right shoulder: Secondary | ICD-10-CM

## 2017-03-23 DIAGNOSIS — M6281 Muscle weakness (generalized): Secondary | ICD-10-CM | POA: Diagnosis not present

## 2017-03-23 DIAGNOSIS — G8929 Other chronic pain: Secondary | ICD-10-CM | POA: Diagnosis not present

## 2017-03-23 NOTE — Therapy (Signed)
[facility] [place], [place], [address] Phone: [phone]   Fax:  [phone]  Physical Therapy Treatment  Patient Details  Name: Larry Bautista MRN: [id number] Date of Birth: Mar 26, 1941 Referring Provider: Wandra Feinstein MD  Encounter Date: 03/23/2017      PT End of Session - 03/23/17 1631    Visit Number 12   Number of Visits 16   Date for PT Re-Evaluation 04/11/17   PT Start Time 0400   PT Stop Time 0454   PT Time Calculation (min) 54 min      Past Medical History:  Diagnosis Date  . Allergy   . CHF (congestive heart failure) ([place])   . Chronic knee pain   . Coronary artery disease    with LAD DE stent 2004  . Hyperlipidemia   . Hypertension   . Kidney stones   . Lower back pain    status post surgery for spondylolisthesis  . Migraine   . Persistent atrial fibrillation ([place])    longstanding persistent (since 05/2008)    Past Surgical History:  Procedure Laterality Date  . BACK SURGERY     spondylolisthesis  . CARDIAC CATHETERIZATION  03/06/2003  . CORONARY ANGIOPLASTY WITH STENT PLACEMENT  04/2003   CYPHER stent implantation in the LAD  . HAND SURGERY Right 2014   carpal tunnel  . SKIN CANCER EXCISION  1980's   Face  . VASCULAR SURGERY     Vascular Stent    There were no vitals filed for this visit.      Subjective Assessment - 03/23/17 1631    Subjective I'm gonna finish this week out and then do the exercises at home.   Patient Stated Goals Use right UE with less pain.   Pain Score 2    Pain Location Shoulder   Pain Orientation Right   Pain Descriptors / Indicators Discomfort   Pain Onset More than a month ago    Treatment:  UBE at 90 RPM's x 10 minutes f/b RW 4 with assist into ER to fatigue x 2.  ER/IR yellow band and punches and extension with red theraband.  Full can 1# to fatigue x 3 and sdly ER with 1# to fatigue x 3 f/b anterior and post right shoulder capsular stretching (4  minutes).  Pre-mod and HMP x 20 minutes.                                 PT Short Term Goals - 02/10/17 1427      PT SHORT TERM GOAL #1   Title STG's=LTG's.           PT Long Term Goals - 02/26/17 1303      PT LONG TERM GOAL #1   Title Independent with a HEP.   Time 8   Status On-going     PT LONG TERM GOAL #2   Title Right shoulder ER/IR strength= 5/5 to increase stability for fucntional tasks.   Time 8   Period Weeks   Status On-going     PT LONG TERM GOAL #3   Title Active right shoulder flexion to 145 degrees so the patient can easily reach overhead   Time 8   Period Weeks   Status On-going     PT LONG TERM GOAL #4   Title Perform ADL's with pain not > 3/10.   Time 8   Period Weeks  Status On-going             Patient will benefit from skilled therapeutic intervention in order to improve the following deficits and impairments:     Visit Diagnosis: Stiffness of right shoulder, not elsewhere classified  Chronic right shoulder pain  Muscle weakness (generalized)     Problem List Patient Active Problem List   Diagnosis Date Noted  . Dyspnea 10/01/2016  . Abnormal PFTs 10/01/2016  . Ex-smoker 10/01/2016  . Hyperglycemia 03/28/2015  . Allergic rhinitis 01/24/2015  . Arthritis 01/24/2015  . Basal cell carcinoma 01/24/2015  . BPH (benign prostatic hyperplasia) 01/24/2015  . Body mass index 27.0-27.9, adult 01/24/2015  . H/O renal calculi 01/24/2015  . Headache, migraine 01/24/2015  . AF (paroxysmal atrial fibrillation) ([place]) 01/24/2015  . Spinal stenosis 01/24/2015  . SPL (spondylolisthesis) 01/24/2015  . Benign paroxysmal positional nystagmus 01/24/2015  . Encounter for therapeutic drug monitoring 11/02/2013  . Hyperlipidemia 05/03/2009  . MIGRAINE HEADACHE 05/03/2009  . Essential hypertension 05/03/2009  . CAD in native artery 05/03/2009  . KNEE PAIN 05/03/2009  . Backache 05/03/2009  . History of tobacco use  05/03/2009    [place], [place] MPT 03/23/2017, 4:57 PM  [facility] [address] [place], [place], [address] Phone: [phone]   Fax:  [phone]  Name: PIER BOSHER MRN: [id number] Date of Birth: [birth year]

## 2017-03-26 ENCOUNTER — Ambulatory Visit: Payer: Medicare Other | Admitting: Physical Therapy

## 2017-03-26 DIAGNOSIS — M25511 Pain in right shoulder: Secondary | ICD-10-CM

## 2017-03-26 DIAGNOSIS — M25611 Stiffness of right shoulder, not elsewhere classified: Secondary | ICD-10-CM | POA: Diagnosis not present

## 2017-03-26 DIAGNOSIS — M6281 Muscle weakness (generalized): Secondary | ICD-10-CM

## 2017-03-26 DIAGNOSIS — G8929 Other chronic pain: Secondary | ICD-10-CM

## 2017-03-26 NOTE — Therapy (Signed)
[facility] [place], [place], [address] Phone: [phone]   Fax:  [phone]  Physical Therapy Treatment  Patient Details  Name: Larry Bautista MRN: [id number] Date of Birth: 10-Apr-1941 Referring Provider: Wandra Feinstein MD  Encounter Date: 03/26/2017      PT End of Session - 03/26/17 1358    Visit Number 13   Number of Visits 16   Date for PT Re-Evaluation 04/11/17   PT Start Time 0105   PT Stop Time 0140  Patient wanted a shorter treatment.   PT Time Calculation (min) 35 min   Activity Tolerance Patient tolerated treatment well   Behavior During Therapy [facility] for tasks assessed/performed      Past Medical History:  Diagnosis Date  . Allergy   . CHF (congestive heart failure) ([place])   . Chronic knee pain   . Coronary artery disease    with LAD DE stent 2004  . Hyperlipidemia   . Hypertension   . Kidney stones   . Lower back pain    status post surgery for spondylolisthesis  . Migraine   . Persistent atrial fibrillation ([place])    longstanding persistent (since 05/2008)    Past Surgical History:  Procedure Laterality Date  . BACK SURGERY     spondylolisthesis  . CARDIAC CATHETERIZATION  03/06/2003  . CORONARY ANGIOPLASTY WITH STENT PLACEMENT  04/2003   CYPHER stent implantation in the LAD  . HAND SURGERY Right 2014   carpal tunnel  . SKIN CANCER EXCISION  1980's   Face  . VASCULAR SURGERY     Vascular Stent    There were no vitals filed for this visit.      Subjective Assessment - 03/26/17 1400    Subjective Want to skip exercise today.   Patient Stated Goals Use right UE with less pain.   Pain Score 2    Pain Location Shoulder   Pain Orientation Right   Pain Descriptors / Indicators Discomfort   Pain Onset More than a month ago      Treatment:  U/S at 1.50 W/CM2 x 8 minutes to patient's left shoulder f/b IFC x 20 minutes.                             PT Short  Term Goals - 02/10/17 1427      PT SHORT TERM GOAL #1   Title STG's=LTG's.           PT Long Term Goals - 03/26/17 1406      PT LONG TERM GOAL #1   Title Independent with a HEP.   Time 8   Period Weeks   Status Achieved     PT LONG TERM GOAL #2   Title Right shoulder ER/IR strength= 5/5 to increase stability for fucntional tasks.   Time 8   Period Weeks   Status Not Met     PT LONG TERM GOAL #3   Title Active right shoulder flexion to 145 degrees so the patient can easily reach overhead   Time 8   Period Weeks   Status Not Met     PT LONG TERM GOAL #4   Title Perform ADL's with pain not > 3/10.   Time 8   Period Weeks   Status Not Met             Patient will benefit from skilled therapeutic intervention in order to improve the  following deficits and impairments:     Visit Diagnosis: Stiffness of right shoulder, not elsewhere classified  Chronic right shoulder pain  Muscle weakness (generalized)     Problem List Patient Active Problem List   Diagnosis Date Noted  . Dyspnea 10/01/2016  . Abnormal PFTs 10/01/2016  . Ex-smoker 10/01/2016  . Hyperglycemia 03/28/2015  . Allergic rhinitis 01/24/2015  . Arthritis 01/24/2015  . Basal cell carcinoma 01/24/2015  . BPH (benign prostatic hyperplasia) 01/24/2015  . Body mass index 27.0-27.9, adult 01/24/2015  . H/O renal calculi 01/24/2015  . Headache, migraine 01/24/2015  . AF (paroxysmal atrial fibrillation) ([place]) 01/24/2015  . Spinal stenosis 01/24/2015  . SPL (spondylolisthesis) 01/24/2015  . Benign paroxysmal positional nystagmus 01/24/2015  . Encounter for therapeutic drug monitoring 11/02/2013  . Hyperlipidemia 05/03/2009  . MIGRAINE HEADACHE 05/03/2009  . Essential hypertension 05/03/2009  . CAD in native artery 05/03/2009  . KNEE PAIN 05/03/2009  . Backache 05/03/2009  . History of tobacco use 05/03/2009   PHYSICAL THERAPY DISCHARGE SUMMARY  Visits from Start of Care: 13.  Current  functional level related to goals / functional outcomes: See above.   Remaining deficits: Continued left shoulder range of motion and pain with ADL's.   Education / Equipment: HEP. Plan: Patient agrees to discharge.  Patient goals were not met. Patient is being discharged due to meeting the stated rehab goals.  ?????      Chalonda Schlatter, Larry Bautista 03/26/2017, 2:06 PM  [facility] [address] [place], [place], [address] Phone: [phone]   Fax:  [phone]  Name: Larry Bautista MRN: [id number] Date of Birth: May 31, 1941

## 2017-03-31 ENCOUNTER — Other Ambulatory Visit: Payer: Self-pay | Admitting: Interventional Cardiology

## 2017-04-01 DIAGNOSIS — N401 Enlarged prostate with lower urinary tract symptoms: Secondary | ICD-10-CM | POA: Diagnosis not present

## 2017-04-01 DIAGNOSIS — R3911 Hesitancy of micturition: Secondary | ICD-10-CM | POA: Diagnosis not present

## 2017-04-01 DIAGNOSIS — N5201 Erectile dysfunction due to arterial insufficiency: Secondary | ICD-10-CM | POA: Diagnosis not present

## 2017-04-04 ENCOUNTER — Other Ambulatory Visit: Payer: Self-pay | Admitting: Interventional Cardiology

## 2017-04-06 DIAGNOSIS — L719 Rosacea, unspecified: Secondary | ICD-10-CM | POA: Diagnosis not present

## 2017-04-06 DIAGNOSIS — L812 Freckles: Secondary | ICD-10-CM | POA: Diagnosis not present

## 2017-04-06 DIAGNOSIS — L578 Other skin changes due to chronic exposure to nonionizing radiation: Secondary | ICD-10-CM | POA: Diagnosis not present

## 2017-04-06 DIAGNOSIS — L821 Other seborrheic keratosis: Secondary | ICD-10-CM | POA: Diagnosis not present

## 2017-04-06 DIAGNOSIS — D1800 Hemangioma unspecified site: Secondary | ICD-10-CM | POA: Diagnosis not present

## 2017-04-06 DIAGNOSIS — D485 Neoplasm of uncertain behavior of skin: Secondary | ICD-10-CM | POA: Diagnosis not present

## 2017-04-06 DIAGNOSIS — L820 Inflamed seborrheic keratosis: Secondary | ICD-10-CM | POA: Diagnosis not present

## 2017-04-06 DIAGNOSIS — D229 Melanocytic nevi, unspecified: Secondary | ICD-10-CM | POA: Diagnosis not present

## 2017-04-16 ENCOUNTER — Ambulatory Visit (INDEPENDENT_AMBULATORY_CARE_PROVIDER_SITE_OTHER): Payer: Medicare Other | Admitting: *Deleted

## 2017-04-16 DIAGNOSIS — I4891 Unspecified atrial fibrillation: Secondary | ICD-10-CM

## 2017-04-16 DIAGNOSIS — Z5181 Encounter for therapeutic drug level monitoring: Secondary | ICD-10-CM | POA: Diagnosis not present

## 2017-04-16 DIAGNOSIS — I2510 Atherosclerotic heart disease of native coronary artery without angina pectoris: Secondary | ICD-10-CM

## 2017-04-16 LAB — POCT INR: INR: 2.4

## 2017-05-06 DIAGNOSIS — Z23 Encounter for immunization: Secondary | ICD-10-CM | POA: Diagnosis not present

## 2017-05-18 DIAGNOSIS — H25813 Combined forms of age-related cataract, bilateral: Secondary | ICD-10-CM | POA: Diagnosis not present

## 2017-05-18 DIAGNOSIS — H52223 Regular astigmatism, bilateral: Secondary | ICD-10-CM | POA: Diagnosis not present

## 2017-05-18 DIAGNOSIS — H40013 Open angle with borderline findings, low risk, bilateral: Secondary | ICD-10-CM | POA: Diagnosis not present

## 2017-05-18 DIAGNOSIS — H5203 Hypermetropia, bilateral: Secondary | ICD-10-CM | POA: Diagnosis not present

## 2017-05-27 DIAGNOSIS — M19011 Primary osteoarthritis, right shoulder: Secondary | ICD-10-CM | POA: Diagnosis not present

## 2017-05-27 DIAGNOSIS — M25511 Pain in right shoulder: Secondary | ICD-10-CM | POA: Diagnosis not present

## 2017-05-28 ENCOUNTER — Ambulatory Visit (INDEPENDENT_AMBULATORY_CARE_PROVIDER_SITE_OTHER): Payer: Medicare Other | Admitting: *Deleted

## 2017-05-28 ENCOUNTER — Encounter (INDEPENDENT_AMBULATORY_CARE_PROVIDER_SITE_OTHER): Payer: Self-pay

## 2017-05-28 DIAGNOSIS — Z5181 Encounter for therapeutic drug level monitoring: Secondary | ICD-10-CM | POA: Diagnosis not present

## 2017-05-28 DIAGNOSIS — I4891 Unspecified atrial fibrillation: Secondary | ICD-10-CM

## 2017-05-28 LAB — POCT INR: INR: 2.2

## 2017-06-19 ENCOUNTER — Other Ambulatory Visit: Payer: Self-pay | Admitting: Interventional Cardiology

## 2017-06-29 ENCOUNTER — Other Ambulatory Visit: Payer: Self-pay | Admitting: Interventional Cardiology

## 2017-06-29 NOTE — Telephone Encounter (Signed)
Medication Detail    Disp Refills Start End   eplerenone (INSPRA) 25 MG tablet 30 tablet 10 11/26/2016    Sig - Route: Take 1 tablet (25 mg total) by mouth daily. - Oral   Sent to pharmacy as: eplerenone (INSPRA) 25 MG tablet   E-Prescribing Status: Receipt confirmed by pharmacy (11/26/2016 11:05 AM EDT)

## 2017-07-09 ENCOUNTER — Ambulatory Visit (INDEPENDENT_AMBULATORY_CARE_PROVIDER_SITE_OTHER): Payer: Medicare Other

## 2017-07-09 DIAGNOSIS — I480 Paroxysmal atrial fibrillation: Secondary | ICD-10-CM

## 2017-07-09 DIAGNOSIS — Z5181 Encounter for therapeutic drug level monitoring: Secondary | ICD-10-CM

## 2017-07-09 DIAGNOSIS — I4891 Unspecified atrial fibrillation: Secondary | ICD-10-CM | POA: Diagnosis not present

## 2017-07-09 LAB — POCT INR: INR: 3.2

## 2017-07-29 ENCOUNTER — Ambulatory Visit (INDEPENDENT_AMBULATORY_CARE_PROVIDER_SITE_OTHER): Payer: Medicare Other | Admitting: Pharmacist

## 2017-07-29 DIAGNOSIS — Z5181 Encounter for therapeutic drug level monitoring: Secondary | ICD-10-CM | POA: Diagnosis not present

## 2017-07-29 DIAGNOSIS — I4891 Unspecified atrial fibrillation: Secondary | ICD-10-CM | POA: Diagnosis not present

## 2017-07-29 DIAGNOSIS — I480 Paroxysmal atrial fibrillation: Secondary | ICD-10-CM

## 2017-07-29 LAB — POCT INR: INR: 2.2

## 2017-07-29 NOTE — Patient Instructions (Signed)
Continue same dosage of 1/2 tablet daily.  Recheck INR in 4 weeks. Coumadin clinic # [phone]. Main # [phone].

## 2017-08-10 ENCOUNTER — Ambulatory Visit (INDEPENDENT_AMBULATORY_CARE_PROVIDER_SITE_OTHER): Payer: Medicare Other | Admitting: Pulmonary Disease

## 2017-08-10 ENCOUNTER — Encounter: Payer: Self-pay | Admitting: Pulmonary Disease

## 2017-08-10 VITALS — BP 124/76 | HR 55 | Temp 97.5°F | Ht 68.0 in | Wt 193.0 lb

## 2017-08-10 DIAGNOSIS — R942 Abnormal results of pulmonary function studies: Secondary | ICD-10-CM

## 2017-08-10 DIAGNOSIS — E782 Mixed hyperlipidemia: Secondary | ICD-10-CM | POA: Diagnosis not present

## 2017-08-10 DIAGNOSIS — N138 Other obstructive and reflux uropathy: Secondary | ICD-10-CM | POA: Diagnosis not present

## 2017-08-10 DIAGNOSIS — I2510 Atherosclerotic heart disease of native coronary artery without angina pectoris: Secondary | ICD-10-CM

## 2017-08-10 DIAGNOSIS — M4317 Spondylolisthesis, lumbosacral region: Secondary | ICD-10-CM

## 2017-08-10 DIAGNOSIS — N401 Enlarged prostate with lower urinary tract symptoms: Secondary | ICD-10-CM

## 2017-08-10 DIAGNOSIS — I10 Essential (primary) hypertension: Secondary | ICD-10-CM

## 2017-08-10 DIAGNOSIS — R0609 Other forms of dyspnea: Secondary | ICD-10-CM | POA: Diagnosis not present

## 2017-08-10 DIAGNOSIS — Z87891 Personal history of nicotine dependence: Secondary | ICD-10-CM

## 2017-08-10 DIAGNOSIS — I480 Paroxysmal atrial fibrillation: Secondary | ICD-10-CM | POA: Diagnosis not present

## 2017-08-10 DIAGNOSIS — M4807 Spinal stenosis, lumbosacral region: Secondary | ICD-10-CM

## 2017-08-10 NOTE — Patient Instructions (Signed)
Today we updated your med list in our EPIC system...    Continue your current medications the same...  Stay as active as possible- both physically & mentally...  Call for any questions or if I can be of service in any way...  Let's plan a follow up visit in 34yr, sooner if needed for breathing problems.Marland KitchenMarland Kitchen

## 2017-08-10 NOTE — Progress Notes (Signed)
Subjective:     Patient ID: Larry Bautista, male   DOB: 12/04/1940, 76 y.o.   MRN: [id number]  HPI 76 y/o WM referred by Dr. Juanetta Beets, [facility], for a pulmonary evaluation due to intermittent wheezing and an abnormal spirometry test...   ~  I reviewed old EPIC records for pertinent DATA>> ~  CXR 04/05/14 showed norm heart size, tortuous Ao, clear lungs- NAD, DDD in Tspine...  ~  Spirometry 08/06/16 in DrFisher's office> FVC=2.20 (57%), FEV1=1.62 (59%), %1sec=74%, mid-flows reduced at 59% predicted; This is c/w a mod restrictive ventilatory defect w/ superimposed small airways dis.  ~  October 01, 2016:  Initial pulmonary consultation by SN>      [name] reports that he has been SOB & wheezing for ~27yr the symptoms are intermittent but seem worse supine per pt; his PCP has treated him for bronchitis w/ antibiotics and Pred w/ improvement but the symptoms return... He notes SOB/ DOE but states it's due to "lack of exercise"- ADLs are OK, stairs are a problem, carrying a load, etc; he says stable & no real change over the last yr... He's also noted a mild dry nagging cough & he is on an ACE for HBP and hx CAD...  Smoking Hx>  he is an ex-smoker, started ~15, smoked regularly 1ppd, transiently up to 2ppd, Quit 1991 at age [age]because "I didn't like being addicted"; total ~35 yrs smoking & ~40 pack-yr history...  Pulmonary Hx>  He denies hx lung problems until the last yr or so; denies hx asthma, pneumonia, TB or exposure, etc...  Medical Hx>  HBP, CAD w/ stent, AFib (followed by DrHSmith), HL, kidney stones/BPH, DJD/ LBP,   Family Hx>  Lung cancer in mother, heart dis & colon cancer in father, lung cancer & COPD in brother...  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  Current Meds>  No resp meds- on Coumadin, ToprolXL, Lisinopril, Eplerenone, Pravachol, Flomax, Neurontin    Immuniz status>  Epic records  indicate that he needs 2017 Flu vaccine & Pneumovax-23 shot...  EXAM shows Afeb, VSS, O2sat=95% on RA;  HEENT- neg, mallampati2;  Chest- clear x few basilar rhonchi, no consolidation;  Heart- Irreg AFib w/o m/r/g;  Abd- soft, nontender, neg;  Ext- neg w/o c/c/e;  Neuro- intact...   CXR 10/01/16> HE DID NOT GO TO XRAY FOR THE REQUESTED FILM..Marland KitchenMarland Kitchen Ambulatory Oximetry 10/01/16> O2sat=97% on RA at rest w/ pulse=68/min;  He walked 3 laps in the office (185'each) w/ lowest O2sat=94% w/ pulse=98/min...  LABS 10/01/16> HE DID NOT GO TO THE LAB FOR THE REQUESTED BLOOD WORK...  Full PFTs > scheduled and pending IMP  >>        DOE> likely multifactorial w/ components from lung/ heart/ deconditioning...      AbnSpirometry> it appears more restricted than obstructed, need to correlate to current imaging studies...      Ex-smoker> he quit in 1991 at age [age]w/ ~~30pack-yr smoking hx...      Cardiac issues> followed by DrHSmith-- HBP, CAD- s/p stent, AFib on coumadin...      Medical issues> followed by DrFisher-- HBP, HL, kidney stones/ BPH, DJD/ LBP PLAN >>   10/01/16:   We need some further assessment on Delquan's lungs w/ a current CXR, review blood work, and Full PFTs;  He does not desat w/ exercise;  The most important intervention at this point is to increase his exercise  program- eg [facility] the [facility], silver sneakers, or similar group;  We are holding off on additional meds until we can see this data...   ~  November 06, 2016:  26moROV w/ SN>  JMicoreports that he is about the same, but he is c/o more cough today, worse supine he thinks, does a lot of throat clearing;  He notes occas wheezing & reports that "allergies are really bad this yr" with runny nose & sneezing but "cough is from my meds";  He notes that he's been on the Lisinopril for >574yrnow & we wll check IgE & RASTs...    EXAM shows Afeb, VSS, O2sat=97% on RA;  HEENT- neg, mallampati2;  Chest- clear x min basilar rhonchi, no consolidation;  Heart- Irreg AFib w/o  m/r/g;  Abd- soft, nontender, neg;  Ext- neg w/o c/c/e;  Neuro- intact...   CXR 11/06/16>  Norm heart size, atherosclerotic Ao, sl pleural thickening bilat, min blunting left costophrenic angle...  Full PFTs 11/06/16>  FVC=2.45 (64%), FEV1=1.92 (70%), %1sec=78%; mid-flows are wnl at 85%; post bronchodil there was a sl response (FEV1 incr to 2.12 (77%) for a 10% improvement;  TLC=5.26 (79%), RV=2.63 (105%), RV/TLC=50&;  DLCO=75% predicted  This is c/w mild restriction, no real obstruction BUT a 10% improvement in FEV1 after the bronchodil), air trapping, & mild decr in DLCO noted...  LABS 11/06/16>  Chems- wnl w/ BS=98, Cr=0.80, LFTs wnl;  CBC- wnl w/ Hg=15.3;  TSH=0.47;  Sed=2;  IgE=13;  RAST- NEG x +grasses are borderline IMP >>         DOE> likely multifactorial w/ components from lung/ heart/ deconditioning...      AbnPFTs> it appears more restricted than obstructed, need to correlate to current imaging studies...      Ex-smoker> he quit in 1991 at age 76/ ~4~47ack-yr smoking hx...      Cardiac issues> followed by DrHSmith-- HBP, CAD- s/p stent, AFib on coumadin...      Medical issues> followed by DrFisher-- HBP, HL, kidney stones/ BPH, DJD/ LBP PLAN >>  10/01/16:   We need some further assessment on [name]'s lungs w/ a current CXR, review blood work, and Full PFTs;  He does not desat w/ exercise;  The most important intervention at this point is to increase his exercise program- eg at the Y, silver sneakers, or similar group;  We are holding off on additional meds until we can see this data...  11/06/16:   [name] persistent DOE and a mild cough- again these are most likely multifactorial in etiology w/ components from pulm/ cardiac/ deconditioning as noted; he is also on an ACE & although he quit smoking in 1991, he has been way too sedentary w/o exercise program; Labs are all WNL x borderline RAST pos to grasses... REC to STOP Lisinopril, monitor BP at home to see if additional med needed or not, deep  breathing exercises... we plan rov recheck in 46m81mo~  February 09, 2017:  46mo45mo & pulmonary follow up visit>  JerrGustavuser stopped the Lisinopril as directed after the last visit and now he indicates that he has a low BP ~80 in the AM, but improved to 110-120 range throughout the day;  He also notes sl chronic cough & throat clearing which could also be related to the ACE inhibitor;  He does note however that his SOB/DOE is sl better since he has started an exercise program- walking/ stairs/ etc...     He  saw DrSharma for Allergy 12/12/16>  Chr rhinorrhea, clear mucus, skin testing was pos for grass & weeds, food testing NEG;  They rec Zyrtek, Flonase, Atrovent nasal...    DOE is sl improved w/ starting an exercise program...    HBP on ToprolXL25, Lisin20 (he never stopped as prev instructed), Eplerenone25; BP= 114/66 & we discussed stopping the ACE inhib rx again, monitor BP at home & call for any questions...    CAD, Afib on Coumadin, ASA81 and followed by PCP-Dr.Don Fisher & CARDS- Dr.HSmith EXAM shows Afeb, VSS, O2sat=97% on RA;  HEENT- neg, mallampati2;  Chest- clear x min basilar rhonchi, no consolidation;  Heart- Irreg AFib w/o m/r/g;  Abd- soft, nontender, neg;  Ext- neg w/o c/c/e;  Neuro- intact... IMP/PLAN>>  Avraham is reminded to stop the Lisinopril completely & monitor his BP & symptoms going forward; hopefully his resp symptoms will resolve off the ACE + continued allergy rx per DrSharma;  He needs to maintain & push his exercise program...   ~  August 10, 2017:  72moROV & JCorionreturns noting that his breathing is stable to sl improved off the ACE inhib & improved exercise program although he is lim by arthritis pain knees & hx LBP w/ surg in past;  He notes mild cough, small amt tan mucus, no hemoptysis; he denies much SOB but notes some DOE w/ stairs etc; no CP/ palpit/ dizzy/ edema- recqll that he has AFib followed by DrHSmith for Cards... He rests well at night, denies snoring prob, daytime  sleepiness issues, etc...  We reviewed the following interval Epic notes>      He saw Urology-DrEskridge 76/25/18 for f/u BPH>  On Flomax0.4, PVR~100, last PSA was 09/2016= 2.10 (stable);  Rec to try cialis20 prn for ED...    [facility]  He met their goals for rehab & was discharged 03/26/17 after 13 treatments...     He is followed in the Coumadin Clinic regularly> most recently 07/29/17 on Coumadin 565mtabs- taking 1/2 tab daily x7d w/ INR= 2.2, continue same... We reviewed the following medical problems during today's office visit>     DOE> felt to be multifactorial w/ components from lung/ heart/ deconditioning...    AR> followed by DrSharma (eval 12/2016) & allergic to grasses & weeds on testing; Rx w/ Zyrtek, Flonase, Atrovent nasal; allergy shots were offered but not started.    AbnPFTs> he appears more restricted than obstructed based on his PFT, CXR shows basically clear, some mild incr bilat pleural stripe noted...    Ex-smoker> he quit in 1991 at age 76/ ~4~4ack-yr smoking hx...    Cardiac issues> followed by DrHSmith-- HBP, CAD- s/p stent, AFib on coumadin...    Medical issues> followed by DrFisher-- HBP, HL, kidney stones/ BPH, DJD/ LBP (DrNudelman is weaning his Neurontin); we stopped his ACE rx & he is improved...  EXAM shows Afeb, VSS, O2sat=98% on RA;  HEENT- neg, mallampati2;  Chest- clear x min basilar rhonchi, no consolidation;  Heart- Irreg AFib w/o m/r/g;  Abd- soft, nontender, neg;  Ext- neg w/o c/c/e;  Neuro- intact... IMP/PLAN>>  JeRonolds stable overall, and breathing sl better off ACE & on a mild exercise program; so far he has NOT required breathing meds/ inhalers- improved w/ his allergy meds per DrSharma- Zyrtek10, Flonase/ Atrovent nasal; we will continue to monitor...     Past Medical History:  Diagnosis Date  . Allergy   .  CHF (congestive heart failure) ([place])   . Chronic knee pain   . Coronary  artery disease    with LAD DE stent 2004  . Hyperlipidemia   . Hypertension   . Kidney stones   . Lower back pain    status post surgery for spondylolisthesis  . Migraine   . Persistent atrial fibrillation ([place])    longstanding persistent (since 05/2008)    Past Surgical History:  Procedure Laterality Date  . BACK SURGERY     spondylolisthesis  . CARDIAC CATHETERIZATION  03/06/2003  . CORONARY ANGIOPLASTY WITH STENT PLACEMENT  04/2003   CYPHER stent implantation in the LAD  . HAND SURGERY Right 2014   carpal tunnel  . SKIN CANCER EXCISION  1980's   Face  . VASCULAR SURGERY     Vascular Stent    Outpatient Encounter Medications as of 08/10/2017  Medication Sig  . aspirin 81 MG tablet Take 81 mg by mouth daily.  . cetirizine (ZYRTEC) 10 MG tablet Take 10 mg by mouth daily.  . diphenhydramine-acetaminophen (TYLENOL PM) 25-500 MG TABS Take 1 tablet by mouth at bedtime as needed.  [name] eplerenone (INSPRA) 25 MG tablet Take 1 tablet (25 mg total) by mouth daily.  . fish oil-omega-3 fatty acids 1000 MG capsule Take 2 capsules by mouth daily.   . fluticasone (FLONASE) 50 MCG/ACT nasal spray Place 2 sprays into both nostrils daily.  [name] gabapentin (NEURONTIN) 300 MG capsule Take 300 mg by mouth 2 (two) times daily.   . metoprolol succinate (TOPROL-XL) 25 MG 24 hr tablet Take 1 tablet (25 mg total) by mouth daily.  . multivitamin (THERAGRAN) per tablet Take 1 tablet by mouth daily.  . nitroGLYCERIN (NITROSTAT) 0.4 MG SL tablet Place 0.4 mg under the tongue every 5 (five) minutes as needed.  . pravastatin (PRAVACHOL) 80 MG tablet Take 1 tablet (80 mg total) by mouth daily.  . psyllium (METAMUCIL) 58.6 % powder Take 1 packet by mouth daily.   . tamsulosin (FLOMAX) 0.4 MG CAPS capsule Take 0.4 mg by mouth daily after supper.  . vitamin C (ASCORBIC ACID) 500 MG tablet Take 500 mg by mouth daily.  [name] warfarin (COUMADIN) 5 MG tablet TAKE ONE TABLET DAILY AS DIRECTED  . [DISCONTINUED] ipratropium  (ATROVENT) 0.06 % nasal spray Place 2 sprays into both nostrils 3 (three) times daily.   No facility-administered encounter medications on file as of 08/10/2017.     Allergies  Allergen Reactions  . Simvastatin Other (See Comments)    Leg pain  . Celecoxib Rash    Immunization History  Administered Date(s) Administered  . Influenza, High Dose Seasonal PF 05/01/2016, 04/08/2017  . Influenza-Unspecified 06/04/2015  . Pneumococcal Conjugate-13 03/28/2015  . Tdap 05/17/2012  . Zoster 11/04/2005    Current Medications, Allergies, Past Medical History, Past Surgical History, Family History, and Social History were reviewed in [employer] record.   Review of Systems             All symptoms NEG except where BOLDED >>  Constitutional:  F/C/S, fatigue, anorexia, unexpected weight change. HEENT:  HA, visual changes, hearing loss, earache, nasal symptoms, sore throat, mouth sores, hoarseness. Resp:  cough, sputum, hemoptysis; SOB, tightness, wheezing. Cardio:  CP, palpit, DOE, orthopnea, edema. GI:  N/V/D/C, blood in stool; reflux, abd pain, distention, gas. GU:  dysuria, freq, urgency, hematuria, flank pain, voiding difficulty. MS:  joint pain, swelling, tenderness, decr ROM; neck pain, back pain, etc. Neuro:  HA, tremors, seizures,  dizziness, syncope, weakness, numbness, gait abn. Skin:  suspicious lesions or skin rash. Heme:  adenopathy, bruising, bleeding. Psyche:  confusion, agitation, sleep disturbance, hallucinations, anxiety, depression suicidal.   Objective:   Physical Exam         Vital Signs:  Reviewed...   General:  WD, WN, 76 y/o WM in NAD; alert & oriented; pleasant & cooperative... HEENT:  [place]/AT; Conjunctiva- pink, Sclera- nonicteric, EOM-wnl, PERRLA, EACs-clear, TMs-wnl; NOSE-clear; THROAT-clear & wnl.  Neck:  Supple w/ fair ROM; no JVD; normal carotid impulses w/o bruits; no thyromegaly or nodules palpated; no lymphadenopathy.  Chest:  Clear  to P & A; without wheezes, rales, or rhonchi heard. Heart:  Irregular rhythm (AFIB); norm S1 & S2 without murmurs, rubs, or gallops detected. Abdomen:  Soft & nontender- no guarding or rebound; normal bowel sounds; no organomegaly or masses palpated. Ext:  Decr ROM; without deformities +arthritic changes; no varicose veins, +venous insuffic, tr edema;  Pulses intact w/o bruits. Neuro:  CNs II-XII intact; motor testing normal; sensory testing normal; gait normal & balance OK. Derm:  No lesions noted; no rash etc. Lymph:  No cervical, supraclavicular, axillary, or inguinal adenopathy palpated.   Assessment:      IMP>>        DOE> likely multifactorial w/ components from lung/ heart/ deconditioning...      AbnSpirometry> it appears more restricted than obstructed, need to correlate to current imaging studies...      Ex-smoker> he quit in 1991 at age 54 w/ ~19 pack-yr smoking hx...      Cardiac issues> followed by DrHSmith-- HBP, CAD- s/p stent, AFib on coumadin...      Medical issues> followed by DrFisher-- HBP, HL, kidney stones/ BPH, DJD/ LBP  PLAN>>       We need some further assessment on Luc's lungs w/ a current CXR, review blood work, and Full PFTs;  He does not desat w/ exercise;  The most important intervention at this point is to increase his exercise program- eg at the [facility], silver sneakers, or similar group;  We are holding off on additional meds until we can see this data...  11/06/16>   Rally has persistent DOE and a mild cough- again these are most likely multifactorial in etiology w/ components from pulm/ cardiac/ deconditioning as noted; he is also on an ACE & although he quit smoking in 1991, he has been way too sedentary w/o exercise program; Labs are all WNL x borderline RAST pos to grasses... REC to STOP Lisinopril, monitor BP at home to see if additional med needed or not, deep breathing exercises... we plan rov recheck in 101mo 02/09/17>   JRomeyis reminded to stop the Lisinopril  completely & monitor his BP & symptoms going forward; hopefully his resp symptoms will resolve off the ACE + continued allergy rx per DrSharma;  He needs to maintain & push his exercise program.     Plan:       Medication List        Accurate as of 08/10/17  3:04 PM. Always use your most recent med list.          aspirin 81 MG tablet   cetirizine 10 MG tablet Commonly known as:  ZYRTEC   diphenhydramine-acetaminophen 25-500 MG Tabs tablet Commonly known as:  TYLENOL PM   eplerenone 25 MG tablet Commonly known as:  INSPRA Take 1 tablet (25 mg total) by mouth daily.   fish oil-omega-3 fatty acids 1000 MG capsule   fluticasone  50 MCG/ACT nasal spray Commonly known as:  FLONASE Place 2 sprays into both nostrils daily.   gabapentin 300 MG capsule Commonly known as:  NEURONTIN   metoprolol succinate 25 MG 24 hr tablet Commonly known as:  TOPROL-XL Take 1 tablet (25 mg total) by mouth daily.   multivitamin per tablet   nitroGLYCERIN 0.4 MG SL tablet Commonly known as:  NITROSTAT   pravastatin 80 MG tablet Commonly known as:  PRAVACHOL Take 1 tablet (80 mg total) by mouth daily.   psyllium 58.6 % powder Commonly known as:  METAMUCIL   tamsulosin 0.4 MG Caps capsule Commonly known as:  FLOMAX   vitamin C 500 MG tablet Commonly known as:  ASCORBIC ACID   warfarin 5 MG tablet Commonly known as:  COUMADIN Take as directed by the anticoagulation clinic. If you are unsure how to take this medication, talk to your nurse or doctor. Original instructions:  TAKE ONE TABLET DAILY AS DIRECTED

## 2017-08-25 ENCOUNTER — Ambulatory Visit (INDEPENDENT_AMBULATORY_CARE_PROVIDER_SITE_OTHER): Payer: Medicare Other | Admitting: *Deleted

## 2017-08-25 DIAGNOSIS — I480 Paroxysmal atrial fibrillation: Secondary | ICD-10-CM

## 2017-08-25 DIAGNOSIS — Z5181 Encounter for therapeutic drug level monitoring: Secondary | ICD-10-CM

## 2017-08-25 DIAGNOSIS — I4891 Unspecified atrial fibrillation: Secondary | ICD-10-CM

## 2017-08-25 LAB — POCT INR: INR: 2.1

## 2017-08-25 NOTE — Patient Instructions (Signed)
Description   Continue same dosage of 1/2 tablet daily.  Recheck INR in 4 weeks. Coumadin clinic # [phone]. Main # [phone].

## 2017-09-22 ENCOUNTER — Ambulatory Visit (INDEPENDENT_AMBULATORY_CARE_PROVIDER_SITE_OTHER): Payer: Medicare Other | Admitting: *Deleted

## 2017-09-22 DIAGNOSIS — I480 Paroxysmal atrial fibrillation: Secondary | ICD-10-CM

## 2017-09-22 DIAGNOSIS — I4891 Unspecified atrial fibrillation: Secondary | ICD-10-CM

## 2017-09-22 DIAGNOSIS — Z5181 Encounter for therapeutic drug level monitoring: Secondary | ICD-10-CM

## 2017-09-22 LAB — POCT INR: INR: 2.2

## 2017-09-22 NOTE — Patient Instructions (Signed)
Description   Continue same dosage of 1/2 tablet daily.  Recheck INR in 6 weeks. Coumadin clinic # [phone]. Main # [phone].

## 2017-09-24 ENCOUNTER — Other Ambulatory Visit: Payer: Self-pay | Admitting: Interventional Cardiology

## 2017-09-24 DIAGNOSIS — M7732 Calcaneal spur, left foot: Secondary | ICD-10-CM | POA: Diagnosis not present

## 2017-09-24 DIAGNOSIS — M722 Plantar fascial fibromatosis: Secondary | ICD-10-CM | POA: Diagnosis not present

## 2017-10-13 DIAGNOSIS — M722 Plantar fascial fibromatosis: Secondary | ICD-10-CM | POA: Diagnosis not present

## 2017-10-13 DIAGNOSIS — M79671 Pain in right foot: Secondary | ICD-10-CM | POA: Diagnosis not present

## 2017-10-29 ENCOUNTER — Encounter: Payer: Self-pay | Admitting: Interventional Cardiology

## 2017-11-03 ENCOUNTER — Ambulatory Visit (INDEPENDENT_AMBULATORY_CARE_PROVIDER_SITE_OTHER): Payer: Medicare Other | Admitting: *Deleted

## 2017-11-03 DIAGNOSIS — Z5181 Encounter for therapeutic drug level monitoring: Secondary | ICD-10-CM

## 2017-11-03 DIAGNOSIS — I4891 Unspecified atrial fibrillation: Secondary | ICD-10-CM | POA: Diagnosis not present

## 2017-11-03 DIAGNOSIS — M722 Plantar fascial fibromatosis: Secondary | ICD-10-CM | POA: Diagnosis not present

## 2017-11-03 DIAGNOSIS — I480 Paroxysmal atrial fibrillation: Secondary | ICD-10-CM

## 2017-11-03 DIAGNOSIS — M79672 Pain in left foot: Secondary | ICD-10-CM | POA: Diagnosis not present

## 2017-11-03 LAB — POCT INR: INR: 1.6

## 2017-11-03 NOTE — Patient Instructions (Signed)
Description   Today take 1 tablet, then Continue same dosage of 1/2 tablet daily.  Recheck INR in 2 weeks. Coumadin clinic # [phone]. Main # [phone].

## 2017-11-05 ENCOUNTER — Ambulatory Visit: Payer: Medicare Other | Admitting: Interventional Cardiology

## 2017-11-12 ENCOUNTER — Ambulatory Visit (INDEPENDENT_AMBULATORY_CARE_PROVIDER_SITE_OTHER): Payer: Medicare Other

## 2017-11-12 VITALS — BP 144/88 | HR 68 | Temp 97.8°F | Ht 68.0 in | Wt 195.8 lb

## 2017-11-12 DIAGNOSIS — Z0000 Encounter for general adult medical examination without abnormal findings: Secondary | ICD-10-CM | POA: Diagnosis not present

## 2017-11-12 NOTE — Patient Instructions (Signed)
[name] , Thank you for taking time to come for your Medicare Wellness Visit. I appreciate your ongoing commitment to your health goals. Please review the following plan we discussed and let me know if I can assist you in the future.   Screening recommendations/referrals: Colonoscopy: Up to date Recommended yearly ophthalmology/optometry visit for glaucoma screening and checkup Recommended yearly dental visit for hygiene and checkup  Vaccinations: Influenza vaccine: Up to date Pneumococcal vaccine: Up to date Tdap vaccine: Up to date Shingles vaccine: Pt declines today.     Advanced directives: Please bring a copy of your POA (Power of Attorney) and/or Living Will to your next appointment.   Conditions/risks identified: Recommend decreasing amount of portions sizes by half and eating 3 meals a day with 2 healthy snacks in between.   Next appointment: 12/04/17 @ 2:00 PM with Dr Larry Bautista.   Preventive Care 78 Years and Older, Male Preventive care refers to lifestyle choices and visits with your health care provider that can promote health and wellness. What does preventive care include?  A yearly physical exam. This is also called an annual well check.  Dental exams once or twice a year.  Routine eye exams. Ask your health care provider how often you should have your eyes checked.  Personal lifestyle choices, including:  Daily care of your teeth and gums.  Regular physical activity.  Eating a healthy diet.  Avoiding tobacco and drug use.  Limiting alcohol use.  Practicing safe sex.  Taking low doses of aspirin every day.  Taking vitamin and mineral supplements as recommended by your health care provider. What happens during an annual well check? The services and screenings done by your health care provider during your annual well check will depend on your age, overall health, lifestyle risk factors, and family history of disease. Counseling  Your health care provider  may ask you questions about your:  Alcohol use.  Tobacco use.  Drug use.  Emotional well-being.  Home and relationship well-being.  Sexual activity.  Eating habits.  History of falls.  Memory and ability to understand (cognition).  Work and work Statistician. Screening  You may have the following tests or measurements:  Height, weight, and BMI.  Blood pressure.  Lipid and cholesterol levels. These may be checked every 5 years, or more frequently if you are over 42 years old.  Skin check.  Lung cancer screening. You may have this screening every year starting at age 6 if you have a 30-pack-year history of smoking and currently smoke or have quit within the past 15 years.  Fecal occult blood test (FOBT) of the stool. You may have this test every year starting at age 54.  Flexible sigmoidoscopy or colonoscopy. You may have a sigmoidoscopy every 5 years or a colonoscopy every 10 years starting at age 61.  Prostate cancer screening. Recommendations will vary depending on your family history and other risks.  Hepatitis C blood test.  Hepatitis B blood test.  Sexually transmitted disease (STD) testing.  Diabetes screening. This is done by checking your blood sugar (glucose) after you have not eaten for a while (fasting). You may have this done every 1-3 years.  Abdominal aortic aneurysm (AAA) screening. You may need this if you are a current or former smoker.  Osteoporosis. You may be screened starting at age 14 if you are at high risk. Talk with your health care provider about your test results, treatment options, and if necessary, the need for more tests. Vaccines  Your health care provider may recommend certain vaccines, such as:  Influenza vaccine. This is recommended every year.  Tetanus, diphtheria, and acellular pertussis (Tdap, Td) vaccine. You may need a Td booster every 10 years.  Zoster vaccine. You may need this after age 17.  Pneumococcal 13-valent  conjugate (PCV13) vaccine. One dose is recommended after age 14.  Pneumococcal polysaccharide (PPSV23) vaccine. One dose is recommended after age 55. Talk to your health care provider about which screenings and vaccines you need and how often you need them. This information is not intended to replace advice given to you by your health care provider. Make sure you discuss any questions you have with your health care provider. Document Released: 09/21/2015 Document Revised: 05/14/2016 Document Reviewed: 06/26/2015 Elsevier Interactive Patient Education  2017 [place] Prevention in the Home Falls can cause injuries. They can happen to people of all ages. There are many things you can do to make your home safe and to help prevent falls. What can I do on the outside of my home?  Regularly fix the edges of walkways and driveways and fix any cracks.  Remove anything that might make you trip as you walk through a door, such as a raised step or threshold.  Trim any bushes or trees on the path to your home.  Use bright outdoor lighting.  Clear any walking paths of anything that might make someone trip, such as rocks or tools.  Regularly check to see if handrails are loose or broken. Make sure that both sides of any steps have handrails.  Any raised decks and porches should have guardrails on the edges.  Have any leaves, snow, or ice cleared regularly.  Use sand or salt on walking paths during winter.  Clean up any spills in your garage right away. This includes oil or grease spills. What can I do in the bathroom?  Use night lights.  Install grab bars by the toilet and in the tub and shower. Do not use towel bars as grab bars.  Use non-skid mats or decals in the tub or shower.  If you need to sit down in the shower, use a plastic, non-slip stool.  Keep the floor dry. Clean up any water that spills on the floor as soon as it happens.  Remove soap buildup in the tub or  shower regularly.  Attach bath mats securely with double-sided non-slip rug tape.  Do not have throw rugs and other things on the floor that can make you trip. What can I do in the bedroom?  Use night lights.  Make sure that you have a light by your bed that is easy to reach.  Do not use any sheets or blankets that are too big for your bed. They should not hang down onto the floor.  Have a firm chair that has side arms. You can use this for support while you get dressed.  Do not have throw rugs and other things on the floor that can make you trip. What can I do in the kitchen?  Clean up any spills right away.  Avoid walking on wet floors.  Keep items that you use a lot in easy-to-reach places.  If you need to reach something above you, use a strong step stool that has a grab bar.  Keep electrical cords out of the way.  Do not use floor polish or wax that makes floors slippery. If you must use wax, use non-skid floor wax.  Do  not have throw rugs and other things on the floor that can make you trip. What can I do with my stairs?  Do not leave any items on the stairs.  Make sure that there are handrails on both sides of the stairs and use them. Fix handrails that are broken or loose. Make sure that handrails are as long as the stairways.  Check any carpeting to make sure that it is firmly attached to the stairs. Fix any carpet that is loose or worn.  Avoid having throw rugs at the top or bottom of the stairs. If you do have throw rugs, attach them to the floor with carpet tape.  Make sure that you have a light switch at the top of the stairs and the bottom of the stairs. If you do not have them, ask someone to add them for you. What else can I do to help prevent falls?  Wear shoes that:  Do not have high heels.  Have rubber bottoms.  Are comfortable and fit you well.  Are closed at the toe. Do not wear sandals.  If you use a stepladder:  Make sure that it is fully  opened. Do not climb a closed stepladder.  Make sure that both sides of the stepladder are locked into place.  Ask someone to hold it for you, if possible.  Clearly mark and make sure that you can see:  Any grab bars or handrails.  First and last steps.  Where the edge of each step is.  Use tools that help you move around (mobility aids) if they are needed. These include:  Canes.  Walkers.  Scooters.  Crutches.  Turn on the lights when you go into a dark area. Replace any light bulbs as soon as they burn out.  Set up your furniture so you have a clear path. Avoid moving your furniture around.  If any of your floors are uneven, fix them.  If there are any pets around you, be aware of where they are.  Review your medicines with your doctor. Some medicines can make you feel dizzy. This can increase your chance of falling. Ask your doctor what other things that you can do to help prevent falls. This information is not intended to replace advice given to you by your health care provider. Make sure you discuss any questions you have with your health care provider. Document Released: 06/21/2009 Document Revised: 01/31/2016 Document Reviewed: 09/29/2014 Elsevier Interactive Patient Education  2017 [employer].

## 2017-11-12 NOTE — Progress Notes (Signed)
Subjective:   Larry Bautista is a 77 y.o. male who presents for Medicare Annual/Subsequent preventive examination.  Review of Systems:  N/A  Cardiac Risk Factors include: advanced age (>45men, >15 women);dyslipidemia;male gender     Objective:    Vitals: BP (!) 144/88 (BP Location: Right Arm)   Pulse 68   Temp 97.8 F (36.6 C) (Oral)   Ht 5\' 8"  (1.727 m)   Wt 195 lb 12.8 oz (88.8 kg)   BMI 29.77 kg/m   Body mass index is 29.77 kg/m.  Advanced Directives 11/12/2017 02/26/2017 02/10/2017 07/09/2016 05/09/2015 03/28/2015  Does Patient Have a Medical Advance Directive? Yes Yes No;Yes Yes Yes Yes  Type of Paramedic of [place];Living will - - [place];Living will Living will;Healthcare Power of [place];Living will  Does patient want to make changes to medical advance directive? - Yes (Inpatient - patient defers changing a medical advance directive at this time) - - - -  Copy of [place] in Chart? No - copy requested - - No - copy requested - No - copy requested    Tobacco Social History   Tobacco Use  Smoking Status Former Smoker  . Types: Cigarettes  . Last attempt to quit: 11/06/1996  . Years since quitting: 21.0  Smokeless Tobacco Never Used     Counseling given: Not Answered   Clinical Intake:  Pre-visit preparation completed: Yes  Pain : No/denies pain Pain Score: 0-No pain     Nutritional Status: BMI 25 -29 Overweight Nutritional Risks: None Diabetes: No  How often do you need to have someone help you when you read instructions, pamphlets, or other written materials from your doctor or pharmacy?: 1 - Never  Interpreter Needed?: No  Information entered by :: [facility], LPN  Past Medical History:  Diagnosis Date  . Allergy   . CHF (congestive heart failure) ([place])   . Chronic knee pain   . Coronary artery disease    with LAD DE stent 2004  . Heel spur, left   .  Hyperlipidemia   . Hypertension   . Kidney stones   . Lower back pain    status post surgery for spondylolisthesis  . Migraine   . Persistent atrial fibrillation ([place])    longstanding persistent (since 05/2008)  . Plantar fasciitis    Past Surgical History:  Procedure Laterality Date  . BACK SURGERY     spondylolisthesis  . CARDIAC CATHETERIZATION  03/06/2003  . CORONARY ANGIOPLASTY WITH STENT PLACEMENT  04/2003   CYPHER stent implantation in the LAD  . HAND SURGERY Right 2014   carpal tunnel  . SKIN CANCER EXCISION  1980's   Face  . VASCULAR SURGERY     Vascular Stent   Family History  Problem Relation Age of Onset  . Heart attack Father   . Colon cancer Father   . Congestive Heart Failure Father   . Atrial fibrillation Brother   . Stroke Brother   . Lung cancer Mother   . Atrial fibrillation Brother   . COPD Brother   . Congestive Heart Failure Brother   . Lung cancer Brother   . Migraines Daughter    Social History   Socioeconomic History  . Marital status: Married    Spouse name: Kweku Stankey  . Number of children: 1  . Years of education: None  . Highest education level: Bachelor's degree (e.g., BA, AB, BS)  Social Needs  .  Financial resource strain: Not hard at all  . Food insecurity - worry: Never true  . Food insecurity - inability: Never true  . Transportation needs - medical: No  . Transportation needs - non-medical: No  Occupational History  . Occupation: retired  Tobacco Use  . Smoking status: Former Smoker    Types: Cigarettes    Last attempt to quit: 11/06/1996    Years since quitting: 21.0  . Smokeless tobacco: Never Used  Substance and Sexual Activity  . Alcohol use: No  . Drug use: No  . Sexual activity: None  Other Topics Concern  . None  Social History Narrative  . None    Outpatient Encounter Medications as of 11/12/2017  Medication Sig  . aspirin 81 MG tablet Take 81 mg by mouth daily.  . cetirizine (ZYRTEC) 10 MG tablet  Take 10 mg by mouth daily.  . diphenhydramine-acetaminophen (TYLENOL PM) 25-500 MG TABS Take 1 tablet by mouth at bedtime as needed.  [name] eplerenone (INSPRA) 25 MG tablet Take 1 tablet (25 mg total) by mouth daily. Please call [phone] to schedule annual thanks.  . fish oil-omega-3 fatty acids 1000 MG capsule Take 2 capsules by mouth daily.   . fluticasone (FLONASE) 50 MCG/ACT nasal spray Place 2 sprays into both nostrils daily.  [name] gabapentin (NEURONTIN) 300 MG capsule Take 300 mg by mouth at bedtime.   . meclizine (ANTIVERT) 12.5 MG tablet Take 12.5 mg by mouth 3 (three) times daily as needed for dizziness.  . metoprolol succinate (TOPROL-XL) 25 MG 24 hr tablet Take 1 tablet (25 mg total) by mouth daily.  . multivitamin (THERAGRAN) per tablet Take 1 tablet by mouth daily.  . nitroGLYCERIN (NITROSTAT) 0.4 MG SL tablet Place 0.4 mg under the tongue every 5 (five) minutes as needed.  . pravastatin (PRAVACHOL) 80 MG tablet Take 1 tablet (80 mg total) by mouth daily.  . psyllium (METAMUCIL) 58.6 % powder Take 1 packet by mouth daily.   . tamsulosin (FLOMAX) 0.4 MG CAPS capsule Take 0.4 mg by mouth daily after supper.  . vitamin C (ASCORBIC ACID) 500 MG tablet Take 500 mg by mouth daily.  [name] warfarin (COUMADIN) 5 MG tablet TAKE ONE TABLET DAILY AS DIRECTED   No facility-administered encounter medications on file as of 11/12/2017.     Activities of Daily Living In your present state of health, do you have any difficulty performing the following activities: 11/12/2017  Hearing? N  Vision? N  Difficulty concentrating or making decisions? N  Walking or climbing stairs? Y  Comment Due to arthritis pain in knees.  Dressing or bathing? N  Doing errands, shopping? N  Preparing Food and eating ? N  Using the Toilet? N  In the past six months, have you accidently leaked urine? N  Do you have problems with loss of bowel control? N  Managing your Medications? N  Managing your Finances? N  Housekeeping  or managing your Housekeeping? N  Some recent data might be hidden    Patient Care Team: [name], MD as PCP - General (Family Medicine) Belva Crome, MD as Consulting Physician (Cardiology) Jovita Gamma, MD as Consulting Physician (Neurosurgery) Festus Aloe, MD as Consulting Physician (Urology) Noralee Space, MD as Consulting Physician (Pulmonary Disease)   Assessment:   This is a routine wellness examination for [place].  Exercise Activities and Dietary recommendations Current Exercise Habits: The patient does not participate in regular exercise at present, Exercise limited by: Other - see comments(arthritis)  Goals    . DIET - REDUCE PORTION SIZE     Recommend decreasing amount of portions sizes by half and eating 3 meals a day with 2 healthy snacks in between.     . Exercise 150 minutes per week (moderate activity)       Fall Risk Fall Risk  11/12/2017 07/09/2016 05/13/2016 03/28/2015  Falls in the past year? No No No No   Is the patient's home free of loose throw rugs in walkways, pet beds, electrical cords, etc?   yes      Grab bars in the bathroom? no      Handrails on the stairs?   yes      Adequate lighting?   yes  Timed Get Up and Go Performed: N/A  Depression Screen PHQ 2/9 Scores 11/12/2017 07/09/2016 05/13/2016 03/28/2015  PHQ - 2 Score 0 0 0 0    Cognitive Function: Pt declined screening today.     6CIT Screen 07/09/2016  What Year? 0 points  What month? 0 points  What time? 0 points  Count back from 20 0 points  Months in reverse 0 points  Repeat phrase 0 points  Total Score 0    Immunization History  Administered Date(s) Administered  . Influenza, High Dose Seasonal PF 05/01/2016, 04/08/2017  . Influenza-Unspecified 06/04/2015  . Pneumococcal Conjugate-13 03/28/2015  . Tdap 05/17/2012  . Zoster 11/04/2005    Qualifies for Shingles Vaccine? Due for Shingles vaccine. Declined my offer to administer today. Education has been provided  regarding the importance of this vaccine. Pt has been advised to call her insurance company to determine her out of pocket expense. Advised she may also receive this vaccine at her local pharmacy or Health Dept. Verbalized acceptance and understanding.  Screening Tests Health Maintenance  Topic Date Due  . TETANUS/TDAP  05/17/2022  . INFLUENZA VACCINE  Completed  . PNA vac Low Risk Adult  Completed   Cancer Screenings: Lung: Low Dose CT Chest recommended if Age 67-80 years, 30 pack-year currently smoking OR have quit w/in 15years. Patient does not qualify. Colorectal: Up to date  Additional Screenings:  Hepatitis C Screening: N/A    Plan:  I have personally reviewed and addressed the Medicare Annual Wellness questionnaire and have noted the following in the patient's chart:  A. Medical and social history B. Use of alcohol, tobacco or illicit drugs  C. Current medications and supplements D. Functional ability and status E.  Nutritional status F.  Physical activity G. Advance directives H. List of other physicians I.  Hospitalizations, surgeries, and ER visits in previous 12 months J.  [place] such as hearing and vision if needed, cognitive and depression L. Referrals and appointments - none  In addition, I have reviewed and discussed with patient certain preventive protocols, quality metrics, and best practice recommendations. A written personalized care plan for preventive services as well as general preventive health recommendations were provided to patient.  See attached scanned questionnaire for additional information.   Signed,  Fabio Neighbors, LPN Nurse Health Advisor   Nurse Recommendations: None.

## 2017-11-17 ENCOUNTER — Ambulatory Visit (INDEPENDENT_AMBULATORY_CARE_PROVIDER_SITE_OTHER): Payer: Medicare Other | Admitting: *Deleted

## 2017-11-17 DIAGNOSIS — I4891 Unspecified atrial fibrillation: Secondary | ICD-10-CM | POA: Diagnosis not present

## 2017-11-17 DIAGNOSIS — Z5181 Encounter for therapeutic drug level monitoring: Secondary | ICD-10-CM

## 2017-11-17 DIAGNOSIS — I480 Paroxysmal atrial fibrillation: Secondary | ICD-10-CM | POA: Diagnosis not present

## 2017-11-17 LAB — POCT INR: INR: 2.6

## 2017-11-17 NOTE — Patient Instructions (Signed)
Description   Continue same dosage of coumadin 1/2 tablet daily. Recheck INR in 3 weeks. Coumadin clinic # [phone]. Main # [phone].     

## 2017-12-01 DIAGNOSIS — Z981 Arthrodesis status: Secondary | ICD-10-CM | POA: Diagnosis not present

## 2017-12-01 DIAGNOSIS — M4726 Other spondylosis with radiculopathy, lumbar region: Secondary | ICD-10-CM | POA: Diagnosis not present

## 2017-12-01 DIAGNOSIS — M5442 Lumbago with sciatica, left side: Secondary | ICD-10-CM | POA: Diagnosis not present

## 2017-12-01 DIAGNOSIS — G039 Meningitis, unspecified: Secondary | ICD-10-CM | POA: Diagnosis not present

## 2017-12-04 ENCOUNTER — Encounter: Payer: Self-pay | Admitting: Family Medicine

## 2017-12-04 ENCOUNTER — Ambulatory Visit (INDEPENDENT_AMBULATORY_CARE_PROVIDER_SITE_OTHER): Payer: Medicare Other | Admitting: Family Medicine

## 2017-12-04 VITALS — BP 138/82 | HR 78 | Temp 98.1°F | Resp 16 | Wt 191.0 lb

## 2017-12-04 DIAGNOSIS — I480 Paroxysmal atrial fibrillation: Secondary | ICD-10-CM | POA: Diagnosis not present

## 2017-12-04 DIAGNOSIS — I10 Essential (primary) hypertension: Secondary | ICD-10-CM

## 2017-12-04 DIAGNOSIS — R739 Hyperglycemia, unspecified: Secondary | ICD-10-CM | POA: Diagnosis not present

## 2017-12-04 DIAGNOSIS — E782 Mixed hyperlipidemia: Secondary | ICD-10-CM | POA: Diagnosis not present

## 2017-12-04 DIAGNOSIS — M4807 Spinal stenosis, lumbosacral region: Secondary | ICD-10-CM

## 2017-12-04 NOTE — Patient Instructions (Signed)
   The CDC recommends two doses of Shingrix (the shingles vaccine) separated by 2 to 6 months for adults age 78 years and older. I recommend checking with your pharmacy plan regarding coverage for this vaccine.     

## 2017-12-04 NOTE — Progress Notes (Signed)
Patient: Larry Bautista Male    DOB: 1940-11-16   77 y.o.   MRN: [id number] Visit Date: 12/04/2017  Today's Provider: Lelon Huh, MD   Chief Complaint  Patient presents with  . Annual wellness Exam   Subjective:     Patient saw [facility] for AWV on 11/12/2017.  HPI   Hypertension, follow-up:  BP Readings from Last 3 Encounters:  12/04/17 138/82  11/12/17 (!) 144/88  08/10/17 124/76    He was last seen for hypertension 1 years ago.  BP at that visit was 90/80. Management since that visit includes; no changes, followed by Cardiology.He reports good compliance with treatment. He is not having side effects.  He is not exercising. He is adherent to low salt diet.   Outside blood pressures are checked occasionally. He is experiencing none.  Patient denies exertional chest pressure/discomfort and lower extremity edema.    Hyperglycemia Lab Results  Component Value Date   HGBA1C 5.9 08/06/2016    AF (paroxysmal atrial fibrillation)  Fells well, no chest pains or palpations. Continue regular follow up Dr. Tamala Julian who is managing warfarin. No difficulty breathing.    Allergies  Allergen Reactions  . Simvastatin Other (See Comments)    Leg pain  . Celecoxib Rash     Current Outpatient Medications:  .  aspirin 81 MG tablet, Take 81 mg by mouth daily., Disp: , Rfl:  .  cetirizine (ZYRTEC) 10 MG tablet, Take 10 mg by mouth daily., Disp: , Rfl:  .  diphenhydramine-acetaminophen (TYLENOL PM) 25-500 MG TABS, Take 1 tablet by mouth at bedtime as needed., Disp: , Rfl:  .  eplerenone (INSPRA) 25 MG tablet, Take 1 tablet (25 mg total) by mouth daily. Please call [phone] to schedule annual thanks., Disp: 90 tablet, Rfl: 0 .  fish oil-omega-3 fatty acids 1000 MG capsule, Take 2 capsules by mouth daily. , Disp: , Rfl:  .  fluticasone (FLONASE) 50 MCG/ACT nasal spray, Place 2 sprays into both nostrils daily., Disp: 48 g, Rfl: 3 .  gabapentin (NEURONTIN) 300 MG capsule,  Take 300 mg by mouth at bedtime. , Disp: , Rfl:  .  meclizine (ANTIVERT) 12.5 MG tablet, Take 12.5 mg by mouth 3 (three) times daily as needed for dizziness., Disp: , Rfl:  .  metoprolol succinate (TOPROL-XL) 25 MG 24 hr tablet, Take 1 tablet (25 mg total) by mouth daily., Disp: 90 tablet, Rfl: 3 .  multivitamin (THERAGRAN) per tablet, Take 1 tablet by mouth daily., Disp: , Rfl:  .  nitroGLYCERIN (NITROSTAT) 0.4 MG SL tablet, Place 0.4 mg under the tongue every 5 (five) minutes as needed., Disp: , Rfl:  .  pravastatin (PRAVACHOL) 80 MG tablet, Take 1 tablet (80 mg total) by mouth daily., Disp: 90 tablet, Rfl: 2 .  psyllium (METAMUCIL) 58.6 % powder, Take 1 packet by mouth daily. , Disp: , Rfl:  .  tamsulosin (FLOMAX) 0.4 MG CAPS capsule, Take 0.4 mg by mouth daily after supper., Disp: , Rfl:  .  vitamin C (ASCORBIC ACID) 500 MG tablet, Take 500 mg by mouth daily., Disp: , Rfl:  .  warfarin (COUMADIN) 5 MG tablet, TAKE ONE TABLET DAILY AS DIRECTED, Disp: 90 tablet, Rfl: 0  Review of Systems  Constitutional: Negative for appetite change, chills and fever.  Respiratory: Negative for chest tightness, shortness of breath and wheezing.   Cardiovascular: Negative for chest pain and palpitations.  Gastrointestinal: Negative for abdominal pain, nausea and vomiting.  Social History   Tobacco Use  . Smoking status: Former Smoker    Types: Cigarettes    Last attempt to quit: 11/06/1996    Years since quitting: 21.0  . Smokeless tobacco: Never Used  Substance Use Topics  . Alcohol use: No   Objective:   BP 138/82 (BP Location: Left Arm, Patient Position: Sitting, Cuff Size: Normal)   Pulse 78   Temp 98.1 F (36.7 C)   Resp 16   Wt 191 lb (86.6 kg)   SpO2 99%   BMI 29.04 kg/m     Physical Exam   General Appearance:    Alert, cooperative, no distress  Eyes:    PERRL, conjunctiva/corneas clear, EOM's intact       Lungs:     Clear to auscultation bilaterally, respirations unlabored    Heart:     Irregularly irregular rhythm. Normal rate   Neurologic:   Awake, alert, oriented x 3. No apparent focal neurological           defect.           Assessment & Plan:     1. Essential hypertension Well controlled.  Continue current medications.    2. AF (paroxysmal atrial fibrillation) (HCC) Asymptomatic. Compliant with medication.  Continue aggressive risk factor modification.  Continue regular follow up and anti-coagulation management by Dr. Tamala Julian.   3. Mixed hyperlipidemia He is tolerating pravastatin well with no adverse effects.   - Comprehensive metabolic panel - Lipid panel  4. Spinal stenosis of lumbosacral region Doing well with gabapentin managed by Dr. Maryjean Ka  5. Hyperglycemia Due to check- Hemoglobin A1c       Lelon Huh, MD  [facility]

## 2017-12-08 ENCOUNTER — Ambulatory Visit (INDEPENDENT_AMBULATORY_CARE_PROVIDER_SITE_OTHER): Payer: Medicare Other

## 2017-12-08 ENCOUNTER — Other Ambulatory Visit (INDEPENDENT_AMBULATORY_CARE_PROVIDER_SITE_OTHER): Payer: Medicare Other

## 2017-12-08 ENCOUNTER — Ambulatory Visit (INDEPENDENT_AMBULATORY_CARE_PROVIDER_SITE_OTHER): Payer: Medicare Other | Admitting: *Deleted

## 2017-12-08 ENCOUNTER — Ambulatory Visit (INDEPENDENT_AMBULATORY_CARE_PROVIDER_SITE_OTHER): Payer: Medicare Other | Admitting: Pulmonary Disease

## 2017-12-08 ENCOUNTER — Ambulatory Visit (INDEPENDENT_AMBULATORY_CARE_PROVIDER_SITE_OTHER)
Admission: RE | Admit: 2017-12-08 | Discharge: 2017-12-08 | Disposition: A | Discharge status: Home / Self Care | Payer: Medicare Other | Source: Ambulatory Visit | Attending: Pulmonary Disease | Admitting: Pulmonary Disease

## 2017-12-08 VITALS — BP 126/82 | HR 55 | Temp 97.4°F | Ht 68.0 in | Wt 189.0 lb

## 2017-12-08 DIAGNOSIS — Z87891 Personal history of nicotine dependence: Secondary | ICD-10-CM | POA: Diagnosis not present

## 2017-12-08 DIAGNOSIS — J45901 Unspecified asthma with (acute) exacerbation: Secondary | ICD-10-CM

## 2017-12-08 DIAGNOSIS — J449 Chronic obstructive pulmonary disease, unspecified: Secondary | ICD-10-CM | POA: Insufficient documentation

## 2017-12-08 DIAGNOSIS — R0609 Other forms of dyspnea: Secondary | ICD-10-CM

## 2017-12-08 DIAGNOSIS — Z5181 Encounter for therapeutic drug level monitoring: Secondary | ICD-10-CM | POA: Diagnosis not present

## 2017-12-08 DIAGNOSIS — I4891 Unspecified atrial fibrillation: Secondary | ICD-10-CM | POA: Diagnosis not present

## 2017-12-08 DIAGNOSIS — J301 Allergic rhinitis due to pollen: Secondary | ICD-10-CM

## 2017-12-08 DIAGNOSIS — R0600 Dyspnea, unspecified: Secondary | ICD-10-CM | POA: Diagnosis not present

## 2017-12-08 DIAGNOSIS — I10 Essential (primary) hypertension: Secondary | ICD-10-CM | POA: Diagnosis not present

## 2017-12-08 DIAGNOSIS — I480 Paroxysmal atrial fibrillation: Secondary | ICD-10-CM

## 2017-12-08 DIAGNOSIS — R942 Abnormal results of pulmonary function studies: Secondary | ICD-10-CM

## 2017-12-08 LAB — CBC WITH DIFFERENTIAL/PLATELET
BASOS PCT: 1.1 % (ref 0.0–3.0)
Basophils Absolute: 0.1 10*3/uL (ref 0.0–0.1)
EOS ABS: 0.3 10*3/uL (ref 0.0–0.7)
Eosinophils Relative: 3 % (ref 0.0–5.0)
HCT: 44.5 % (ref 39.0–52.0)
Hemoglobin: 15.2 g/dL (ref 13.0–17.0)
LYMPHS ABS: 2 10*3/uL (ref 0.7–4.0)
Lymphocytes Relative: 22.7 % (ref 12.0–46.0)
MCHC: 34.1 g/dL (ref 30.0–36.0)
MCV: 92.9 fl (ref 78.0–100.0)
MONO ABS: 0.9 10*3/uL (ref 0.1–1.0)
Monocytes Relative: 9.8 % (ref 3.0–12.0)
NEUTROS ABS: 5.5 10*3/uL (ref 1.4–7.7)
NEUTROS PCT: 63.4 % (ref 43.0–77.0)
PLATELETS: 220 10*3/uL (ref 150.0–400.0)
RBC: 4.79 Mil/uL (ref 4.22–5.81)
RDW: 13.6 % (ref 11.5–15.5)
WBC: 8.6 10*3/uL (ref 4.0–10.5)

## 2017-12-08 LAB — COMPREHENSIVE METABOLIC PANEL
ALT: 31 U/L (ref 0–53)
AST: 40 U/L — AB (ref 0–37)
Albumin: 4.2 g/dL (ref 3.5–5.2)
Alkaline Phosphatase: 58 U/L (ref 39–117)
BUN: 15 mg/dL (ref 6–23)
CHLORIDE: 102 meq/L (ref 96–112)
CO2: 32 meq/L (ref 19–32)
CREATININE: 0.71 mg/dL (ref 0.40–1.50)
Calcium: 9.6 mg/dL (ref 8.4–10.5)
GFR: 113.98 mL/min (ref >60.00)
Glucose, Bld: 90 mg/dL (ref 70–99)
POTASSIUM: 4.2 meq/L (ref 3.5–5.1)
SODIUM: 140 meq/L (ref 135–145)
Total Bilirubin: 0.6 mg/dL (ref 0.2–1.2)
Total Protein: 7.1 g/dL (ref 6.0–8.3)

## 2017-12-08 LAB — SEDIMENTATION RATE: Sed Rate: 32 mm/hr — ABNORMAL HIGH (ref 0–20)

## 2017-12-08 LAB — POCT INR: INR: 2.1

## 2017-12-08 MED ORDER — PREDNISONE 20 MG PO TABS: ORAL_TABLET | ORAL | 0 refills | Status: DC

## 2017-12-08 MED ORDER — METHYLPREDNISOLONE ACETATE 80 MG/ML IJ SUSP
80.0000 mg | Freq: Once | INTRAMUSCULAR | Status: AC
  Administered 2017-12-08: 80 mg via INTRAMUSCULAR

## 2017-12-08 MED ORDER — ALBUTEROL SULFATE HFA 108 (90 BASE) MCG/ACT IN AERS: 1.0000 | INHALATION_SPRAY | Freq: Four times a day (QID) | RESPIRATORY_TRACT | 99 refills | Status: DC | PRN

## 2017-12-08 MED ORDER — LEVOFLOXACIN 500 MG PO TABS: 500.0000 mg | ORAL_TABLET | Freq: Every day | ORAL | 0 refills | Status: DC

## 2017-12-08 NOTE — Patient Instructions (Signed)
Today we updated your med list in our EPIC system...    Continue your current medications the same...  Maxson, this episode sounds like asthmatic bronchitis >>  We are checking a follow up CXR & some blood work...    We will contact you w/ the results when available...   We are going to initiate therapy with>    An antibiotic - LEVAQUIN 500mg  take one tab daily...    And an anti-inflammatory med - Depo shot today & PREDNISONE 20mg  tabs as follows:  Start in the AM with one tab twice daily for 4 days...  Then decrease to 1 tab each AM for 4 days...  Then decrease to 1/2 tab each AM for 4 days...  Then decrease to 1/2 tab every other day til return visit...  Finally we prescribed an inhaler PROAIR=HFA to use as needed for the wheezing...    Use one inhalation every 4h as needed for the wheezing  Ok to continue the MUCUS RELIEF (Guaifenesin) 400mg  tabs-- take 2 tabs three times daily w/ extra water by mouth  Call for any questions...  Let's plan a follow up visit in 3-4weeks to check your response.Marland KitchenMarland Kitchen

## 2017-12-08 NOTE — Patient Instructions (Signed)
Description   Continue same dosage of coumadin 1/2 tablet daily, skip Fridays dose.  Recheck INR on Monday.  Coumadin clinic # [phone]. Main # [phone].

## 2017-12-09 ENCOUNTER — Encounter: Payer: Self-pay | Admitting: Pulmonary Disease

## 2017-12-09 NOTE — Progress Notes (Signed)
Subjective:     Patient ID: Larry Bautista, male   DOB: Nov 05, 1940, 77 y.o.   MRN: [id number]  HPI 77 y/o WM referred by Dr. Juanetta Bautista, [facility], for a pulmonary evaluation due to intermittent wheezing and an abnormal spirometry test...   ~  I reviewed old EPIC records for pertinent DATA>> ~  CXR 04/05/14 showed norm heart size, tortuous Ao, clear lungs- NAD, DDD in [facility]sp[facility]...  ~  Spirometry 08/06/16 in Larry Bautista's office> FVC=2.20 (57%), FEV1=1.62 (59%), %1sec=74%, mid-flows reduced at 59% predicted; This is c/w a mod restrictive ventilatory defect w/ superimposed small airways dis.  ~  October 01, 2016:  Initial pulmonary consultation by Larry Bautista>      Larry Bautista reports that he has been SOB & wheezing for ~39yr the symptoms are intermittent but seem worse supine per pt; his PCP has treated him for bronchitis w/ antibiotics and Pred w/ improvement but the symptoms return... He notes SOB/ DOE but states it's due to "lack of exercise"- ADLs are OK, stairs are a problem, carrying a load, etc; he says stable & no real change over the last yr... He's also noted a mild dry nagging cough & he is on an ACE for HBP and hx CAD...  Smoking Hx>  he is an ex-smoker, started ~15, smoked regularly 1ppd, transiently up to 2ppd, Quit 1991 at age [age]because "I didn't like being addicted"; total ~35 yrs smoking & ~40 pack-yr history...  Pulmonary Hx>  He denies hx lung problems until the last yr or so; denies hx asthma, pneumonia, TB or exposure, etc...  Medical Hx>  HBP, CAD w/ stent, AFib (followed by Larry Bautista), HL, kidney stones/BPH, DJD/ LBP,   Family Hx>  Lung cancer in mother, heart dis & colon cancer in father, lung cancer & COPD in brother...  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  Current Meds>  No resp meds- on Coumadin, ToprolXL, Lisinopril, Eplerenone, Pravachol, Flomax, Neurontin    Immuniz status>  Epic  records indicate that he needs 2017 Flu vaccine & Pneumovax-23 shot...  EXAM shows Afeb, VSS, O2sat=95% on RA;  HEENT- neg, mallampati2;  Chest- clear x few basilar rhonchi, no consolidation;  Heart- Irreg AFib w/o m/r/g;  Abd- soft, nontender, neg;  Ext- neg w/o c/c/e;  Neuro- intact...   CXR 10/01/16> HE DID NOT GO TO XRAY FOR THE REQUESTED FILM..Marland KitchenMarland Kitchen Ambulatory Oximetry 10/01/16> O2sat=97% on RA at rest w/ pulse=68/min;  He walked 3 laps in the office (185'each) w/ lowest O2sat=94% w/ pulse=98/min...  LABS 10/01/16> HE DID NOT GO TO THE LAB FOR THE REQUESTED BLOOD WORK...  Full PFTs > scheduled and pending IMP  >>        DOE> likely multifactorial w/ components from lung/ heart/ deconditioning...      AbnSpirometry> it appears more restricted than obstructed, need to correlate to current imaging studies...      Ex-smoker> he quit in 1991 at age [age]w/ ~~49pack-yr smoking hx...      Cardiac issues> followed by Larry Bautista-- HBP, CAD- s/p stent, AFib on coumadin...      Medical issues> followed by Larry Bautista-- HBP, HL, kidney stones/ BPH, DJD/ LBP PLAN >>   10/01/16:   We need some further assessment on Larry Bautista's lungs w/ a current CXR, review blood work, and Full PFTs;  He does not desat w/ exercise;  The most important intervention at this point is to increase his exercise  program- eg [facility], silver sneakers, or similar group;  We are holding off on additional meds until we can see this data...   ~  November 06, 2016:  26moROV w/ Larry Bautista>  Larry Bautista that he is about the same, but he is c/o more cough today, worse supine he thinks, does a lot of throat clearing;  He notes occas wheezing & reports that "allergies are really bad this yr" with runny nose & sneezing but "cough is from my meds";  He notes that he's been on the Lisinopril for >556yrnow & we wll check IgE & RASTs...    EXAM shows Afeb, VSS, O2sat=97% on RA;  HEENT- neg, mallampati2;  Chest- clear x min basilar rhonchi, no consolidation;  Heart- Irreg  AFib w/o m/r/g;  Abd- soft, nontender, neg;  Ext- neg w/o c/c/e;  Neuro- intact...   CXR 11/06/16>  Norm heart size, atherosclerotic Ao, sl pleural thickening bilat, min blunting left costophrenic angle...  Full PFTs 11/06/16>  FVC=2.45 (64%), FEV1=1.92 (70%), %1sec=78%; mid-flows are wnl at 85%; post bronchodil there was a sl response (FEV1 incr to 2.12 (77%) for a 10% improvement;  TLC=5.26 (79%), RV=2.63 (105%), RV/TLC=50&;  DLCO=75% predicted  This is c/w mild restriction, no real obstruction BUT a 10% improvement in FEV1 after the bronchodil), air trapping, & mild decr in DLCO noted...  LABS 11/06/16>  Chems- wnl w/ BS=98, Cr=0.80, LFTs wnl;  CBC- wnl w/ Hg=15.3;  TSH=0.47;  Sed=2;  IgE=13;  RAST- NEG x +grasses are borderline IMP >>         DOE> likely multifactorial w/ components from lung/ heart/ deconditioning...      AbnPFTs> it appears more restricted than obstructed, need to correlate to current imaging studies...      Ex-smoker> he quit in 1991 at age [age]/ ~4~13ack-yr smoking hx...      Cardiac issues> followed by Larry Bautista-- HBP, CAD- s/p stent, AFib on coumadin...      Medical issues> followed by Larry Bautista-- HBP, HL, kidney stones/ BPH, DJD/ LBP PLAN >>  10/01/16:   We need some further assessment on Larry Bautista's lungs w/ a current CXR, review blood work, and Full PFTs;  He does not desat w/ exercise;  The most important intervention at this point is to increase his exercise program- eg at the Y, silver sneakers, or similar group;  We are holding off on additional meds until we can see this data...  11/06/16:   Larry Bautista persistent DOE and a mild cough- again these are most likely multifactorial in etiology w/ components from pulm/ cardiac/ deconditioning as noted; he is also on an ACE & although he quit smoking in 1991, he has been way too sedentary w/o exercise program; Labs are all WNL x borderline RAST pos to grasses... REC to STOP Lisinopril, monitor BP at home to see if additional med needed or  not, deep breathing exercises... we plan rov recheck in 60m35mo~  February 09, 2017:  60mo47mo & pulmonary follow up visit>  Larry Bautista stopped the Lisinopril as directed after the last visit and now he indicates that he has a low BP ~80 in the AM, but improved to 110-120 range throughout the day;  He also notes sl chronic cough & throat clearing which could also be related to the ACE inhibitor;  He does note however that his SOB/DOE is sl better since he has started an exercise program- walking/ stairs/ etc...     He  saw DrSharma for Allergy 12/12/16>  Chr rhinorrhea, clear mucus, skin testing was pos for grass & weeds, food testing NEG;  They rec Zyrtek, Flonase, Atrovent nasal...    DOE is sl improved w/ starting an exercise program...    HBP on ToprolXL25, Lisin20 (he never stopped as prev instructed), Eplerenone25; BP= 114/66 & we discussed stopping the ACE inhib rx again, monitor BP at home & call for any questions...    CAD, Afib on Coumadin, ASA81 and followed by PCP-Dr.Don Fisher & CARDS- Dr.HSmith EXAM shows Afeb, VSS, O2sat=97% on RA;  HEENT- neg, mallampati2;  Chest- clear x min basilar rhonchi, no consolidation;  Heart- Irreg AFib w/o m/r/g;  Abd- soft, nontender, neg;  Ext- neg w/o c/c/e;  Neuro- intact... IMP/PLAN>>  Larry Bautista is reminded to stop the Lisinopril completely & monitor his BP & symptoms going forward; hopefully his resp symptoms will resolve off the ACE + continued allergy rx per DrSharma;  He needs to maintain & push his exercise program...  ~  August 10, 2017:  54moROV & JYvesreturns noting that his breathing is stable to sl improved off the ACE inhib & improved exercise program although he is lim by arthritis pain knees & hx LBP w/ surg in past;  He notes mild cough, small amt tan mucus, no hemoptysis; he denies much SOB but notes some DOE w/ stairs etc; no CP/ palpit/ dizzy/ edema- recqll that he has AFib followed by Larry Bautista for Cards... He rests well at night, denies snoring prob,  daytime sleepiness issues, etc...  We reviewed the following interval Epic notes>      He saw Urology-DrEskridge 76/25/18 for f/u BPH>  On Flomax0.4, PVR~100, last PSA was 09/2016= 2.10 (stable);  Rec to try cialis20 prn for ED...    [facility]  He met their goals for rehab & was discharged 03/26/17 after 13 treatments...     He is followed in the Coumadin Clinic regularly> most recently 07/29/17 on Coumadin 541mtabs- taking 1/2 tab daily x7d w/ INR= 2.2, continue same... We reviewed the following medical problems during today's office visit>     DOE> felt to be multifactorial w/ components from lung/ heart/ deconditioning- he notes more limited by athritis in knees & LBP w/ hx surg...    AR> followed by DrSharma (eval 12/2016) & allergic to grasses & weeds on testing; Rx w/ Zyrtek, Flonase, Atrovent nasal; allergy shots were offered but not started.    AbnPFTs> he appears more restricted than obstructed based on his PFT, CXR shows basically clear, some mild incr bilat pleural stripe noted...    Ex-smoker> he quit in 1991 at age 77/ ~4~12ack-yr smoking hx...    Cardiac issues> followed by Larry Bautista-- HBP, CAD- s/p stent, AFib on coumadin...    Medical issues> followed by Larry Bautista-- HBP, HL, kidney stones/ BPH, DJD/ LBP (DrNudelman is weaning his Neurontin); we stopped his ACE rx & he is improved...  EXAM shows Afeb, VSS, O2sat=98% on RA;  HEENT- neg, mallampati2;  Chest- clear x min basilar rhonchi, no consolidation;  Heart- Irreg AFib w/o m/r/g;  Abd- soft, nontender, neg;  Ext- neg w/o c/c/e;  Neuro- intact... IMP/PLAN>>  JeJeanmarcs stable overall, and breathing sl better off ACE & on a mild exercise program; so far he has NOT required breathing meds/ inhalers- improved w/ his allergy meds per DrSharma- Zyrtek10, Flonase/ Atrovent nasal; we will continue to monitor...    ~  December 08, 2017:  70moROV & add-on appt requested for  "bronchitis" noting a 1wk hx cough, tan colored sput, no hemoptysis, plus low grade fever, w/o c/s;  He's noted incr SOB, but no CP/ palpit/ edema... Exam shows insp/exp wheezing & bibasilar rhonchi + chest congestion => c/w asthmatic bronchitic exacerbation... NOTE> [facility] & didn't mention any resp symptoms...     DOE> felt to be multifactorial w/ components from lung/ heart/ deconditioning- he notes more limited by athritis in knees & LBP w/ hx surg...    AR> followed by DrSharma (eval 12/2016) & allergic to grasses & weeds on testing; Rx w/ Zyrtek, Flonase, Atrovent nasal; allergy shots were offered but not started; we did RAST tests 3/18 w/ IgE=13 & RAST pos to grasses only.    AbnPFTs> he appeared more restricted than obstructed based on his PFT, CXR shows basically clear, some mild incr bilat pleural stripe noted...    Ex-smoker> he quit in 1991 at age [age]w/ ~~18pack-yr smoking hx... EXAM shows Afeb, VSS, O2sat=96% on RA;  HEENT- neg, mallampati2;  Chest- insp/exp wheezing & bibasilar rhonchi w/ chest congestion, no consolidation;  Heart- Irreg AFib w/o m/r/g;  Abd- soft, nontender, neg;  Ext- neg w/o c/c/e.   CXR 12/08/17 (independently reviewed by me in the PACS system) showed norm heart size, atherosclerotic changes in Ao, clear lungs w/ bilat pleurl thickening & min scarring left base- NAD...   LABS 12/08/17>  Chems- wnl x AST=40;  CBC- wnl w/ wbc=8.6;  Sed=32 IMP/PLAN>>  JAbdulazizhas acute bronchitis w/ an AB exac=> we discussed Rx w/ Levaquin & a tapering course of PRED; he will also benefit from ProairHFA for prn use & the addition of MCINEX as a mucolytic... We plan an rov recheck in 3-4 weeks to assess the need for any long acting inhalers...    Past Medical History:  Diagnosis Date  . Allergy   . CHF (congestive heart failure) ([place]   . Chronic knee pain   . Coronary artery disease    with LAD DE stent 2004  . Heel  spur, left   . Hyperlipidemia   . Hypertension   . Kidney stones   . Lower back pain    status post surgery for spondylolisthesis  . Migraine   . Persistent atrial fibrillation ([place]    longstanding persistent (since 05/2008)  . Plantar fasciitis     Past Surgical History:  Procedure Laterality Date  . BACK SURGERY     spondylolisthesis  . CARDIAC CATHETERIZATION  03/06/2003  . CORONARY ANGIOPLASTY WITH STENT PLACEMENT  04/2003   CYPHER stent implantation in the LAD  . HAND SURGERY Right 2014   carpal tunnel  . SKIN CANCER EXCISION  1980's   Face  . VASCULAR SURGERY     Vascular Stent    Outpatient Encounter Medications as of 12/08/2017  Medication Sig  . aspirin 81 MG tablet Take 81 mg by mouth daily.  . cetirizine (ZYRTEC) 10 MG tablet Take 10 mg by mouth daily.  . diphenhydramine-acetaminophen (TYLENOL PM) 25-500 MG TABS Take 1 tablet by mouth at bedtime as needed.  . fish oil-omega-3 fatty acids 1000 MG capsule Take 2 capsules by mouth daily.   .Marland Kitchengabapentin (NEURONTIN) 300 MG capsule Take 300 mg by mouth at bedtime.   . meclizine (ANTIVERT) 12.5 MG tablet Take 12.5 mg by mouth 3 (three) times daily as needed for dizziness.  .[name]  multivitamin (THERAGRAN) per tablet Take 1 tablet by mouth daily.  . nitroGLYCERIN (NITROSTAT) 0.4 MG SL tablet Place 0.4 mg under the tongue every 5 (five) minutes as needed.  . psyllium (METAMUCIL) 58.6 % powder Take 1 packet by mouth daily.   . tamsulosin (FLOMAX) 0.4 MG CAPS capsule Take 0.4 mg by mouth daily after supper.  . vitamin C (ASCORBIC ACID) 500 MG tablet Take 500 mg by mouth daily.  . [DISCONTINUED] eplerenone (INSPRA) 25 MG tablet Take 1 tablet (25 mg total) by mouth daily. Please call [phone] to schedule annual thanks.  . [DISCONTINUED] fluticasone (FLONASE) 50 MCG/ACT nasal spray Place 2 sprays into both nostrils daily.  . [DISCONTINUED] metoprolol succinate (TOPROL-XL) 25 MG 24 hr tablet Take 1 tablet (25 mg total) by mouth  daily.  . [DISCONTINUED] pravastatin (PRAVACHOL) 80 MG tablet Take 1 tablet (80 mg total) by mouth daily.  . [DISCONTINUED] warfarin (COUMADIN) 5 MG tablet TAKE ONE TABLET DAILY AS DIRECTED  . albuterol (PROAIR HFA) 108 (90 Base) MCG/ACT inhaler Inhale 1-2 puffs into the lungs every 6 (six) hours as needed for wheezing or shortness of breath.  . levofloxacin (LEVAQUIN) 500 MG tablet Take 1 tablet (500 mg total) by mouth daily.  . predniSONE (DELTASONE) 20 MG tablet Take as directed   No facility-administered encounter medications on file as of 12/08/2017.     Allergies  Allergen Reactions  . Simvastatin Other (See Comments)    Leg pain  . Lisinopril Cough  . Celecoxib Rash    Immunization History  Administered Date(s) Administered  . Influenza, High Dose Seasonal PF 05/01/2016, 04/08/2017  . Influenza-Unspecified 06/04/2015  . Pneumococcal Conjugate-13 03/28/2015  . Tdap 05/17/2012  . Zoster 11/04/2005    Current Medications, Allergies, Past Medical History, Past Surgical History, Family History, and Social History were reviewed in [employer] record.   Review of Systems             All symptoms NEG except where BOLDED >>  Constitutional:  F/C/S, fatigue, anorexia, unexpected weight change. HEENT:  HA, visual changes, hearing loss, earache, nasal symptoms, sore throat, mouth sores, hoarseness. Resp:  cough, sputum, hemoptysis; SOB, tightness, wheezing. Cardio:  CP, palpit, DOE, orthopnea, edema. GI:  N/V/D/C, blood in stool; reflux, abd pain, distention, gas. GU:  dysuria, freq, urgency, hematuria, flank pain, voiding difficulty. MS:  joint pain, swelling, tenderness, decr ROM; neck pain, back pain, etc. Neuro:  HA, tremors, seizures, dizziness, syncope, weakness, numbness, gait abn. Skin:  suspicious lesions or skin rash. Heme:  adenopathy, bruising, bleeding. Psyche:  confusion, agitation, sleep disturbance, hallucinations, anxiety, depression  suicidal.   Objective:   Physical Exam         Vital Signs:  Reviewed...   General:  WD, WN, 77 y/o WM in NAD; alert & oriented; pleasant & cooperative... HEENT:  Larry Bautista/AT; Conjunctiva- pink, Sclera- nonicteric, EOM-wnl, PERRLA, EACs-clear, TMs-wnl; NOSE-clear; THROAT-clear & wnl.  Neck:  Supple w/ fair ROM; no JVD; normal carotid impulses w/o bruits; no thyromegaly or nodules palpated; no lymphadenopathy.  Chest:  Clear to P & A; without wheezes, rales, or rhonchi heard. Heart:  Irregular rhythm (AFIB); norm S1 & S2 without murmurs, rubs, or gallops detected. Abdomen:  Soft & nontender- no guarding or rebound; normal bowel sounds; no organomegaly or masses palpated. Ext:  Decr ROM; without deformities +arthritic changes; no varicose veins, +venous insuffic, tr edema;  Pulses intact w/o bruits. Neuro:  CNs II-XII intact; motor testing normal; sensory testing normal; gait  normal & balance OK. Derm:  No lesions noted; no rash etc. Lymph:  No cervical, supraclavicular, axillary, or inguinal adenopathy palpated.   Assessment:      IMP>>        DOE> likely multifactorial w/ components from lung/ heart/ deconditioning...      AbnSpirometry> it appears more restricted than obstructed, need to correlate to current imaging studies...      Ex-smoker> he quit in 1991 at age 59 w/ ~54 pack-yr smoking hx...      Cardiac issues> followed by Larry Bautista-- HBP, CAD- s/p stent, AFib on coumadin...      Medical issues> followed by Larry Bautista-- HBP, HL, kidney stones/ BPH, DJD/ LBP  PLAN>>       We need some further assessment on Larry Bautista's lungs w/ a current CXR, review blood work, and Full PFTs;  He does not desat w/ exercise;  The most important intervention at this point is to increase his exercise program- eg [facility], silver sneakers, or similar group;  We are holding off on additional meds until we can see this data...  11/06/16>   Larry Bautista has persistent DOE and a mild cough- again these are most likely  multifactorial in etiology w/ components from pulm/ cardiac/ deconditioning as noted; he is also on an ACE & although he quit smoking in 1991, he has been way too sedentary w/o exercise program; Labs are all WNL x borderline RAST pos to grasses... REC to STOP Lisinopril, monitor BP at home to see if additional med needed or not, deep breathing exercises... we plan rov recheck in 23mo 02/09/17>   JGeois reminded to stop the Lisinopril completely & monitor his BP & symptoms going forward; hopefully his resp symptoms will resolve off the ACE + continued allergy rx per DrSharma;  He needs to maintain & push his exercise program. 12/18/17>   JPhuhas acute bronchitis w/ an AB exac=> we discussed Rx w/ Levaquin & a tapering course of PRED; he will also benefit from ProairHFA for prn use & the addition of MCINEX as a mucolytic... We plan an rov recheck in 3-4 weeks to assess the need for any long acting inhalers.   Plan:     Patient's Medications  New Prescriptions   ALBUTEROL (PROAIR HFA) 108 (90 BASE) MCG/ACT INHALER    Inhale 1-2 puffs into the lungs every 6 (six) hours as needed for wheezing or shortness of breath.   LEVOFLOXACIN (LEVAQUIN) 500 MG TABLET    Take 1 tablet (500 mg total) by mouth daily.   PREDNISONE (DELTASONE) 20 MG TABLET    Take as directed  Previous Medications   ASPIRIN 81 MG TABLET    Take 81 mg by mouth daily.   CETIRIZINE (ZYRTEC) 10 MG TABLET    Take 10 mg by mouth daily.   DIPHENHYDRAMINE-ACETAMINOPHEN (TYLENOL PM) 25-500 MG TABS    Take 1 tablet by mouth at bedtime as needed.   FISH OIL-OMEGA-3 FATTY ACIDS 1000 MG CAPSULE    Take 2 capsules by mouth daily.    GABAPENTIN (NEURONTIN) 300 MG CAPSULE    Take 300 mg by mouth at bedtime.    MECLIZINE (ANTIVERT) 12.5 MG TABLET    Take 12.5 mg by mouth 3 (three) times daily as needed for dizziness.   MULTIVITAMIN (THERAGRAN) PER TABLET    Take 1 tablet by mouth daily.   NITROGLYCERIN (NITROSTAT) 0.4 MG SL TABLET    Place 0.4 mg  under the tongue every 5 (five) minutes  as needed.   PSYLLIUM (METAMUCIL) 58.6 % POWDER    Take 1 packet by mouth daily.    TAMSULOSIN (FLOMAX) 0.4 MG CAPS CAPSULE    Take 0.4 mg by mouth daily after supper.   VITAMIN C (ASCORBIC ACID) 500 MG TABLET    Take 500 mg by mouth daily.  Modified Medications   Modified Medication Previous Medication   EPLERENONE (INSPRA) 25 MG TABLET eplerenone (INSPRA) 25 MG tablet      Take 1 tablet (25 mg total) by mouth daily. Please keep 5/31 appointment for additional refills thanks.    Take 1 tablet (25 mg total) by mouth daily. Please call [phone] to schedule annual thanks.   FLUTICASONE (FLONASE) 50 MCG/ACT NASAL SPRAY fluticasone (FLONASE) 50 MCG/ACT nasal spray      USE 2 SPRAYS IN EACH NOSTRIL DAILY    Place 2 sprays into both nostrils daily.   METOPROLOL SUCCINATE (TOPROL-XL) 25 MG 24 HR TABLET metoprolol succinate (TOPROL-XL) 25 MG 24 hr tablet      Take 1 tablet (25 mg total) by mouth daily. Please keep 5/31 appointment for additional refills thanks.    Take 1 tablet (25 mg total) by mouth daily.   PRAVASTATIN (PRAVACHOL) 80 MG TABLET pravastatin (PRAVACHOL) 80 MG tablet      Take 1 tablet (80 mg total) by mouth daily. Please keep 5/31 appointment for additional refills thanks.    Take 1 tablet (80 mg total) by mouth daily.   WARFARIN (COUMADIN) 5 MG TABLET warfarin (COUMADIN) 5 MG tablet      TAKE ONE TABLET DAILY AS DIRECTED    TAKE ONE TABLET DAILY AS DIRECTED  Discontinued Medications   No medications on file

## 2017-12-11 DIAGNOSIS — R739 Hyperglycemia, unspecified: Secondary | ICD-10-CM | POA: Diagnosis not present

## 2017-12-11 DIAGNOSIS — E782 Mixed hyperlipidemia: Secondary | ICD-10-CM | POA: Diagnosis not present

## 2017-12-12 LAB — COMPREHENSIVE METABOLIC PANEL
A/G RATIO: 1.8 (ref 1.2–2.2)
ALBUMIN: 4.6 g/dL (ref 3.5–4.8)
ALK PHOS: 61 IU/L (ref 39–117)
ALT: 38 IU/L (ref 0–44)
AST: 38 IU/L (ref 0–40)
BUN / CREAT RATIO: 20 (ref 10–24)
BUN: 18 mg/dL (ref 8–27)
Bilirubin Total: 0.7 mg/dL (ref 0.0–1.2)
CO2: 23 mmol/L (ref 20–29)
Calcium: 10.2 mg/dL (ref 8.6–10.2)
Chloride: 102 mmol/L (ref 96–106)
Creatinine, Ser: 0.91 mg/dL (ref 0.76–1.27)
GFR calc Af Amer: 93 mL/min/{1.73_m2} (ref >59)
GFR, EST NON AFRICAN AMERICAN: 80 mL/min/{1.73_m2} (ref >59)
GLOBULIN, TOTAL: 2.5 g/dL (ref 1.5–4.5)
Glucose: 106 mg/dL — ABNORMAL HIGH (ref 65–99)
POTASSIUM: 4.4 mmol/L (ref 3.5–5.2)
SODIUM: 142 mmol/L (ref 134–144)
Total Protein: 7.1 g/dL (ref 6.0–8.5)

## 2017-12-12 LAB — LIPID PANEL
CHOL/HDL RATIO: 3.1 ratio (ref 0.0–5.0)
CHOLESTEROL TOTAL: 177 mg/dL (ref 100–199)
HDL: 57 mg/dL (ref >39)
LDL Calculated: 98 mg/dL (ref 0–99)
TRIGLYCERIDES: 112 mg/dL (ref 0–149)
VLDL Cholesterol Cal: 22 mg/dL (ref 5–40)

## 2017-12-12 LAB — HEMOGLOBIN A1C
Est. average glucose Bld gHb Est-mCnc: 126 mg/dL
HEMOGLOBIN A1C: 6 % — AB (ref 4.8–5.6)

## 2017-12-14 ENCOUNTER — Other Ambulatory Visit: Payer: Self-pay | Admitting: Interventional Cardiology

## 2017-12-14 ENCOUNTER — Other Ambulatory Visit: Payer: Self-pay | Admitting: Family Medicine

## 2017-12-14 ENCOUNTER — Ambulatory Visit (INDEPENDENT_AMBULATORY_CARE_PROVIDER_SITE_OTHER): Payer: Medicare Other | Admitting: *Deleted

## 2017-12-14 DIAGNOSIS — Z5181 Encounter for therapeutic drug level monitoring: Secondary | ICD-10-CM

## 2017-12-14 DIAGNOSIS — I480 Paroxysmal atrial fibrillation: Secondary | ICD-10-CM

## 2017-12-14 DIAGNOSIS — I4891 Unspecified atrial fibrillation: Secondary | ICD-10-CM

## 2017-12-14 LAB — POCT INR: INR: 2.4

## 2017-12-14 NOTE — Patient Instructions (Signed)
Description   Continue same dosage of coumadin 1/2 tablet daily. Recheck INR in 4 weeks. Coumadin clinic # [phone]. Main # [phone].    

## 2017-12-15 ENCOUNTER — Other Ambulatory Visit: Payer: Self-pay | Admitting: Interventional Cardiology

## 2017-12-29 ENCOUNTER — Encounter: Payer: Self-pay | Admitting: Pulmonary Disease

## 2017-12-29 ENCOUNTER — Ambulatory Visit (INDEPENDENT_AMBULATORY_CARE_PROVIDER_SITE_OTHER): Payer: Medicare Other | Admitting: Pulmonary Disease

## 2017-12-29 VITALS — BP 132/64 | HR 58 | Temp 98.1°F | Ht 68.0 in | Wt 188.6 lb

## 2017-12-29 DIAGNOSIS — J4520 Mild intermittent asthma, uncomplicated: Secondary | ICD-10-CM

## 2017-12-29 DIAGNOSIS — I10 Essential (primary) hypertension: Secondary | ICD-10-CM

## 2017-12-29 DIAGNOSIS — Z87891 Personal history of nicotine dependence: Secondary | ICD-10-CM

## 2017-12-29 DIAGNOSIS — I480 Paroxysmal atrial fibrillation: Secondary | ICD-10-CM

## 2017-12-29 DIAGNOSIS — J301 Allergic rhinitis due to pollen: Secondary | ICD-10-CM | POA: Diagnosis not present

## 2017-12-29 DIAGNOSIS — R942 Abnormal results of pulmonary function studies: Secondary | ICD-10-CM | POA: Diagnosis not present

## 2017-12-29 DIAGNOSIS — R0609 Other forms of dyspnea: Secondary | ICD-10-CM

## 2017-12-29 NOTE — Patient Instructions (Signed)
Today we updated your med list in our EPIC system...    Continue your current medications the same...  Use your Albuterol-HFA rescue inhaler as needed for wheezing, congestion, shortness of breath...  Finish out the Prednisone taking 1/2 tab every other day til gone...  Stay as active as possible w/ a regular exercise program...  Call for any questions...  Let's plan a follow up visit in 3-64months, sooner if needed for problems.Marland KitchenMarland Kitchen

## 2017-12-29 NOTE — Progress Notes (Signed)
Subjective:     Patient ID: Larry Bautista, male   DOB: 26-Dec-1940, 77 y.o.   MRN: [id number]  HPI 77 y/o WM referred by Dr. Juanetta Beets, [facility], for a pulmonary evaluation due to intermittent wheezing and an abnormal spirometry test...   ~  I reviewed old EPIC records for pertinent DATA>> ~  CXR 04/05/14 showed norm heart size, tortuous Ao, clear lungs- NAD, DDD in Tspine...  ~  Spirometry 08/06/16 in DrFisher's office> FVC=2.20 (57%), FEV1=1.62 (59%), %1sec=74%, mid-flows reduced at 59% predicted; This is c/w a mod restrictive ventilatory defect w/ superimposed small airways dis.  ~  October 01, 2016:  Initial pulmonary consultation by SN>      Larry Bautista reports that he has been SOB & wheezing for ~81yr the symptoms are intermittent but seem worse supine per pt; his PCP has treated him for bronchitis w/ antibiotics and Pred w/ improvement but the symptoms return... He notes SOB/ DOE but states it's due to "lack of exercise"- ADLs are OK, stairs are a problem, carrying a load, etc; he says stable & no real change over the last yr... He's also noted a mild dry nagging cough & he is on an ACE for HBP and hx CAD...  Smoking Hx>  he is an ex-smoker, started ~15, smoked regularly 1ppd, transiently up to 2ppd, Quit 1991 at age [age]because "I didn't like being addicted"; total ~35 yrs smoking & ~40 pack-yr history...  Pulmonary Hx>  He denies hx lung problems until the last yr or so; denies hx asthma, pneumonia, TB or exposure, etc...  Medical Hx>  HBP, CAD w/ stent, AFib (followed by DrHSmith), HL, kidney stones/BPH, DJD/ LBP,   Family Hx>  Lung cancer in mother, heart dis & colon cancer in father, lung cancer & COPD in brother...  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  Current Meds>  No resp meds- on Coumadin, ToprolXL, Lisinopril, Eplerenone, Pravachol, Flomax, Neurontin    Immuniz status>  Epic  records indicate that he needs 2017 Flu vaccine & Pneumovax-23 shot...  EXAM shows Afeb, VSS, O2sat=95% on RA;  HEENT- neg, mallampati2;  Chest- clear x few basilar rhonchi, no consolidation;  Heart- Irreg AFib w/o m/r/g;  Abd- soft, nontender, neg;  Ext- neg w/o c/c/e;  Neuro- intact...   CXR 10/01/16> HE DID NOT GO TO XRAY FOR THE REQUESTED FILM..Marland KitchenMarland Kitchen Ambulatory Oximetry 10/01/16> O2sat=97% on RA at rest w/ pulse=68/min;  He walked 3 laps in the office (185'each) w/ lowest O2sat=94% w/ pulse=98/min...  LABS 10/01/16> HE DID NOT GO TO THE LAB FOR THE REQUESTED BLOOD WORK...  Full PFTs > scheduled and pending IMP  >>        DOE> likely multifactorial w/ components from lung/ heart/ deconditioning...      AbnSpirometry> it appears more restricted than obstructed, need to correlate to current imaging studies...      Ex-smoker> he quit in 1991 at age [age]w/ ~~34pack-yr smoking hx...      Cardiac issues> followed by DrHSmith-- HBP, CAD- s/p stent, AFib on coumadin...      Medical issues> followed by DrFisher-- HBP, HL, kidney stones/ BPH, DJD/ LBP PLAN >>   10/01/16:   We need some further assessment on [name]'s lungs w/ a current CXR, review blood work, and Full PFTs;  He does not desat w/ exercise;  The most important intervention at this point is to increase  his exercise program- [facility], silver sneakers, or similar group;  We are holding off on additional meds until we can see this data...   ~  November 06, 2016:  29moROV w/ SN>  [name] that he is about the same, but he is c/o more cough today, worse supine he thinks, does a lot of throat clearing;  He notes occas wheezing & reports that "allergies are really bad this yr" with runny nose & sneezing but "cough is from my meds";  He notes that he's been on the Lisinopril for >53yrnow & we wll check IgE & RASTs...    EXAM shows Afeb, VSS, O2sat=97% on RA;  HEENT- neg, mallampati2;  Chest- clear x min basilar rhonchi, no consolidation;  Heart- Irreg  AFib w/o m/r/g;  Abd- soft, nontender, neg;  Ext- neg w/o c/c/e;  Neuro- intact...   CXR 11/06/16>  Norm heart size, atherosclerotic Ao, sl pleural thickening bilat, min blunting left costophrenic angle...  Full PFTs 11/06/16>  FVC=2.45 (64%), FEV1=1.92 (70%), %1sec=78%; mid-flows are wnl at 85%; post bronchodil there was a sl response (FEV1 incr to 2.12 (77%) for a 10% improvement;  TLC=5.26 (79%), RV=2.63 (105%), RV/TLC=50&;  DLCO=75% predicted  This is c/w mild restriction, no real obstruction BUT a 10% improvement in FEV1 after the bronchodil), air trapping, & mild decr in DLCO noted...  LABS 11/06/16>  Chems- wnl w/ BS=98, Cr=0.80, LFTs wnl;  CBC- wnl w/ Hg=15.3;  TSH=0.47;  Sed=2;  IgE=13;  RAST- NEG x +grasses are borderline IMP >>         DOE> likely multifactorial w/ components from lung/ heart/ deconditioning...      AbnPFTs> it appears more restricted than obstructed, need to correlate to current imaging studies...      Ex-smoker> he quit in 1991 at age 77/ ~4~50ack-yr smoking hx...      Cardiac issues> followed by DrHSmith-- HBP, CAD- s/p stent, AFib on coumadin...      Medical issues> followed by DrFisher-- HBP, HL, kidney stones/ BPH, DJD/ LBP PLAN >>  10/01/16:   We need some further assessment on [name]'s lungs w/ a current CXR, review blood work, and Full PFTs;  He does not desat w/ exercise;  The most important intervention at this point is to increase his exercise program- eg at the Y, silver sneakers, or similar group;  We are holding off on additional meds until we can see this data...  11/06/16:   Larry Bautista persistent DOE and a mild cough- again these are most likely multifactorial in etiology w/ components from pulm/ cardiac/ deconditioning as noted; he is also on an ACE & although he quit smoking in 1991, he has been way too sedentary w/o exercise program; Labs are all WNL x borderline RAST pos to grasses... REC to STOP Lisinopril, monitor BP at home to see if additional med needed or  not, deep breathing exercises... we plan rov recheck in 104m38mo~  February 09, 2017:  104mo36mo & pulmonary follow up visit>  Larry Bautista stopped the Lisinopril as directed after the last visit and now he indicates that he has a low BP ~80 in the AM, but improved to 110-120 range throughout the day;  He also notes sl chronic cough & throat clearing which could also be related to the ACE inhibitor;  He does note however that his SOB/DOE is sl better since he has started an exercise program- walking/ stairs/ etc...[name]  He saw DrSharma for Allergy 12/12/16>  Chr rhinorrhea, clear mucus, skin testing was pos for grass & weeds, food testing NEG;  They rec Zyrtek, Flonase, Atrovent nasal...    DOE is sl improved w/ starting an exercise program...    HBP on ToprolXL25, Lisin20 (he never stopped as prev instructed), Eplerenone25; BP= 114/66 & we discussed stopping the ACE inhib rx again, monitor BP at home & call for any questions...    CAD, Afib on Coumadin, ASA81 and followed by PCP-Dr.Don Fisher & CARDS- Dr.HSmith EXAM shows Afeb, VSS, O2sat=97% on RA;  HEENT- neg, mallampati2;  Chest- clear x min basilar rhonchi, no consolidation;  Heart- Irreg AFib w/o m/r/g;  Abd- soft, nontender, neg;  Ext- neg w/o c/c/e;  Neuro- intact... IMP/PLAN>>  Taytum is reminded to stop the Lisinopril completely & monitor his BP & symptoms going forward; hopefully his resp symptoms will resolve off the ACE + continued allergy rx per DrSharma;  He needs to maintain & push his exercise program...  ~  August 10, 2017:  53moROV & JGodricreturns noting that his breathing is stable to sl improved off the ACE inhib & improved exercise program although he is lim by arthritis pain knees & hx LBP w/ surg in past;  He notes mild cough, small amt tan mucus, no hemoptysis; he denies much SOB but notes some DOE w/ stairs etc; no CP/ palpit/ dizzy/ edema- recqll that he has AFib followed by DrHSmith for Cards... He rests well at night, denies snoring prob,  daytime sleepiness issues, etc...  We reviewed the following interval Epic notes>      He saw Urology-DrEskridge 76/25/18 for f/u BPH>  On Flomax0.4, PVR~100, last PSA was 09/2016= 2.10 (stable);  Rec to try cialis20 prn for ED...    [facility]  He met their goals for rehab & was discharged 03/26/17 after 13 treatments...     He is followed in the [facility] regularly> most recently 07/29/17 on Coumadin 535mtabs- taking 1/2 tab daily x7d w/ INR= 2.2, continue same... We reviewed the following medical problems during today's office visit>     DOE> felt to be multifactorial w/ components from lung/ heart/ deconditioning- he notes more limited by athritis in knees & LBP w/ hx surg...    AR> followed by DrSharma (eval 12/2016) & allergic to grasses & weeds on testing; Rx w/ Zyrtek, Flonase, Atrovent nasal; allergy shots were offered but not started.    AbnPFTs> he appears more restricted than obstructed based on his PFT, CXR shows basically clear, some mild incr bilat pleural stripe noted...    Ex-smoker> he quit in 1991 at age 77/ ~4~59ack-yr smoking hx...    Cardiac issues> followed by DrHSmith-- HBP, CAD- s/p stent, AFib on coumadin...    Medical issues> followed by DrFisher-- HBP, HL, kidney stones/ BPH, DJD/ LBP (DrNudelman is weaning his Neurontin); we stopped his ACE rx & he is improved...  EXAM shows Afeb, VSS, O2sat=98% on RA;  HEENT- neg, mallampati2;  Chest- clear x min basilar rhonchi, no consolidation;  Heart- Irreg AFib w/o m/r/g;  Abd- soft, nontender, neg;  Ext- neg w/o c/c/e;  Neuro- intact... IMP/PLAN>>  JeMarlyns stable overall, and breathing sl better off ACE & on a mild exercise program; so far he has NOT required breathing meds/ inhalers- improved w/ his allergy meds per DrSharma- Zyrtek10, Flonase/ Atrovent nasal; we will continue to monitor...   ~  December 08, 2017:  32moROV & add-on appt requested for  "bronchitis" noting a 1wk hx cough, tan colored sput, no hemoptysis, plus low grade fever, w/o c/s;  He's noted incr SOB, but no CP/ palpit/ edema... Exam shows insp/exp wheezing & bibasilar rhonchi + chest congestion => c/w asthmatic bronchitic exacerbation... NOTE> [facility] & didn't mention any resp symptoms...     DOE> felt to be multifactorial w/ components from lung/ heart/ deconditioning- he notes more limited by athritis in knees & LBP w/ hx surg...    AR> followed by DrSharma (eval 12/2016) & allergic to grasses & weeds on testing; Rx w/ Zyrtek, Flonase, Atrovent nasal; allergy shots were offered but not started; we did RAST tests 3/18 w/ IgE=13 & RAST pos to grasses only.    AbnPFTs> he appeared more restricted than obstructed based on his PFT, CXR shows basically clear, some mild incr bilat pleural stripe noted...    Ex-smoker> he quit in 1991 at age 89w/ ~~85pack-yr smoking hx... EXAM shows Afeb, VSS, O2sat=96% on RA;  HEENT- neg, mallampati2;  Chest- insp/exp wheezing & bibasilar rhonchi w/ chest congestion, no consolidation;  Heart- Irreg AFib w/o m/r/g;  Abd- soft, nontender, neg;  Ext- neg w/o c/c/e.   CXR 12/08/17 (independently reviewed by me in the PACS system) showed norm heart size, atherosclerotic changes in Ao, clear lungs w/ bilat pleurl thickening & min scarring left base- NAD...   LABS 12/08/17>  Chems- wnl x AST=40;  CBC- wnl w/ wbc=8.6;  Sed=32 IMP/PLAN>>  JJaneshas acute bronchitis w/ an AB exac=> we discussed Rx w/ Levaquin & a tapering course of PRED; he will also benefit from ProairHFA for prn use & the addition of MCINEX as a mucolytic... We plan an rov recheck in 3-4 weeks to assess the need for any long acting inhalers...   ~  December 29, 2017:  3wk ROV & pulm recheck>  When seen 4/2 Larry Bautista an acute AB exac precipitated by a bronchitic infection w/ wheezing, rhonchi, chest congestion;  His CXR showed  chr changes but NAD & his Labs were ok x Sed=32;  We treated him w/ Levaquin, Pred course, Mucinex & fluids;  Asked to ret to assess response & decide whether ICS/LABA inhaler indicated...  He reports a great response to out therapy, estimates 90+% better now w/ min wheezing in the early AM if he lies on his left side, but otherw clear, no dyspnea, cough, sput, etc... He has finished the Levaquin, weaned the Pred to '10mg'$  Qod at this time & doesn't feel he needs the Mucinex anymore;  He has Albuterol-HFA for prn use & doesn't want any additional inhalers unless absolutely needed...     EXAM shows Afeb, VSS, O2sat=97% on RA;  HEENT- neg, mallampati2;  Chest- clear now w/o w/r/r;  Heart- Irreg AFib w/o m/r/g;  Abd- soft, nontender, neg;  Ext- neg w/o c/c/e.  IMP/PLAN>>  Larry Bautista an ex-smoker w/ AB, AR, multifactorial dyspnea;  Asked to finish the Pred'10mg'$  Qod til gone, & use the albutHFA inhaler as needed; we reviewed need for regular exercise, we plan rov recheck in 3-431mo.     Past Medical History:  Diagnosis Date  . Allergy   . [place]  . Chronic [place] pain   . Coronary artery disease    with LAD DE stent 2004  . Heel spur, left   .  Hyperlipidemia   . Hypertension   . Kidney stones   . Lower back pain    status post surgery for spondylolisthesis  . Migraine   . Persistent atrial fibrillation ([place])    longstanding persistent (since 05/2008)  . Plantar fasciitis     Past Surgical History:  Procedure Laterality Date  . BACK SURGERY     spondylolisthesis  . CARDIAC CATHETERIZATION  03/06/2003  . CORONARY ANGIOPLASTY WITH STENT PLACEMENT  04/2003   CYPHER stent implantation in the LAD  . HAND SURGERY Right 2014   carpal tunnel  . SKIN CANCER EXCISION  1980's   Face  . VASCULAR SURGERY     Vascular Stent    Outpatient Encounter Medications as of 12/29/2017  Medication Sig  . albuterol (PROAIR HFA) 108 (90 Base) MCG/ACT inhaler Inhale 1-2 puffs into the  lungs every 6 (six) hours as needed for wheezing or shortness of breath.  [name] aspirin 81 MG tablet Take 81 mg by mouth daily.  . cetirizine (ZYRTEC) 10 MG tablet Take 10 mg by mouth daily.  . diphenhydramine-acetaminophen (TYLENOL PM) 25-500 MG TABS Take 1 tablet by mouth at bedtime as needed.  [name] eplerenone (INSPRA) 25 MG tablet Take 1 tablet (25 mg total) by mouth daily. Please keep 5/31 appointment for additional refills thanks.  . fish oil-omega-3 fatty acids 1000 MG capsule Take 2 capsules by mouth daily.   . fluticasone (FLONASE) 50 MCG/ACT nasal spray USE 2 SPRAYS IN EACH NOSTRIL DAILY  . gabapentin (NEURONTIN) 300 MG capsule Take 300 mg by mouth at bedtime. Every other day  . meclizine (ANTIVERT) 12.5 MG tablet Take 12.5 mg by mouth 3 (three) times daily as needed for dizziness.  . metoprolol succinate (TOPROL-XL) 25 MG 24 hr tablet Take 1 tablet (25 mg total) by mouth daily. Please keep 5/31 appointment for additional refills thanks.  . multivitamin (THERAGRAN) per tablet Take 1 tablet by mouth daily.  . nitroGLYCERIN (NITROSTAT) 0.4 MG SL tablet Place 0.4 mg under the tongue every 5 (five) minutes as needed.  . pravastatin (PRAVACHOL) 80 MG tablet Take 1 tablet (80 mg total) by mouth daily. Please keep 5/31 appointment for additional refills thanks.  . predniSONE (DELTASONE) 20 MG tablet Take as directed  . psyllium (METAMUCIL) 58.6 % powder Take 1 packet by mouth daily.   . tamsulosin (FLOMAX) 0.4 MG CAPS capsule Take 0.4 mg by mouth daily after supper.  . vitamin C (ASCORBIC ACID) 500 MG tablet Take 500 mg by mouth daily.  [name] warfarin (COUMADIN) 5 MG tablet TAKE ONE TABLET DAILY AS DIRECTED  . [DISCONTINUED] levofloxacin (LEVAQUIN) 500 MG tablet Take 1 tablet (500 mg total) by mouth daily.   No facility-administered encounter medications on file as of 12/29/2017.     Allergies  Allergen Reactions  . Simvastatin Other (See Comments)    Leg pain  . Lisinopril Cough  . Celecoxib Rash      Immunization History  Administered Date(s) Administered  . Influenza, High Dose Seasonal PF 05/01/2016, 04/08/2017  . Influenza-Unspecified 06/04/2015  . Pneumococcal Conjugate-13 03/28/2015  . Tdap 05/17/2012  . Zoster 11/04/2005    Current Medications, Allergies, Past Medical History, Past Surgical History, Family History, and Social History were reviewed in [employer] record.   Review of Systems             All symptoms NEG except where BOLDED >>  Constitutional:  F/C/S, fatigue, anorexia, unexpected weight change. HEENT:  HA, visual changes, hearing  loss, earache, nasal symptoms, sore throat, mouth sores, hoarseness. Resp:  cough, sputum, hemoptysis; SOB, tightness, wheezing. Cardio:  CP, palpit, DOE, orthopnea, edema. GI:  N/V/D/C, blood in stool; reflux, abd pain, distention, gas. GU:  dysuria, freq, urgency, hematuria, flank pain, voiding difficulty. MS:  joint pain, swelling, tenderness, decr ROM; neck pain, back pain, etc. Neuro:  HA, tremors, seizures, dizziness, syncope, weakness, numbness, gait abn. Skin:  suspicious lesions or skin rash. Heme:  adenopathy, bruising, bleeding. Psyche:  confusion, agitation, sleep disturbance, hallucinations, anxiety, depression suicidal.   Objective:   Physical Exam         Vital Signs:  Reviewed...   General:  WD, WN, 77 y/o WM in NAD; alert & oriented; pleasant & cooperative... HEENT:  [place]/AT; Conjunctiva- pink, Sclera- nonicteric, EOM-wnl, PERRLA, EACs-clear, TMs-wnl; NOSE-clear; THROAT-clear & wnl.  Neck:  Supple w/ fair ROM; no JVD; normal carotid impulses w/o bruits; no thyromegaly or nodules palpated; no lymphadenopathy.  Chest:  Clear to P & A; without wheezes, rales, or rhonchi heard. Heart:  Irregular rhythm (AFIB); norm S1 & S2 without murmurs, rubs, or gallops detected. Abdomen:  Soft & nontender- no guarding or rebound; normal bowel sounds; no organomegaly or masses palpated. Ext:  Decr ROM;  without deformities +arthritic changes; no varicose veins, +venous insuffic, tr edema;  Pulses intact w/o bruits. Neuro:  CNs II-XII intact; motor testing normal; sensory testing normal; gait normal & balance OK. Derm:  No lesions noted; no rash etc. Lymph:  No cervical, supraclavicular, axillary, or inguinal adenopathy palpated.   Assessment:      IMP>>        DOE> likely multifactorial w/ components from lung/ heart/ deconditioning...      AR, AB> stable on AlbutHFA as needed + Flonase/ Zyrtek...       AbnSpirometry> it appears more restricted than obstructed, need to correlate to current imaging studies...      Ex-smoker> he quit in 1991 at age 65 w/ ~71 pack-yr smoking hx...      Cardiac issues> followed by DrHSmith-- HBP, CAD- s/p stent, AFib on coumadin...      Medical issues> followed by DrFisher-- HBP, HL, kidney stones/ BPH, DJD/ LBP  PLAN>>       We need some further assessment on Larry Bautista's lungs w/ a current CXR, review blood work, and Full PFTs;  He does not desat w/ exercise;  The most important intervention at this point is to increase his exercise program- [facility], silver sneakers, or similar group;  We are holding off on additional meds until we can see this data...  11/06/16>   Larry Bautista has persistent DOE and a mild cough- again these are most likely multifactorial in etiology w/ components from pulm/ cardiac/ deconditioning as noted; he is also on an ACE & although he quit smoking in 1991, he has been way too sedentary w/o exercise program; Labs are all WNL x borderline RAST pos to grasses... REC to STOP Lisinopril, monitor BP at home to see if additional med needed or not, deep breathing exercises... we plan rov recheck in 76mo 02/09/17>   Larry Bautista reminded to stop the Lisinopril completely & monitor his BP & symptoms going forward; hopefully his resp symptoms will resolve off the ACE + continued allergy rx per DrSharma;  He needs to maintain & push his exercise program. 12/18/17>    Larry Bautista acute bronchitis w/ an AB exac=> we discussed Rx w/ Levaquin & a tapering course of PRED; he will  also benefit from ProairHFA for prn use & the addition of MCINEX as a mucolytic... We plan an rov recheck in 3-4 weeks to assess the need for any long acting inhalers. 12/29/17>   IMP/PLAN>>  Larry Bautista is an ex-smoker w/ AB, AR, multifactorial dyspnea;  Asked to finish the Pred'10mg'$  Qod til gone, & use the albutHFA inhaler as needed; we reviewed need for regular exercise, we plan rov recheck in 3-94mo   Plan:     Patient's Medications  New Prescriptions   No medications on file  Previous Medications   ALBUTEROL (PROAIR HFA) 108 (90 BASE) MCG/ACT INHALER    Inhale 1-2 puffs into the lungs every 6 (six) hours as needed for wheezing or shortness of breath.   ASPIRIN 81 MG TABLET    Take 81 mg by mouth daily.   CETIRIZINE (ZYRTEC) 10 MG TABLET    Take 10 mg by mouth daily.   DIPHENHYDRAMINE-ACETAMINOPHEN (TYLENOL PM) 25-500 MG TABS    Take 1 tablet by mouth at bedtime as needed.   EPLERENONE (INSPRA) 25 MG TABLET    Take 1 tablet (25 mg total) by mouth daily. Please keep 5/31 appointment for additional refills thanks.   FISH OIL-OMEGA-3 FATTY ACIDS 1000 MG CAPSULE    Take 2 capsules by mouth daily.    FLUTICASONE (FLONASE) 50 MCG/ACT NASAL SPRAY    USE 2 SPRAYS IN EACH NOSTRIL DAILY   GABAPENTIN (NEURONTIN) 300 MG CAPSULE    Take 300 mg by mouth at bedtime. Every other day   MECLIZINE (ANTIVERT) 12.5 MG TABLET    Take 12.5 mg by mouth 3 (three) times daily as needed for dizziness.   METOPROLOL SUCCINATE (TOPROL-XL) 25 MG 24 HR TABLET    Take 1 tablet (25 mg total) by mouth daily. Please keep 5/31 appointment for additional refills thanks.   MULTIVITAMIN (THERAGRAN) PER TABLET    Take 1 tablet by mouth daily.   NITROGLYCERIN (NITROSTAT) 0.4 MG SL TABLET    Place 0.4 mg under the tongue every 5 (five) minutes as needed.   PRAVASTATIN (PRAVACHOL) 80 MG TABLET    Take 1 tablet (80 mg total) by mouth  daily. Please keep 5/31 appointment for additional refills thanks.   PREDNISONE (DELTASONE) 20 MG TABLET    Take as directed => finish '10mg'$  Qod til gone   PSYLLIUM (METAMUCIL) 58.6 % POWDER    Take 1 packet by mouth daily.    TAMSULOSIN (FLOMAX) 0.4 MG CAPS CAPSULE    Take 0.4 mg by mouth daily after supper.   VITAMIN C (ASCORBIC ACID) 500 MG TABLET    Take 500 mg by mouth daily.   WARFARIN (COUMADIN) 5 MG TABLET    TAKE ONE TABLET DAILY AS DIRECTED  Modified Medications   No medications on file  Discontinued Medications   LEVOFLOXACIN (LEVAQUIN) 500 MG TABLET    Take 1 tablet (500 mg total) by mouth daily.

## 2018-01-07 ENCOUNTER — Inpatient Hospital Stay (HOSPITAL_COMMUNITY): Payer: Medicare Other | Admitting: Anesthesiology

## 2018-01-07 ENCOUNTER — Encounter (HOSPITAL_COMMUNITY)
Admission: EM | Disposition: A | Discharge status: Home / Self Care | Payer: Self-pay | Source: Home / Self Care | Attending: Family Medicine

## 2018-01-07 ENCOUNTER — Encounter (HOSPITAL_COMMUNITY): Payer: Self-pay | Admitting: Emergency Medicine

## 2018-01-07 ENCOUNTER — Other Ambulatory Visit: Payer: Self-pay

## 2018-01-07 ENCOUNTER — Inpatient Hospital Stay (HOSPITAL_COMMUNITY)
Admission: EM | Admit: 2018-01-07 | Discharge: 2018-01-09 | DRG: 375 | Disposition: A | Discharge status: Home / Self Care | Payer: Medicare Other | Attending: Family Medicine | Admitting: Family Medicine

## 2018-01-07 DIAGNOSIS — I480 Paroxysmal atrial fibrillation: Secondary | ICD-10-CM | POA: Diagnosis present

## 2018-01-07 DIAGNOSIS — I5032 Chronic diastolic (congestive) heart failure: Secondary | ICD-10-CM | POA: Diagnosis present

## 2018-01-07 DIAGNOSIS — D649 Anemia, unspecified: Secondary | ICD-10-CM | POA: Diagnosis not present

## 2018-01-07 DIAGNOSIS — I110 Hypertensive heart disease with heart failure: Secondary | ICD-10-CM | POA: Diagnosis present

## 2018-01-07 DIAGNOSIS — Z79899 Other long term (current) drug therapy: Secondary | ICD-10-CM

## 2018-01-07 DIAGNOSIS — E785 Hyperlipidemia, unspecified: Secondary | ICD-10-CM | POA: Diagnosis present

## 2018-01-07 DIAGNOSIS — I2510 Atherosclerotic heart disease of native coronary artery without angina pectoris: Secondary | ICD-10-CM | POA: Diagnosis present

## 2018-01-07 DIAGNOSIS — E861 Hypovolemia: Secondary | ICD-10-CM | POA: Diagnosis not present

## 2018-01-07 DIAGNOSIS — D62 Acute posthemorrhagic anemia: Secondary | ICD-10-CM | POA: Diagnosis present

## 2018-01-07 DIAGNOSIS — K920 Hematemesis: Secondary | ICD-10-CM | POA: Diagnosis not present

## 2018-01-07 DIAGNOSIS — K921 Melena: Secondary | ICD-10-CM | POA: Diagnosis not present

## 2018-01-07 DIAGNOSIS — Z87891 Personal history of nicotine dependence: Secondary | ICD-10-CM

## 2018-01-07 DIAGNOSIS — K922 Gastrointestinal hemorrhage, unspecified: Secondary | ICD-10-CM

## 2018-01-07 DIAGNOSIS — I959 Hypotension, unspecified: Secondary | ICD-10-CM | POA: Diagnosis present

## 2018-01-07 DIAGNOSIS — Z9109 Other allergy status, other than to drugs and biological substances: Secondary | ICD-10-CM

## 2018-01-07 DIAGNOSIS — C160 Malignant neoplasm of cardia: Secondary | ICD-10-CM | POA: Diagnosis not present

## 2018-01-07 DIAGNOSIS — Z7901 Long term (current) use of anticoagulants: Secondary | ICD-10-CM

## 2018-01-07 DIAGNOSIS — N400 Enlarged prostate without lower urinary tract symptoms: Secondary | ICD-10-CM | POA: Diagnosis present

## 2018-01-07 DIAGNOSIS — K3189 Other diseases of stomach and duodenum: Secondary | ICD-10-CM | POA: Diagnosis not present

## 2018-01-07 DIAGNOSIS — Z049 Encounter for examination and observation for unspecified reason: Secondary | ICD-10-CM | POA: Diagnosis not present

## 2018-01-07 DIAGNOSIS — T45515A Adverse effect of anticoagulants, initial encounter: Secondary | ICD-10-CM | POA: Diagnosis present

## 2018-01-07 DIAGNOSIS — I10 Essential (primary) hypertension: Secondary | ICD-10-CM | POA: Diagnosis present

## 2018-01-07 DIAGNOSIS — Z7982 Long term (current) use of aspirin: Secondary | ICD-10-CM

## 2018-01-07 DIAGNOSIS — D490 Neoplasm of unspecified behavior of digestive system: Secondary | ICD-10-CM | POA: Diagnosis not present

## 2018-01-07 DIAGNOSIS — D6832 Hemorrhagic disorder due to extrinsic circulating anticoagulants: Secondary | ICD-10-CM | POA: Diagnosis present

## 2018-01-07 DIAGNOSIS — Z955 Presence of coronary angioplasty implant and graft: Secondary | ICD-10-CM | POA: Diagnosis not present

## 2018-01-07 DIAGNOSIS — Z7951 Long term (current) use of inhaled steroids: Secondary | ICD-10-CM | POA: Diagnosis not present

## 2018-01-07 DIAGNOSIS — Z8249 Family history of ischemic heart disease and other diseases of the circulatory system: Secondary | ICD-10-CM | POA: Diagnosis not present

## 2018-01-07 DIAGNOSIS — J40 Bronchitis, not specified as acute or chronic: Secondary | ICD-10-CM | POA: Diagnosis present

## 2018-01-07 HISTORY — PX: ESOPHAGOGASTRODUODENOSCOPY (EGD) WITH PROPOFOL: SHX5813

## 2018-01-07 LAB — PROTIME-INR
INR: 4
INR: 1.57
PROTHROMBIN TIME: 38.7 s — AB (ref 11.4–15.2)
Prothrombin Time: 18.7 seconds — ABNORMAL HIGH (ref 11.4–15.2)

## 2018-01-07 LAB — CBC WITH DIFFERENTIAL/PLATELET
Basophils Absolute: 0 10*3/uL (ref 0.0–0.1)
Basophils Relative: 0 %
EOS PCT: 0 %
Eosinophils Absolute: 0 10*3/uL (ref 0.0–0.7)
HCT: 20.1 % — ABNORMAL LOW (ref 39.0–52.0)
Hemoglobin: 6.4 g/dL — CL (ref 13.0–17.0)
LYMPHS ABS: 1.5 10*3/uL (ref 0.7–4.0)
LYMPHS PCT: 13 %
MCH: 32.2 pg (ref 26.0–34.0)
MCHC: 31.8 g/dL (ref 30.0–36.0)
MCV: 101 fL — AB (ref 78.0–100.0)
Monocytes Absolute: 1.2 10*3/uL — ABNORMAL HIGH (ref 0.1–1.0)
Monocytes Relative: 10 %
Neutro Abs: 8.6 10*3/uL — ABNORMAL HIGH (ref 1.7–7.7)
Neutrophils Relative %: 77 %
Platelets: 187 10*3/uL (ref 150–400)
RBC: 1.99 MIL/uL — AB (ref 4.22–5.81)
RDW: 15.9 % — ABNORMAL HIGH (ref 11.5–15.5)
WBC: 11.3 10*3/uL — AB (ref 4.0–10.5)

## 2018-01-07 LAB — COMPREHENSIVE METABOLIC PANEL
ALBUMIN: 2.4 g/dL — AB (ref 3.5–5.0)
ALT: 29 U/L (ref 17–63)
AST: 24 U/L (ref 15–41)
Alkaline Phosphatase: 28 U/L — ABNORMAL LOW (ref 38–126)
Anion gap: 7 (ref 5–15)
BUN: 32 mg/dL — AB (ref 6–20)
CHLORIDE: 113 mmol/L — AB (ref 101–111)
CO2: 23 mmol/L (ref 22–32)
Calcium: 7.9 mg/dL — ABNORMAL LOW (ref 8.9–10.3)
Creatinine, Ser: 0.81 mg/dL (ref 0.61–1.24)
GFR calc Af Amer: >60 mL/min (ref >60)
Glucose, Bld: 149 mg/dL — ABNORMAL HIGH (ref 65–99)
POTASSIUM: 4.4 mmol/L (ref 3.5–5.1)
SODIUM: 143 mmol/L (ref 135–145)
Total Bilirubin: 0.5 mg/dL (ref 0.3–1.2)
Total Protein: 3.9 g/dL — ABNORMAL LOW (ref 6.5–8.1)

## 2018-01-07 LAB — HEMOGLOBIN AND HEMATOCRIT, BLOOD
HCT: 25.8 % — ABNORMAL LOW (ref 39.0–52.0)
Hemoglobin: 8.4 g/dL — ABNORMAL LOW (ref 13.0–17.0)

## 2018-01-07 LAB — I-STAT CHEM 8, ED
BUN: 32 mg/dL — AB (ref 6–20)
CREATININE: 0.8 mg/dL (ref 0.61–1.24)
Calcium, Ion: 1.18 mmol/L (ref 1.15–1.40)
Chloride: 110 mmol/L (ref 101–111)
Glucose, Bld: 139 mg/dL — ABNORMAL HIGH (ref 65–99)
HCT: 16 % — ABNORMAL LOW (ref 39.0–52.0)
Hemoglobin: 5.4 g/dL — CL (ref 13.0–17.0)
Potassium: 4.3 mmol/L (ref 3.5–5.1)
Sodium: 144 mmol/L (ref 135–145)
TCO2: 22 mmol/L (ref 22–32)

## 2018-01-07 LAB — POC OCCULT BLOOD, ED: FECAL OCCULT BLD: POSITIVE — AB

## 2018-01-07 LAB — CBG MONITORING, ED: Glucose-Capillary: 76 mg/dL (ref 65–99)

## 2018-01-07 LAB — MRSA PCR SCREENING: MRSA BY PCR: POSITIVE — AB

## 2018-01-07 LAB — PREPARE RBC (CROSSMATCH)

## 2018-01-07 LAB — APTT: aPTT: 37 seconds — ABNORMAL HIGH (ref 24–36)

## 2018-01-07 LAB — GLUCOSE, CAPILLARY: GLUCOSE-CAPILLARY: 99 mg/dL (ref 65–99)

## 2018-01-07 LAB — BLOOD PRODUCT ORDER (VERBAL) VERIFICATION

## 2018-01-07 LAB — ABO/RH: ABO/RH(D): A POS

## 2018-01-07 SURGERY — ESOPHAGOGASTRODUODENOSCOPY (EGD) WITH PROPOFOL
Anesthesia: Monitor Anesthesia Care

## 2018-01-07 MED ORDER — ONDANSETRON HCL 4 MG PO TABS: 4.0000 mg | ORAL_TABLET | Freq: Four times a day (QID) | ORAL | Status: DC | PRN

## 2018-01-07 MED ORDER — PANTOPRAZOLE SODIUM 40 MG IV SOLR: 40.0000 mg | Freq: Two times a day (BID) | INTRAVENOUS | Status: DC

## 2018-01-07 MED ORDER — SODIUM CHLORIDE 0.9 % IV SOLN: 10.0000 mL/h | Freq: Once | INTRAVENOUS | Status: DC

## 2018-01-07 MED ORDER — SODIUM CHLORIDE 0.9 % IV SOLN
80.0000 mg | Freq: Once | INTRAVENOUS | Status: AC
  Administered 2018-01-07: 80 mg via INTRAVENOUS
  Filled 2018-01-07: qty 80

## 2018-01-07 MED ORDER — PROPOFOL 10 MG/ML IV BOLUS
INTRAVENOUS | Status: DC | PRN
  Administered 2018-01-07: 25 mg via INTRAVENOUS
  Administered 2018-01-07: 15 mg via INTRAVENOUS

## 2018-01-07 MED ORDER — SODIUM CHLORIDE 0.9 % IV SOLN
INTRAVENOUS | Status: DC
  Administered 2018-01-07: 14:00:00 via INTRAVENOUS

## 2018-01-07 MED ORDER — SODIUM CHLORIDE 0.9 % IV SOLN
8.0000 mg/h | INTRAVENOUS | Status: DC
  Administered 2018-01-07 – 2018-01-09 (×4): 8 mg/h via INTRAVENOUS
  Filled 2018-01-07 (×8): qty 80

## 2018-01-07 MED ORDER — SODIUM CHLORIDE 0.9 % IV BOLUS: 1000.0000 mL | Freq: Once | INTRAVENOUS | Status: DC

## 2018-01-07 MED ORDER — ONDANSETRON HCL 4 MG/2ML IJ SOLN
4.0000 mg | Freq: Once | INTRAMUSCULAR | Status: AC
  Administered 2018-01-07: 4 mg via INTRAVENOUS
  Filled 2018-01-07: qty 2

## 2018-01-07 MED ORDER — VITAMIN K1 10 MG/ML IJ SOLN
5.0000 mg | Freq: Once | INTRAVENOUS | Status: AC
  Administered 2018-01-07: 5 mg via INTRAVENOUS
  Filled 2018-01-07: qty 0.5

## 2018-01-07 MED ORDER — PROPOFOL 500 MG/50ML IV EMUL
INTRAVENOUS | Status: DC | PRN
  Administered 2018-01-07: 125 ug/kg/min via INTRAVENOUS

## 2018-01-07 MED ORDER — ACETAMINOPHEN 325 MG PO TABS: 650.0000 mg | ORAL_TABLET | Freq: Four times a day (QID) | ORAL | Status: DC | PRN

## 2018-01-07 MED ORDER — ONDANSETRON HCL 4 MG/2ML IJ SOLN
4.0000 mg | Freq: Four times a day (QID) | INTRAMUSCULAR | Status: DC | PRN
  Administered 2018-01-07: 4 mg via INTRAVENOUS

## 2018-01-07 MED ORDER — METOPROLOL TARTRATE 5 MG/5ML IV SOLN: 5.0000 mg | Freq: Four times a day (QID) | INTRAVENOUS | Status: DC | PRN

## 2018-01-07 MED ORDER — SODIUM CHLORIDE 0.9 % IV BOLUS
1000.0000 mL | Freq: Once | INTRAVENOUS | Status: AC
  Administered 2018-01-07: 1000 mL via INTRAVENOUS

## 2018-01-07 MED ORDER — PHENYLEPHRINE HCL 10 MG/ML IJ SOLN
INTRAMUSCULAR | Status: DC | PRN
Start: 2018-01-07 — End: 2018-01-07
  Administered 2018-01-07: 120 ug via INTRAVENOUS

## 2018-01-07 MED ORDER — ACETAMINOPHEN 650 MG RE SUPP: 650.0000 mg | Freq: Four times a day (QID) | RECTAL | Status: DC | PRN

## 2018-01-07 MED ORDER — SODIUM CHLORIDE 0.9 % IV SOLN
10.0000 mL/h | Freq: Once | INTRAVENOUS | Status: AC
  Administered 2018-01-07: 10 mL/h via INTRAVENOUS

## 2018-01-07 SURGICAL SUPPLY — 15 items

## 2018-01-07 NOTE — Transfer of Care (Signed)
Immediate Anesthesia Transfer of Care Note  Patient: Larry Bautista  Procedure(s) Performed: ESOPHAGOGASTRODUODENOSCOPY (EGD) WITH PROPOFOL (N/A )  Patient Location: Endoscopy Unit  Anesthesia Type:MAC  Level of Consciousness: lethargic and responds to stimulation  Airway & Oxygen Therapy: Patient Spontanous Breathing  Post-op Assessment: Report given to RN and Post -op Vital signs reviewed and stable  Post vital signs: Reviewed and stable  Last Vitals:  Vitals Value Taken Time  BP    Temp    Pulse 95 01/07/2018  2:46 PM  Resp 18 01/07/2018  2:46 PM  SpO2 100 % 01/07/2018  2:46 PM  Vitals shown include unvalidated device data.  Last Pain:  Vitals:   01/07/18 1324  TempSrc: Oral  PainSc: 0-No pain         Complications: No apparent anesthesia complications

## 2018-01-07 NOTE — Consult Note (Signed)
Subjective:   HPI  The patient is a 77 year old male with multiple medical problems as mentioned below. He presented to the emergency room with complaints of melena which started yesterday as well as hematemesis. He has been feeling weak and dizzy. He has been on Coumadin. His INR was elevated and his hemoglobin was down. In the emergency room he was given blood transfusion, fresh frozen plasma, and vitamin K. He is feeling better.  Review of Systems No chest pain or shortness of breath at this time  Past Medical History:  Diagnosis Date  . Allergy   . CHF (congestive heart failure) ([place])   . Chronic knee pain   . Coronary artery disease    with LAD DE stent 2004  . Heel spur, left   . Hyperlipidemia   . Hypertension   . Kidney stones   . Lower back pain    status post surgery for spondylolisthesis  . Migraine   . Persistent atrial fibrillation ([place])    longstanding persistent (since 05/2008)  . Plantar fasciitis    Past Surgical History:  Procedure Laterality Date  . BACK SURGERY     spondylolisthesis  . CARDIAC CATHETERIZATION  03/06/2003  . CORONARY ANGIOPLASTY WITH STENT PLACEMENT  04/2003   CYPHER stent implantation in the LAD  . HAND SURGERY Right 2014   carpal tunnel  . SKIN CANCER EXCISION  1980's   Face  . VASCULAR SURGERY     Vascular Stent   Social History   Socioeconomic History  . Marital status: Married    Spouse name: Jerold Yoss  . Number of children: 1  . Years of education: Not on file  . Highest education level: Bachelor's degree (e.g., BA, AB, BS)  Occupational History  . Occupation: retired  Scientific laboratory technician  . Financial resource strain: Not hard at all  . Food insecurity:    Worry: Never true    Inability: Never true  . Transportation needs:    Medical: No    Non-medical: No  Tobacco Use  . Smoking status: Former Smoker    Types: Cigarettes    Last attempt to quit: 11/06/1996    Years since quitting: 21.1  . Smokeless tobacco: Never  Used  Substance and Sexual Activity  . Alcohol use: No  . Drug use: No  . Sexual activity: Not on file  Lifestyle  . Physical activity:    Days per week: Not on file    Minutes per session: Not on file  . Stress: Not at all  Relationships  . Social connections:    Talks on phone: Not on file    Gets together: Not on file    Attends religious service: Not on file    Active member of club or organization: Not on file    Attends meetings of clubs or organizations: Not on file    Relationship status: Not on file  . Intimate partner violence:    Fear of current or ex partner: Not on file    Emotionally abused: Not on file    Physically abused: Not on file    Forced sexual activity: Not on file  Other Topics Concern  . Not on file  Social History Narrative  . Not on file   family history includes Atrial fibrillation in his brother and brother; COPD in his brother; Colon cancer in his father; Congestive Heart Failure in his brother and father; Heart attack in his father; Lung cancer in his brother and mother;  Migraines in his daughter; Stroke in his brother.  Current Facility-Administered Medications:  .  0.9 %  sodium chloride infusion, 10 mL/hr, Intravenous, Once, Rise Patience, MD .  acetaminophen (TYLENOL) tablet 650 mg, 650 mg, Oral, Q6H PRN **OR** acetaminophen (TYLENOL) suppository 650 mg, 650 mg, Rectal, Q6H PRN, Rise Patience, MD .  ondansetron (ZOFRAN) tablet 4 mg, 4 mg, Oral, Q6H PRN **OR** ondansetron (ZOFRAN) injection 4 mg, 4 mg, Intravenous, Q6H PRN, Rise Patience, MD .  pantoprazole (PROTONIX) 80 mg in sodium chloride 0.9 % 250 mL (0.32 mg/mL) infusion, 8 mg/hr, Intravenous, Continuous, Rise Patience, MD, Last Rate: 25 mL/hr at 01/07/18 0509, 8 mg/hr at 01/07/18 0509 .  [START ON 01/10/2018] pantoprazole (PROTONIX) injection 40 mg, 40 mg, Intravenous, Q12H, Rise Patience, MD  Current Outpatient Medications:  .  albuterol (PROAIR HFA) 108  (90 Base) MCG/ACT inhaler, Inhale 1-2 puffs into the lungs every 6 (six) hours as needed for wheezing or shortness of breath., Disp: 1 Inhaler, Rfl: prn .  aspirin 81 MG tablet, Take 81 mg by mouth daily., Disp: , Rfl:  .  cetirizine (ZYRTEC) 10 MG tablet, Take 10 mg by mouth daily., Disp: , Rfl:  .  diphenhydramine-acetaminophen (TYLENOL PM) 25-500 MG TABS, Take 1 tablet by mouth at bedtime as needed (for sleep). , Disp: , Rfl:  .  eplerenone (INSPRA) 25 MG tablet, Take 1 tablet (25 mg total) by mouth daily. Please keep 5/31 appointment for additional refills thanks., Disp: 30 tablet, Rfl: 1 .  fish oil-omega-3 fatty acids 1000 MG capsule, Take 2 capsules by mouth daily. , Disp: , Rfl:  .  fluticasone (FLONASE) 50 MCG/ACT nasal spray, USE 2 SPRAYS IN EACH NOSTRIL DAILY, Disp: 48 g, Rfl: 3 .  gabapentin (NEURONTIN) 300 MG capsule, Take 300 mg by mouth every other day. , Disp: , Rfl:  .  metoprolol succinate (TOPROL-XL) 25 MG 24 hr tablet, Take 1 tablet (25 mg total) by mouth daily. Please keep 5/31 appointment for additional refills thanks., Disp: 30 tablet, Rfl: 1 .  multivitamin (THERAGRAN) per tablet, Take 1 tablet by mouth daily., Disp: , Rfl:  .  nitroGLYCERIN (NITROSTAT) 0.4 MG SL tablet, Place 0.4 mg under the tongue every 5 (five) minutes as needed., Disp: , Rfl:  .  pravastatin (PRAVACHOL) 80 MG tablet, Take 1 tablet (80 mg total) by mouth daily. Please keep 5/31 appointment for additional refills thanks., Disp: 30 tablet, Rfl: 1 .  predniSONE (DELTASONE) 20 MG tablet, Take as directed (Patient taking differently: Take 10 mg by mouth every other day. ), Disp: 20 tablet, Rfl: 0 .  psyllium (METAMUCIL) 58.6 % powder, Take 1 packet by mouth daily. , Disp: , Rfl:  .  tamsulosin (FLOMAX) 0.4 MG CAPS capsule, Take 0.4 mg by mouth daily after supper., Disp: , Rfl:  .  vitamin C (ASCORBIC ACID) 500 MG tablet, Take 500 mg by mouth daily., Disp: , Rfl:  .  warfarin (COUMADIN) 2.5 MG tablet, Take 2.5  mg by mouth daily., Disp: , Rfl:  .  warfarin (COUMADIN) 5 MG tablet, TAKE ONE TABLET DAILY AS DIRECTED (Patient not taking: Reported on 01/07/2018), Disp: 90 tablet, Rfl: 0 Allergies  Allergen Reactions  . Simvastatin Other (See Comments)    Leg pain  . Lisinopril Cough  . Celecoxib Rash     Objective:     BP 104/63   Pulse 94   Temp 99 F (37.2 C)   Resp 20  Ht 5\' 8"  (1.727 m)   Wt 85.7 kg (189 lb)   SpO2 99%   BMI 28.74 kg/m   No acute distress  Heart regular rhythm no murmurs heard  Lungs clear  Abdomen soft and nontender      Laboratory No components found for: D1    Assessment:     Upper GI bleed  Coagulopathy secondary to Coumadin  Anemia  Multiple medical problems as mentioned above      Plan:    transfuse blood as needed. EGD later today. FFP and vitamin K to improve the coagulopathy.  Lab Results  Component Value Date   HGB 5.4 (LL) 01/07/2018   HGB 6.4 (LL) 01/07/2018   HGB 15.2 12/08/2017   HGB 15.3 11/13/2016   HCT 16.0 (L) 01/07/2018   HCT 20.1 (L) 01/07/2018   HCT 44.5 12/08/2017   HCT 45.2 11/13/2016   ALKPHOS 28 (L) 01/07/2018   ALKPHOS 61 12/11/2017   ALKPHOS 58 12/08/2017   AST 24 01/07/2018   AST 38 12/11/2017   AST 40 (H) 12/08/2017   ALT 29 01/07/2018   ALT 38 12/11/2017   ALT 31 12/08/2017

## 2018-01-07 NOTE — ED Notes (Signed)
Back from Endo, pt  aa wife at bedside

## 2018-01-07 NOTE — H&P (Signed)
History and Physical    MELDRICK BUTTERY IPJ:[id number] DOB: 1941-03-21 DOA: 01/07/2018  PCP: [name], MD  Patient coming from: Home.  Chief Complaint: Throwing up blood.  HPI: Larry Bautista is a 77 y.o. male with history of CAD, diastolic CHF, atrial fibrillation, hypertension presents to the ER with complaints of having hemoptysis.  Patient noticed black stools last 3 to 4 days with increasing shortness of breath.  Yesterday patient had thrown up large amount of blood and came to the ER.  Has been feeling dizzy and weak.  Denies any chest pain.  Patient was recently placed on prednisone for bronchitis and is on a tapering dose.  Also was on antibiotics.  ED Course: In the patient was initially hypotensive which improved with fluids.  Hemoglobin is around 6.4 and drop of almost 9 g from last month.  Stool for occult blood is positive.  3 units of blood transfusion has been ordered.  On-call gastroenterologist Dr. Manya Silvas has been consulted.  Patient is on Protonix infusion.  On exam patient is noted in distress.  Denies any abdominal pain.  Feels his abdomen is mildly distended.  Review of Systems: As per HPI, rest all negative.   Past Medical History:  Diagnosis Date  . Allergy   . CHF (congestive heart failure) ([place])   . Chronic knee pain   . Coronary artery disease    with LAD DE stent 2004  . Heel spur, left   . Hyperlipidemia   . Hypertension   . Kidney stones   . Lower back pain    status post surgery for spondylolisthesis  . Migraine   . Persistent atrial fibrillation ([place])    longstanding persistent (since 05/2008)  . Plantar fasciitis     Past Surgical History:  Procedure Laterality Date  . BACK SURGERY     spondylolisthesis  . CARDIAC CATHETERIZATION  03/06/2003  . CORONARY ANGIOPLASTY WITH STENT PLACEMENT  04/2003   CYPHER stent implantation in the LAD  . HAND SURGERY Right 2014   carpal tunnel  . SKIN CANCER EXCISION  1980's   Face  . VASCULAR  SURGERY     Vascular Stent     reports that he quit smoking about 21 years ago. His smoking use included cigarettes. He has never used smokeless tobacco. He reports that he does not drink alcohol or use drugs.  Allergies  Allergen Reactions  . Simvastatin Other (See Comments)    Leg pain  . Lisinopril Cough  . Celecoxib Rash    Family History  Problem Relation Age of Onset  . Heart attack Father   . Colon cancer Father   . Congestive Heart Failure Father   . Atrial fibrillation Brother   . Stroke Brother   . Lung cancer Mother   . Atrial fibrillation Brother   . COPD Brother   . Congestive Heart Failure Brother   . Lung cancer Brother   . Migraines Daughter     Prior to Admission medications   Medication Sig Start Date End Date Taking? Authorizing Provider  albuterol (PROAIR HFA) 108 (90 Base) MCG/ACT inhaler Inhale 1-2 puffs into the lungs every 6 (six) hours as needed for wheezing or shortness of breath. 12/08/17  Yes Noralee Space, MD  aspirin 81 MG tablet Take 81 mg by mouth daily.   Yes [provider]  cetirizine (ZYRTEC) 10 MG tablet Take 10 mg by mouth daily.   Yes [provider]  diphenhydramine-acetaminophen (TYLENOL  PM) 25-500 MG TABS Take 1 tablet by mouth at bedtime as needed (for sleep).    Yes [provider]  eplerenone (INSPRA) 25 MG tablet Take 1 tablet (25 mg total) by mouth daily. Please keep 5/31 appointment for additional refills thanks. 12/15/17  Yes Belva Crome, MD  fish oil-omega-3 fatty acids 1000 MG capsule Take 2 capsules by mouth daily.    Yes [provider]  fluticasone (FLONASE) 50 MCG/ACT nasal spray USE 2 SPRAYS IN EACH NOSTRIL DAILY 12/14/17  Yes [name], MD  gabapentin (NEURONTIN) 300 MG capsule Take 300 mg by mouth every other day.    Yes [provider]  metoprolol succinate (TOPROL-XL) 25 MG 24 hr tablet Take 1 tablet (25 mg total) by mouth daily. Please keep 5/31 appointment for  additional refills thanks. 12/15/17  Yes Belva Crome, MD  multivitamin [facility]) per tablet Take 1 tablet by mouth daily.   Yes [provider]  nitroGLYCERIN (NITROSTAT) 0.4 MG SL tablet Place 0.4 mg under the tongue every 5 (five) minutes as needed.   Yes [provider]  pravastatin (PRAVACHOL) 80 MG tablet Take 1 tablet (80 mg total) by mouth daily. Please keep 5/31 appointment for additional refills thanks. 12/15/17  Yes Belva Crome, MD  predniSONE (DELTASONE) 20 MG tablet Take as directed Patient taking differently: Take 10 mg by mouth every other day.  12/08/17  Yes Noralee Space, MD  psyllium (METAMUCIL) 58.6 % powder Take 1 packet by mouth daily.    Yes [provider]  tamsulosin (FLOMAX) 0.4 MG CAPS capsule Take 0.4 mg by mouth daily after supper.   Yes [provider]  vitamin C (ASCORBIC ACID) 500 MG tablet Take 500 mg by mouth daily.   Yes [provider]  warfarin (COUMADIN) 2.5 MG tablet Take 2.5 mg by mouth daily.   Yes [provider]  warfarin (COUMADIN) 5 MG tablet TAKE ONE TABLET DAILY AS DIRECTED Patient not taking: Reported on 01/07/2018 12/15/17   Belva Crome, MD    Physical Exam: Vitals:   01/07/18 0415 01/07/18 0430 01/07/18 0445 01/07/18 0454  BP: (!) 120/91 106/85  (!) 90/53  Pulse: 97 (!) 101 98 97  Resp: (!) 26 (!) 22 18 19   Temp:    98.6 F (37 C)  TempSrc:    Oral  SpO2: 99% 100% 98% 100%  Weight:      Height:          Constitutional: Moderately built and nourished. Vitals:   01/07/18 0415 01/07/18 0430 01/07/18 0445 01/07/18 0454  BP: (!) 120/91 106/85  (!) 90/53  Pulse: 97 (!) 101 98 97  Resp: (!) 26 (!) 22 18 19   Temp:    98.6 F (37 C)  TempSrc:    Oral  SpO2: 99% 100% 98% 100%  Weight:      Height:       Eyes: Anicteric no pallor. ENMT: No discharge from the ears eyes nose or mouth. Neck: No mass palpated no neck rigidity no JVD appreciated. Respiratory: No rhonchi or  crepitations. Cardiovascular: S1-S2 heard no murmurs appreciated. Abdomen: Soft nontender bowel sounds present. Musculoskeletal: No edema.  No joint effusion. Skin: Skin appears pale.  No rash. Neurologic: Alert awake oriented to time place and person.  Moves all extremities. Psychiatric: Appears normal.  Normal affect.   Labs on Admission: I have personally reviewed following labs and imaging studies  CBC: Recent Labs  Lab 01/07/18 0225  01/07/18 0235  WBC 11.3*  --   NEUTROABS 8.6*  --   HGB 6.4* 5.4*  HCT 20.1* 16.0*  MCV 101.0*  --   PLT 187  --    Basic Metabolic Panel: Recent Labs  Lab 01/07/18 0225 01/07/18 0235  NA 143 144  K 4.4 4.3  CL 113* 110  CO2 23  --   GLUCOSE 149* 139*  BUN 32* 32*  CREATININE 0.81 0.80  CALCIUM 7.9*  --    GFR: Estimated Creatinine Clearance: 81.1 mL/min (by C-G formula based on SCr of 0.8 mg/dL). Liver Function Tests: Recent Labs  Lab 01/07/18 0225  AST 24  ALT 29  ALKPHOS 28*  BILITOT 0.5  PROT 3.9*  ALBUMIN 2.4*   No results for input(s): LIPASE, AMYLASE in the last 168 hours. No results for input(s): AMMONIA in the last 168 hours. Coagulation Profile: Recent Labs  Lab 01/07/18 0225  INR 4.00   Cardiac Enzymes: No results for input(s): CKTOTAL, CKMB, CKMBINDEX, TROPONINI in the last 168 hours. BNP (last 3 results) No results for input(s): PROBNP in the last 8760 hours. HbA1C: No results for input(s): HGBA1C in the last 72 hours. CBG: No results for input(s): GLUCAP in the last 168 hours. Lipid Profile: No results for input(s): CHOL, HDL, LDLCALC, TRIG, CHOLHDL, LDLDIRECT in the last 72 hours. Thyroid Function Tests: No results for input(s): TSH, T4TOTAL, FREET4, T3FREE, THYROIDAB in the last 72 hours. Anemia Panel: No results for input(s): VITAMINB12, FOLATE, FERRITIN, TIBC, IRON, RETICCTPCT in the last 72 hours. Urine analysis: No results found for: COLORURINE, APPEARANCEUR, LABSPEC, PHURINE, GLUCOSEU,  HGBUR, BILIRUBINUR, KETONESUR, PROTEINUR, UROBILINOGEN, NITRITE, LEUKOCYTESUR Sepsis Labs: @LABRCNTIP (procalcitonin:4,lacticidven:4) )No results found for this or any previous visit (from the past 240 hour(s)).   Radiological Exams on Admission: No results found.   Assessment/Plan Principal Problem:   Acute GI bleeding Active Problems:   Essential hypertension   CAD in native artery   AF (paroxysmal atrial fibrillation) (HCC)   Acute blood loss anemia    1. Acute GI bleeding -Dr. Watt Climes on-call gastroenterologist has been consulted.  Will keep patient n.p.o. in anticipation of EGD.  Blood pressure improved with fluids.  Continue to monitor closely and stepdown.  3 units of packed red blood cell transfusion has been ordered.  Patient is on Protonix.  Likely upper GI bleed. 2. Acute blood loss anemia secondary to GI bleed follow CBC after transfusion. 3. Coagulopathy secondary to Coumadin.  Patient received vitamin K 5 mg IV and 1 unit of FFP is being ordered.  Follow INR after transfusion.  Patient was explained about reversing Coumadin and the risk. 4. History of atrial fibrillation presently was hypotensive.  Closely monitor in stepdown. 5. History of CAD -denies any chest pain at this time but closely monitor.  Presently n.p.o. 6. History of diastolic dysfunction holding diuretics due to hypotension.  Receiving blood. 7. Recent use of steroids for bronchitis.  If blood pressure goes low again may need stress dose steroids.   DVT prophylaxis: SCDs. Code Status: Full code. Family Communication: Patient's wife. Disposition Plan: Home. Consults called: Copywriter, advertising. Admission status: Inpatient.   Rise Patience MD [facility] Pager [phone].  If 7PM-7AM, please contact night-coverage [email or web address] Password [email or web address]  01/07/2018, 5:08 AM

## 2018-01-07 NOTE — ED Notes (Signed)
Clear liquid diet provided to pt.  

## 2018-01-07 NOTE — Progress Notes (Signed)
Patient transferred from ED to [phone]. Patient A&Ox4. Stepdown monitor [id number] applied and second verified. Skin assessment completed by 2 this RN and [place],RN. Patient instructed how to use the callbell before getting out of bed. Callbell within reach. Admission handbook given to patient. Will continue to monitor and treat per MD orders.

## 2018-01-07 NOTE — ED Notes (Signed)
GI DR INTO SEE PT

## 2018-01-07 NOTE — ED Notes (Signed)
Clear liquid diet ordered with service response.

## 2018-01-07 NOTE — Anesthesia Preprocedure Evaluation (Signed)
Anesthesia Evaluation  Patient identified by MRN, date of birth, ID band Patient awake    Reviewed: Allergy & Precautions, NPO status , Patient's Chart, lab work & pertinent test results, reviewed documented beta blocker date and time   History of Anesthesia Complications Negative for: history of anesthetic complications  Airway Mallampati: I  TM Distance: >3 FB Neck ROM: Full    Dental  (+) Caps, Dental Advisory Given   Pulmonary asthma (inhalers, prednisone) , former smoker,    breath sounds clear to auscultation       Cardiovascular hypertension, Pt. on medications and Pt. on home beta blockers (-) angina+ CAD and + Cardiac Stents  + dysrhythmias Atrial Fibrillation  Rhythm:Irregular Rate:Normal     Neuro/Psych negative neurological ROS     GI/Hepatic Neg liver ROS, GI bleed   Endo/Other  negative endocrine ROS  Renal/GU negative Renal ROS     Musculoskeletal   Abdominal   Peds  Hematology  (+) Blood dyscrasia (Coumadin: INR 1.57), , Hb 8.4 s/p 2u pRBCs   Anesthesia Other Findings   Reproductive/Obstetrics                             Anesthesia Physical Anesthesia Plan  ASA: III  Anesthesia Plan: MAC   Post-op Pain Management:    Induction: Intravenous  PONV Risk Score and Plan: 1 and Ondansetron and Treatment may vary due to age or medical condition  Airway Management Planned: Nasal Cannula and Natural Airway  Additional Equipment:   Intra-op Plan:   Post-operative Plan:   Informed Consent: I have reviewed the patients History and Physical, chart, labs and discussed the procedure including the risks, benefits and alternatives for the proposed anesthesia with the patient or authorized representative who has indicated his/her understanding and acceptance.   Dental advisory given  Plan Discussed with: CRNA and Surgeon  Anesthesia Plan Comments: (Plan routine monitors,  MAC)        Anesthesia Quick Evaluation

## 2018-01-07 NOTE — ED Notes (Signed)
Got pt a  Pillow and wife went to get food

## 2018-01-07 NOTE — Op Note (Signed)
[facility] Patient Name: Larry Bautista Procedure Date : 01/07/2018 MRN: [id number] Attending MD: Wonda Horner , MD Date of Birth: 1941/06/13 CSN: [id number] Age: 77 Admit Type: Inpatient Procedure:                Upper GI endoscopy Indications:              Hematemesis, Melena Providers:                Wonda Horner, MD, Baird Cancer, RN, William Dalton, Technician Referring MD:              Medicines:                Propofol per Anesthesia Complications:            No immediate complications. Estimated Blood Loss:     Estimated blood loss was minimal. Procedure:                Pre-Anesthesia Assessment:                           - Prior to the procedure, a History and Physical                            was performed, and patient medications and                            allergies were reviewed. The patient's tolerance of                            previous anesthesia was also reviewed. The risks                            and benefits of the procedure and the sedation                            options and risks were discussed with the patient.                            All questions were answered, and informed consent                            was obtained. Prior Anticoagulants: The patient has                            taken Coumadin (warfarin), last dose was 1 day                            prior to procedure. ASA Grade Assessment: III - A                            patient with severe systemic disease. After  reviewing the risks and benefits, the patient was                            deemed in satisfactory condition to undergo the                            procedure.                           After obtaining informed consent, the endoscope was                            passed under direct vision. Throughout the                            procedure, the patient's blood pressure, pulse, and                  oxygen saturations were monitored continuously. The                            [id number] ([id number]) scope was introduced through the                            mouth, and advanced to the second part of duodenum.                            The upper GI endoscopy was accomplished without                            difficulty. The patient tolerated the procedure                            well. Scope In: Scope Out: Findings:      A medium-sized, ulcerating mass was found at the gastroesophageal       junction. The mass was non-obstructing and not circumferential. Biopsies       were taken with a cold forceps for histology.      Erythematous mucosa was found in the prepyloric region of the stomach.      The in the duodenum was normal. 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 Biopsied.                           - Erythematous mucosa in the prepyloric region of                            the stomach.                           - Normal. Recommendation:           - Clear liquid diet.                           -  Continue present medications.                           - no coumadin at this time Procedure Code(s):        --- Professional ---                           [id number], Esophagogastroduodenoscopy, flexible,                            transoral; with biopsy, single or multiple Diagnosis Code(s):        --- Professional ---                           D49.0, Neoplasm of unspecified behavior of                            digestive system                           K31.89, Other diseases of stomach and duodenum                           K92.0, Hematemesis                           K92.1, Melena (includes Hematochezia) CPT copyright 2017 [employer]. All rights reserved. The codes documented in this report are preliminary and upon coder review may  be revised to meet current compliance  requirements. Wonda Horner, MD 01/07/2018 2:51:42 PM This report has been signed electronically. Number of Addenda: 0

## 2018-01-07 NOTE — Progress Notes (Signed)
Received report from [place], [place] in the ED.

## 2018-01-07 NOTE — Anesthesia Procedure Notes (Signed)
Procedure Name: MAC Date/Time: 01/07/2018 2:42 PM Performed by: White, Amedeo Plenty, CRNA Pre-anesthesia Checklist: Patient identified, Emergency Drugs available, Suction available and Patient being monitored Patient Re-evaluated:Patient Re-evaluated prior to induction Oxygen Delivery Method: Nasal cannula

## 2018-01-07 NOTE — Anesthesia Postprocedure Evaluation (Signed)
Anesthesia Post Note  Patient: Larry Bautista  Procedure(s) Performed: ESOPHAGOGASTRODUODENOSCOPY (EGD) WITH PROPOFOL (N/A )     Patient location during evaluation: Endoscopy Anesthesia Type: MAC Level of consciousness: awake and alert, oriented and patient cooperative Pain management: pain level controlled Vital Signs Assessment: post-procedure vital signs reviewed and stable Respiratory status: spontaneous breathing, nonlabored ventilation and respiratory function stable Cardiovascular status: blood pressure returned to baseline and stable Postop Assessment: no apparent nausea or vomiting Anesthetic complications: no    Last Vitals:  Vitals:   01/07/18 1500 01/07/18 1505  BP: 117/82 129/62  Pulse: 75 88  Resp: 20 (!) 22  Temp:    SpO2: 100% 100%    Last Pain:  Vitals:   01/07/18 1508  TempSrc:   PainSc: 0-No pain                 Teya Otterson,E. Tiarra Anastacio

## 2018-01-07 NOTE — ED Provider Notes (Signed)
TIME SEEN: 2:24 AM  CHIEF COMPLAINT: Melena, vomiting blood  HPI: Patient is a 77 year old male with history of hypertension, hyperlipidemia, CAD, CHF, atrial fibrillation on Coumadin who presents to the emergency department with 2 to 3 days of melena and tonight had one episode of vomiting bright red blood.  States he feels like he vomited approximately 2 coffee cups worth of blood.  Has never had a GI bleed before.  History of colonoscopy 8 years ago with Dr. Amedeo Plenty.  No history of endoscopy.  No history of alcohol abuse, esophageal varices, NSAID use.  He is on Coumadin and a baby aspirin daily.  No abdominal pain.  No chest pain or shortness of breath.  States he feels very weak.  Patient was hypotensive with EMS.  ROS: See HPI Constitutional: no fever  Eyes: no drainage  ENT: no runny nose   Cardiovascular:  no chest pain  Resp: no SOB  GI:  vomiting GU: no dysuria Integumentary: no rash  Allergy: no hives  Musculoskeletal: no leg swelling  Neurological: no slurred speech ROS otherwise negative  PAST MEDICAL HISTORY/PAST SURGICAL HISTORY:  Past Medical History:  Diagnosis Date  . Allergy   . CHF (congestive heart failure) ([place])   . Chronic knee pain   . Coronary artery disease    with LAD DE stent 2004  . Heel spur, left   . Hyperlipidemia   . Hypertension   . Kidney stones   . Lower back pain    status post surgery for spondylolisthesis  . Migraine   . Persistent atrial fibrillation ([place])    longstanding persistent (since 05/2008)  . Plantar fasciitis     MEDICATIONS:  Prior to Admission medications   Medication Sig Start Date End Date Taking? Authorizing Provider  albuterol (PROAIR HFA) 108 (90 Base) MCG/ACT inhaler Inhale 1-2 puffs into the lungs every 6 (six) hours as needed for wheezing or shortness of breath. 12/08/17   Noralee Space, MD  aspirin 81 MG tablet Take 81 mg by mouth daily.    [provider]  cetirizine (ZYRTEC) 10 MG tablet Take 10 mg by  mouth daily.    [provider]  diphenhydramine-acetaminophen (TYLENOL PM) 25-500 MG TABS Take 1 tablet by mouth at bedtime as needed.    [provider]  eplerenone (INSPRA) 25 MG tablet Take 1 tablet (25 mg total) by mouth daily. Please keep 5/31 appointment for additional refills thanks. 12/15/17   Belva Crome, MD  fish oil-omega-3 fatty acids 1000 MG capsule Take 2 capsules by mouth daily.     [provider]  fluticasone (FLONASE) 50 MCG/ACT nasal spray USE 2 SPRAYS IN [facility] NOSTRIL DAILY 12/14/17   [name], MD  gabapentin (NEURONTIN) 300 MG capsule Take 300 mg by mouth at bedtime. Every other day    [provider]  meclizine (ANTIVERT) 12.5 MG tablet Take 12.5 mg by mouth 3 (three) times daily as needed for dizziness.    [provider]  metoprolol succinate (TOPROL-XL) 25 MG 24 hr tablet Take 1 tablet (25 mg total) by mouth daily. Please keep 5/31 appointment for additional refills thanks. 12/15/17   Belva Crome, MD  multivitamin [facility]) per tablet Take 1 tablet by mouth daily.    [provider]  nitroGLYCERIN (NITROSTAT) 0.4 MG SL tablet Place 0.4 mg under the tongue every 5 (five) minutes as needed.    [provider]  pravastatin (PRAVACHOL) 80 MG tablet Take 1 tablet (80  mg total) by mouth daily. Please keep 5/31 appointment for additional refills thanks. 12/15/17   Belva Crome, MD  predniSONE (DELTASONE) 20 MG tablet Take as directed 12/08/17   Noralee Space, MD  psyllium (METAMUCIL) 58.6 % powder Take 1 packet by mouth daily.     [provider]  tamsulosin (FLOMAX) 0.4 MG CAPS capsule Take 0.4 mg by mouth daily after supper.    [provider]  vitamin C (ASCORBIC ACID) 500 MG tablet Take 500 mg by mouth daily.    [provider]  warfarin (COUMADIN) 5 MG tablet TAKE ONE TABLET DAILY AS DIRECTED 12/15/17   Belva Crome, MD    ALLERGIES:  Allergies  Allergen Reactions  .  Simvastatin Other (See Comments)    Leg pain  . Lisinopril Cough  . Celecoxib Rash    SOCIAL HISTORY:  Social History   Tobacco Use  . Smoking status: Former Smoker    Types: Cigarettes    Last attempt to quit: 11/06/1996    Years since quitting: 21.1  . Smokeless tobacco: Never Used  Substance Use Topics  . Alcohol use: No    FAMILY HISTORY: Family History  Problem Relation Age of Onset  . Heart attack Father   . Colon cancer Father   . Congestive Heart Failure Father   . Atrial fibrillation Brother   . Stroke Brother   . Lung cancer Mother   . Atrial fibrillation Brother   . COPD Brother   . Congestive Heart Failure Brother   . Lung cancer Brother   . Migraines Daughter     EXAM: BP 125/68   Pulse 96   Temp 98.7 F (37.1 C) (Oral)   Resp (!) 21   Ht 5\' 8"  (1.727 m)   Wt 85.7 kg (189 lb)   SpO2 100%   BMI 28.74 kg/m  CONSTITUTIONAL: Alert and oriented and responds appropriately to questions.  Elderly, has pale appearance HEAD: Normocephalic EYES: Conjunctivae clear, pupils appear equal, EOMI, pale conjunctiva ENT: normal nose; moist mucous membranes, dried blood noted around his lips NECK: Supple, no meningismus, no nuchal rigidity, no LAD  CARD: Irregularly irregular; S1 and S2 appreciated; no murmurs, no clicks, no rubs, no gallops RESP: Normal chest excursion without splinting or tachypnea; breath sounds clear and equal bilaterally; no wheezes, no rhonchi, no rales, no hypoxia or respiratory distress, speaking full sentences ABD/GI: Normal bowel sounds; non-distended; soft, non-tender, no rebound, no guarding, no peritoneal signs, no hepatosplenomegaly RECTAL:  Normal rectal tone, no gross blood, + melena, guaiac +, no hemorrhoids appreciated, nontender rectal exam, no fecal impaction BACK:  The back appears normal and is non-tender to palpation, there is no CVA tenderness EXT: Normal ROM in all joints; non-tender to palpation; no edema; normal capillary  refill; no cyanosis, no calf tenderness or swelling    SKIN: Normal color for age and race; warm; no rash NEURO: Moves all extremities equally PSYCH: The patient's mood and manner are appropriate. Grooming and personal hygiene are appropriate.  MEDICAL DECISION MAKING: Patient here with GI bleed.  He is initially hypotensive with blood pressure of 71/62 with a heart rate in the 90s.  Appears very pale on examination with guaiac positive stool.  We will give emergent blood in place to peripheral IVs.  IV fluids have been started on patient.  Will give IV vitamin K until we have his INR back given he is on Coumadin.  He will need admission.  ED PROGRESS: Blood  pressure improving quickly with IV hydration.  Emergent blood has also been started.  Hemoglobin is 5.4 on his Chem-8.   Patient continues to be hemodynamically stable and is mentating normally.  Platelets normal.  Hemoglobin 6.4 on his CBC.  3 units of packed red blood cells have been ordered total.  Will discuss with medicine for admission.  3:33 AM Discussed patient's case with hospitalist, Dr. Hal Hope.  I have recommended admission and patient (and family if present) agree with this plan. Admitting physician will place admission orders.   I reviewed all nursing notes, vitals, pertinent previous records, EKGs, lab and urine results, imaging (as available).  Patient's INR is 4.  We have discussed risk and benefits of Coumadin reversal.  I feel benefits outweigh risk at this time.  He has received IV vitamin K.  Will give 1 unit of FFP.  4:30 AM  D/w Dr. Watt Climes with [facility].  They will see patient in consult this morning.  Appreciate GI help.   EKG Interpretation  Date/Time:  Thursday Jan 07 2018 03:10:54 EDT Ventricular Rate:  96 PR Interval:    QRS Duration: 75 QT Interval:  363 QTC Calculation: 459 R Axis:   47 Text Interpretation:  Atrial fibrillation Low voltage, extremity leads Nonspecific repol abnormality, diffuse leads A  fib is new compared to previous Confirmed by Pryor Curia [id number]) on 01/07/2018 3:38:42 AM       CRITICAL CARE Performed by: Cyril Mourning Jaedyn Marrufo   Total critical care time: 65 minutes  Critical care time was exclusive of separately billable procedures and treating other patients.  Critical care was necessary to treat or prevent imminent or life-threatening deterioration.  Critical care was time spent personally by me on the following activities: development of treatment plan with patient and/or surrogate as well as nursing, discussions with consultants, evaluation of patient's response to treatment, examination of patient, obtaining history from patient or surrogate, ordering and performing treatments and interventions, ordering and review of laboratory studies, ordering and review of radiographic studies, pulse oximetry and re-evaluation of patient's condition.     Lacara Dunsworth, Delice Bison, DO 01/07/18 0430

## 2018-01-07 NOTE — H&P (Signed)
Patient presents to the endoscopy unit for EGD to evaluate melena and hematemesis and anemia. Seen earlier today in the emergency room. He has been on Coumadin. INR mostly corrected.  Physical  No distress  Heart regular rhythm  Lungs clear  Abdomen soft nontender  Impression: Melena, hematemesis  Plan EGD

## 2018-01-07 NOTE — ED Triage Notes (Signed)
Pt BIB EMS from home. Pt reports 3-4days of black tarry stools. Began having bright red stools with single episode of hematemesis about 7-8hrs ago today. Pt takes coumadin for Afib. Initial SBP 68 for EMS on scene. Given 1L NS by EMS en route, returning pt to 124/78. Pt CAO x4 but reporting general weakness.

## 2018-01-07 NOTE — Progress Notes (Signed)
Patient in endoscopy. Patient admitted after midnight, please see earlier detailed admission note by Dr. Hal Hope. Briefly, patient presented with hematemesis found to have significant anemia with associated coagulopathy while on coumadin with elevated INR. GI consulted and have recommended urgent EGD. S/p 3 units PRBC.  Cordelia Poche, MD [facility] 01/07/2018, 1:39 PM

## 2018-01-07 NOTE — ED Notes (Signed)
Pt up to [facility] ([facility]) had  Med large burgundy stool, and then back in bed, Called  Endo and pt will be going at 130

## 2018-01-08 ENCOUNTER — Encounter (HOSPITAL_COMMUNITY): Payer: Self-pay | Admitting: Gastroenterology

## 2018-01-08 DIAGNOSIS — I480 Paroxysmal atrial fibrillation: Secondary | ICD-10-CM

## 2018-01-08 DIAGNOSIS — I2510 Atherosclerotic heart disease of native coronary artery without angina pectoris: Secondary | ICD-10-CM

## 2018-01-08 DIAGNOSIS — I10 Essential (primary) hypertension: Secondary | ICD-10-CM

## 2018-01-08 DIAGNOSIS — D62 Acute posthemorrhagic anemia: Secondary | ICD-10-CM

## 2018-01-08 LAB — BPAM FFP
Blood Product Expiration Date: 201905052359
ISSUE DATE / TIME: 201905020446
Unit Type and Rh: 6200

## 2018-01-08 LAB — CBC
HEMATOCRIT: 25.4 % — AB (ref 39.0–52.0)
Hemoglobin: 8.4 g/dL — ABNORMAL LOW (ref 13.0–17.0)
MCH: 31.9 pg (ref 26.0–34.0)
MCHC: 33.1 g/dL (ref 30.0–36.0)
MCV: 96.6 fL (ref 78.0–100.0)
PLATELETS: 146 10*3/uL — AB (ref 150–400)
RBC: 2.63 MIL/uL — AB (ref 4.22–5.81)
RDW: 18.1 % — AB (ref 11.5–15.5)
WBC: 8.6 10*3/uL (ref 4.0–10.5)

## 2018-01-08 LAB — PREPARE FRESH FROZEN PLASMA

## 2018-01-08 LAB — PROTIME-INR
INR: 1.33
Prothrombin Time: 16.3 seconds — ABNORMAL HIGH (ref 11.4–15.2)

## 2018-01-08 LAB — GLUCOSE, CAPILLARY: GLUCOSE-CAPILLARY: 84 mg/dL (ref 65–99)

## 2018-01-08 MED ORDER — MUPIROCIN 2 % EX OINT
1.0000 "application " | TOPICAL_OINTMENT | Freq: Two times a day (BID) | CUTANEOUS | Status: DC
  Administered 2018-01-08 – 2018-01-09 (×4): 1 via NASAL
  Filled 2018-01-08 (×2): qty 22

## 2018-01-08 MED ORDER — METOPROLOL SUCCINATE ER 25 MG PO TB24
25.0000 mg | ORAL_TABLET | Freq: Every day | ORAL | Status: DC
  Administered 2018-01-08 – 2018-01-09 (×2): 25 mg via ORAL
  Filled 2018-01-08 (×2): qty 1

## 2018-01-08 MED ORDER — CHLORHEXIDINE GLUCONATE CLOTH 2 % EX PADS
6.0000 | MEDICATED_PAD | Freq: Every day | CUTANEOUS | Status: DC
  Administered 2018-01-08 – 2018-01-09 (×2): 6 via TOPICAL

## 2018-01-08 NOTE — Progress Notes (Signed)
PROGRESS NOTE    Larry Bautista  PJA:[id number] DOB: Aug 20, 1941 DOA: 01/07/2018 PCP: [name], MD   Brief Narrative: Larry Bautista is a 77 y.o. male with history of CAD, diastolic heart failure, atrial fibrillation, hypertension.  Patient presented secondary to episodes of hematemesis and melena.  Patient found to have anemia with hemoglobin of 6.4 with concern for upper GI bleed.  He was seen by GI who recommended an upper endoscopy which was significant for esophageal neoplasm.  Preliminary pathology supports the diagnosis of adenocarcinoma.   Assessment & Plan:   Principal Problem:   Acute GI bleeding Active Problems:   Essential hypertension   CAD in native artery   AF (paroxysmal atrial fibrillation) (HCC)   Acute blood loss anemia   Acute GI bleeding Hematemesis Acute blood loss anemia Patient is status post 3 units of PRBC and EGD performed on 01/07/2018.  EGD significant for esophageal mass which was biopsied.  Per GI note, preliminary report significant for adenocarcinoma.  Surgical consult is been placed.  No overt bleeding overnight.  Patient still with some melena overnight.  Hemoglobin stable. -GI recommendations: Surgical consult  Coagulopathy Secondary to Coumadin.  Patient is status post 5 mg of vitamin K IV and 1 unit of fresh frozen plasma.  INR improved after vitamin K.  Paroxysmal atrial fibrillation Patient is on Coumadin as an outpatient.  Patient is rate controlled and uses metoprolol as an outpatient.  Held on admission secondary to hypotension. -Restart metoprolol  Hypertension Hypotensive on admission secondary to hypovolemia from acute blood loss anemia.  Resolved. -Restart metoprolol as mentioned above -Continue to hold eplerenone  CAD No chest pain.  Diastolic heart dysfunction  BPH On tamsulosin as an outpatient. On hold secondary to hypotension.    DVT prophylaxis: SCDs secondary to active GI bleeding Code Status:   Code  Status: Full Code Family Communication: None at bedside Disposition Plan: Discharge in 24-48 hours pending GI/surgery management   Consultants:   Gastroenterology  Procedures:   EGD (5/2) Impression:    - Esophageal tumor was found at the   gastroesophageal junction. Biopsied.   - Erythematous mucosa in the   prepyloric region of the stomach.   - Normal.  Recommendation:   - Clear liquid diet.   - Continue present medications.   - No coumadin at this time     Antimicrobials:  None    Subjective: Episode of melena overnight.  No hematochezia or hematemesis.  Objective: Vitals:   01/08/18 0232 01/08/18 0433 01/08/18 0743 01/08/18 0943  BP: 118/76  116/63 107/83  Pulse: 84  79 90  Resp: (!) 23  (!) 23 19  Temp: 98.5 F (36.9 C) 97.9 F (36.6 C)  98.2 F (36.8 C)  TempSrc: Oral Oral  Oral  SpO2: 99%  99% 94%  Weight:      Height:        Intake/Output Summary (Last 24 hours) at 01/08/2018 1239 Last data filed at 01/08/2018 0900 Gross per 24 hour  Intake 837.92 ml  Output 1355 ml  Net -517.08 ml   Filed Weights   01/07/18 0228  Weight: 85.7 kg (189 lb)    Examination:  General exam: Appears calm and comfortable Respiratory system: Clear to auscultation. Respiratory effort normal. Cardiovascular system: S1 & S2 heard, RRR. No murmurs, rubs, gallops or clicks. Gastrointestinal system: Abdomen is nondistended, soft and nontender. No organomegaly or masses felt. Normal bowel sounds heard. Central nervous system: Alert and oriented. No focal  neurological deficits. Extremities: No edema. No calf tenderness Skin: No cyanosis. No rashes Psychiatry: Judgement and insight appear normal. Mood & affect appropriate.     Data Reviewed: I have personally reviewed following labs and imaging studies  CBC: Recent Labs  Lab 01/07/18 0225 01/07/18 0235 01/07/18 1205 01/08/18 0749  WBC 11.3*  --   --  8.6  NEUTROABS 8.6*  --   --   --   HGB 6.4* 5.4* 8.4* 8.4*    HCT 20.1* 16.0* 25.8* 25.4*  MCV 101.0*  --   --  96.6  PLT 187  --   --  297*   Basic Metabolic Panel: Recent Labs  Lab 01/07/18 0225 01/07/18 0235  NA 143 144  K 4.4 4.3  CL 113* 110  CO2 23  --   GLUCOSE 149* 139*  BUN 32* 32*  CREATININE 0.81 0.80  CALCIUM 7.9*  --    GFR: Estimated Creatinine Clearance: 81.1 mL/min (by C-G formula based on SCr of 0.8 mg/dL). Liver Function Tests: Recent Labs  Lab 01/07/18 0225  AST 24  ALT 29  ALKPHOS 28*  BILITOT 0.5  PROT 3.9*  ALBUMIN 2.4*   No results for input(s): LIPASE, AMYLASE in the last 168 hours. No results for input(s): AMMONIA in the last 168 hours. Coagulation Profile: Recent Labs  Lab 01/07/18 0225 01/07/18 1205 01/08/18 0749  INR 4.00 1.57 1.33   Cardiac Enzymes: No results for input(s): CKTOTAL, CKMB, CKMBINDEX, TROPONINI in the last 168 hours. BNP (last 3 results) No results for input(s): PROBNP in the last 8760 hours. HbA1C: No results for input(s): HGBA1C in the last 72 hours. CBG: Recent Labs  Lab 01/07/18 1949 01/07/18 2234 01/08/18 0632  GLUCAP 76 99 84   Lipid Profile: No results for input(s): CHOL, HDL, LDLCALC, TRIG, CHOLHDL, LDLDIRECT in the last 72 hours. Thyroid Function Tests: No results for input(s): TSH, T4TOTAL, FREET4, T3FREE, THYROIDAB in the last 72 hours. Anemia Panel: No results for input(s): VITAMINB12, FOLATE, FERRITIN, TIBC, IRON, RETICCTPCT in the last 72 hours. Sepsis Labs: No results for input(s): PROCALCITON, LATICACIDVEN in the last 168 hours.  Recent Results (from the past 240 hour(s))  MRSA PCR Screening     Status: Abnormal   Collection Time: 01/07/18  9:08 PM  Result Value Ref Range Status   MRSA by PCR POSITIVE (A) NEGATIVE Final    Comment:        The GeneXpert MRSA Assay (FDA approved for NASAL specimens only), is one component of a comprehensive MRSA colonization surveillance program. It is not intended to diagnose MRSA infection nor to guide  or monitor treatment for MRSA infections. RESULT CALLED TO, READ BACK BY AND VERIFIED WITH: C COBLE RN 01/07/18 2353 JDW Performed at [facility], [place] [address], [place], [place] [address]          Radiology Studies: No results found.      Scheduled Meds: . Chlorhexidine Gluconate Cloth  6 each Topical Q0600  . mupirocin ointment  1 application Nasal BID  . [START ON 01/10/2018] pantoprazole  40 mg Intravenous Q12H   Continuous Infusions: . sodium chloride    . pantoprozole (PROTONIX) infusion 8 mg/hr (01/08/18 0014)     LOS: 1 day     Cordelia Poche, MD [facility] 01/08/2018, 12:39 PM Pager: [phone]  If 7PM-7AM, please contact night-coverage [email or web address] 01/08/2018, 12:39 PM

## 2018-01-08 NOTE — Consult Note (Signed)
Consulted by GI for adenocarcinoma at the GE junction. Patient is not obstructed and no longer overtly bleeding. Recommend consult to medical oncology. Recommend outpatient consult to cardiothoracic surgery if patient remains stable and oncology recommends it.  Brigid Re , [facility] 01/08/2018, 2:09 PM Pager: [phone] Consults: [phone] Mon-Fri 7:00 am-4:30 pm Sat-Sun 7:00 am-11:30 am

## 2018-01-08 NOTE — Progress Notes (Signed)
The patient has no complaints today. I received a call from pathology with a report that the ulcerated mass at the esophagogastric junction was an adenocarcinoma. Patient was informed of this. Surgical consultation has been placed.

## 2018-01-08 NOTE — Progress Notes (Signed)
Received notification from micro that patient's nasal swab is positive for MRSA in the nares. Patient notified of results and standing MRSA positive orders placed. Patient agreeable to plan. Will continue to monitor and treat per MD orders.

## 2018-01-09 LAB — GLUCOSE, CAPILLARY
GLUCOSE-CAPILLARY: 94 mg/dL (ref 65–99)
GLUCOSE-CAPILLARY: 93 mg/dL (ref 65–99)
Glucose-Capillary: 92 mg/dL (ref 65–99)

## 2018-01-09 MED ORDER — OMEPRAZOLE 20 MG PO CPDR: 20.0000 mg | DELAYED_RELEASE_CAPSULE | Freq: Every day | ORAL | 0 refills | Status: DC

## 2018-01-09 MED ORDER — FERROUS SULFATE 325 (65 FE) MG PO TABS: 325.0000 mg | ORAL_TABLET | Freq: Two times a day (BID) | ORAL | Status: DC

## 2018-01-09 NOTE — Discharge Summary (Signed)
Physician Discharge Summary  LAM MCCUBBINS QQP:[id number] DOB: February 21, 1941 DOA: 01/07/2018  PCP: [name], MD  Admit date: 01/07/2018 Discharge date: 01/09/2018  Admitted From: Home Disposition: Home  Recommendations for Outpatient Follow-up:  1. Follow up with PCP in 1 week 2. Follow up with gastroenterology, oncology, cardiothoracic surgery 3. Please obtain CBC in one week, sooner if recurrent bleeding 4. Please follow up on the following pending results: Esophageal mass biopsy  Home Health: None Equipment/Devices: None  Discharge Condition: Stable CODE STATUS: Full code Diet recommendation: Heart healthy   Brief/Interim Summary:  Admission HPI written by Gean Birchwood, MD   Chief Complaint: Throwing up blood.  HPI: Larry Bautista is a 77 y.o. male with history of CAD, diastolic CHF, atrial fibrillation, hypertension presents to the ER with complaints of having hemoptysis.  Patient noticed black stools last 3 to 4 days with increasing shortness of breath.  Yesterday patient had thrown up large amount of blood and came to the ER.  Has been feeling dizzy and weak.  Denies any chest pain.  Patient was recently placed on prednisone for bronchitis and is on a tapering dose.  Also was on antibiotics.  ED Course: In the patient was initially hypotensive which improved with fluids.  Hemoglobin is around 6.4 and drop of almost 9 g from last month.  Stool for occult blood is positive.  3 units of blood transfusion has been ordered.  On-call gastroenterologist Dr. Manya Silvas has been consulted.  Patient is on Protonix infusion.  On exam patient is noted in distress.  Denies any abdominal pain.  Feels his abdomen is mildly distended.    Hospital course:  Acute GI bleeding Hematemesis Acute blood loss anemia Patient is status post 3 units of PRBC and EGD performed on 01/07/2018.  EGD significant for esophageal mass which was biopsied.  Per GI note, preliminary report  significant for adenocarcinoma.  General surgery consulted and recommended cardiothoracic surgery consult as an outpatient.  Discussed with gastroenterology and recommendations to resume anticoagulation in setting of resolved GI bleeding and outpatient follow-up with gastroenterology and oncology for continued management of esophageal mass.  Patient without bloody stool overnight.  Hemoglobin stable at 8.4. Iron supplementation.  Coagulopathy Secondary to Coumadin.  Patient is status post 5 mg of vitamin K IV and 1 unit of fresh frozen plasma.  INR improved after vitamin K.  Paroxysmal atrial fibrillation Patient is on Coumadin as an outpatient.  Patient is rate controlled and uses metoprolol as an outpatient.  Held on admission secondary to hypotension.  Continue home medications on discharge  Hypertension Hypotensive on admission secondary to hypovolemia from acute blood loss anemia.  Hypotension resolved.  Resume home medications on discharge.  CAD No chest pain.  Diastolic heart dysfunction Noted on echocardiogram.  Patient was hypovolemic from acute blood loss and is now euvolemic.  BPH On tamsulosin as an outpatient.   Discharge Diagnoses:  Principal Problem:   Acute GI bleeding Active Problems:   Essential hypertension   CAD in native artery   AF (paroxysmal atrial fibrillation) (HCC)   Acute blood loss anemia    Discharge Instructions  Discharge Instructions    Call MD for:  extreme fatigue   Complete by:  As directed    Call MD for:  persistant dizziness or light-headedness   Complete by:  As directed    Call MD for:  persistant nausea and vomiting   Complete by:  As directed    Diet -  low sodium heart healthy   Complete by:  As directed    Increase activity slowly   Complete by:  As directed      Allergies as of 01/09/2018      Reactions   Simvastatin Other (See Comments)   Leg pain   Lisinopril Cough   Celecoxib Rash      Medication List    TAKE  these medications   albuterol 108 (90 Base) MCG/ACT inhaler Commonly known as:  PROAIR HFA Inhale 1-2 puffs into the lungs every 6 (six) hours as needed for wheezing or shortness of breath.   aspirin 81 MG tablet Take 81 mg by mouth daily.   cetirizine 10 MG tablet Commonly known as:  ZYRTEC Take 10 mg by mouth daily.   diphenhydramine-acetaminophen 25-500 MG Tabs tablet Commonly known as:  TYLENOL PM Take 1 tablet by mouth at bedtime as needed (for sleep).   eplerenone 25 MG tablet Commonly known as:  INSPRA Take 1 tablet (25 mg total) by mouth daily. Please keep 5/31 appointment for additional refills thanks.   ferrous sulfate 325 (65 FE) MG tablet Take 1 tablet (325 mg total) by mouth 2 (two) times daily with a meal.   fish oil-omega-3 fatty acids 1000 MG capsule Take 2 capsules by mouth daily.   fluticasone 50 MCG/ACT nasal spray Commonly known as:  FLONASE USE 2 SPRAYS IN EACH NOSTRIL DAILY   gabapentin 300 MG capsule Commonly known as:  NEURONTIN Take 300 mg by mouth every other day.   metoprolol succinate 25 MG 24 hr tablet Commonly known as:  TOPROL-XL Take 1 tablet (25 mg total) by mouth daily. Please keep 5/31 appointment for additional refills thanks.   multivitamin per tablet Take 1 tablet by mouth daily.   nitroGLYCERIN 0.4 MG SL tablet Commonly known as:  NITROSTAT Place 0.4 mg under the tongue every 5 (five) minutes as needed.   omeprazole 20 MG capsule Commonly known as:  PRILOSEC Take 1 capsule (20 mg total) by mouth daily.   pravastatin 80 MG tablet Commonly known as:  PRAVACHOL Take 1 tablet (80 mg total) by mouth daily. Please keep 5/31 appointment for additional refills thanks.   predniSONE 20 MG tablet Commonly known as:  DELTASONE Take as directed What changed:    how much to take  how to take this  when to take this  additional instructions   psyllium 58.6 % powder Commonly known as:  METAMUCIL Take 1 packet by mouth daily.    tamsulosin 0.4 MG Caps capsule Commonly known as:  FLOMAX Take 0.4 mg by mouth daily after supper.   vitamin C 500 MG tablet Commonly known as:  ASCORBIC ACID Take 500 mg by mouth daily.   warfarin 2.5 MG tablet Commonly known as:  COUMADIN Take as directed. If you are unsure how to take this medication, talk to your nurse or doctor. Original instructions:  Take 2.5 mg by mouth daily.   warfarin 5 MG tablet Commonly known as:  COUMADIN Take as directed. If you are unsure how to take this medication, talk to your nurse or doctor. Original instructions:  TAKE ONE TABLET DAILY AS DIRECTED      Follow-up Information    [name], MD. Schedule an appointment as soon as possible for a visit in 1 week(s).   Specialty:  Family Medicine Contact information: [address] [place] [address] [phone]        Wonda Horner, MD. Schedule  an appointment as soon as possible for a visit in 1 week(s).   Specialty:  Gastroenterology Why:  Esophageal mass Contact information: [address] [place] [address] [phone]        Ardath Sax, MD. Schedule an appointment as soon as possible for a visit in 1 week(s).   Specialty:  Hematology and Oncology Why:  Esophageal mass Contact information: [place] [address] [phone]        Grace Isaac, MD. Schedule an appointment as soon as possible for a visit.   Specialty:  Cardiothoracic Surgery Why:  This will be directed by the gastroenterologist and oncologist for your esophageal mass Contact information: [address] [place] [address] [phone]          Allergies  Allergen Reactions  . Simvastatin Other (See Comments)    Leg pain  . Lisinopril Cough  . Celecoxib Rash    Consultations:  Gastroenterology  General surgery  Oncology   Procedures/Studies: No results found.  EGD (5/3) Impression:                               - Esophageal tumor was found at the gastroesophageal junction. Biopsied.                         - Erythematous mucosa in the prepyloric region of the stomach.                         - Normal.  Recommendation:                         - Clear liquid diet.                         - Continue present medications.                         - No coumadin at this time  Subjective: No melena/blood stools.   Discharge Exam: Vitals:   01/09/18 0519 01/09/18 0644  BP: 106/69 129/89  Pulse: 89   Resp: 20 14  Temp: 98.6 F (37 C)   SpO2: 100%    Vitals:   01/09/18 0318 01/09/18 0504 01/09/18 0519 01/09/18 0644  BP: 114/69  106/69 129/89  Pulse: 80  89   Resp: 20  20 14   Temp:  98.1 F (36.7 C) 98.6 F (37 C)   TempSrc:  Oral Oral   SpO2: 99%  100%   Weight:      Height:        General: Pt is alert, awake, not in acute distress Cardiovascular: RRR, S1/S2 +, no rubs, no gallops Respiratory: CTA bilaterally, no wheezing, no rhonchi Abdominal: Soft, NT, ND, bowel sounds + Extremities: no edema, no cyanosis    The results of significant diagnostics from this hospitalization (including imaging, microbiology, ancillary and laboratory) are listed below for reference.     Microbiology: Recent Results (from the past 240 hour(s))  MRSA PCR Screening     Status: Abnormal   Collection Time: 01/07/18  9:08 PM  Result Value Ref Range Status   MRSA by PCR POSITIVE (A) NEGATIVE Final    Comment:        The GeneXpert MRSA Assay (  FDA approved for NASAL specimens only), is one component of a comprehensive MRSA colonization surveillance program. It is not intended to diagnose MRSA infection nor to guide or monitor treatment for MRSA infections. RESULT CALLED TO, READ BACK BY AND VERIFIED WITH: C COBLE RN 01/07/18 2353 JDW Performed at [facility], [place] [address], [place], [place] [address]      Labs: BNP (last 3 results) No results for input(s): BNP in the  last 8760 hours. Basic Metabolic Panel: Recent Labs  Lab 01/07/18 0225 01/07/18 0235  NA 143 144  K 4.4 4.3  CL 113* 110  CO2 23  --   GLUCOSE 149* 139*  BUN 32* 32*  CREATININE 0.81 0.80  CALCIUM 7.9*  --    Liver Function Tests: Recent Labs  Lab 01/07/18 0225  AST 24  ALT 29  ALKPHOS 28*  BILITOT 0.5  PROT 3.9*  ALBUMIN 2.4*   No results for input(s): LIPASE, AMYLASE in the last 168 hours. No results for input(s): AMMONIA in the last 168 hours. CBC: Recent Labs  Lab 01/07/18 0225 01/07/18 0235 01/07/18 1205 01/08/18 0749  WBC 11.3*  --   --  8.6  NEUTROABS 8.6*  --   --   --   HGB 6.4* 5.4* 8.4* 8.4*  HCT 20.1* 16.0* 25.8* 25.4*  MCV 101.0*  --   --  96.6  PLT 187  --   --  146*   Cardiac Enzymes: No results for input(s): CKTOTAL, CKMB, CKMBINDEX, TROPONINI in the last 168 hours. BNP: Invalid input(s): POCBNP CBG: Recent Labs  Lab 01/07/18 1949 01/07/18 2234 01/08/18 0632 01/09/18 0013 01/09/18 0531  GLUCAP 76 99 84 92 94   D-Dimer No results for input(s): DDIMER in the last 72 hours. Hgb A1c No results for input(s): HGBA1C in the last 72 hours. Lipid Profile No results for input(s): CHOL, HDL, LDLCALC, TRIG, CHOLHDL, LDLDIRECT in the last 72 hours. Thyroid function studies No results for input(s): TSH, T4TOTAL, T3FREE, THYROIDAB in the last 72 hours.  Invalid input(s): FREET3 Anemia work up No results for input(s): VITAMINB12, FOLATE, FERRITIN, TIBC, IRON, RETICCTPCT in the last 72 hours. Urinalysis No results found for: COLORURINE, APPEARANCEUR, [place], [place], [place], [place], [place], [place], PROTEINUR, UROBILINOGEN, NITRITE, LEUKOCYTESUR Sepsis Labs Invalid input(s): PROCALCITONIN,  WBC,  LACTICIDVEN Microbiology Recent Results (from the past 240 hour(s))  MRSA PCR Screening     Status: Abnormal   Collection Time: 01/07/18  9:08 PM  Result Value Ref Range Status   MRSA by PCR POSITIVE (A) NEGATIVE Final    Comment:          The GeneXpert MRSA Assay (FDA approved for NASAL specimens only), is one component of a comprehensive MRSA colonization surveillance program. It is not intended to diagnose MRSA infection nor to guide or monitor treatment for MRSA infections. RESULT CALLED TO, READ BACK BY AND VERIFIED WITH: C COBLE RN 01/07/18 2353 JDW Performed at [facility], [place] [address], [place], [place] [address]      SIGNED:   Cordelia Poche, MD [facility] 01/09/2018, 12:02 PM

## 2018-01-09 NOTE — Progress Notes (Signed)
Patient was discharged home by MD order; discharged instructions  review and give to patient and his wife with care notes and prescription; IV DIC; patient will be escorted to the car by nurse tech via wheelchair.

## 2018-01-09 NOTE — Discharge Instructions (Signed)
Larry Bautista,  You were admitted because of hematemesis (throwing up blood).  He had an EGD performed which is significant for an esophageal mass.  Preliminary report suggests an adenocarcinoma (cancer).  You will need continued follow-up with multiple practitioners including a gastroenterologist, oncologist, and cardiothoracic surgery.  Your Coumadin was initially held because of the GI bleeding.  Upon discussion with gastroenterology, it is okay to restart your Coumadin.  If you have recurrent bleeding, please seek medical attention immediately.

## 2018-01-11 ENCOUNTER — Telehealth: Payer: Self-pay | Admitting: *Deleted

## 2018-01-11 ENCOUNTER — Telehealth: Payer: Self-pay

## 2018-01-11 LAB — TYPE AND SCREEN
ABO/RH(D): A POS
Antibody Screen: NEGATIVE
UNIT DIVISION: 0
UNIT DIVISION: 0
UNIT DIVISION: 0
Unit division: 0

## 2018-01-11 LAB — BPAM RBC
BLOOD PRODUCT EXPIRATION DATE: 201905272359
Blood Product Expiration Date: 201905232359
Blood Product Expiration Date: 201905232359
Blood Product Expiration Date: 201905272359
ISSUE DATE / TIME: 201904301005
ISSUE DATE / TIME: 201905020628
ISSUE DATE / TIME: 201905020231
ISSUE DATE / TIME: 201905030050
UNIT TYPE AND RH: 6200
UNIT TYPE AND RH: 9500
Unit Type and Rh: 6200
Unit Type and Rh: 9500

## 2018-01-11 NOTE — Telephone Encounter (Signed)
Pt called & stated that he was in the hospital recently and was told not to resume Coumadin then at d/c the d/c papers stated him to resume Coumadin per pt. The pt & wife were both on the phone and stated they need to know what to do. Advise pt the the [facility] cannot resume the Coumadin but the he needs to call either Cardiologist, PCP, or GI MD as they are unsure who stopped the Coumadin. Pt states he will remain off until he gets an answer and advised them to call back with more info.

## 2018-01-11 NOTE — Progress Notes (Signed)
Patient called our new patient referral line and was forwarded to me. Mrs. Larry Bautista called to ask what next steps are for her husband. He was discharged from

## 2018-01-11 NOTE — Telephone Encounter (Signed)
Transition Care Management Follow-Up Telephone Call   Date discharged and where: [facility] on 01/09/18.  How have you been since you were released from the hospital? Not doing great, still weak, having SOB and black liquid stools. Pt states abdominal pain is better and he declines vomiting, dizziness or fever.   Any patient concerns? None.  Items Reviewed:   Meds: Unable to verify all meds. Did states he is completely off his coumadin and has started taking Eplerenone, Iron and Metoprolol.  Allergies: verified  Dietary Changes Reviewed: clear liquid diet  Functional Questionnaire:  Independent-I Dependent-D  ADLs:   Dressing- I    Eating- I   Maintaining continence- I   Transferring- I   Transportation- I, not currently   Meal Prep- D   Managing Meds- D  Confirmed importance and Date/Time of follow-up visits scheduled: NONE- pt states he is going to check his schedule and will CB. Per discharge summary, pt is to f/u with PCP in 1 week.   Confirmed with patient if condition worsens to call PCP or go to the Emergency Dept. Patient was given office number and encouraged to call back with questions or concerns: YES

## 2018-01-11 NOTE — Consult Note (Signed)
            [facility] CM Primary Care Navigator  01/11/2018  Larry Bautista September 04, 1941 [id number]   Attempt toseepatient at the bedside to identify possible discharge needsbuthe was already dischargedhome.  Patient was seen and treated for acute GI bleeding/ acute blood loss anemia.  Primary care provider's officeis listed as providingtransition of care (TOC)follow-up.   Patient has discharge instruction to follow-up withprimary care provider and gastroenterology in 1 week.   For additional questions please contact:  Edwena Felty A. Ahkeem Goede, BSN, RN-BC [facility] PRIMARY CARE Navigator Cell: [phone]

## 2018-01-12 ENCOUNTER — Ambulatory Visit (INDEPENDENT_AMBULATORY_CARE_PROVIDER_SITE_OTHER): Payer: Medicare Other | Admitting: Family Medicine

## 2018-01-12 ENCOUNTER — Encounter: Payer: Self-pay | Admitting: *Deleted

## 2018-01-12 VITALS — BP 110/84 | HR 81 | Temp 97.7°F | Resp 16 | Wt 189.0 lb

## 2018-01-12 DIAGNOSIS — D62 Acute posthemorrhagic anemia: Secondary | ICD-10-CM | POA: Diagnosis not present

## 2018-01-12 DIAGNOSIS — I480 Paroxysmal atrial fibrillation: Secondary | ICD-10-CM | POA: Diagnosis not present

## 2018-01-12 DIAGNOSIS — Z22322 Carrier or suspected carrier of Methicillin resistant Staphylococcus aureus: Secondary | ICD-10-CM

## 2018-01-12 DIAGNOSIS — K922 Gastrointestinal hemorrhage, unspecified: Secondary | ICD-10-CM | POA: Diagnosis not present

## 2018-01-12 DIAGNOSIS — L0390 Cellulitis, unspecified: Secondary | ICD-10-CM | POA: Diagnosis not present

## 2018-01-12 DIAGNOSIS — I2510 Atherosclerotic heart disease of native coronary artery without angina pectoris: Secondary | ICD-10-CM | POA: Diagnosis not present

## 2018-01-12 MED ORDER — CEPHALEXIN 500 MG PO CAPS: 500.0000 mg | ORAL_CAPSULE | Freq: Four times a day (QID) | ORAL | 0 refills | Status: AC

## 2018-01-12 NOTE — Progress Notes (Signed)
Patient: Larry Bautista Male    DOB: Jan 14, 1941   77 y.o.   MRN: [id number] Visit Date: 01/12/2018  Today's Provider: Lelon Huh, MD   Chief Complaint  Patient presents with  . Hospitalization Follow-up   Subjective:    HPI   Follow up Hospitalization  Patient was admitted to [facility] on 01/07/2018 and discharged on 01/09/2018. He was treated for GI bleed with melena and hematemis. His INR on admission was 4.0 and has been off of warfarin and aspirin since then.  Treatment for this included giving patient IV fluids and 3 units of blood transfusion. EGD was performed revealing esophageal mass. Biopsy was significant for Adenocarcinoma. It was recommended that patient have cardiothoracic surgery as an outpatient. Patient was to resume anticoagulation in the setting of resolved GI bleeding, and to follow up with GI and Oncology for continued management of esophageal mass, however, he states he has not resumed warfarin or aspirin.  Telephone follow up was done on 01/11/2018 He reports good compliance with treatment. He reports this condition is Improved. He is tolerating thickened liquids and anticipated starting soft foods. He is also on supplemental iron. He states his stools are still black, but not sure if this is due to iron or not. Nausea has resolved and has had no vomiting nor spitting up of blood since discharge. He still feels fatigued and a little short of breath. He has appointment with oncology scheduled 01/14/2018    Allergies  Allergen Reactions  . Simvastatin Other (See Comments)    Leg pain  . Lisinopril Cough  . Celecoxib Rash     Current Outpatient Medications:  .  albuterol (PROAIR HFA) 108 (90 Base) MCG/ACT inhaler, Inhale 1-2 puffs into the lungs every 6 (six) hours as needed for wheezing or shortness of breath., Disp: 1 Inhaler, Rfl: prn .  aspirin 81 MG tablet, Take 81 mg by mouth daily., Disp: , Rfl:  .  cetirizine (ZYRTEC) 10 MG tablet, Take 10 mg by  mouth daily., Disp: , Rfl:  .  diphenhydramine-acetaminophen (TYLENOL PM) 25-500 MG TABS, Take 1 tablet by mouth at bedtime as needed (for sleep). , Disp: , Rfl:  .  eplerenone (INSPRA) 25 MG tablet, Take 1 tablet (25 mg total) by mouth daily. Please keep 5/31 appointment for additional refills thanks., Disp: 30 tablet, Rfl: 1 .  ferrous sulfate 325 (65 FE) MG tablet, Take 1 tablet (325 mg total) by mouth 2 (two) times daily with a meal., Disp: , Rfl:  .  fish oil-omega-3 fatty acids 1000 MG capsule, Take 2 capsules by mouth daily. , Disp: , Rfl:  .  fluticasone (FLONASE) 50 MCG/ACT nasal spray, USE 2 SPRAYS IN EACH NOSTRIL DAILY, Disp: 48 g, Rfl: 3 .  gabapentin (NEURONTIN) 300 MG capsule, Take 300 mg by mouth every other day. , Disp: , Rfl:  .  metoprolol succinate (TOPROL-XL) 25 MG 24 hr tablet, Take 1 tablet (25 mg total) by mouth daily. Please keep 5/31 appointment for additional refills thanks., Disp: 30 tablet, Rfl: 1 .  multivitamin (THERAGRAN) per tablet, Take 1 tablet by mouth daily., Disp: , Rfl:  .  nitroGLYCERIN (NITROSTAT) 0.4 MG SL tablet, Place 0.4 mg under the tongue every 5 (five) minutes as needed., Disp: , Rfl:  .  omeprazole (PRILOSEC) 20 MG capsule, Take 1 capsule (20 mg total) by mouth daily., Disp: 30 capsule, Rfl: 0 .  pravastatin (PRAVACHOL) 80 MG tablet, Take 1 tablet (80 mg  total) by mouth daily. Please keep 5/31 appointment for additional refills thanks., Disp: 30 tablet, Rfl: 1 .  predniSONE (DELTASONE) 20 MG tablet, Take as directed (Patient taking differently: Take 10 mg by mouth every other day. ), Disp: 20 tablet, Rfl: 0 .  psyllium (METAMUCIL) 58.6 % powder, Take 1 packet by mouth daily. , Disp: , Rfl:  .  tamsulosin (FLOMAX) 0.4 MG CAPS capsule, Take 0.4 mg by mouth daily after supper., Disp: , Rfl:  .  vitamin C (ASCORBIC ACID) 500 MG tablet, Take 500 mg by mouth daily., Disp: , Rfl:  .  warfarin (COUMADIN) 2.5 MG tablet, Take 2.5 mg by mouth daily., Disp: , Rfl:    .  warfarin (COUMADIN) 5 MG tablet, TAKE ONE TABLET DAILY AS DIRECTED, Disp: 90 tablet, Rfl: 0  Review of Systems  Constitutional: Negative for appetite change, chills and fever.  Respiratory: Negative for chest tightness, shortness of breath and wheezing.   Cardiovascular: Negative for chest pain and palpitations.  Gastrointestinal: Negative for abdominal pain, nausea and vomiting.    Social History   Tobacco Use  . Smoking status: Former Smoker    Types: Cigarettes    Last attempt to quit: 11/06/1996    Years since quitting: 21.1  . Smokeless tobacco: Never Used  Substance Use Topics  . Alcohol use: No   Objective:   BP 110/84 (BP Location: Right Arm, Patient Position: Sitting, Cuff Size: Normal)   Pulse 81   Temp 97.7 F (36.5 C) (Oral)   Resp 16   Wt 189 lb (85.7 kg)   SpO2 98%   BMI 28.74 kg/m  Vitals:   01/12/18 1456  BP: 110/84  Pulse: 81  Resp: 16  Temp: 97.7 F (36.5 C)  TempSrc: Oral  SpO2: 98%  Weight: 189 lb (85.7 kg)     Physical Exam   General Appearance:    Alert, cooperative, no distress  Eyes:    PERRL, conjunctiva/corneas clear, EOM's intact       Lungs:     Clear to auscultation bilaterally, respirations unlabored  Heart:     Irregularly irregular rhythm. Normal rate   Neurologic:   Awake, alert, oriented x 3. No apparent focal neurological           defect.   Skin:   Dorsal aspect of right wrist red, swollen and tender near previous IV site.        Assessment & Plan:     1. Upper GI bleed Secondary to adenocarcinoma of esophagus. Nausea resolved, Melena may be entirely due to iron supplements. No on PPI.  - CBC  2. AF (paroxysmal atrial fibrillation) ([place]) He was advised to resume anti coagulation at discharge although he has not yet done so. Discussed risk/benefit of anticoagulation including risk of recurrent GI bleed versus risk of thromboembolic stroke. Will keep ASA on hold but cautiously resume previous dose of warfarin Call if  nausea returns or at sight of any red blood, or any change in bowels. Try to keep INR in lower end of therapeutic range. Advised to get INR within a week since it was elevated when at admission. He is going to contact his cardiologist's office for this.  - INR/PT  3. MRSA nasal colonization Asymptomatic.   4. CAD in native artery  - cephALEXin (KEFLEX) 500 MG capsule; Take 1 capsule (500 mg total) by mouth 4 (four) times daily for 7 days.  Dispense: 28 capsule; Refill: 0  Lelon Huh, MD  [facility]

## 2018-01-12 NOTE — Patient Instructions (Signed)
   Stay off aspirin for the time being   Restart warfarin at 5mg  the first day, then 1/2 tablet each day   Have your PT/INR check in 4-7 days

## 2018-01-13 LAB — CBC
HEMATOCRIT: 27.3 % — AB (ref 37.5–51.0)
HEMOGLOBIN: 9 g/dL — AB (ref 13.0–17.7)
MCH: 31.8 pg (ref 26.6–33.0)
MCHC: 33 g/dL (ref 31.5–35.7)
MCV: 97 fL (ref 79–97)
Platelets: 221 10*3/uL (ref 150–379)
RBC: 2.83 x10E6/uL — ABNORMAL LOW (ref 4.14–5.80)
RDW: 18.3 % — ABNORMAL HIGH (ref 12.3–15.4)
WBC: 7 10*3/uL (ref 3.4–10.8)

## 2018-01-13 LAB — PROTIME-INR
INR: 1.1 (ref 0.8–1.2)
Prothrombin Time: 11.8 s (ref 9.1–12.0)

## 2018-01-14 ENCOUNTER — Encounter: Payer: Self-pay | Admitting: Oncology

## 2018-01-14 ENCOUNTER — Ambulatory Visit (HOSPITAL_COMMUNITY)
Admission: RE | Admit: 2018-01-14 | Discharge: 2018-01-14 | Disposition: A | Discharge status: Home / Self Care | Payer: Medicare Other | Source: Ambulatory Visit | Attending: Oncology | Admitting: Oncology

## 2018-01-14 ENCOUNTER — Encounter: Payer: Self-pay | Admitting: Radiation Oncology

## 2018-01-14 ENCOUNTER — Inpatient Hospital Stay: Payer: Medicare Other | Attending: Oncology | Admitting: Oncology

## 2018-01-14 ENCOUNTER — Telehealth: Payer: Self-pay | Admitting: Oncology

## 2018-01-14 VITALS — BP 101/64 | HR 74 | Temp 97.8°F | Resp 17 | Ht 68.0 in | Wt 189.9 lb

## 2018-01-14 DIAGNOSIS — Z5111 Encounter for antineoplastic chemotherapy: Secondary | ICD-10-CM | POA: Insufficient documentation

## 2018-01-14 DIAGNOSIS — Z87891 Personal history of nicotine dependence: Secondary | ICD-10-CM | POA: Diagnosis not present

## 2018-01-14 DIAGNOSIS — I700 Atherosclerosis of aorta: Secondary | ICD-10-CM | POA: Diagnosis not present

## 2018-01-14 DIAGNOSIS — R911 Solitary pulmonary nodule: Secondary | ICD-10-CM | POA: Diagnosis not present

## 2018-01-14 DIAGNOSIS — C160 Malignant neoplasm of cardia: Secondary | ICD-10-CM | POA: Insufficient documentation

## 2018-01-14 DIAGNOSIS — N200 Calculus of kidney: Secondary | ICD-10-CM | POA: Diagnosis not present

## 2018-01-14 DIAGNOSIS — K222 Esophageal obstruction: Secondary | ICD-10-CM | POA: Diagnosis not present

## 2018-01-14 DIAGNOSIS — J449 Chronic obstructive pulmonary disease, unspecified: Secondary | ICD-10-CM | POA: Insufficient documentation

## 2018-01-14 DIAGNOSIS — R0600 Dyspnea, unspecified: Secondary | ICD-10-CM | POA: Diagnosis not present

## 2018-01-14 DIAGNOSIS — Z7901 Long term (current) use of anticoagulants: Secondary | ICD-10-CM | POA: Diagnosis not present

## 2018-01-14 DIAGNOSIS — I480 Paroxysmal atrial fibrillation: Secondary | ICD-10-CM | POA: Diagnosis not present

## 2018-01-14 DIAGNOSIS — N400 Enlarged prostate without lower urinary tract symptoms: Secondary | ICD-10-CM | POA: Diagnosis not present

## 2018-01-14 DIAGNOSIS — I2510 Atherosclerotic heart disease of native coronary artery without angina pectoris: Secondary | ICD-10-CM | POA: Insufficient documentation

## 2018-01-14 DIAGNOSIS — K4090 Unilateral inguinal hernia, without obstruction or gangrene, not specified as recurrent: Secondary | ICD-10-CM | POA: Diagnosis not present

## 2018-01-14 DIAGNOSIS — Z87442 Personal history of urinary calculi: Secondary | ICD-10-CM | POA: Diagnosis not present

## 2018-01-14 DIAGNOSIS — C159 Malignant neoplasm of esophagus, unspecified: Secondary | ICD-10-CM | POA: Diagnosis not present

## 2018-01-14 DIAGNOSIS — K922 Gastrointestinal hemorrhage, unspecified: Secondary | ICD-10-CM | POA: Diagnosis not present

## 2018-01-14 DIAGNOSIS — J439 Emphysema, unspecified: Secondary | ICD-10-CM | POA: Insufficient documentation

## 2018-01-14 MED ORDER — IOHEXOL 300 MG/ML  SOLN
100.0000 mL | Freq: Once | INTRAMUSCULAR | Status: AC | PRN
  Administered 2018-01-14: 100 mL via INTRAVENOUS

## 2018-01-14 NOTE — Telephone Encounter (Signed)
Scheduled appt per 5/9 los - Gave patient AVS and calender per los.  

## 2018-01-14 NOTE — Progress Notes (Signed)
[place] Patient Consult   Referring MD: Taurus Alamo 77 y.o.  25-Nov-1940    Reason for Referral: Gastroesophageal cancer   HPI: Mr. Minniefield reports developing dark stool for approximately 3 days.  He had associated "weakness "limiting his ability to ambulate.  He developed hematemesis and presented to the emergency room on 01/07/2018.  Hemoglobin returned at 5.8.  He was transfused with packed red blood cells.  He was maintained on Coumadin for atrial fibrillation.  He was given FFP and vitamin K.  Gastroenterology was consulted.  He was taken to an upper endoscopy by Dr. Penelope Coop on 01/07/2018.  A mass was found at the GE junction.  The mass was not obstructing.  Biopsies were obtained.  The pathology [id number]) revealed adenocarcinoma.  He was discharged home 01/09/2018.  He continues to have dark stool.  No recurrent hematemesis.  He saw his primary physician earlier this week and he was placed on cephalexin for treatment of erythema at the dorsum of the right wrist near a previous IV site.  He resumed Coumadin anticoagulation.  Past Medical History:  Diagnosis Date  . Allergy   . CHF (congestive heart failure) ([place])   . Chronic knee pain   . Coronary artery disease    with LAD DE stent 2004  . Heel spur, left   . Hyperlipidemia   . Hypertension   . Kidney stones   . Lower back pain    status post surgery for spondylolisthesis  . Migraine   . Persistent atrial fibrillation ([place])    longstanding persistent (since 05/2008)  . Plantar fasciitis    .   Chronic exertional dyspnea, FEV1 1.92 on 11/06/2016  Past Surgical History:  Procedure Laterality Date  . BACK SURGERY     spondylolisthesis  . CARDIAC CATHETERIZATION  03/06/2003  . CORONARY ANGIOPLASTY WITH STENT PLACEMENT  04/2003   CYPHER stent implantation in the LAD  . ESOPHAGOGASTRODUODENOSCOPY (EGD) WITH PROPOFOL N/A 01/07/2018   Procedure: ESOPHAGOGASTRODUODENOSCOPY (EGD) WITH  PROPOFOL;  Surgeon: Wonda Horner, MD;  Location: [facility] ENDOSCOPY;  Service: Endoscopy;  Laterality: N/A;  . HAND SURGERY Right 2014   carpal tunnel  . SKIN CANCER EXCISION  1980's   Face  . VASCULAR SURGERY     Vascular Stent    Medications: Reviewed  Allergies:  Allergies  Allergen Reactions  . Simvastatin Other (See Comments)    Leg pain  . Lisinopril Cough  . Celecoxib Rash    Family history: His father had colon cancer.  His mother died of lung cancer metastatic to the brain, a brother had bladder cancer, a brother had lung cancer and was a smoker.  He has 1 daughter.  Social History:   He lives in [place] with his wife.  He is retired Insurance underwriter.  He quit smoking cigarettes in 1991.  No alcohol use.  No transfusion history prior to the recent hospital admission.  No risk factor for HIV or hepatitis.  ROS:   Positives include: Exertional dyspnea, black stools beginning 3 to 4 days prior to hospital admission 01/07/2018, arthritis in the shoulders, knees, and wrist, erythema at the dorsum of the right wrist 01/10/2018  A complete ROS was otherwise negative.  Physical Exam:  Blood pressure 101/64, pulse 74, temperature 97.8 F (36.6 C), temperature source Oral, resp. rate 17, height 5' 8"  (1.727 m), weight 189 lb 14.4 oz (86.1 kg), SpO2 100 %.  HEENT: Oropharynx without visible mass, neck  without mass Lungs: Scattered end inspiratory rhonchi at the lower posterior chest bilaterally, no respiratory distress Cardiac: Irregular Abdomen: No hepatosplenomegaly, no mass, nontender GU: Testes without mass Vascular: Trace pitting edema at the lower leg and ankle bilaterally Lymph nodes: No cervical, supraclavicular, axillary, or inguinal nodes Neurologic: Alert and oriented, the motor exam appears intact in the upper and lower extremities Skin: Erythema/induration at the dorsum of the right wrist Musculoskeletal: No spine tenderness   LAB:  CBC  Lab Results  Component Value  Date   WBC 7.0 01/12/2018   HGB 9.0 (L) 01/12/2018   HCT 27.3 (L) 01/12/2018   MCV 97 01/12/2018   PLT 221 01/12/2018   NEUTROABS 8.6 (H) 01/07/2018        CMP     Component Value Date/Time   NA 144 01/07/2018 0235   NA 142 12/11/2017 1128   K 4.3 01/07/2018 0235   CL 110 01/07/2018 0235   CO2 23 01/07/2018 0225   GLUCOSE 139 (H) 01/07/2018 0235   BUN 32 (H) 01/07/2018 0235   BUN 18 12/11/2017 1128   CREATININE 0.80 01/07/2018 0235   CALCIUM 7.9 (L) 01/07/2018 0225   PROT 3.9 (L) 01/07/2018 0225   PROT 7.1 12/11/2017 1128   ALBUMIN 2.4 (L) 01/07/2018 0225   ALBUMIN 4.6 12/11/2017 1128   AST 24 01/07/2018 0225   ALT 29 01/07/2018 0225   ALKPHOS 28 (L) 01/07/2018 0225   BILITOT 0.5 01/07/2018 0225   BILITOT 0.7 12/11/2017 1128   GFRNONAA >60 01/07/2018 0225   GFRAA >60 01/07/2018 0225     Assessment/Plan:   1. Adenocarcinoma of the GE junction  Partially obstructing mass noted at the GE junction on endoscopy 01/07/2018, nonobstructing 2. GI bleeding secondary to #1 3. Atrial fibrillation-maintained on Coumadin prior to hospital admission 01/07/2018 4. Coronary artery disease 5. Chronic exertional dyspnea-likely COPD 6. History of tobacco use 7. History of kidney stones   Disposition:   Mr. Delacruz has been diagnosed with adenocarcinoma of the GE junction.  There is no clinical evidence of distant metastatic disease.  He will be referred for staging CTs today.  I discussed the treatment options for localized gastroesophageal cancer with Mr. Swaim and his wife.  If he has localized disease we will make a referral to Dr. Servando Snare to see whether he is a candidate for surgery.  If he is not a surgical candidate I will recommend concurrent Taxol/carboplatin and radiation.  He will return for an office visit and further discussion on 01/19/2018.  We will request HER-2/neu testing on the esophagus biopsy.  He will be referred to the genetics counselor given his personal  and family history of multiple cancers.  I recommended he discontinue Coumadin anticoagulation secondary to the recent admission with GI bleeding and severe anemia.  50 minutes were spent with the patient today.  The majority of the time was used for counseling and coordination of care.  Betsy Coder, MD  01/14/2018, 2:34 PM

## 2018-01-14 NOTE — Progress Notes (Signed)
  Oncology Nurse Navigator Documentation  Navigator Location: [facility] (01/14/18 1530)   )Navigator Encounter Type: Initial MedOnc (01/14/18 1530) Met with patient and wife during initial consult.  Contact information provided. Information on different members of the treatment team explained. Ct CAP scheduled for same day and CT contrast provided. I walked patient and wife to Radiology. No barriers to treatment identified.                                          Acuity: Level 3 (01/14/18 1530)         Time Spent with Patient: 75 (01/14/18 1530)

## 2018-01-15 DIAGNOSIS — C160 Malignant neoplasm of cardia: Secondary | ICD-10-CM

## 2018-01-18 ENCOUNTER — Other Ambulatory Visit: Payer: Medicare Other

## 2018-01-18 ENCOUNTER — Encounter: Payer: Medicare Other | Admitting: Nutrition

## 2018-01-18 NOTE — Progress Notes (Signed)
GI Location of Tumor / Histology: Gastroesophageal Cancer  Larry Bautista presented approximately 3 days of dark stools dizziness, and weakness-limiting his ability to ambulate.  Recent ED admission on 01/07/2018 after having some hematemesis, hemoglobin at that time was 5.8 and he was given PRBC.  CT Chest/Abd/Pelvis 01/14/2018: 1. Mild eccentric wall thickening posteriorly at the esophagogastric junction, without a discrete measurable mass by CT, compatible with the provided history of esophagogastric junction malignancy. 2. No locoregional adenopathy. No definite distant metastatic disease. 3. Solitary subsolid 5 mm right upper lobe pulmonary nodule.  Recommend attention on follow-up chest CT in 3-6 months.  Upper Endoscopy 01/07/2018 by Dr. Penelope Coop: Mass found at the GE junction, non obstructing.  Biopsies of  Esophagogastric Junction 01/07/2018    Past/Anticipated interventions by surgeon, if any:  Appointment with Dr. Servando Snare on 01/21/2018 at 10 am  Past/Anticipated interventions by medical oncology, if any:  Dr. Benay Spice 01/14/2018 I discussed the treatment options for localized gastroesophageal cancer with Mr. Linker and his wife.  If he has localized disease we will make a referral to Dr. Servando Snare to see whether he is a candidate for surgery.  If he is not a surgical candidate I will recommend concurrent Taxol/carboplatin and radiation.  He will return for an office visit and further discussion on 01/19/2018.  We will request HER-2/neu testing on the esophagus biopsy.  He will be referred to the genetics counselor given his personal and family history of multiple cancers.  I recommended he discontinue Coumadin anticoagulation secondary to the recent admission with GI bleeding and severe anemia.   Weight changes, if any: No  Bowel/Bladder complaints, if any: Having some constipation, stools still appears blackened, he is also taking iron, and metamucil on occassion..  Nausea /  Vomiting, if any: No  Pain issues, if any: arthritis, knees.   No pain associated with swallowing or eating.  Any blood per rectum:  Bowels appear blackened, no red blood noted.  BP 133/78 (BP Location: Right Arm, Patient Position: Sitting, Cuff Size: Normal)   Pulse (!) 58   Temp 97.8 F (36.6 C) (Oral)   Resp 20   Ht 5' 8"  (1.727 m)   Wt 193 lb 3.2 oz (87.6 kg)   SpO2 99%   BMI 29.38 kg/m    Wt Readings from Last 3 Encounters:  01/19/18 193 lb 3.2 oz (87.6 kg)  01/19/18 193 lb 1.6 oz (87.6 kg)  01/14/18 189 lb 14.4 oz (86.1 kg)     SAFETY ISSUES:  Prior radiation? No  Pacemaker/ICD? No  Possible current pregnancy? No Is the patient on methotrexate? No  Current Complaints/Details:

## 2018-01-19 ENCOUNTER — Inpatient Hospital Stay: Payer: Medicare Other

## 2018-01-19 ENCOUNTER — Other Ambulatory Visit: Payer: Self-pay

## 2018-01-19 ENCOUNTER — Ambulatory Visit: Payer: Medicare Other | Admitting: Nutrition

## 2018-01-19 ENCOUNTER — Encounter: Payer: Self-pay | Admitting: *Deleted

## 2018-01-19 ENCOUNTER — Ambulatory Visit
Admission: RE | Admit: 2018-01-19 | Discharge: 2018-01-19 | Disposition: A | Discharge status: Home / Self Care | Payer: Medicare Other | Source: Ambulatory Visit | Attending: Radiation Oncology | Admitting: Radiation Oncology

## 2018-01-19 ENCOUNTER — Telehealth: Payer: Self-pay | Admitting: Oncology

## 2018-01-19 ENCOUNTER — Inpatient Hospital Stay (HOSPITAL_BASED_OUTPATIENT_CLINIC_OR_DEPARTMENT_OTHER): Payer: Medicare Other | Admitting: Oncology

## 2018-01-19 ENCOUNTER — Other Ambulatory Visit: Payer: Self-pay | Admitting: *Deleted

## 2018-01-19 VITALS — BP 133/78 | HR 58 | Temp 97.8°F | Resp 20 | Ht 68.0 in | Wt 193.2 lb

## 2018-01-19 VITALS — BP 127/96 | HR 73 | Temp 97.6°F | Resp 18 | Ht 68.0 in | Wt 193.1 lb

## 2018-01-19 DIAGNOSIS — I4891 Unspecified atrial fibrillation: Secondary | ICD-10-CM | POA: Insufficient documentation

## 2018-01-19 DIAGNOSIS — C160 Malignant neoplasm of cardia: Secondary | ICD-10-CM | POA: Diagnosis not present

## 2018-01-19 DIAGNOSIS — I10 Essential (primary) hypertension: Secondary | ICD-10-CM | POA: Insufficient documentation

## 2018-01-19 DIAGNOSIS — K4090 Unilateral inguinal hernia, without obstruction or gangrene, not specified as recurrent: Secondary | ICD-10-CM | POA: Insufficient documentation

## 2018-01-19 DIAGNOSIS — Z79899 Other long term (current) drug therapy: Secondary | ICD-10-CM | POA: Diagnosis not present

## 2018-01-19 DIAGNOSIS — E785 Hyperlipidemia, unspecified: Secondary | ICD-10-CM | POA: Diagnosis not present

## 2018-01-19 DIAGNOSIS — K922 Gastrointestinal hemorrhage, unspecified: Secondary | ICD-10-CM

## 2018-01-19 DIAGNOSIS — Z87891 Personal history of nicotine dependence: Secondary | ICD-10-CM | POA: Diagnosis not present

## 2018-01-19 DIAGNOSIS — Z7984 Long term (current) use of oral hypoglycemic drugs: Secondary | ICD-10-CM | POA: Diagnosis not present

## 2018-01-19 DIAGNOSIS — I700 Atherosclerosis of aorta: Secondary | ICD-10-CM | POA: Diagnosis not present

## 2018-01-19 DIAGNOSIS — N281 Cyst of kidney, acquired: Secondary | ICD-10-CM | POA: Insufficient documentation

## 2018-01-19 DIAGNOSIS — I480 Paroxysmal atrial fibrillation: Secondary | ICD-10-CM | POA: Diagnosis not present

## 2018-01-19 DIAGNOSIS — Z800 Family history of malignant neoplasm of digestive organs: Secondary | ICD-10-CM | POA: Insufficient documentation

## 2018-01-19 DIAGNOSIS — I2510 Atherosclerotic heart disease of native coronary artery without angina pectoris: Secondary | ICD-10-CM | POA: Diagnosis not present

## 2018-01-19 DIAGNOSIS — M19011 Primary osteoarthritis, right shoulder: Secondary | ICD-10-CM | POA: Insufficient documentation

## 2018-01-19 DIAGNOSIS — Z87442 Personal history of urinary calculi: Secondary | ICD-10-CM | POA: Insufficient documentation

## 2018-01-19 DIAGNOSIS — I509 Heart failure, unspecified: Secondary | ICD-10-CM | POA: Diagnosis not present

## 2018-01-19 DIAGNOSIS — R911 Solitary pulmonary nodule: Secondary | ICD-10-CM | POA: Diagnosis not present

## 2018-01-19 DIAGNOSIS — J439 Emphysema, unspecified: Secondary | ICD-10-CM | POA: Insufficient documentation

## 2018-01-19 DIAGNOSIS — R51 Headache: Secondary | ICD-10-CM | POA: Insufficient documentation

## 2018-01-19 DIAGNOSIS — Z7901 Long term (current) use of anticoagulants: Secondary | ICD-10-CM | POA: Diagnosis not present

## 2018-01-19 LAB — CBC WITH DIFFERENTIAL (CANCER CENTER ONLY)
BASOS ABS: 0.1 10*3/uL (ref 0.0–0.1)
Basophils Relative: 1 %
Eosinophils Absolute: 0.2 10*3/uL (ref 0.0–0.5)
Eosinophils Relative: 3 %
HEMATOCRIT: 30.7 % — AB (ref 38.4–49.9)
Hemoglobin: 10 g/dL — ABNORMAL LOW (ref 13.0–17.1)
LYMPHS ABS: 1.2 10*3/uL (ref 0.9–3.3)
LYMPHS PCT: 16 %
MCH: 32.4 pg (ref 27.2–33.4)
MCHC: 32.8 g/dL (ref 32.0–36.0)
MCV: 98.9 fL — AB (ref 79.3–98.0)
MONO ABS: 0.8 10*3/uL (ref 0.1–0.9)
Monocytes Relative: 11 %
NEUTROS ABS: 5 10*3/uL (ref 1.5–6.5)
Neutrophils Relative %: 69 %
Platelet Count: 346 10*3/uL (ref 140–400)
RBC: 3.1 MIL/uL — AB (ref 4.20–5.82)
RDW: 19.5 % — ABNORMAL HIGH (ref 11.0–14.6)
WBC Count: 7.3 10*3/uL (ref 4.0–10.3)

## 2018-01-19 LAB — CMP (CANCER CENTER ONLY)
ALBUMIN: 3.2 g/dL — AB (ref 3.5–5.0)
ALK PHOS: 49 U/L (ref 40–150)
ALT: 20 U/L (ref 0–55)
AST: 20 U/L (ref 5–34)
Anion gap: 5 (ref 3–11)
BUN: 11 mg/dL (ref 7–26)
CALCIUM: 8.8 mg/dL (ref 8.4–10.4)
CHLORIDE: 110 mmol/L — AB (ref 98–109)
CO2: 27 mmol/L (ref 22–29)
CREATININE: 0.82 mg/dL (ref 0.70–1.30)
GFR, Estimated: >60 mL/min (ref >60)
GLUCOSE: 85 mg/dL (ref 70–140)
Potassium: 4.3 mmol/L (ref 3.5–5.1)
Sodium: 142 mmol/L (ref 136–145)
Total Bilirubin: 0.4 mg/dL (ref 0.2–1.2)
Total Protein: 5.7 g/dL — ABNORMAL LOW (ref 6.4–8.3)

## 2018-01-19 LAB — CEA (IN HOUSE-CHCC)

## 2018-01-19 LAB — SAMPLE TO BLOOD BANK

## 2018-01-19 LAB — RESEARCH LABS

## 2018-01-19 MED ORDER — PROCHLORPERAZINE MALEATE 5 MG PO TABS: 5.0000 mg | ORAL_TABLET | Freq: Four times a day (QID) | ORAL | 0 refills | Status: DC | PRN

## 2018-01-19 MED ORDER — DEXAMETHASONE 2 MG PO TABS: ORAL_TABLET | ORAL | 0 refills | Status: DC

## 2018-01-19 NOTE — Addendum Note (Signed)
Addended by: Brien Few on: 01/19/2018 02:54 PM   Modules accepted: Orders

## 2018-01-19 NOTE — Progress Notes (Signed)
Spoke with Cherlyn Cushing in pathology. Requested Her2 testing on [id number].

## 2018-01-19 NOTE — Telephone Encounter (Signed)
Scheduled appt per 5/14 0- unable to schedule appt for 5/29 due to capped day - logged - will contact patient when scheduled.

## 2018-01-19 NOTE — Progress Notes (Signed)
77 year old male diagnosed with esophageal mass at GE junction.  He is a patient of Dr. Benay Spice and Dr. Lisbeth Renshaw.  Past medical history includes CAD and atrial fib.  Medications include ferrous sulfate, omega-3 fatty acids, multivitamin, Prilosec, prednisone, vitamin C, and Coumadin.  Labs were reviewed.  Height: 68 inches. Weight: 193.1 pounds May 14. Usual body weight: 196 pounds March 7. BMI: 29.36.  Patient reports he will be receiving concurrent chemoradiation therapy. Patient denies problems chewing or swallowing at this time. He also denies nausea, vomiting, constipation, and diarrhea. Reports last bowel movement was today.  Nutrition diagnosis:  Food and nutrition related knowledge deficit related to esophageal mass as evidenced by no prior need for nutrition related information.  Intervention: Patient educated to consume small frequent meals and snacks with high-calorie high-protein foods. Provided fact sheets. Recommended patient try oral nutrition supplements and provided coupons. Suggested patient may want to try these if his oral intake decreases. Questions were answered.  Teach back method used.  Contact information provided.  Monitoring, evaluation, goals: Patient will tolerate adequate calories and protein to minimize weight loss during treatment.  Next visit: Wednesday, June 5 during infusion.  **Disclaimer: This note was dictated with voice recognition software. Similar sounding words can inadvertently be transcribed and this note may contain transcription errors which may not have been corrected upon publication of note.**

## 2018-01-19 NOTE — Progress Notes (Addendum)
Radiation Oncology         [phone] ________________________________  Name: Larry Bautista        MRN: [id number]  Date of Service: 01/19/2018 DOB: Dec 23, 1940  AX:[id number], Kirstie Peri, MD  Ladell Pier, MD     REFERRING PHYSICIAN: Ladell Pier, MD   DIAGNOSIS: The encounter diagnosis was Gastroesophageal cancer [facility]).   HISTORY OF PRESENT ILLNESS: Larry Bautista is a 77 y.o. male seen at the request of Dr. Benay Spice with a new diagnosis of adenocarcinoma of the GE junction. The patient was noted to have SOB and hematemesis which prompted ED evaluation on 01/07/18. He underwent endoscopy and was given blood products after his hgb was found to be 5.8. His endoscopy with Dr. Penelope Coop on 01/07/18 revealed a medium size lesion at the GE junction, but did not obstruct, and was not circumferential. A biopsy of this revealed adenocarcinoma. He did have staging CT c/a/p which did not reveal any adenopathy in the abdomen or chest, or concerns for metastatic disease. He has met with Dr. Learta Codding and is being set up for PET scan, and will also be seeing Dr. Servando Snare for discussion on surgical options and he will see Dr. Servando Snare on Thursday this week. He comes today to discuss the options of chemoRT if he is not a candidate for primary surgical resection if he is a candidate.     PREVIOUS RADIATION THERAPY: No   PAST MEDICAL HISTORY:  Past Medical History:  Diagnosis Date  . Allergy   . CHF (congestive heart failure) ([place])   . Chronic knee pain   . Coronary artery disease    with LAD DE stent 2004  . Heel spur, left   . Hyperlipidemia   . Hypertension   . Kidney stones   . Lower back pain    status post surgery for spondylolisthesis  . Migraine   . Persistent atrial fibrillation ([place])    longstanding persistent (since 05/2008)  . Plantar fasciitis        PAST SURGICAL HISTORY: Past Surgical History:  Procedure Laterality Date  . BACK SURGERY     spondylolisthesis  . CARDIAC  CATHETERIZATION  03/06/2003  . CORONARY ANGIOPLASTY WITH STENT PLACEMENT  04/2003   CYPHER stent implantation in the LAD  . ESOPHAGOGASTRODUODENOSCOPY (EGD) WITH PROPOFOL N/A 01/07/2018   Procedure: ESOPHAGOGASTRODUODENOSCOPY (EGD) WITH PROPOFOL;  Surgeon: Wonda Horner, MD;  Location: [facility] ENDOSCOPY;  Service: Endoscopy;  Laterality: N/A;  . HAND SURGERY Right 2014   carpal tunnel  . SKIN CANCER EXCISION  1980's   Face  . VASCULAR SURGERY     Vascular Stent     FAMILY HISTORY:  Family History  Problem Relation Age of Onset  . Heart attack Father   . Colon cancer Father   . Congestive Heart Failure Father   . Atrial fibrillation Brother   . Stroke Brother   . Bladder Cancer Brother   . Lung cancer Mother   . Atrial fibrillation Brother   . COPD Brother   . Congestive Heart Failure Brother   . Lung cancer Brother   . Migraines Daughter      SOCIAL HISTORY:  reports that he quit smoking about 28 years ago. His smoking use included cigarettes. He started smoking about 63 years ago. He has a 70.00 pack-year smoking history. He has never used smokeless tobacco. He reports that he drank about 1.2 oz of alcohol per week. He reports that he does not use drugs.  The patient is a retired Insurance underwriter.    ALLERGIES: Simvastatin; Lisinopril; and Celecoxib   MEDICATIONS:  Current Outpatient Medications  Medication Sig Dispense Refill  . albuterol (PROAIR HFA) 108 (90 Base) MCG/ACT inhaler Inhale 1-2 puffs into the lungs every 6 (six) hours as needed for wheezing or shortness of breath. 1 Inhaler prn  . cephALEXin (KEFLEX) 500 MG capsule Take 1 capsule (500 mg total) by mouth 4 (four) times daily for 7 days. 28 capsule 0  . cetirizine (ZYRTEC) 10 MG tablet Take 10 mg by mouth daily.    . diphenhydramine-acetaminophen (TYLENOL PM) 25-500 MG TABS Take 1 tablet by mouth at bedtime as needed (for sleep).     [name] eplerenone (INSPRA) 25 MG tablet Take 1 tablet (25 mg total) by mouth daily. Please keep  5/31 appointment for additional refills thanks. 30 tablet 1  . ferrous sulfate 325 (65 FE) MG tablet Take 1 tablet (325 mg total) by mouth 2 (two) times daily with a meal.    . fluticasone (FLONASE) 50 MCG/ACT nasal spray USE 2 SPRAYS IN EACH NOSTRIL DAILY 48 g 3  . gabapentin (NEURONTIN) 300 MG capsule Take 300 mg by mouth every other day.     . metoprolol succinate (TOPROL-XL) 25 MG 24 hr tablet Take 1 tablet (25 mg total) by mouth daily. Please keep 5/31 appointment for additional refills thanks. 30 tablet 1  . omeprazole (PRILOSEC) 20 MG capsule Take 1 capsule (20 mg total) by mouth daily. 30 capsule 0  . prochlorperazine (COMPAZINE) 5 MG tablet Take 1 tablet (5 mg total) by mouth every 6 (six) hours as needed for nausea or vomiting. 30 tablet 0  . psyllium (METAMUCIL) 58.6 % powder Take 1 packet by mouth daily.     . tamsulosin (FLOMAX) 0.4 MG CAPS capsule Take 0.4 mg by mouth daily after supper.    . vitamin C (ASCORBIC ACID) 500 MG tablet Take 500 mg by mouth daily.    [name] dexamethasone (DECADRON) 2 MG tablet Take 5 tablets (24m) PO at 10 PM the night before first chemo. Take 5 tablets (136m 6 AM the morning of first chemo. (Patient not taking: Reported on 01/19/2018) 10 tablet 0  . fish oil-omega-3 fatty acids 1000 MG capsule Take 2 capsules by mouth daily.     . multivitamin (THERAGRAN) per tablet Take 1 tablet by mouth daily.    . nitroGLYCERIN (NITROSTAT) 0.4 MG SL tablet Place 0.4 mg under the tongue every 5 (five) minutes as needed.    . pravastatin (PRAVACHOL) 80 MG tablet Take 1 tablet (80 mg total) by mouth daily. Please keep 5/31 appointment for additional refills thanks. (Patient not taking: Reported on 01/14/2018) 30 tablet 1  . warfarin (COUMADIN) 2.5 MG tablet Take 2.5 mg by mouth daily.    . Marland Kitchenarfarin (COUMADIN) 5 MG tablet TAKE ONE TABLET DAILY AS DIRECTED (Patient not taking: Reported on 01/19/2018) 90 tablet 0   No current facility-administered medications for this encounter.        REVIEW OF SYSTEMS: On review of systems, the patient reports that he is doing well overall since he received blood products. He continues to have dark tarry stools when he eats solid food, and this resolves with liquids. He is able to eat well though, and  denies any dysphagia, chest pain, shortness of breath, cough, fevers, chills, night sweats, unintended weight changes. He denies any bowel or bladder disturbances, and denies abdominal pain, nausea or vomiting. He denies any new musculoskeletal or  joint aches or pains. A complete review of systems is obtained and is otherwise negative.     PHYSICAL EXAM:  Wt Readings from Last 3 Encounters:  01/19/18 193 lb 3.2 oz (87.6 kg)  01/19/18 193 lb 1.6 oz (87.6 kg)  01/14/18 189 lb 14.4 oz (86.1 kg)   Temp Readings from Last 3 Encounters:  01/19/18 97.8 F (36.6 C) (Oral)  01/19/18 97.6 F (36.4 C) (Oral)  01/14/18 97.8 F (36.6 C) (Oral)   BP Readings from Last 3 Encounters:  01/19/18 133/78  01/19/18 (!) 127/96  01/14/18 101/64   Pulse Readings from Last 3 Encounters:  01/19/18 (!) 58  01/19/18 73  01/14/18 74   Pain Assessment Pain Score: 0-No pain/10  In general this is a well appearing caucasian male in no acute distress. He is alert and oriented x4 and appropriate throughout the examination. HEENT reveals that the patient is normocephalic, atraumatic. EOMs are intact. PERRLA. Skin is intact without any evidence of gross lesions. Cardiovascular exam reveals a regular rate and rhythm, no clicks rubs or murmurs are auscultated. Chest is clear to auscultation bilaterally. Lymphatic assessment is performed and does not reveal any adenopathy in the cervical, supraclavicular, axillary, or inguinal chains. Abdomen has active bowel sounds in all quadrants and is intact. The abdomen is soft, non tender, non distended. Lower extremities are negative for pretibial pitting edema, deep calf tenderness, cyanosis or clubbing.   ECOG =  1  0 - Asymptomatic (Fully active, able to carry on all predisease activities without restriction)  1 - Symptomatic but completely ambulatory (Restricted in physically strenuous activity but ambulatory and able to carry out work of a light or sedentary nature. For example, light housework, office work)  2 - Symptomatic, <50% in bed during the day (Ambulatory and capable of all self care but unable to carry out any work activities. Up and about more than 50% of waking hours)  3 - Symptomatic, >50% in bed, but not bedbound (Capable of only limited self-care, confined to bed or chair 50% or more of waking hours)  4 - Bedbound (Completely disabled. Cannot carry on any self-care. Totally confined to bed or chair)  5 - Death   Eustace Pen MM, Creech RH, Tormey DC, et al. [phone]). "Toxicity and response criteria of the [facility] Group". [place] Oncol. 5 (6): 649-55    LABORATORY DATA:  Lab Results  Component Value Date   WBC 7.3 01/19/2018   HGB 10.0 (L) 01/19/2018   HCT 30.7 (L) 01/19/2018   MCV 98.9 (H) 01/19/2018   PLT 346 01/19/2018   Lab Results  Component Value Date   NA 142 01/19/2018   K 4.3 01/19/2018   CL 110 (H) 01/19/2018   CO2 27 01/19/2018   Lab Results  Component Value Date   ALT 20 01/19/2018   AST 20 01/19/2018   ALKPHOS 49 01/19/2018   BILITOT 0.4 01/19/2018      RADIOGRAPHY: Ct Chest W Contrast  Result Date: 01/15/2018 CLINICAL DATA:  New diagnosis of nonobstructing gastroesophageal junction adenocarcinoma on 01/07/2018 upper endoscopy. EXAM: CT CHEST, ABDOMEN, AND PELVIS WITH CONTRAST TECHNIQUE: Multidetector CT imaging of the chest, abdomen and pelvis was performed following the standard protocol during bolus administration of intravenous contrast. CONTRAST:  188m OMNIPAQUE IOHEXOL 300 MG/ML  SOLN COMPARISON:  12/08/2017 chest radiograph. 07/11/2015 CT abdomen/pelvis. FINDINGS: CT CHEST FINDINGS Cardiovascular: Normal heart size. No  significant pericardial effusion/thickening. Left main and 3 vessel coronary atherosclerosis. Atherosclerotic nonaneurysmal thoracic  aorta. Normal caliber pulmonary arteries. No central pulmonary emboli. Mediastinum/Nodes: No discrete thyroid nodules. There is mild eccentric wall thickening posteriorly at the esophagogastric junction (series 2/image 52), without a discrete measurable mass by CT. No pathologically enlarged axillary, mediastinal or hilar lymph nodes. Lungs/Pleura: No pneumothorax. No pleural effusion. Moderate centrilobular emphysema with diffuse bronchial wall thickening. Sub solid right upper lobe 5 mm pulmonary nodule (series 4/image 36). No acute consolidative airspace disease, lung masses or additional significant pulmonary nodules. A few scattered small parenchymal bands at the lung bases, compatible with mild postinfectious/postinflammatory scarring. Musculoskeletal: No aggressive appearing focal osseous lesions. Right shoulder joint effusion/bursal collection anterior to the right scapula (series 2/image 6). Asymmetric advanced right shoulder osteoarthritis. Marked thoracic spondylosis. CT ABDOMEN PELVIS FINDINGS Hepatobiliary: Normal liver size. Subcentimeter hypodense left liver lobe lesions are too small to characterize and are stable since 07/11/2015 CT, considered benign. No new liver lesions. Normal gallbladder with no radiopaque cholelithiasis. No biliary ductal dilatation. Pancreas: Normal, with no mass or duct dilation. Spleen: Normal size. No mass. Adrenals/Urinary Tract: Normal adrenals. No hydronephrosis. Nonobstructing 5 mm lower right renal stone. Small simple renal cysts in both kidneys, largest 2.9 cm in the lateral upper left kidney. Additional subcentimeter hypodense renal cortical lesions in both kidneys are too small to characterize and require no follow-up. Normal bladder. Stomach/Bowel: Mild eccentric wall thickening posteriorly at the esophagogastric junction (series  2/image 52), without a discrete measurable mass by CT. Otherwise normal stomach. Normal caliber small bowel with no small bowel wall thickening. Normal appendix. Normal large bowel with no diverticulosis, large bowel wall thickening or pericolonic fat stranding. Vascular/Lymphatic: Atherosclerotic nonaneurysmal abdominal aorta. Patent portal, splenic, hepatic and renal veins. No pathologically enlarged lymph nodes in the abdomen or pelvis. Reproductive: Mildly enlarged prostate with nonspecific internal prostatic calcifications. Other: No pneumoperitoneum, ascites or focal fluid collection. Small fat containing left inguinal hernia, stable. Musculoskeletal: No aggressive appearing focal osseous lesions. Marked lumbar spondylosis. Postsurgical changes from bilateral posterior spinal fusion at L5-S1. IMPRESSION: 1. Mild eccentric wall thickening posteriorly at the esophagogastric junction, without a discrete measurable mass by CT, compatible with the provided history of esophagogastric junction malignancy. 2. No locoregional adenopathy. No definite distant metastatic disease. 3. Solitary subsolid 5 mm right upper lobe pulmonary nodule. Recommend attention on follow-up chest CT in 3-6 months. 4. Chronic findings include: Aortic Atherosclerosis (ICD10-I70.0) and Emphysema (ICD10-J43.9). Left main and 3 vessel coronary atherosclerosis. Nonobstructing right nephrolithiasis. Mildly enlarged prostate. Stable small fat containing left inguinal hernia. Electronically Signed   By: Ilona Sorrel M.D.   On: 01/15/2018 16:34   Ct Abdomen Pelvis W Contrast  Result Date: 01/15/2018 CLINICAL DATA:  New diagnosis of nonobstructing gastroesophageal junction adenocarcinoma on 01/07/2018 upper endoscopy. EXAM: CT CHEST, ABDOMEN, AND PELVIS WITH CONTRAST TECHNIQUE: Multidetector CT imaging of the chest, abdomen and pelvis was performed following the standard protocol during bolus administration of intravenous contrast. CONTRAST:   125m OMNIPAQUE IOHEXOL 300 MG/ML  SOLN COMPARISON:  12/08/2017 chest radiograph. 07/11/2015 CT abdomen/pelvis. FINDINGS: CT CHEST FINDINGS Cardiovascular: Normal heart size. No significant pericardial effusion/thickening. Left main and 3 vessel coronary atherosclerosis. Atherosclerotic nonaneurysmal thoracic aorta. Normal caliber pulmonary arteries. No central pulmonary emboli. Mediastinum/Nodes: No discrete thyroid nodules. There is mild eccentric wall thickening posteriorly at the esophagogastric junction (series 2/image 52), without a discrete measurable mass by CT. No pathologically enlarged axillary, mediastinal or hilar lymph nodes. Lungs/Pleura: No pneumothorax. No pleural effusion. Moderate centrilobular emphysema with diffuse bronchial wall thickening. Sub solid right upper lobe  5 mm pulmonary nodule (series 4/image 36). No acute consolidative airspace disease, lung masses or additional significant pulmonary nodules. A few scattered small parenchymal bands at the lung bases, compatible with mild postinfectious/postinflammatory scarring. Musculoskeletal: No aggressive appearing focal osseous lesions. Right shoulder joint effusion/bursal collection anterior to the right scapula (series 2/image 6). Asymmetric advanced right shoulder osteoarthritis. Marked thoracic spondylosis. CT ABDOMEN PELVIS FINDINGS Hepatobiliary: Normal liver size. Subcentimeter hypodense left liver lobe lesions are too small to characterize and are stable since 07/11/2015 CT, considered benign. No new liver lesions. Normal gallbladder with no radiopaque cholelithiasis. No biliary ductal dilatation. Pancreas: Normal, with no mass or duct dilation. Spleen: Normal size. No mass. Adrenals/Urinary Tract: Normal adrenals. No hydronephrosis. Nonobstructing 5 mm lower right renal stone. Small simple renal cysts in both kidneys, largest 2.9 cm in the lateral upper left kidney. Additional subcentimeter hypodense renal cortical lesions in both  kidneys are too small to characterize and require no follow-up. Normal bladder. Stomach/Bowel: Mild eccentric wall thickening posteriorly at the esophagogastric junction (series 2/image 52), without a discrete measurable mass by CT. Otherwise normal stomach. Normal caliber small bowel with no small bowel wall thickening. Normal appendix. Normal large bowel with no diverticulosis, large bowel wall thickening or pericolonic fat stranding. Vascular/Lymphatic: Atherosclerotic nonaneurysmal abdominal aorta. Patent portal, splenic, hepatic and renal veins. No pathologically enlarged lymph nodes in the abdomen or pelvis. Reproductive: Mildly enlarged prostate with nonspecific internal prostatic calcifications. Other: No pneumoperitoneum, ascites or focal fluid collection. Small fat containing left inguinal hernia, stable. Musculoskeletal: No aggressive appearing focal osseous lesions. Marked lumbar spondylosis. Postsurgical changes from bilateral posterior spinal fusion at L5-S1. IMPRESSION: 1. Mild eccentric wall thickening posteriorly at the esophagogastric junction, without a discrete measurable mass by CT, compatible with the provided history of esophagogastric junction malignancy. 2. No locoregional adenopathy. No definite distant metastatic disease. 3. Solitary subsolid 5 mm right upper lobe pulmonary nodule. Recommend attention on follow-up chest CT in 3-6 months. 4. Chronic findings include: Aortic Atherosclerosis (ICD10-I70.0) and Emphysema (ICD10-J43.9). Left main and 3 vessel coronary atherosclerosis. Nonobstructing right nephrolithiasis. Mildly enlarged prostate. Stable small fat containing left inguinal hernia. Electronically Signed   By: Ilona Sorrel M.D.   On: 01/15/2018 16:34       IMPRESSION/PLAN: 1. Adenocarcinoma of the GE junction. Dr. Lisbeth Renshaw discusses the pathology findings and reviews the nature of adenocarcinoma of the esophagus/GE junction. Dr. Lisbeth Renshaw recommends proceeding with PET imaging and to  proceed in this manner would allow [place] to better stage his disease, and determine a role for surgical resection when he sees Dr. Servando Snare on Thursday. If he is a surgical candidate, and his PET is negative, it may be beneficial to proceed with surgery, he may need EUS. We discussed the risks, benefits, short, and long term effects of radiotherapy, and the patient is interested in proceeding. Dr. Lisbeth Renshaw discusses the delivery and logistics of radiotherapy and anticipates a course of 5 1/2 weeks of radiotherapy if he is not planning primary resection. Written consent is obtained and placed in the chart, a copy was provided to the patient.    The above documentation reflects my direct findings during this shared patient visit. Please see the separate note by Dr. Lisbeth Renshaw on this date for the remainder of the patient's plan of care.    Carola Rhine, PAC

## 2018-01-19 NOTE — Progress Notes (Signed)
[place] OFFICE PROGRESS NOTE   Diagnosis: GE junction carcinoma  INTERVAL HISTORY:   [name] returns as scheduled.  Staging CTs 01/14/2018 revealed mild wall thickening at the GE junction.  No evidence of metastatic disease.  5 mm sub-solid right upper lobe pulmonary nodule.  CT findings of emphysema and coronary atherosclerosis. He has no dysphasia.  No recurrent bleeding, but his stools are dark.  He is taking iron.  He has chronic exertional dyspnea.  He has difficulty climbing stairs.  Objective:  Vital signs in last 24 hours:  Blood pressure (!) 127/96, pulse 73, temperature 97.6 F (36.4 C), temperature source Oral, resp. rate 18, height 5' 8"  (1.727 m), weight 193 lb 1.6 oz (87.6 kg), SpO2 99 %.     Resp: Lungs clear bilaterally Cardio: Irregular Vascular: Trace pitting edema at the lower leg bilaterally  Lab Results:  Lab Results  Component Value Date   WBC 7.3 01/19/2018   HGB 10.0 (L) 01/19/2018   HCT 30.7 (L) 01/19/2018   MCV 98.9 (H) 01/19/2018   PLT 346 01/19/2018   NEUTROABS 5.0 01/19/2018    CMP     Component Value Date/Time   NA 142 01/19/2018 0947   NA 142 12/11/2017 1128   K 4.3 01/19/2018 0947   CL 110 (H) 01/19/2018 0947   CO2 27 01/19/2018 0947   GLUCOSE 85 01/19/2018 0947   BUN 11 01/19/2018 0947   BUN 18 12/11/2017 1128   CREATININE 0.82 01/19/2018 0947   CALCIUM 8.8 01/19/2018 0947   PROT 5.7 (L) 01/19/2018 0947   PROT 7.1 12/11/2017 1128   ALBUMIN 3.2 (L) 01/19/2018 0947   ALBUMIN 4.6 12/11/2017 1128   AST 20 01/19/2018 0947   ALT 20 01/19/2018 0947   ALKPHOS 49 01/19/2018 0947   BILITOT 0.4 01/19/2018 0947   GFRNONAA >60 01/19/2018 0947   GFRAA >60 01/19/2018 0947    Lab Results  Component Value Date   CEA1 <1.00 01/19/2018    Lab Results  Component Value Date   INR 1.1 01/12/2018    Imaging: CT images 01/14/2018-reviewed  Medications: I have reviewed the patient's current  medications.   Assessment/Plan: 1. Adenocarcinoma of the GE junction ? Partially obstructing mass noted at the GE junction on endoscopy 01/07/2018, nonobstructing ? CTs 01/14/2018, sub-solid 5 mm right upper lobe nodule, mild eccentric wall thickening at the GE junction 2. GI bleeding secondary to #1 3. Atrial fibrillation-maintained on Coumadin prior to hospital admission 01/07/2018 4. Coronary artery disease 5. Chronic exertional dyspnea-likely COPD 6. History of tobacco use 7. History of kidney stones  Disposition: Mr. Joynt appears unchanged.  The hemoglobin is improved.  He remains off of anticoagulation therapy.  The staging CTs revealed no evidence of metastatic disease.  He appears to be a candidate for chemotherapy and radiation.  He has a history of coronary artery disease, atrial fibrillation, and appears to have COPD.  He may not be a surgical candidate.  He is scheduled to see Dr. Servando Snare later this week.  I do not recommend an endoscopic ultrasound unless he is a surgical candidate.  He is scheduled to see Dr. Lisbeth Renshaw later today.  I discussed the case with Dr. Lisbeth Renshaw.  The plan is to initiate concurrent weekly Taxol/carboplatin and chemotherapy during the week of 02/01/2018.  We discussed potential toxicities associated with the Taxol/carboplatin regimen including the chance for nausea/vomiting, alopecia, and hematologic toxicity.  We discussed the allergic reaction, neuropathy, and bone pain associated with Taxol.  We discussed the potential for an allergic reaction with carboplatin.  He agrees to proceed.  He will attended chemotherapy teaching class today.  Mr. Spagnolo will be referred for PET scan for radiation planning.  He will return for an office visit and the first cycle of chemotherapy on 02/03/2018.  He met with the research nurse today and agrees to participate in a blood draw study for circulating tumor DNA.  30 minutes were spent with the patient today.  The  majority of the time was used for counseling and coordination of care.  Betsy Coder, MD  01/19/2018  2:17 PM

## 2018-01-19 NOTE — Progress Notes (Signed)
START ON PATHWAY REGIMEN - Gastroesophageal     Administer weekly during RT:     Paclitaxel      Carboplatin   **Always confirm dose/schedule in your pharmacy ordering system**    Patient Characteristics: Esophageal & GE Junction, Adenocarcinoma, Preoperative or Nonsurgical Candidate (Clinical Staging), cT3 or Higher or cN+, Surgical Candidate (Up to cT4a) - Preoperative Therapy, GE Junction Histology: Adenocarcinoma Disease Classification: GE Junction Therapeutic Status: Preoperative or Nonsurgical Candidate (Clinical Staging) AJCC Grade: Staged < 8th Ed. AJCC 8 Stage Grouping: Staged < 8th Ed. AJCC T Category: Staged < 8th Ed. AJCC N Category: Staged < 8th Ed. AJCC M Category: Staged < 8th Ed. Intent of Therapy: Curative Intent, Discussed with Patient

## 2018-01-21 ENCOUNTER — Institutional Professional Consult (permissible substitution) (INDEPENDENT_AMBULATORY_CARE_PROVIDER_SITE_OTHER): Payer: Medicare Other | Admitting: Cardiothoracic Surgery

## 2018-01-21 ENCOUNTER — Telehealth: Payer: Self-pay | Admitting: Oncology

## 2018-01-21 VITALS — BP 124/75 | HR 64 | Resp 20 | Ht 68.0 in | Wt 190.0 lb

## 2018-01-21 DIAGNOSIS — C155 Malignant neoplasm of lower third of esophagus: Secondary | ICD-10-CM

## 2018-01-21 DIAGNOSIS — I2510 Atherosclerotic heart disease of native coronary artery without angina pectoris: Secondary | ICD-10-CM

## 2018-01-21 NOTE — Telephone Encounter (Signed)
Scheduled appt per 5/14 los - left message with appt date and time for 5/29 treatment .,

## 2018-01-21 NOTE — Progress Notes (Signed)
[address]       [place] [address]             [phone]                    Larry Bautista [place] Medical Record #[id number] Date of Birth: 1940-11-25  Referring: Ladell Pier, MD Primary Care: Larry Sons, MD Primary Cardiologist: No primary care provider on file.  Chief Complaint:    Chief Complaint  Patient presents with  . Esophageal Cancer    Surgical eval, Pet Scan 01/27/18, C/A/P CT 01/14/18, UGI Endo with path 01/07/18, last PFT's 11/06/16     History of Present Illness:    Larry Bautista 77 y.o. male is seen in the office  today for a new diagnosis of distal esophageal adenocarcinoma.  The patient has had no previous history of esophageal problems or GI bleed.  He noted several months of increasing weakness and fatigue.  He denied any weight loss.  He noted increasing episodes of wheezing along with the fatigue and thought his underlying COPD was worse.  3 days prior to admission he developed black stools.  He then on the day of admission became very weak nauseated and vomited vomited bright red blood.   The patient had been on Coumadin for 4 to 5 years for chronic atrial fibrillation after admission his Coumadin was reversed endoscopy showed a distal esophageal mass pathology showed adenocarcinoma CT scan of the chest and abdomen was performed.  The patient was given 2 units of packed red blood cells.  He was then discharged from the hospital on aspirin and Coumadin and told to go and make an appointment at the cancer center and in the thoracic surgery office.   When he was seen in the oncology office by Dr. Benay Spice his Coumadin and aspirin was stopped.  Formal staging is not complete, PET scan is pending next week, EUS has not been done    Current Activity/ Functional Status:  Patient is independent with mobility/ambulation, transfers, ADL's, IADL's.   Zubrod Score: At the time of surgery this patient's most appropriate activity  status/level should be described as: []     0    Normal activity, no symptoms [x]     1    Restricted in physical strenuous activity but ambulatory, able to do out light work []     2    Ambulatory and capable of self care, unable to do work activities, up and about               >50 % of waking hours                              []     3    Only limited self care, in bed greater than 50% of waking hours []     4    Completely disabled, no self care, confined to bed or chair []     5    Moribund   Past Medical History:  Diagnosis Date  . Allergy   . CHF (congestive heart failure) ([place])   . Chronic knee pain   . Coronary artery disease    with LAD DE stent 2004  . Heel spur, left   . Hyperlipidemia   . Hypertension   . Kidney stones   . Lower back pain    status post surgery for spondylolisthesis  .  Migraine   . Persistent atrial fibrillation ([place])    longstanding persistent (since 05/2008)  . Plantar fasciitis     Past Surgical History:  Procedure Laterality Date  . BACK SURGERY     spondylolisthesis  . CARDIAC CATHETERIZATION  03/06/2003  . CORONARY ANGIOPLASTY WITH STENT PLACEMENT  04/2003   CYPHER stent implantation in the LAD  . ESOPHAGOGASTRODUODENOSCOPY (EGD) WITH PROPOFOL N/A 01/07/2018   Procedure: ESOPHAGOGASTRODUODENOSCOPY (EGD) WITH PROPOFOL;  Surgeon: Wonda Horner, MD;  Location: [facility] ENDOSCOPY;  Service: Endoscopy;  Laterality: N/A;  . HAND SURGERY Right 2014   carpal tunnel  . SKIN CANCER EXCISION  1980's   Face  . VASCULAR SURGERY     Vascular Stent    Family History  Problem Relation Age of Onset  . Heart attack Father   . Colon cancer Father   . Congestive Heart Failure Father   . Atrial fibrillation Brother   . Stroke Brother   . Bladder Cancer Brother   . Lung cancer Mother   . Atrial fibrillation Brother   . COPD Brother   . Congestive Heart Failure Brother   . Lung cancer Brother   . Migraines Daughter    Family history significant for both his  brother and mother who died of lung cancer, father died of colon cancer one sister had a neurological degenerative disease he has one daughter and one stepdaughter  Social History   Tobacco Use  Smoking Status Former Research scientist (life sciences)  . Packs/day: 2.00  . Years: 35.00  . Pack years: 70.00  . Types: Cigarettes  . Start date: 09/08/1954  . Last attempt to quit: 11/06/1989  . Years since quitting: 28.2  Smokeless Tobacco Never Used  Tobacco Comment   Smoked from [phone] (35 years)    Social History   Substance and Sexual Activity  Alcohol Use Not Currently  . Alcohol/week: 1.2 oz  . Types: 2 Standard drinks or equivalent per week   Comment: 1961-2009 (Last drink 2009)      Allergies  Allergen Reactions  . Simvastatin Other (See Comments)    Leg pain  . Lisinopril Cough  . Celecoxib Rash    Current Outpatient Medications  Medication Sig Dispense Refill  . albuterol (PROAIR HFA) 108 (90 Base) MCG/ACT inhaler Inhale 1-2 puffs into the lungs every 6 (six) hours as needed for wheezing or shortness of breath. 1 Inhaler prn  . cetirizine (ZYRTEC) 10 MG tablet Take 10 mg by mouth daily.    [name] dexamethasone (DECADRON) 2 MG tablet Take 5 tablets (10mg ) PO at 10 PM the night before first chemo. Take 5 tablets (10mg ) 6 AM the morning of first chemo. (Patient not taking: Reported on 01/19/2018) 10 tablet 0  . diphenhydramine-acetaminophen (TYLENOL PM) 25-500 MG TABS Take 1 tablet by mouth at bedtime as needed (for sleep).     [name] eplerenone (INSPRA) 25 MG tablet Take 1 tablet (25 mg total) by mouth daily. Please keep 5/31 appointment for additional refills thanks. 30 tablet 1  . ferrous sulfate 325 (65 FE) MG tablet Take 1 tablet (325 mg total) by mouth 2 (two) times daily with a meal.    . fish oil-omega-3 fatty acids 1000 MG capsule Take 2 capsules by mouth daily.     . fluticasone (FLONASE) 50 MCG/ACT nasal spray USE 2 SPRAYS IN EACH NOSTRIL DAILY 48 g 3  . gabapentin (NEURONTIN) 300 MG capsule Take  300 mg by mouth every other day.     . metoprolol succinate (  TOPROL-XL) 25 MG 24 hr tablet Take 1 tablet (25 mg total) by mouth daily. Please keep 5/31 appointment for additional refills thanks. 30 tablet 1  . multivitamin (THERAGRAN) per tablet Take 1 tablet by mouth daily.    . nitroGLYCERIN (NITROSTAT) 0.4 MG SL tablet Place 0.4 mg under the tongue every 5 (five) minutes as needed.    [name] omeprazole (PRILOSEC) 20 MG capsule Take 1 capsule (20 mg total) by mouth daily. 30 capsule 0  . pravastatin (PRAVACHOL) 80 MG tablet Take 1 tablet (80 mg total) by mouth daily. Please keep 5/31 appointment for additional refills thanks. (Patient not taking: Reported on 01/14/2018) 30 tablet 1  . prochlorperazine (COMPAZINE) 5 MG tablet Take 1 tablet (5 mg total) by mouth every 6 (six) hours as needed for nausea or vomiting. 30 tablet 0  . psyllium (METAMUCIL) 58.6 % powder Take 1 packet by mouth daily.     . tamsulosin (FLOMAX) 0.4 MG CAPS capsule Take 0.4 mg by mouth daily after supper.    . vitamin C (ASCORBIC ACID) 500 MG tablet Take 500 mg by mouth daily.    [name] warfarin (COUMADIN) 2.5 MG tablet Take 2.5 mg by mouth daily.    [name] warfarin (COUMADIN) 5 MG tablet TAKE ONE TABLET DAILY AS DIRECTED (Patient not taking: Reported on 01/19/2018) 90 tablet 0   No current facility-administered medications for this visit.     Pertinent items are noted in HPI.   Review of Systems:     Cardiac Review of Systems: [Y] = yes  or   [ N ] = no   Chest Pain [  n  ]  Resting SOB [ y  ] Exertional SOB  Blue.Reese  ]  Orthopnea Florencio.Farrier  ]   Pedal Edema [ y  ]    Palpitations [n] Syncope  [n  ]   Presyncope [ y  ]   General Review of Systems: [Y] = yes [  ]=no Constitional: recent weight change [  ];  Wt loss over the last 3 months [   ] anorexia [  ]; fatigue Blue.Reese  ]; nausea [  ]; night sweats [  ]; fever [  ]; or chills [  ];           Eye : blurred vision [  ]; diplopia [   ]; vision changes [  ];  Amaurosis fugax[  ]; Resp: cough [  ];   wheezing[  ];  hemoptysis[  ]; shortness of breath[  ]; paroxysmal nocturnal dyspnea[  ]; dyspnea on exertion[  ]; or orthopnea[  ];  GI:  gallstones[  ], vomiting[y  ];  dysphagia[  ]; melena[ y ];  hematochezia Blue.Reese  ]; heartburn[  ];   Hx of  Colonoscopy[  ]; GU: kidney stones [  ]; hematuria[  ];   dysuria [  ];  nocturia[  ];  history of     obstruction [  ]; urinary frequency [ y ]             Skin: rash, swelling[  ];, hair loss[  ];  peripheral edema[  ];  or itching[  ]; Musculosketetal: myalgias[  ];  joint swelling[  ];  joint erythema[  ];  joint pain[ y ];  back pain[ y ];  Heme/Lymph: bruising[ y ];  bleeding[  ];  anemia[  ];  Neuro: TIA[  ];  headaches[  ];  stroke[  ];  vertigo[  ];  seizures[  ];   paresthesias[  ];  difficulty walking[  ];  Psych:depression[  ]; anxiety[  ];  Endocrine: diabetes[  ];  thyroid dysfunction[  ];  Immunizations: Flu up to date Blue.Reese  ]; Pneumococcal up to date [ y ];  Other:    PHYSICAL EXAMINATION: Ht 5\' 8"  (1.727 m)   BMI 29.38 kg/m  General appearance: alert, cooperative and no distress Head: Normocephalic, without obvious abnormality, atraumatic Neck: no adenopathy, no carotid bruit, no JVD, supple, symmetrical, trachea midline and thyroid not enlarged, symmetric, no tenderness/mass/nodules Lymph nodes: Cervical, supraclavicular, and axillary nodes normal. Resp: clear to auscultation bilaterally Back: symmetric, no curvature. ROM normal. No CVA tenderness. Cardio: irregularly irregular rhythm GI: soft, non-tender; bowel sounds normal; no masses,  no organomegaly Extremities: extremities normal, atraumatic, no cyanosis or edema and Homans sign is negative, no sign of DVT Neurologic: Grossly normal   Wt Readings from Last 3 Encounters:  01/19/18 193 lb 3.2 oz (87.6 kg)  01/19/18 193 lb 1.6 oz (87.6 kg)  01/14/18 189 lb 14.4 oz (86.1 kg)   Diagnostic Studies & Laboratory data:     Recent Radiology Findings:   Ct Chest W  Contrast  Result Date: 01/15/2018 CLINICAL DATA:  New diagnosis of nonobstructing gastroesophageal junction adenocarcinoma on 01/07/2018 upper endoscopy. EXAM: CT CHEST, ABDOMEN, AND PELVIS WITH CONTRAST TECHNIQUE: Multidetector CT imaging of the chest, abdomen and pelvis was performed following the standard protocol during bolus administration of intravenous contrast. CONTRAST:  148mL OMNIPAQUE IOHEXOL 300 MG/ML  SOLN COMPARISON:  12/08/2017 chest radiograph. 07/11/2015 CT abdomen/pelvis. FINDINGS: CT CHEST FINDINGS Cardiovascular: Normal heart size. No significant pericardial effusion/thickening. Left main and 3 vessel coronary atherosclerosis. Atherosclerotic nonaneurysmal thoracic aorta. Normal caliber pulmonary arteries. No central pulmonary emboli. Mediastinum/Nodes: No discrete thyroid nodules. There is mild eccentric wall thickening posteriorly at the esophagogastric junction (series 2/image 52), without a discrete measurable mass by CT. No pathologically enlarged axillary, mediastinal or hilar lymph nodes. Lungs/Pleura: No pneumothorax. No pleural effusion. Moderate centrilobular emphysema with diffuse bronchial wall thickening. Sub solid right upper lobe 5 mm pulmonary nodule (series 4/image 36). No acute consolidative airspace disease, lung masses or additional significant pulmonary nodules. A few scattered small parenchymal bands at the lung bases, compatible with mild postinfectious/postinflammatory scarring. Musculoskeletal: No aggressive appearing focal osseous lesions. Right shoulder joint effusion/bursal collection anterior to the right scapula (series 2/image 6). Asymmetric advanced right shoulder osteoarthritis. Marked thoracic spondylosis. CT ABDOMEN PELVIS FINDINGS Hepatobiliary: Normal liver size. Subcentimeter hypodense left liver lobe lesions are too small to characterize and are stable since 07/11/2015 CT, considered benign. No new liver lesions. Normal gallbladder with no radiopaque  cholelithiasis. No biliary ductal dilatation. Pancreas: Normal, with no mass or duct dilation. Spleen: Normal size. No mass. Adrenals/Urinary Tract: Normal adrenals. No hydronephrosis. Nonobstructing 5 mm lower right renal stone. Small simple renal cysts in both kidneys, largest 2.9 cm in the lateral upper left kidney. Additional subcentimeter hypodense renal cortical lesions in both kidneys are too small to characterize and require no follow-up. Normal bladder. Stomach/Bowel: Mild eccentric wall thickening posteriorly at the esophagogastric junction (series 2/image 52), without a discrete measurable mass by CT. Otherwise normal stomach. Normal caliber small bowel with no small bowel wall thickening. Normal appendix. Normal large bowel with no diverticulosis, large bowel wall thickening or pericolonic fat stranding. Vascular/Lymphatic: Atherosclerotic nonaneurysmal abdominal aorta. Patent portal, splenic, hepatic and renal veins. No pathologically enlarged lymph nodes in the abdomen or pelvis. Reproductive: Mildly enlarged prostate with nonspecific internal  prostatic calcifications. Other: No pneumoperitoneum, ascites or focal fluid collection. Small fat containing left inguinal hernia, stable. Musculoskeletal: No aggressive appearing focal osseous lesions. Marked lumbar spondylosis. Postsurgical changes from bilateral posterior spinal fusion at L5-S1. IMPRESSION: 1. Mild eccentric wall thickening posteriorly at the esophagogastric junction, without a discrete measurable mass by CT, compatible with the provided history of esophagogastric junction malignancy. 2. No locoregional adenopathy. No definite distant metastatic disease. 3. Solitary subsolid 5 mm right upper lobe pulmonary nodule. Recommend attention on follow-up chest CT in 3-6 months. 4. Chronic findings include: Aortic Atherosclerosis (ICD10-I70.0) and Emphysema (ICD10-J43.9). Left main and 3 vessel coronary atherosclerosis. Nonobstructing right  nephrolithiasis. Mildly enlarged prostate. Stable small fat containing left inguinal hernia. Electronically Signed   By: Ilona Sorrel M.D.   On: 01/15/2018 16:34     I have independently reviewed the above radiology studies  and reviewed the findings with the patient.   Recent Lab Findings: Lab Results  Component Value Date   WBC 7.3 01/19/2018   HGB 10.0 (L) 01/19/2018   HCT 30.7 (L) 01/19/2018   PLT 346 01/19/2018   GLUCOSE 85 01/19/2018   CHOL 177 12/11/2017   TRIG 112 12/11/2017   HDL 57 12/11/2017   LDLDIRECT 136.6 08/24/2013   LDLCALC 98 12/11/2017   ALT 20 01/19/2018   AST 20 01/19/2018   NA 142 01/19/2018   K 4.3 01/19/2018   CL 110 (H) 01/19/2018   CREATININE 0.82 01/19/2018   BUN 11 01/19/2018   CO2 27 01/19/2018   TSH 0.47 11/06/2016   INR 1.1 01/12/2018   HGBA1C 6.0 (H) 12/11/2017   ENDO:/ no EUS done A medium-sized, ulcerating mass was found at the gastroesophageal junction. The mass was nonobstructing and not circumferential. Biopsies were taken with a cold forceps for histology. Findings: Erythematous mucosa was found in the prepyloric region of the stomach. The in the duodenum was normal.   PATH: Diagnosis Esophagogastric junction, biopsy - ADENOCARCINOMA. - SEE COMMENT. Microscopic Comment Dr. Vicente Males has reviewed the case and concurs with this interpretation. Dr. Penelope Coop was paged on 01/08/2018. Additional studies can be performed upon clinician request. (JBK:ah 01/08/18) Enid Cutter MD Pathologist, Electronic Signature (Case signed 01/08/2018)   Pulmonary function studies done in the pulmonary office 2018 FEV1 1.9 to 70% predicted DLCO 22.4 475% predicted No formal reading with the study   Assessment / Plan:   #1 adenocarcinoma of the GE junction, partially obstructing mass noted on endoscopy 01/07/2018, CT scan of the chest and abdomen shows small 5 mm right upper lobe lung nodule and eccentric wall thickening of the GE junction #2 patient  presented with GI bleed secondary to #1 with blood loss anemia #3 history of atrial fibrillation had been on Coumadin prior to admission for GI bleed #4 known coronary artery disease #5 probable COPD-moderate    I discussed with the patient the diagnosis, he does have some significant risk factors that would make esophageal resection carry a higher risk than normal.  At this point he has no absolute contraindication for surgery, but does need to complete full staging work-up with PET scan and then depending on the results possible EUS.  I agree with Dr. Benay Spice, at this point it is reasonable to proceed with radiation and chemotherapy, and reevaluate possible surgical resection at the completion of chemotherapy and radiation.  If we decide not to proceed with surgical resection additional radiation therapy could be completed  I will plan to see the patient back in approximately 5  weeks, after the results of the PET scan are available we will discuss with Dr. Benay Spice current stage.  Patient was given printed material concerning surgical resection.   I  spent 60 minutes with  the patient face to face and greater then 50% of the time was spent in counseling and coordination of care.    Grace Isaac MD      [address] [place], [address] Office [phone]   Beeper [phone]  01/21/2018 10:15 AM

## 2018-01-22 NOTE — Progress Notes (Signed)
  Oncology Nurse Navigator Documentation  Navigator Location: [facility] (01/22/18 1121)   )Navigator Encounter Type: Telephone (01/22/18 1121) Telephone: Lahoma Crocker Call;Appt Confirmation/Clarification (01/22/18 1121)  Called and spoke with pts wife to review upcoming appointments and to assess for navigation needs. Mrs. Granja Patent attorney. No needs voiced.                                        Acuity: Level 2 (01/22/18 1121)         Time Spent with Patient: 15 (01/22/18 1121)

## 2018-01-26 ENCOUNTER — Ambulatory Visit
Admission: RE | Admit: 2018-01-26 | Discharge: 2018-01-26 | Disposition: A | Discharge status: Home / Self Care | Payer: Medicare Other | Source: Ambulatory Visit | Attending: Radiation Oncology | Admitting: Radiation Oncology

## 2018-01-26 DIAGNOSIS — C160 Malignant neoplasm of cardia: Secondary | ICD-10-CM | POA: Diagnosis not present

## 2018-01-26 DIAGNOSIS — Z510 Encounter for antineoplastic radiation therapy: Secondary | ICD-10-CM | POA: Insufficient documentation

## 2018-01-27 ENCOUNTER — Encounter (HOSPITAL_COMMUNITY)
Admission: RE | Admit: 2018-01-27 | Discharge: 2018-01-27 | Disposition: A | Discharge status: Home / Self Care | Payer: Medicare Other | Source: Ambulatory Visit | Attending: Oncology | Admitting: Oncology

## 2018-01-27 DIAGNOSIS — C160 Malignant neoplasm of cardia: Secondary | ICD-10-CM | POA: Insufficient documentation

## 2018-01-27 DIAGNOSIS — C159 Malignant neoplasm of esophagus, unspecified: Secondary | ICD-10-CM | POA: Diagnosis not present

## 2018-01-27 LAB — GLUCOSE, CAPILLARY: Glucose-Capillary: 96 mg/dL (ref 65–99)

## 2018-01-27 MED ORDER — FLUDEOXYGLUCOSE F - 18 (FDG) INJECTION
9.5400 | Freq: Once | INTRAVENOUS | Status: AC | PRN
Start: 2018-01-27 — End: 2018-01-27
  Administered 2018-01-27: 9.54 via INTRAVENOUS

## 2018-01-28 ENCOUNTER — Telehealth: Payer: Self-pay | Admitting: Radiation Oncology

## 2018-01-28 NOTE — Telephone Encounter (Signed)
I called the patient to review PET scan results and that we would move forward with treatment. I will also send this note to his urologist given the findings in the prostate. He needs to be seen for PSA and evaluation in their office.

## 2018-01-29 ENCOUNTER — Telehealth: Payer: Self-pay

## 2018-01-29 DIAGNOSIS — C160 Malignant neoplasm of cardia: Secondary | ICD-10-CM | POA: Diagnosis not present

## 2018-01-29 DIAGNOSIS — Z510 Encounter for antineoplastic radiation therapy: Secondary | ICD-10-CM | POA: Diagnosis not present

## 2018-01-29 NOTE — Telephone Encounter (Addendum)
Pt voiced understanding of message below   ----- Message from Ladell Pier, MD sent at 01/28/2018  5:36 PM EDT ----- Please call patient the PET scan shows no evidence of metastatic disease, there is a possible nodule in the prostate-could represent prostate cancer, should not affect the current treatment plan

## 2018-02-01 ENCOUNTER — Other Ambulatory Visit: Payer: Self-pay | Admitting: Oncology

## 2018-02-02 ENCOUNTER — Other Ambulatory Visit: Payer: Self-pay | Admitting: *Deleted

## 2018-02-02 ENCOUNTER — Ambulatory Visit: Payer: Medicare Other

## 2018-02-02 DIAGNOSIS — C160 Malignant neoplasm of cardia: Secondary | ICD-10-CM

## 2018-02-03 ENCOUNTER — Telehealth: Payer: Self-pay | Admitting: Oncology

## 2018-02-03 ENCOUNTER — Inpatient Hospital Stay (HOSPITAL_BASED_OUTPATIENT_CLINIC_OR_DEPARTMENT_OTHER): Payer: Medicare Other | Admitting: Oncology

## 2018-02-03 ENCOUNTER — Inpatient Hospital Stay: Payer: Medicare Other

## 2018-02-03 ENCOUNTER — Ambulatory Visit
Admission: RE | Admit: 2018-02-03 | Discharge: 2018-02-03 | Disposition: A | Discharge status: Home / Self Care | Payer: Medicare Other | Source: Ambulatory Visit | Attending: Radiation Oncology | Admitting: Radiation Oncology

## 2018-02-03 VITALS — BP 150/88 | HR 82 | Temp 97.6°F | Resp 18 | Ht 68.0 in | Wt 189.2 lb

## 2018-02-03 VITALS — BP 150/99 | HR 77 | Temp 97.7°F | Resp 18

## 2018-02-03 DIAGNOSIS — Z87891 Personal history of nicotine dependence: Secondary | ICD-10-CM | POA: Diagnosis not present

## 2018-02-03 DIAGNOSIS — N401 Enlarged prostate with lower urinary tract symptoms: Secondary | ICD-10-CM

## 2018-02-03 DIAGNOSIS — Z510 Encounter for antineoplastic radiation therapy: Secondary | ICD-10-CM | POA: Diagnosis not present

## 2018-02-03 DIAGNOSIS — I480 Paroxysmal atrial fibrillation: Secondary | ICD-10-CM | POA: Diagnosis not present

## 2018-02-03 DIAGNOSIS — C160 Malignant neoplasm of cardia: Secondary | ICD-10-CM

## 2018-02-03 DIAGNOSIS — J449 Chronic obstructive pulmonary disease, unspecified: Secondary | ICD-10-CM | POA: Diagnosis not present

## 2018-02-03 DIAGNOSIS — R351 Nocturia: Principal | ICD-10-CM

## 2018-02-03 DIAGNOSIS — I2510 Atherosclerotic heart disease of native coronary artery without angina pectoris: Secondary | ICD-10-CM

## 2018-02-03 DIAGNOSIS — Z7901 Long term (current) use of anticoagulants: Secondary | ICD-10-CM | POA: Diagnosis not present

## 2018-02-03 DIAGNOSIS — K922 Gastrointestinal hemorrhage, unspecified: Secondary | ICD-10-CM

## 2018-02-03 DIAGNOSIS — Z87442 Personal history of urinary calculi: Secondary | ICD-10-CM | POA: Diagnosis not present

## 2018-02-03 DIAGNOSIS — Z5111 Encounter for antineoplastic chemotherapy: Secondary | ICD-10-CM | POA: Diagnosis not present

## 2018-02-03 LAB — CMP (CANCER CENTER ONLY)
ALBUMIN: 3.9 g/dL (ref 3.5–5.0)
ALT: 16 U/L (ref 0–55)
ANION GAP: 10 (ref 3–11)
AST: 18 U/L (ref 5–34)
Alkaline Phosphatase: 55 U/L (ref 40–150)
BUN: 12 mg/dL (ref 7–26)
CO2: 25 mmol/L (ref 22–29)
Calcium: 9.5 mg/dL (ref 8.4–10.4)
Chloride: 108 mmol/L (ref 98–109)
Creatinine: 0.78 mg/dL (ref 0.70–1.30)
GFR, Est AFR Am: >60 mL/min (ref >60)
GFR, Estimated: >60 mL/min (ref >60)
GLUCOSE: 152 mg/dL — AB (ref 70–140)
POTASSIUM: 4.9 mmol/L (ref 3.5–5.1)
Sodium: 143 mmol/L (ref 136–145)
TOTAL PROTEIN: 6.7 g/dL (ref 6.4–8.3)
Total Bilirubin: 0.4 mg/dL (ref 0.2–1.2)

## 2018-02-03 LAB — CBC WITH DIFFERENTIAL (CANCER CENTER ONLY)
BASOS ABS: 0 10*3/uL (ref 0.0–0.1)
Basophils Relative: 0 %
Eosinophils Absolute: 0 10*3/uL (ref 0.0–0.5)
Eosinophils Relative: 0 %
HEMATOCRIT: 40.4 % (ref 38.4–49.9)
HEMOGLOBIN: 12.5 g/dL — AB (ref 13.0–17.1)
LYMPHS PCT: 6 %
Lymphs Abs: 0.5 10*3/uL — ABNORMAL LOW (ref 0.9–3.3)
MCH: 31.5 pg (ref 27.2–33.4)
MCHC: 30.9 g/dL — ABNORMAL LOW (ref 32.0–36.0)
MCV: 101.8 fL — AB (ref 79.3–98.0)
MONO ABS: 0.1 10*3/uL (ref 0.1–0.9)
MONOS PCT: 1 %
NEUTROS ABS: 7.8 10*3/uL — AB (ref 1.5–6.5)
Neutrophils Relative %: 93 %
Platelet Count: 316 10*3/uL (ref 140–400)
RBC: 3.97 MIL/uL — ABNORMAL LOW (ref 4.20–5.82)
RDW: 15.2 % — AB (ref 11.0–14.6)
WBC Count: 8.4 10*3/uL (ref 4.0–10.3)

## 2018-02-03 MED ORDER — PALONOSETRON HCL INJECTION 0.25 MG/5ML
INTRAVENOUS | Status: AC
  Filled 2018-02-03: qty 5

## 2018-02-03 MED ORDER — DIPHENHYDRAMINE HCL 50 MG/ML IJ SOLN
INTRAMUSCULAR | Status: AC
Start: 2018-02-03 — End: ?
  Filled 2018-02-03: qty 1

## 2018-02-03 MED ORDER — FAMOTIDINE IN NACL 20-0.9 MG/50ML-% IV SOLN
INTRAVENOUS | Status: AC
  Filled 2018-02-03: qty 50

## 2018-02-03 MED ORDER — SODIUM CHLORIDE 0.9 % IV SOLN
20.0000 mg | Freq: Once | INTRAVENOUS | Status: AC
  Administered 2018-02-03: 20 mg via INTRAVENOUS
  Filled 2018-02-03: qty 2

## 2018-02-03 MED ORDER — PALONOSETRON HCL INJECTION 0.25 MG/5ML
0.2500 mg | Freq: Once | INTRAVENOUS | Status: AC
  Administered 2018-02-03: 0.25 mg via INTRAVENOUS

## 2018-02-03 MED ORDER — SODIUM CHLORIDE 0.9 % IV SOLN
50.0000 mg/m2 | Freq: Once | INTRAVENOUS | Status: AC
  Administered 2018-02-03: 102 mg via INTRAVENOUS
  Filled 2018-02-03: qty 17

## 2018-02-03 MED ORDER — FAMOTIDINE IN NACL 20-0.9 MG/50ML-% IV SOLN
20.0000 mg | Freq: Once | INTRAVENOUS | Status: AC
  Administered 2018-02-03: 20 mg via INTRAVENOUS

## 2018-02-03 MED ORDER — SODIUM CHLORIDE 0.9% FLUSH
10.0000 mL | INTRAVENOUS | Status: DC | PRN
  Filled 2018-02-03: qty 10

## 2018-02-03 MED ORDER — SODIUM CHLORIDE 0.9 % IV SOLN
200.8000 mg | Freq: Once | INTRAVENOUS | Status: AC
  Administered 2018-02-03: 200 mg via INTRAVENOUS
  Filled 2018-02-03: qty 20

## 2018-02-03 MED ORDER — DIPHENHYDRAMINE HCL 50 MG/ML IJ SOLN
25.0000 mg | Freq: Once | INTRAMUSCULAR | Status: AC
  Administered 2018-02-03: 25 mg via INTRAVENOUS

## 2018-02-03 MED ORDER — SODIUM CHLORIDE 0.9 % IV SOLN
Freq: Once | INTRAVENOUS | Status: AC
  Administered 2018-02-03: 10:00:00 via INTRAVENOUS

## 2018-02-03 NOTE — Progress Notes (Signed)
  [facility] OFFICE PROGRESS NOTE   Diagnosis: Esophagus cancer  INTERVAL HISTORY:   Larry Bautista returns as scheduled.  He is scheduled to begin radiation and chemotherapy today.  He saw Dr. Servando Snare. Dr. Servando Snare agrees with the plan for chemotherapy/radiation. A staging PET scan on 01/27/2018 revealed changes of emphysema.  Hypermetabolic activity at the GE junction/proximal stomach.  No hypermetabolic adenopathy in the abdomen/pelvis.  There is a focus of hypermetabolism at the right posterior prostate with an SUV of 6.8.  Objective:  Vital signs in last 24 hours:  Blood pressure (!) 150/88, pulse 82, temperature 97.6 F (36.4 C), temperature source Oral, resp. rate 18, height 5\' 8"  (1.727 m), weight 189 lb 3.2 oz (85.8 kg), SpO2 97 %.    Resp: Lungs clear bilaterally Cardio: Irregular GI: No hepatomegaly, nontender Vascular: 1+ pitting edema at the lower leg bilaterally     Lab Results:  Lab Results  Component Value Date   WBC 8.4 02/03/2018   HGB 12.5 (L) 02/03/2018   HCT 40.4 02/03/2018   MCV 101.8 (H) 02/03/2018   PLT 316 02/03/2018   NEUTROABS 7.8 (H) 02/03/2018    CMP  Lab Results  Component Value Date   NA 142 01/19/2018   K 4.3 01/19/2018   CL 110 (H) 01/19/2018   CO2 27 01/19/2018   GLUCOSE 85 01/19/2018   BUN 11 01/19/2018   CREATININE 0.82 01/19/2018   CALCIUM 8.8 01/19/2018   PROT 5.7 (L) 01/19/2018   ALBUMIN 3.2 (L) 01/19/2018   AST 20 01/19/2018   ALT 20 01/19/2018   ALKPHOS 49 01/19/2018   BILITOT 0.4 01/19/2018   GFRNONAA >60 01/19/2018   GFRAA >60 01/19/2018    Lab Results  Component Value Date   CEA1 <1.00 01/19/2018     Medications: I have reviewed the patient's current medications.   Assessment/Plan: 1. Adenocarcinoma of the GE junction ? Partially obstructing mass noted at the GE junction on endoscopy 01/07/2018, nonobstructing ? CTs 01/14/2018, sub-solid 5 mm right upper lobe nodule, mild eccentric wall  thickening at the GE junction ? PET scan 01/27/2018-hypermetabolism at the GE junction, no evidence of metastatic disease 2. GI bleeding secondary to #1 3. Atrial fibrillation-maintained on Coumadin prior to hospital admission 01/07/2018 4. Coronary artery disease 5. Chronic exertional dyspnea-likely COPD 6. History of tobacco use 7. History of kidney stones 8. Focus of hypermetabolism at the right prostate on the PET scan 01/27/2018   Disposition: Larry Bautista appears stable.  The anemia has corrected.  He will begin concurrent Taxol/carboplatin chemotherapy and radiation today.  The staging PET scan reveals no evidence of distant metastatic disease. The focus of hypermetabolism at the prostate may represent a prostate cancer.  He is scheduled to see Dr. Junious Silk in July.  We will check a PSA when he returns for a lab visit next week.  25 minutes were spent with the patient today.  The majority of the time was used for counseling and coordination of care.  Betsy Coder, MD  02/03/2018  8:45 AM

## 2018-02-03 NOTE — Patient Instructions (Signed)
[facility] Discharge Instructions for Patients Receiving Chemotherapy  Today you received the following chemotherapy agents taxol and carboplatin.  To help prevent nausea and vomiting after your treatment, we encourage you to take your nausea medication compazine.   If you develop nausea and vomiting that is not controlled by your nausea medication, call the clinic.   BELOW ARE SYMPTOMS THAT SHOULD BE REPORTED IMMEDIATELY:  *FEVER GREATER THAN 100.5 F  *CHILLS WITH OR WITHOUT FEVER  NAUSEA AND VOMITING THAT IS NOT CONTROLLED WITH YOUR NAUSEA MEDICATION  *UNUSUAL SHORTNESS OF BREATH  *UNUSUAL BRUISING OR BLEEDING  TENDERNESS IN MOUTH AND THROAT WITH OR WITHOUT PRESENCE OF ULCERS  *URINARY PROBLEMS  *BOWEL PROBLEMS  UNUSUAL RASH Items with * indicate a potential emergency and should be followed up as soon as possible.  Feel free to call the clinic should you have any questions or concerns. The clinic phone number is [phone].  Please show the [facility] at check-in to the Emergency Department and triage nurse.

## 2018-02-03 NOTE — Telephone Encounter (Signed)
Scheduled appt per 5/29 los - patient already has all appts scheduled .

## 2018-02-04 ENCOUNTER — Ambulatory Visit
Admission: RE | Admit: 2018-02-04 | Discharge: 2018-02-04 | Disposition: A | Discharge status: Home / Self Care | Payer: Medicare Other | Source: Ambulatory Visit | Attending: Radiation Oncology | Admitting: Radiation Oncology

## 2018-02-04 DIAGNOSIS — Z510 Encounter for antineoplastic radiation therapy: Secondary | ICD-10-CM | POA: Diagnosis not present

## 2018-02-04 DIAGNOSIS — C160 Malignant neoplasm of cardia: Secondary | ICD-10-CM | POA: Diagnosis not present

## 2018-02-05 ENCOUNTER — Ambulatory Visit: Payer: Medicare Other | Admitting: Interventional Cardiology

## 2018-02-05 ENCOUNTER — Ambulatory Visit
Admission: RE | Admit: 2018-02-05 | Discharge: 2018-02-05 | Disposition: A | Discharge status: Home / Self Care | Payer: Medicare Other | Source: Ambulatory Visit | Attending: Radiation Oncology | Admitting: Radiation Oncology

## 2018-02-05 ENCOUNTER — Encounter

## 2018-02-05 DIAGNOSIS — Z510 Encounter for antineoplastic radiation therapy: Secondary | ICD-10-CM | POA: Diagnosis not present

## 2018-02-05 DIAGNOSIS — C160 Malignant neoplasm of cardia: Secondary | ICD-10-CM | POA: Diagnosis not present

## 2018-02-05 MED ORDER — SONAFINE EX EMUL
1.0000 "application " | Freq: Once | CUTANEOUS | Status: AC
  Administered 2018-02-05: 1 via TOPICAL

## 2018-02-05 NOTE — Progress Notes (Signed)
Pt here for patient teaching.  Pt given Radiation and You booklet and Sonafine.  Reviewed areas of pertinence such as diarrhea, fatigue, hair loss, nausea and vomiting, skin changes and urinary and bladder changes . Pt able to give teach back of have Imodium on hand,apply Sonafine bid and avoid applying anything to skin within 4 hours of treatment. Pt verbalizes understanding of information given and will contact nursing with any questions or concerns.     Cori Razor, RN

## 2018-02-05 NOTE — Progress Notes (Signed)
  Radiation Oncology         [phone] ________________________________  Name: Larry Bautista MRN: [id number]  Date: 01/26/2018  DOB: Jul 13, 1941  SIMULATION AND TREATMENT PLANNING NOTE  DIAGNOSIS:     ICD-10-CM   1. Gastroesophageal cancer (HCC) C16.0      Site:  Distal esophagus/GE junction  NARRATIVE:  The patient was brought to the [facility].  Identity was confirmed.  All relevant records and images related to the planned course of therapy were reviewed.   Written consent to proceed with treatment was confirmed which was freely given after reviewing the details related to the planned course of therapy had been reviewed with the patient.  Then, the patient was set-up in a stable reproducible  supine position for radiation therapy.  CT images were obtained.  Surface markings were placed.    Medically necessary complex treatment device(s) for immobilization:  Customized vac lock bag.   The CT images were loaded into the planning software.  Then the target and avoidance structures were contoured.  Treatment planning then occurred.  The radiation prescription was entered and confirmed.   I have requested : Intensity Modulated Radiotherapy (IMRT) is medically necessary for this case for the following reason:  Sparing of adjacent critical normal structures to the target region including the spinal cord, heart, and liver.   The patient will undergo daily image guidance to ensure accurate localization of the target, and adequate minimize dose to the normal surrounding structures in close proximity to the target.   PLAN:  The patient will receive 45 Gy in 25 fractions initially. The patient will then receive a 9 gray boost for a total dose of 54 gray.    Special treatment procedure The patient will also receive concurrent chemotherapy during the treatment. The patient may therefore experience increased toxicity or side effects and the patient will be monitored for such  problems. This may require extra lab work as necessary. This therefore constitutes a special treatment procedure.   ________________________________   Jodelle Gross, MD, PhD

## 2018-02-07 ENCOUNTER — Other Ambulatory Visit: Payer: Self-pay | Admitting: Oncology

## 2018-02-08 ENCOUNTER — Ambulatory Visit
Admission: RE | Admit: 2018-02-08 | Discharge: 2018-02-08 | Disposition: A | Discharge status: Home / Self Care | Payer: Medicare Other | Source: Ambulatory Visit | Attending: Radiation Oncology | Admitting: Radiation Oncology

## 2018-02-08 DIAGNOSIS — C160 Malignant neoplasm of cardia: Secondary | ICD-10-CM | POA: Diagnosis not present

## 2018-02-08 DIAGNOSIS — Z510 Encounter for antineoplastic radiation therapy: Secondary | ICD-10-CM | POA: Insufficient documentation

## 2018-02-09 ENCOUNTER — Ambulatory Visit
Admission: RE | Admit: 2018-02-09 | Discharge: 2018-02-09 | Disposition: A | Discharge status: Home / Self Care | Payer: Medicare Other | Source: Ambulatory Visit | Attending: Radiation Oncology | Admitting: Radiation Oncology

## 2018-02-09 DIAGNOSIS — Z510 Encounter for antineoplastic radiation therapy: Secondary | ICD-10-CM | POA: Diagnosis not present

## 2018-02-09 DIAGNOSIS — C160 Malignant neoplasm of cardia: Secondary | ICD-10-CM | POA: Diagnosis not present

## 2018-02-10 ENCOUNTER — Inpatient Hospital Stay: Payer: Medicare Other | Attending: Oncology

## 2018-02-10 ENCOUNTER — Encounter: Payer: Self-pay | Admitting: Oncology

## 2018-02-10 ENCOUNTER — Inpatient Hospital Stay: Payer: Medicare Other

## 2018-02-10 ENCOUNTER — Ambulatory Visit
Admission: RE | Admit: 2018-02-10 | Discharge: 2018-02-10 | Disposition: A | Discharge status: Home / Self Care | Payer: Medicare Other | Source: Ambulatory Visit | Attending: Radiation Oncology | Admitting: Radiation Oncology

## 2018-02-10 ENCOUNTER — Inpatient Hospital Stay: Payer: Medicare Other | Admitting: Nutrition

## 2018-02-10 VITALS — BP 96/65 | HR 72 | Temp 97.8°F | Resp 18 | Wt 185.0 lb

## 2018-02-10 DIAGNOSIS — K210 Gastro-esophageal reflux disease with esophagitis: Secondary | ICD-10-CM | POA: Diagnosis not present

## 2018-02-10 DIAGNOSIS — R4702 Dysphasia: Secondary | ICD-10-CM | POA: Diagnosis not present

## 2018-02-10 DIAGNOSIS — K922 Gastrointestinal hemorrhage, unspecified: Secondary | ICD-10-CM | POA: Insufficient documentation

## 2018-02-10 DIAGNOSIS — Z5111 Encounter for antineoplastic chemotherapy: Secondary | ICD-10-CM | POA: Insufficient documentation

## 2018-02-10 DIAGNOSIS — E860 Dehydration: Secondary | ICD-10-CM | POA: Diagnosis not present

## 2018-02-10 DIAGNOSIS — N401 Enlarged prostate with lower urinary tract symptoms: Secondary | ICD-10-CM

## 2018-02-10 DIAGNOSIS — I480 Paroxysmal atrial fibrillation: Secondary | ICD-10-CM | POA: Diagnosis not present

## 2018-02-10 DIAGNOSIS — K208 Other esophagitis: Secondary | ICD-10-CM | POA: Diagnosis not present

## 2018-02-10 DIAGNOSIS — Z87891 Personal history of nicotine dependence: Secondary | ICD-10-CM | POA: Diagnosis not present

## 2018-02-10 DIAGNOSIS — Z87442 Personal history of urinary calculi: Secondary | ICD-10-CM | POA: Insufficient documentation

## 2018-02-10 DIAGNOSIS — R972 Elevated prostate specific antigen [PSA]: Secondary | ICD-10-CM | POA: Insufficient documentation

## 2018-02-10 DIAGNOSIS — Z7901 Long term (current) use of anticoagulants: Secondary | ICD-10-CM | POA: Insufficient documentation

## 2018-02-10 DIAGNOSIS — C160 Malignant neoplasm of cardia: Secondary | ICD-10-CM | POA: Insufficient documentation

## 2018-02-10 DIAGNOSIS — R351 Nocturia: Secondary | ICD-10-CM

## 2018-02-10 DIAGNOSIS — G893 Neoplasm related pain (acute) (chronic): Secondary | ICD-10-CM | POA: Insufficient documentation

## 2018-02-10 DIAGNOSIS — Z510 Encounter for antineoplastic radiation therapy: Secondary | ICD-10-CM | POA: Diagnosis not present

## 2018-02-10 LAB — CMP (CANCER CENTER ONLY)
ALBUMIN: 3.9 g/dL (ref 3.5–5.0)
ALT: 13 U/L (ref 0–55)
AST: 19 U/L (ref 5–34)
Alkaline Phosphatase: 54 U/L (ref 40–150)
Anion gap: 8 (ref 3–11)
BILIRUBIN TOTAL: 0.5 mg/dL (ref 0.2–1.2)
BUN: 18 mg/dL (ref 7–26)
CALCIUM: 9.4 mg/dL (ref 8.4–10.4)
CO2: 25 mmol/L (ref 22–29)
Chloride: 106 mmol/L (ref 98–109)
Creatinine: 0.76 mg/dL (ref 0.70–1.30)
GFR, Est AFR Am: >60 mL/min (ref >60)
GFR, Estimated: >60 mL/min (ref >60)
GLUCOSE: 89 mg/dL (ref 70–140)
Potassium: 4.5 mmol/L (ref 3.5–5.1)
Sodium: 139 mmol/L (ref 136–145)
TOTAL PROTEIN: 6.4 g/dL (ref 6.4–8.3)

## 2018-02-10 LAB — CBC WITH DIFFERENTIAL (CANCER CENTER ONLY)
BASOS ABS: 0 10*3/uL (ref 0.0–0.1)
BASOS PCT: 1 %
Eosinophils Absolute: 0.2 10*3/uL (ref 0.0–0.5)
Eosinophils Relative: 4 %
HEMATOCRIT: 40.2 % (ref 38.4–49.9)
HEMOGLOBIN: 13.2 g/dL (ref 13.0–17.1)
Lymphocytes Relative: 11 %
Lymphs Abs: 0.6 10*3/uL — ABNORMAL LOW (ref 0.9–3.3)
MCH: 31.8 pg (ref 27.2–33.4)
MCHC: 32.8 g/dL (ref 32.0–36.0)
MCV: 96.9 fL (ref 79.3–98.0)
MONOS PCT: 4 %
Monocytes Absolute: 0.3 10*3/uL (ref 0.1–0.9)
NEUTROS PCT: 80 %
Neutro Abs: 4.6 10*3/uL (ref 1.5–6.5)
Platelet Count: 264 10*3/uL (ref 140–400)
RBC: 4.15 MIL/uL — AB (ref 4.20–5.82)
RDW: 15.4 % — ABNORMAL HIGH (ref 11.0–14.6)
WBC: 5.8 10*3/uL (ref 4.0–10.3)

## 2018-02-10 MED ORDER — PALONOSETRON HCL INJECTION 0.25 MG/5ML
0.2500 mg | Freq: Once | INTRAVENOUS | Status: AC
  Administered 2018-02-10: 0.25 mg via INTRAVENOUS

## 2018-02-10 MED ORDER — DIPHENHYDRAMINE HCL 50 MG/ML IJ SOLN
INTRAMUSCULAR | Status: AC
  Filled 2018-02-10: qty 1

## 2018-02-10 MED ORDER — PALONOSETRON HCL INJECTION 0.25 MG/5ML
INTRAVENOUS | Status: AC
  Filled 2018-02-10: qty 5

## 2018-02-10 MED ORDER — SODIUM CHLORIDE 0.9 % IV SOLN
50.0000 mg/m2 | Freq: Once | INTRAVENOUS | Status: AC
  Administered 2018-02-10: 102 mg via INTRAVENOUS
  Filled 2018-02-10: qty 17

## 2018-02-10 MED ORDER — DEXAMETHASONE SODIUM PHOSPHATE 10 MG/ML IJ SOLN
INTRAMUSCULAR | Status: AC
  Filled 2018-02-10: qty 1

## 2018-02-10 MED ORDER — SODIUM CHLORIDE 0.9 % IV SOLN
Freq: Once | INTRAVENOUS | Status: AC
  Administered 2018-02-10: 14:00:00 via INTRAVENOUS

## 2018-02-10 MED ORDER — FAMOTIDINE IN NACL 20-0.9 MG/50ML-% IV SOLN
INTRAVENOUS | Status: AC
  Filled 2018-02-10: qty 50

## 2018-02-10 MED ORDER — SODIUM CHLORIDE 0.9 % IV SOLN
200.8000 mg | Freq: Once | INTRAVENOUS | Status: AC
  Administered 2018-02-10: 200 mg via INTRAVENOUS
  Filled 2018-02-10: qty 20

## 2018-02-10 MED ORDER — DEXAMETHASONE SODIUM PHOSPHATE 10 MG/ML IJ SOLN
10.0000 mg | Freq: Once | INTRAMUSCULAR | Status: AC
  Administered 2018-02-10: 10 mg via INTRAVENOUS

## 2018-02-10 MED ORDER — FAMOTIDINE IN NACL 20-0.9 MG/50ML-% IV SOLN
20.0000 mg | Freq: Once | INTRAVENOUS | Status: AC
  Administered 2018-02-10: 20 mg via INTRAVENOUS

## 2018-02-10 MED ORDER — DIPHENHYDRAMINE HCL 50 MG/ML IJ SOLN
25.0000 mg | Freq: Once | INTRAMUSCULAR | Status: AC
  Administered 2018-02-10: 25 mg via INTRAVENOUS

## 2018-02-10 NOTE — Progress Notes (Signed)
Nutrition follow-up completed with patient receiving treatment for esophageal mass at GE junction. Weight was documented as 185 pounds on June 5 decreased from 189.2 pounds May 29. Patient denies nutrition impact symptoms.  Reports he has no difficulty chewing and swallowing.  He has no nutrition concerns.  Nutrition diagnosis: Food and nutrition related knowledge deficit improved.  Intervention: Patient was educated to continue strategies for increasing calories and protein to minimize further weight loss. Reviewed importance of weight maintenance. Questions were answered.  Teach back method used.  Monitoring, evaluation, goals: Patient will tolerate increased calories and protein to minimize weight loss.  Next visit: Wednesday, June 12 during infusion.  **Disclaimer: This note was dictated with voice recognition software. Similar sounding words can inadvertently be transcribed and this note may contain transcription errors which may not have been corrected upon publication of note.**

## 2018-02-10 NOTE — Patient Instructions (Signed)
[facility] Discharge Instructions for Patients Receiving Chemotherapy  Today you received the following chemotherapy agents Taxol, carboplatin To help prevent nausea and vomiting after your treatment, we encourage you to take your nausea medication as directed If you develop nausea and vomiting that is not controlled by your nausea medication, call the clinic.   BELOW ARE SYMPTOMS THAT SHOULD BE REPORTED IMMEDIATELY:  *FEVER GREATER THAN 100.5 F  *CHILLS WITH OR WITHOUT FEVER  NAUSEA AND VOMITING THAT IS NOT CONTROLLED WITH YOUR NAUSEA MEDICATION  *UNUSUAL SHORTNESS OF BREATH  *UNUSUAL BRUISING OR BLEEDING  TENDERNESS IN MOUTH AND THROAT WITH OR WITHOUT PRESENCE OF ULCERS  *URINARY PROBLEMS  *BOWEL PROBLEMS  UNUSUAL RASH Items with * indicate a potential emergency and should be followed up as soon as possible.  Feel free to call the clinic should you have any questions or concerns. The clinic phone number is [phone].  Please show the CHEMO ALERT CARD at check-in to the Emergency Department and triage nurse.   

## 2018-02-10 NOTE — Progress Notes (Signed)
Met w/ pt to introduce myself as his Arboriculturist.  Pt has 2 insurances so copay assistance isn't needed.  I informed him of the [place], went over what it covers and gave him the income requirement.  Pt stated he exceeds the requirement so he doesn't qualify for the grant.  Pt has a cancer policy and wanted info to billing so I gave him the numbers for hospital billing and Ailene Ravel in the radiation department.  He has my card for any questions or concerns he may need in the future.

## 2018-02-11 ENCOUNTER — Other Ambulatory Visit: Payer: Self-pay | Admitting: Oncology

## 2018-02-11 ENCOUNTER — Ambulatory Visit
Admission: RE | Admit: 2018-02-11 | Discharge: 2018-02-11 | Disposition: A | Discharge status: Home / Self Care | Payer: Medicare Other | Source: Ambulatory Visit | Attending: Radiation Oncology | Admitting: Radiation Oncology

## 2018-02-11 DIAGNOSIS — C160 Malignant neoplasm of cardia: Secondary | ICD-10-CM | POA: Diagnosis not present

## 2018-02-11 DIAGNOSIS — Z510 Encounter for antineoplastic radiation therapy: Secondary | ICD-10-CM | POA: Diagnosis not present

## 2018-02-11 LAB — PROSTATE-SPECIFIC AG, SERUM (LABCORP): Prostate Specific Ag, Serum: 3.6 ng/mL (ref 0.0–4.0)

## 2018-02-12 ENCOUNTER — Ambulatory Visit
Admission: RE | Admit: 2018-02-12 | Discharge: 2018-02-12 | Disposition: A | Discharge status: Home / Self Care | Payer: Medicare Other | Source: Ambulatory Visit | Attending: Radiation Oncology | Admitting: Radiation Oncology

## 2018-02-12 DIAGNOSIS — Z510 Encounter for antineoplastic radiation therapy: Secondary | ICD-10-CM | POA: Diagnosis not present

## 2018-02-12 DIAGNOSIS — C160 Malignant neoplasm of cardia: Secondary | ICD-10-CM | POA: Diagnosis not present

## 2018-02-14 ENCOUNTER — Other Ambulatory Visit: Payer: Self-pay | Admitting: Oncology

## 2018-02-15 ENCOUNTER — Ambulatory Visit
Admission: RE | Admit: 2018-02-15 | Discharge: 2018-02-15 | Disposition: A | Discharge status: Home / Self Care | Payer: Medicare Other | Source: Ambulatory Visit | Attending: Radiation Oncology | Admitting: Radiation Oncology

## 2018-02-15 DIAGNOSIS — Z510 Encounter for antineoplastic radiation therapy: Secondary | ICD-10-CM | POA: Diagnosis not present

## 2018-02-15 DIAGNOSIS — C160 Malignant neoplasm of cardia: Secondary | ICD-10-CM | POA: Diagnosis not present

## 2018-02-16 ENCOUNTER — Ambulatory Visit
Admission: RE | Admit: 2018-02-16 | Discharge: 2018-02-16 | Disposition: A | Discharge status: Home / Self Care | Payer: Medicare Other | Source: Ambulatory Visit | Attending: Radiation Oncology | Admitting: Radiation Oncology

## 2018-02-16 DIAGNOSIS — Z510 Encounter for antineoplastic radiation therapy: Secondary | ICD-10-CM | POA: Diagnosis not present

## 2018-02-16 DIAGNOSIS — C160 Malignant neoplasm of cardia: Secondary | ICD-10-CM | POA: Diagnosis not present

## 2018-02-17 ENCOUNTER — Inpatient Hospital Stay (HOSPITAL_BASED_OUTPATIENT_CLINIC_OR_DEPARTMENT_OTHER): Payer: Medicare Other | Admitting: Oncology

## 2018-02-17 ENCOUNTER — Inpatient Hospital Stay: Payer: Medicare Other

## 2018-02-17 ENCOUNTER — Inpatient Hospital Stay: Payer: Medicare Other | Admitting: Nutrition

## 2018-02-17 ENCOUNTER — Ambulatory Visit
Admission: RE | Admit: 2018-02-17 | Discharge: 2018-02-17 | Disposition: A | Discharge status: Home / Self Care | Payer: Medicare Other | Source: Ambulatory Visit | Attending: Radiation Oncology | Admitting: Radiation Oncology

## 2018-02-17 VITALS — BP 120/79 | HR 61 | Temp 97.6°F | Resp 18 | Ht 68.0 in | Wt 180.9 lb

## 2018-02-17 DIAGNOSIS — R972 Elevated prostate specific antigen [PSA]: Secondary | ICD-10-CM | POA: Diagnosis not present

## 2018-02-17 DIAGNOSIS — K922 Gastrointestinal hemorrhage, unspecified: Secondary | ICD-10-CM | POA: Diagnosis not present

## 2018-02-17 DIAGNOSIS — I480 Paroxysmal atrial fibrillation: Secondary | ICD-10-CM

## 2018-02-17 DIAGNOSIS — Z87891 Personal history of nicotine dependence: Secondary | ICD-10-CM | POA: Diagnosis not present

## 2018-02-17 DIAGNOSIS — E860 Dehydration: Secondary | ICD-10-CM | POA: Diagnosis not present

## 2018-02-17 DIAGNOSIS — Z510 Encounter for antineoplastic radiation therapy: Secondary | ICD-10-CM | POA: Diagnosis not present

## 2018-02-17 DIAGNOSIS — Z87442 Personal history of urinary calculi: Secondary | ICD-10-CM

## 2018-02-17 DIAGNOSIS — Z5111 Encounter for antineoplastic chemotherapy: Secondary | ICD-10-CM | POA: Diagnosis not present

## 2018-02-17 DIAGNOSIS — C160 Malignant neoplasm of cardia: Secondary | ICD-10-CM

## 2018-02-17 DIAGNOSIS — K208 Other esophagitis: Secondary | ICD-10-CM | POA: Diagnosis not present

## 2018-02-17 DIAGNOSIS — G893 Neoplasm related pain (acute) (chronic): Secondary | ICD-10-CM | POA: Diagnosis not present

## 2018-02-17 DIAGNOSIS — Z7901 Long term (current) use of anticoagulants: Secondary | ICD-10-CM

## 2018-02-17 DIAGNOSIS — R4702 Dysphasia: Secondary | ICD-10-CM

## 2018-02-17 DIAGNOSIS — K210 Gastro-esophageal reflux disease with esophagitis: Secondary | ICD-10-CM | POA: Diagnosis not present

## 2018-02-17 LAB — CMP (CANCER CENTER ONLY)
ALT: 12 U/L (ref 0–55)
AST: 17 U/L (ref 5–34)
Albumin: 4.1 g/dL (ref 3.5–5.0)
Alkaline Phosphatase: 51 U/L (ref 40–150)
Anion gap: 8 (ref 3–11)
BUN: 14 mg/dL (ref 7–26)
CHLORIDE: 106 mmol/L (ref 98–109)
CO2: 27 mmol/L (ref 22–29)
Calcium: 9.7 mg/dL (ref 8.4–10.4)
Creatinine: 0.76 mg/dL (ref 0.70–1.30)
GFR, Est AFR Am: >60 mL/min (ref >60)
Glucose, Bld: 90 mg/dL (ref 70–140)
POTASSIUM: 4.3 mmol/L (ref 3.5–5.1)
SODIUM: 141 mmol/L (ref 136–145)
Total Bilirubin: 0.5 mg/dL (ref 0.2–1.2)
Total Protein: 6.6 g/dL (ref 6.4–8.3)

## 2018-02-17 LAB — CBC WITH DIFFERENTIAL (CANCER CENTER ONLY)
Basophils Absolute: 0 10*3/uL (ref 0.0–0.1)
Basophils Relative: 1 %
EOS ABS: 0.1 10*3/uL (ref 0.0–0.5)
EOS PCT: 4 %
HCT: 41.5 % (ref 38.4–49.9)
Hemoglobin: 13.6 g/dL (ref 13.0–17.1)
LYMPHS PCT: 13 %
Lymphs Abs: 0.5 10*3/uL — ABNORMAL LOW (ref 0.9–3.3)
MCH: 31.8 pg (ref 27.2–33.4)
MCHC: 32.8 g/dL (ref 32.0–36.0)
MCV: 97.1 fL (ref 79.3–98.0)
MONO ABS: 0.5 10*3/uL (ref 0.1–0.9)
Monocytes Relative: 13 %
Neutro Abs: 2.4 10*3/uL (ref 1.5–6.5)
Neutrophils Relative %: 69 %
PLATELETS: 216 10*3/uL (ref 140–400)
RBC: 4.28 MIL/uL (ref 4.20–5.82)
RDW: 15 % — AB (ref 11.0–14.6)
WBC Count: 3.6 10*3/uL — ABNORMAL LOW (ref 4.0–10.3)

## 2018-02-17 MED ORDER — SODIUM CHLORIDE 0.9 % IV SOLN
200.8000 mg | Freq: Once | INTRAVENOUS | Status: AC
  Administered 2018-02-17: 200 mg via INTRAVENOUS
  Filled 2018-02-17: qty 20

## 2018-02-17 MED ORDER — SODIUM CHLORIDE 0.9 % IV SOLN
50.0000 mg/m2 | Freq: Once | INTRAVENOUS | Status: AC
  Administered 2018-02-17: 102 mg via INTRAVENOUS
  Filled 2018-02-17: qty 17

## 2018-02-17 MED ORDER — DEXAMETHASONE SODIUM PHOSPHATE 10 MG/ML IJ SOLN
INTRAMUSCULAR | Status: AC
  Filled 2018-02-17: qty 1

## 2018-02-17 MED ORDER — FAMOTIDINE IN NACL 20-0.9 MG/50ML-% IV SOLN
INTRAVENOUS | Status: AC
Start: 2018-02-17 — End: ?
  Filled 2018-02-17: qty 50

## 2018-02-17 MED ORDER — DIPHENHYDRAMINE HCL 50 MG/ML IJ SOLN
25.0000 mg | Freq: Once | INTRAMUSCULAR | Status: AC
  Administered 2018-02-17: 25 mg via INTRAVENOUS

## 2018-02-17 MED ORDER — SUCRALFATE 1 GM/10ML PO SUSP: 1.0000 g | Freq: Three times a day (TID) | ORAL | 0 refills | Status: DC

## 2018-02-17 MED ORDER — DEXAMETHASONE SODIUM PHOSPHATE 10 MG/ML IJ SOLN
10.0000 mg | Freq: Once | INTRAMUSCULAR | Status: AC
  Administered 2018-02-17: 10 mg via INTRAVENOUS

## 2018-02-17 MED ORDER — PALONOSETRON HCL INJECTION 0.25 MG/5ML
0.2500 mg | Freq: Once | INTRAVENOUS | Status: AC
  Administered 2018-02-17: 0.25 mg via INTRAVENOUS

## 2018-02-17 MED ORDER — FAMOTIDINE IN NACL 20-0.9 MG/50ML-% IV SOLN
20.0000 mg | Freq: Once | INTRAVENOUS | Status: AC
  Administered 2018-02-17: 20 mg via INTRAVENOUS

## 2018-02-17 MED ORDER — PANTOPRAZOLE SODIUM 40 MG PO TBEC: 40.0000 mg | DELAYED_RELEASE_TABLET | Freq: Every day | ORAL | 1 refills | Status: DC

## 2018-02-17 MED ORDER — PALONOSETRON HCL INJECTION 0.25 MG/5ML
INTRAVENOUS | Status: AC
Start: 2018-02-17 — End: ?
  Filled 2018-02-17: qty 5

## 2018-02-17 MED ORDER — SODIUM CHLORIDE 0.9 % IV SOLN
Freq: Once | INTRAVENOUS | Status: AC
  Administered 2018-02-17: 14:00:00 via INTRAVENOUS

## 2018-02-17 MED ORDER — DIPHENHYDRAMINE HCL 50 MG/ML IJ SOLN
INTRAMUSCULAR | Status: AC
  Filled 2018-02-17: qty 1

## 2018-02-17 NOTE — Patient Instructions (Signed)
   [facility] Discharge Instructions for Patients Receiving Chemotherapy  Today you received the following chemotherapy agents Taxol and Carboplatin   To help prevent nausea and vomiting after your treatment, we encourage you to take your nausea medication as directed.    If you develop nausea and vomiting that is not controlled by your nausea medication, call the clinic.   BELOW ARE SYMPTOMS THAT SHOULD BE REPORTED IMMEDIATELY:  *FEVER GREATER THAN 100.5 F  *CHILLS WITH OR WITHOUT FEVER  NAUSEA AND VOMITING THAT IS NOT CONTROLLED WITH YOUR NAUSEA MEDICATION  *UNUSUAL SHORTNESS OF BREATH  *UNUSUAL BRUISING OR BLEEDING  TENDERNESS IN MOUTH AND THROAT WITH OR WITHOUT PRESENCE OF ULCERS  *URINARY PROBLEMS  *BOWEL PROBLEMS  UNUSUAL RASH Items with * indicate a potential emergency and should be followed up as soon as possible.  Feel free to call the clinic should you have any questions or concerns. The clinic phone number is [phone].  Please show the CHEMO ALERT CARD at check-in to the Emergency Department and triage nurse.   

## 2018-02-17 NOTE — Progress Notes (Signed)
Nutrition follow-up completed with patient receiving treatment for esophageal mass at the GE junction. Weight decreased was documented as 180.9 pounds on June 12 down from 185 pounds June 5. Patient reports he has some difficulty swallowing. He continues to try to adapt textures for easier swallowing.  Nutrition diagnosis: Food and nutrition related knowledge deficit improved.  Intervention: Educated patient to continue to modify textures and temperatures of foods for easier swallowing. Provided support and encouragement for weight maintenance. Teach back method used.  Monitoring, evaluation, goals: Patient will tolerate adequate calories and protein to minimize weight loss.  Next visit: Wednesday, June 19 during infusion.  **Disclaimer: This note was dictated with voice recognition software. Similar sounding words can inadvertently be transcribed and this note may contain transcription errors which may not have been corrected upon publication of note.**

## 2018-02-17 NOTE — Progress Notes (Signed)
  Larry Bautista OFFICE PROGRESS NOTE   Diagnosis: Gastroesophageal cancer  INTERVAL HISTORY:   Larry Bautista returns as scheduled.  He began concurrent Taxol/carboplatin and radiation 02/03/2018.  He reports tolerating the chemotherapy well.  No symptom of an allergic reaction.  He reports mild "stinging "in a few fingers, but no consistent neuropathy symptoms.  No bleeding.  He has developed burning discomfort at the throat and upper chest after eating.  He has mild dysphasia.  The PSA returned normal on 02/10/2018.  Dr. Junious Silk will evaluate the prostate when he sees Larry Bautista in July.  Objective:  Vital signs in last 24 hours:  Blood pressure 120/79, pulse 61, temperature 97.6 F (36.4 C), temperature source Oral, resp. rate 18, height 5\' 8"  (1.727 m), weight 180 lb 14.4 oz (82.1 kg), SpO2 96 %.    HEENT: No thrush or ulcers Resp: Lungs clear bilaterally Cardio: Irregular GI: Nontender, no hepatomegaly Vascular: Leg edema   Lab Results:  Lab Results  Component Value Date   WBC 3.6 (L) 02/17/2018   HGB 13.6 02/17/2018   HCT 41.5 02/17/2018   MCV 97.1 02/17/2018   PLT 216 02/17/2018   NEUTROABS 2.4 02/17/2018    CMP  Lab Results  Component Value Date   NA 139 02/10/2018   K 4.5 02/10/2018   CL 106 02/10/2018   CO2 25 02/10/2018   GLUCOSE 89 02/10/2018   BUN 18 02/10/2018   CREATININE 0.76 02/10/2018   CALCIUM 9.4 02/10/2018   PROT 6.4 02/10/2018   ALBUMIN 3.9 02/10/2018   AST 19 02/10/2018   ALT 13 02/10/2018   ALKPHOS 54 02/10/2018   BILITOT 0.5 02/10/2018   GFRNONAA >60 02/10/2018   GFRAA >60 02/10/2018    Lab Results  Component Value Date   CEA1 <1.00 01/19/2018     Medications: I have reviewed the patient's current medications.   Assessment/Plan: 1. Adenocarcinoma of the GE junction ? Partially obstructing mass noted at the GE junction on endoscopy 01/07/2018, nonobstructing ? CTs 01/14/2018, sub-solid 5 mm right upper lobe nodule,  mild eccentric wall thickening at the GE junction ? PET scan 01/27/2018-hypermetabolism at the GE junction, no evidence of metastatic disease ? Initiation of radiation and weekly Taxol/carboplatin 02/03/2018 2. GI bleeding secondary to #1 3. Atrial fibrillation-maintained on Coumadin prior to hospital admission 01/07/2018 4. Coronary artery disease 5. Chronic exertional dyspnea-likely COPD 6. History of tobacco use 7. History of kidney stones 8. Focus of hypermetabolism at the right prostate on the PET scan 01/27/2018, PSA normal on 02/10/2018   Disposition: Larry Bautista has completed 2 weeks of Taxol/carboplatin chemotherapy.  He has tolerated the chemotherapy well.  He appears to be developing early esophagitis symptoms.  He will discontinue Prilosec.  He will begin Protonix and Carafate slurry.  He will discontinue iron therapy.  Larry Bautista remains off of anticoagulation therapy secondary to the GI bleeding in May.  I encouraged him to eat frequent small meals and begin nutrition supplements.  He will return for an office visit in 1 week.  Betsy Coder, MD  02/17/2018  11:42 AM

## 2018-02-18 ENCOUNTER — Ambulatory Visit
Admission: RE | Admit: 2018-02-18 | Discharge: 2018-02-18 | Disposition: A | Discharge status: Home / Self Care | Payer: Medicare Other | Source: Ambulatory Visit | Attending: Radiation Oncology | Admitting: Radiation Oncology

## 2018-02-18 DIAGNOSIS — Z510 Encounter for antineoplastic radiation therapy: Secondary | ICD-10-CM | POA: Diagnosis not present

## 2018-02-18 DIAGNOSIS — C160 Malignant neoplasm of cardia: Secondary | ICD-10-CM | POA: Diagnosis not present

## 2018-02-19 ENCOUNTER — Ambulatory Visit
Admission: RE | Admit: 2018-02-19 | Discharge: 2018-02-19 | Disposition: A | Discharge status: Home / Self Care | Payer: Medicare Other | Source: Ambulatory Visit | Attending: Radiation Oncology | Admitting: Radiation Oncology

## 2018-02-19 DIAGNOSIS — C160 Malignant neoplasm of cardia: Secondary | ICD-10-CM | POA: Diagnosis not present

## 2018-02-19 DIAGNOSIS — Z510 Encounter for antineoplastic radiation therapy: Secondary | ICD-10-CM | POA: Diagnosis not present

## 2018-02-21 ENCOUNTER — Other Ambulatory Visit: Payer: Self-pay | Admitting: Oncology

## 2018-02-22 ENCOUNTER — Other Ambulatory Visit: Payer: Self-pay | Admitting: Radiation Oncology

## 2018-02-22 ENCOUNTER — Ambulatory Visit
Admission: RE | Admit: 2018-02-22 | Discharge: 2018-02-22 | Disposition: A | Discharge status: Home / Self Care | Payer: Medicare Other | Source: Ambulatory Visit | Attending: Radiation Oncology | Admitting: Radiation Oncology

## 2018-02-22 DIAGNOSIS — C160 Malignant neoplasm of cardia: Secondary | ICD-10-CM | POA: Diagnosis not present

## 2018-02-22 DIAGNOSIS — Z510 Encounter for antineoplastic radiation therapy: Secondary | ICD-10-CM | POA: Diagnosis not present

## 2018-02-22 MED ORDER — ONDANSETRON 8 MG PO TBDP: 8.0000 mg | ORAL_TABLET | Freq: Three times a day (TID) | ORAL | 0 refills | Status: DC | PRN

## 2018-02-22 NOTE — Progress Notes (Signed)
Patient came around to the clinic following his radiation treatment with complaints of nausea and jaw pain.  Vital signs stable, no complaints of chest pain voiced.  Discussed the patients concerns with PA Dara Lords, prescription was sent to the pharmacy for zofran and the patient was advised to contact his primary care provider for the jaw pain.  Patient voiced that his PCP was in [place] "long drive".  His wife stated he has a history or a fib and that he has a stent.  She asked if he should contact his cardiologist.  I let them know that that would be a good idea just to keep everyone in the loop and to give them peace of mind.  Will continue to follow as necessary.  Gloriajean Dell. Leonie Green, BSN

## 2018-02-23 ENCOUNTER — Ambulatory Visit
Admission: RE | Admit: 2018-02-23 | Discharge: 2018-02-23 | Disposition: A | Discharge status: Home / Self Care | Payer: Medicare Other | Source: Ambulatory Visit | Attending: Radiation Oncology | Admitting: Radiation Oncology

## 2018-02-23 DIAGNOSIS — Z510 Encounter for antineoplastic radiation therapy: Secondary | ICD-10-CM | POA: Diagnosis not present

## 2018-02-23 DIAGNOSIS — C160 Malignant neoplasm of cardia: Secondary | ICD-10-CM | POA: Diagnosis not present

## 2018-02-24 ENCOUNTER — Telehealth: Payer: Self-pay | Admitting: Nurse Practitioner

## 2018-02-24 ENCOUNTER — Other Ambulatory Visit: Payer: Medicare Other

## 2018-02-24 ENCOUNTER — Encounter: Payer: Self-pay | Admitting: Nurse Practitioner

## 2018-02-24 ENCOUNTER — Ambulatory Visit
Admission: RE | Admit: 2018-02-24 | Discharge: 2018-02-24 | Disposition: A | Discharge status: Home / Self Care | Payer: Medicare Other | Source: Ambulatory Visit | Attending: Radiation Oncology | Admitting: Radiation Oncology

## 2018-02-24 ENCOUNTER — Inpatient Hospital Stay: Payer: Medicare Other

## 2018-02-24 ENCOUNTER — Inpatient Hospital Stay: Payer: Medicare Other | Admitting: Nutrition

## 2018-02-24 ENCOUNTER — Inpatient Hospital Stay (HOSPITAL_BASED_OUTPATIENT_CLINIC_OR_DEPARTMENT_OTHER): Payer: Medicare Other | Admitting: Nurse Practitioner

## 2018-02-24 VITALS — BP 104/79 | HR 77 | Temp 97.8°F | Resp 17 | Ht 68.0 in | Wt 179.7 lb

## 2018-02-24 DIAGNOSIS — I480 Paroxysmal atrial fibrillation: Secondary | ICD-10-CM

## 2018-02-24 DIAGNOSIS — G893 Neoplasm related pain (acute) (chronic): Secondary | ICD-10-CM

## 2018-02-24 DIAGNOSIS — Z87891 Personal history of nicotine dependence: Secondary | ICD-10-CM

## 2018-02-24 DIAGNOSIS — K208 Other esophagitis: Secondary | ICD-10-CM | POA: Diagnosis not present

## 2018-02-24 DIAGNOSIS — N401 Enlarged prostate with lower urinary tract symptoms: Secondary | ICD-10-CM

## 2018-02-24 DIAGNOSIS — Z87442 Personal history of urinary calculi: Secondary | ICD-10-CM | POA: Diagnosis not present

## 2018-02-24 DIAGNOSIS — K922 Gastrointestinal hemorrhage, unspecified: Secondary | ICD-10-CM

## 2018-02-24 DIAGNOSIS — R4702 Dysphasia: Secondary | ICD-10-CM

## 2018-02-24 DIAGNOSIS — C160 Malignant neoplasm of cardia: Secondary | ICD-10-CM

## 2018-02-24 DIAGNOSIS — Z7901 Long term (current) use of anticoagulants: Secondary | ICD-10-CM | POA: Diagnosis not present

## 2018-02-24 DIAGNOSIS — Z5111 Encounter for antineoplastic chemotherapy: Secondary | ICD-10-CM | POA: Diagnosis not present

## 2018-02-24 DIAGNOSIS — K210 Gastro-esophageal reflux disease with esophagitis: Secondary | ICD-10-CM | POA: Diagnosis not present

## 2018-02-24 DIAGNOSIS — Z510 Encounter for antineoplastic radiation therapy: Secondary | ICD-10-CM | POA: Diagnosis not present

## 2018-02-24 DIAGNOSIS — R351 Nocturia: Secondary | ICD-10-CM

## 2018-02-24 DIAGNOSIS — E860 Dehydration: Secondary | ICD-10-CM | POA: Diagnosis not present

## 2018-02-24 DIAGNOSIS — R972 Elevated prostate specific antigen [PSA]: Secondary | ICD-10-CM | POA: Diagnosis not present

## 2018-02-24 LAB — CBC WITH DIFFERENTIAL (CANCER CENTER ONLY)
Basophils Absolute: 0.1 10*3/uL (ref 0.0–0.1)
Basophils Relative: 1 %
EOS ABS: 0.1 10*3/uL (ref 0.0–0.5)
Eosinophils Relative: 1 %
HCT: 41.6 % (ref 38.4–49.9)
HEMOGLOBIN: 13.7 g/dL (ref 13.0–17.1)
LYMPHS ABS: 0.2 10*3/uL — AB (ref 0.9–3.3)
Lymphocytes Relative: 4 %
MCH: 31.4 pg (ref 27.2–33.4)
MCHC: 32.8 g/dL (ref 32.0–36.0)
MCV: 95.9 fL (ref 79.3–98.0)
Monocytes Absolute: 0.5 10*3/uL (ref 0.1–0.9)
Monocytes Relative: 9 %
NEUTROS PCT: 85 %
Neutro Abs: 5.2 10*3/uL (ref 1.5–6.5)
Platelet Count: 200 10*3/uL (ref 140–400)
RBC: 4.34 MIL/uL (ref 4.20–5.82)
RDW: 15.1 % — ABNORMAL HIGH (ref 11.0–14.6)
WBC: 6.1 10*3/uL (ref 4.0–10.3)

## 2018-02-24 MED ORDER — HYDROCODONE-ACETAMINOPHEN 5-325 MG PO TABS: 1.0000 | ORAL_TABLET | ORAL | 0 refills | Status: DC | PRN

## 2018-02-24 MED ORDER — SODIUM CHLORIDE 0.9 % IV SOLN
INTRAVENOUS | Status: AC
  Administered 2018-02-24: 14:00:00 via INTRAVENOUS

## 2018-02-24 MED ORDER — SODIUM CHLORIDE 0.9 % IV SOLN
50.0000 mg/m2 | Freq: Once | INTRAVENOUS | Status: AC
  Administered 2018-02-24: 102 mg via INTRAVENOUS
  Filled 2018-02-24: qty 17

## 2018-02-24 MED ORDER — DIPHENHYDRAMINE HCL 50 MG/ML IJ SOLN
25.0000 mg | Freq: Once | INTRAMUSCULAR | Status: AC
  Administered 2018-02-24: 25 mg via INTRAVENOUS

## 2018-02-24 MED ORDER — FAMOTIDINE IN NACL 20-0.9 MG/50ML-% IV SOLN
INTRAVENOUS | Status: AC
  Filled 2018-02-24: qty 50

## 2018-02-24 MED ORDER — FAMOTIDINE IN NACL 20-0.9 MG/50ML-% IV SOLN
20.0000 mg | Freq: Once | INTRAVENOUS | Status: AC
  Administered 2018-02-24: 20 mg via INTRAVENOUS

## 2018-02-24 MED ORDER — SODIUM CHLORIDE 0.9 % IV SOLN
Freq: Once | INTRAVENOUS | Status: AC
  Administered 2018-02-24: 13:00:00 via INTRAVENOUS

## 2018-02-24 MED ORDER — PALONOSETRON HCL INJECTION 0.25 MG/5ML
INTRAVENOUS | Status: AC
  Filled 2018-02-24: qty 5

## 2018-02-24 MED ORDER — DEXAMETHASONE SODIUM PHOSPHATE 10 MG/ML IJ SOLN
10.0000 mg | Freq: Once | INTRAMUSCULAR | Status: AC
  Administered 2018-02-24: 10 mg via INTRAVENOUS

## 2018-02-24 MED ORDER — PALONOSETRON HCL INJECTION 0.25 MG/5ML
0.2500 mg | Freq: Once | INTRAVENOUS | Status: AC
  Administered 2018-02-24: 0.25 mg via INTRAVENOUS

## 2018-02-24 MED ORDER — DEXAMETHASONE SODIUM PHOSPHATE 10 MG/ML IJ SOLN
INTRAMUSCULAR | Status: AC
  Filled 2018-02-24: qty 1

## 2018-02-24 MED ORDER — SODIUM CHLORIDE 0.9 % IV SOLN
200.8000 mg | Freq: Once | INTRAVENOUS | Status: AC
  Administered 2018-02-24: 200 mg via INTRAVENOUS
  Filled 2018-02-24: qty 20

## 2018-02-24 MED ORDER — DIPHENHYDRAMINE HCL 50 MG/ML IJ SOLN
INTRAMUSCULAR | Status: AC
  Filled 2018-02-24: qty 1

## 2018-02-24 NOTE — Progress Notes (Addendum)
Larry Bautista OFFICE PROGRESS NOTE   Diagnosis: Gastroesophageal cancer  INTERVAL HISTORY:   Larry Bautista returns as scheduled.  He continues radiation.  He completed week 3 Taxol/carboplatin 02/17/2018.  He denies nausea/vomiting.  No mouth sores.  He tends to have loose stools in the morning hours.  He notes increased pain after swallowing.  He describes the pain as "excruciating" and "like reflux".  He is taking Protonix 40 mg daily.  He tried Carafate but noted no significant difference.  Oral intake is poor due to the pain.  Objective:  Vital signs in last 24 hours:  Blood pressure 104/79, pulse 77, temperature 97.8 F (36.6 C), temperature source Oral, resp. rate 17, height 5\' 8"  (1.727 m), weight 179 lb 11.2 oz (81.5 kg), SpO2 100 %.    HEENT: No thrush or ulcers. Resp: Lungs clear bilaterally. Cardio: Irregular. GI: Abdomen soft and nontender.  No hepatomegaly. Vascular: No leg edema.   Skin: Decreased skin turgor.   Lab Results:  Lab Results  Component Value Date   WBC 6.1 02/24/2018   HGB 13.7 02/24/2018   HCT 41.6 02/24/2018   MCV 95.9 02/24/2018   PLT 200 02/24/2018   NEUTROABS 5.2 02/24/2018    Imaging:  No results found.  Medications: I have reviewed the patient's current medications.  Assessment/Plan: 1. Adenocarcinoma of the GE junction ? Partially obstructing mass noted at the GE junction on endoscopy 01/07/2018, nonobstructing ? CTs 01/14/2018, sub-solid 5 mm right upper lobe nodule, mild eccentric wall thickening at the GE junction ? PET scan 01/27/2018-hypermetabolism at the GE junction, no evidence of metastatic disease ? Initiation of radiation and weekly Taxol/carboplatin 02/03/2018 ? Week 2 Taxol/carboplatin 02/10/2018 ? Week 3 Taxol/carboplatin 02/17/2018 ? Week 4 Taxol/carboplatin 02/24/2018 2. GI bleeding secondary to #1 3. Atrial fibrillation-maintained on Coumadin prior to hospital admission 01/07/2018 4. Coronary artery  disease 5. Chronic exertional dyspnea-likely COPD 6. History of tobacco use 7. History of kidney stones 8. Focus of hypermetabolism at the right prostate on the PET scan 01/27/2018, PSA normal on 02/10/2018   Disposition: Mr. Cothern has completed 3 weeks of Taxol/carboplatin chemotherapy.  He continues radiation.  Plan to proceed with week 4 Taxol/carboplatin today as scheduled.   He is having significant pain with swallowing.  This is most likely due to radiation esophagitis.  He will continue Protonix.  I encouraged him to resume Carafate.  A prescription was sent to his pharmacy for hydrocodone 1 tablet every 4 hours as needed.  He understands he should not be driving while taking pain medication.  He will receive additional IV fluids while here today.  He will try to increase oral intake at home.  He is meeting with the [facility] dietitian today.  He will return for a follow-up visit on 03/01/2018.  He will contact the office in the interim with any problems.  Patient seen with Dr. Benay Spice.  25 minutes were spent face-to-face at today's visit with the majority of that time involved in counseling/coordination of care.    Ned Card ANP/GNP-BC   02/24/2018  12:06 PM  With a shared visit with Ned Card.  Mr. Denherder has developed terms of esophagitis.  He will receive intravenous fluids today.  We adjusted the medical regimen.  We encouraged him to increase the use of nutrition supplements.  He will return for an office visit next week.  He knows to contact us if he is unable to tolerate liquids.  Julieanne Manson, MD

## 2018-02-24 NOTE — Telephone Encounter (Signed)
Scheduled appt per 6/19 los - unable to add IVF for 6/24 - capped - logged - will contact patient when all appts scheduled.

## 2018-02-24 NOTE — Patient Instructions (Signed)
[facility] Discharge Instructions for Patients Receiving Chemotherapy  Today you received the following chemotherapy agents Taxol and Carboplatin.   To help prevent nausea and vomiting after your treatment, we encourage you to take your nausea medication as prescribed.    If you develop nausea and vomiting that is not controlled by your nausea medication, call the clinic.   BELOW ARE SYMPTOMS THAT SHOULD BE REPORTED IMMEDIATELY:  *FEVER GREATER THAN 100.5 F  *CHILLS WITH OR WITHOUT FEVER  NAUSEA AND VOMITING THAT IS NOT CONTROLLED WITH YOUR NAUSEA MEDICATION  *UNUSUAL SHORTNESS OF BREATH  *UNUSUAL BRUISING OR BLEEDING  TENDERNESS IN MOUTH AND THROAT WITH OR WITHOUT PRESENCE OF ULCERS  *URINARY PROBLEMS  *BOWEL PROBLEMS  UNUSUAL RASH Items with * indicate a potential emergency and should be followed up as soon as possible.  Feel free to call the clinic should you have any questions or concerns. The clinic phone number is [phone].  Please show the CHEMO ALERT CARD at check-in to the Emergency Department and triage nurse.   

## 2018-02-24 NOTE — Progress Notes (Signed)
Nutrition Follow-up:  Patient with esophageal mass at the GE junction.  Patient followed by Dr. Benay Spice.    Met with patient during infusion this pm.  Patient reports increased pain after swallowing foods due to radiation esophagitis.  Reports that he often gets hiccups and feels like reflux.  Reports only drank 1 boost plus yesterday and drank some koolaid.    Medications: reviewed, adding hydrocodone today  Labs: reviewed  Anthropometrics:   Weight decreased to 179 lb 11.2 oz today from 180.9 pounds on June 12.    UBW 196 lb March 7  NUTRITION DIAGNOSIS: Food and nutrition related knowledge deficit improved.     INTERVENTION:  Encouraged smooth, liquid foods (creamy soups, milkshakes, pudding, etc). Discussed strategies to increase calories and protein. Encouraged boost plus shake at least 3 times per day. Discussed fluids and importance of hydration.      MONITORING, EVALUATION, GOAL: Patient will tolerate adequate calories and protein to minimize weight loss.    NEXT VISIT: Wednesday, June 26 during infusion  Khandi Kernes B. Zenia Resides, [place], [place] Registered Dietitian [phone] (pager)

## 2018-02-25 ENCOUNTER — Ambulatory Visit
Admission: RE | Admit: 2018-02-25 | Discharge: 2018-02-25 | Disposition: A | Discharge status: Home / Self Care | Payer: Medicare Other | Source: Ambulatory Visit | Attending: Radiation Oncology | Admitting: Radiation Oncology

## 2018-02-25 DIAGNOSIS — C160 Malignant neoplasm of cardia: Secondary | ICD-10-CM | POA: Diagnosis not present

## 2018-02-25 DIAGNOSIS — Z510 Encounter for antineoplastic radiation therapy: Secondary | ICD-10-CM | POA: Diagnosis not present

## 2018-02-25 LAB — PROSTATE-SPECIFIC AG, SERUM (LABCORP): Prostate Specific Ag, Serum: 3.2 ng/mL (ref 0.0–4.0)

## 2018-02-26 ENCOUNTER — Ambulatory Visit
Admission: RE | Admit: 2018-02-26 | Discharge: 2018-02-26 | Disposition: A | Discharge status: Home / Self Care | Payer: Medicare Other | Source: Ambulatory Visit | Attending: Radiation Oncology | Admitting: Radiation Oncology

## 2018-02-26 DIAGNOSIS — Z510 Encounter for antineoplastic radiation therapy: Secondary | ICD-10-CM | POA: Diagnosis not present

## 2018-02-26 DIAGNOSIS — C160 Malignant neoplasm of cardia: Secondary | ICD-10-CM | POA: Diagnosis not present

## 2018-02-28 ENCOUNTER — Other Ambulatory Visit: Payer: Self-pay | Admitting: Oncology

## 2018-02-28 NOTE — Progress Notes (Signed)
[place]  [phone] [phone]) [phone]  Clinic Follow up Note   Patient Care Team: [name], MD as PCP - General (Family Medicine) Belva Crome, MD as Consulting Physician (Cardiology) Jovita Gamma, MD as Consulting Physician (Neurosurgery) Festus Aloe, MD as Consulting Physician (Urology) Noralee Space, MD as Consulting Physician (Pulmonary Disease) 03/01/2018  DIAGNOSIS: Gastroesophageal cancer   INTERVAL HISTORY: Larry Bautista returns for follow-up as scheduled.  He feels "fair."  His main complaint is reflux, more severe in the morning but does occur with each p.o. intake.  Yesterday he was able to tolerate regular diet and liquids without much difficulty.  Today his reflux is worse and he has accompanying bilateral lower jaw pain present for a few days.  Pain subsides after eating.  Denies pain with swallowing. His wife notes he is not drinking enough.  He is compliant with Protonix, takes 1 daily.  Occasionally takes 1 teaspoon Maalox before meals but he is unsure if this helps.  He does not take Carafate as he felt this aggravated his symptoms.  Takes 1 hydrocodone per day.   REVIEW OF SYSTEMS:   Constitutional: Denies fevers, chills or abnormal weight loss Ears, nose, mouth, throat, and face: Denies mucositis or sore throat (+) bilateral mandibular pain Respiratory: Denies cough, dyspnea or wheezes Cardiovascular: Denies palpitation, chest discomfort or lower extremity swelling Gastrointestinal:  Denies nausea, vomiting, constipation, diarrhea, or odynophagia (+) reflux Skin: Denies abnormal skin rashes Lymphatics: Denies new lymphadenopathy or easy bruising Neurological:Denies numbness, tingling or new weaknesses Behavioral/Psych: Mood is stable, no new changes  All other systems were reviewed with the patient and are negative.  MEDICAL HISTORY:  Past Medical History:  Diagnosis Date  . Allergy   . CHF (congestive heart  failure) ([place])   . Chronic knee pain   . Coronary artery disease    with LAD DE stent 2004  . Heel spur, left   . Hyperlipidemia   . Hypertension   . Kidney stones   . Lower back pain    status post surgery for spondylolisthesis  . Migraine   . Persistent atrial fibrillation ([place])    longstanding persistent (since 05/2008)  . Plantar fasciitis     SURGICAL HISTORY: Past Surgical History:  Procedure Laterality Date  . BACK SURGERY     spondylolisthesis  . CARDIAC CATHETERIZATION  03/06/2003  . CORONARY ANGIOPLASTY WITH STENT PLACEMENT  04/2003   CYPHER stent implantation in the LAD  . ESOPHAGOGASTRODUODENOSCOPY (EGD) WITH PROPOFOL N/A 01/07/2018   Procedure: ESOPHAGOGASTRODUODENOSCOPY (EGD) WITH PROPOFOL;  Surgeon: Wonda Horner, MD;  Location: [facility] ENDOSCOPY;  Service: Endoscopy;  Laterality: N/A;  . HAND SURGERY Right 2014   carpal tunnel  . SKIN CANCER EXCISION  1980's   Face  . VASCULAR SURGERY     Vascular Stent    I have reviewed the social history and family history with the patient and they are unchanged from previous note.  ALLERGIES:  is allergic to celecoxib; lisinopril; and simvastatin.  MEDICATIONS:  Current Outpatient Medications  Medication Sig Dispense Refill  . cetirizine (ZYRTEC) 10 MG tablet Take 10 mg by mouth daily.    . diphenhydramine-acetaminophen (TYLENOL PM) 25-500 MG TABS Take 1 tablet by mouth at bedtime as needed (for sleep).     [name] eplerenone (INSPRA) 25 MG tablet Take 1 tablet (25 mg total) by mouth daily. Please keep 5/31 appointment for additional refills thanks. 30 tablet 1  . fluticasone (FLONASE) 50 MCG/ACT nasal  spray USE 2 SPRAYS IN EACH NOSTRIL DAILY 48 g 3  . gabapentin (NEURONTIN) 300 MG capsule Take 300 mg by mouth every other day.     [name] HYDROcodone-acetaminophen (NORCO) 5-325 MG tablet Take 1 tablet by mouth every 4 (four) hours as needed for moderate pain. 30 tablet 0  . metoprolol succinate (TOPROL-XL) 25 MG 24 hr tablet Take 1  tablet (25 mg total) by mouth daily. Please keep 5/31 appointment for additional refills thanks. 30 tablet 1  . pantoprazole (PROTONIX) 40 MG tablet Take 1 tablet (40 mg total) by mouth daily. 30 tablet 1  . prochlorperazine (COMPAZINE) 5 MG tablet Take 1 tablet (5 mg total) by mouth every 6 (six) hours as needed for nausea or vomiting. 30 tablet 0  . psyllium (METAMUCIL) 58.6 % powder Take 1 packet by mouth daily.     . tamsulosin (FLOMAX) 0.4 MG CAPS capsule Take 0.4 mg by mouth daily after supper.    . vitamin C (ASCORBIC ACID) 500 MG tablet Take 500 mg by mouth daily.    [name] albuterol (PROAIR HFA) 108 (90 Base) MCG/ACT inhaler Inhale 1-2 puffs into the lungs every 6 (six) hours as needed for wheezing or shortness of breath. (Patient not taking: Reported on 03/01/2018) 1 Inhaler prn  . dexamethasone (DECADRON) 2 MG tablet Take 5 tablets (10mg ) PO at 10 PM the night before first chemo. Take 5 tablets (10mg ) 6 AM the morning of first chemo. (Patient not taking: Reported on 02/17/2018) 10 tablet 0  . nitroGLYCERIN (NITROSTAT) 0.4 MG SL tablet Place 0.4 mg under the tongue every 5 (five) minutes as needed.    . ondansetron (ZOFRAN ODT) 8 MG disintegrating tablet Take 1 tablet (8 mg total) by mouth every 8 (eight) hours as needed for nausea or vomiting. 30 tablet 0  . sucralfate (CARAFATE) 1 GM/10ML suspension Take 10 mLs (1 g total) by mouth 4 (four) times daily -  with meals and at bedtime. 420 mL 0  . warfarin (COUMADIN) 2.5 MG tablet Take 2.5 mg by mouth daily.    [name] warfarin (COUMADIN) 5 MG tablet TAKE ONE TABLET DAILY AS DIRECTED (Patient not taking: Reported on 03/01/2018) 90 tablet 0   No current facility-administered medications for this visit.    Facility-Administered Medications Ordered in Other Visits  Medication Dose Route Frequency Provider Last Rate Last Dose  . 0.9 %  sodium chloride infusion  1,000 mL Intravenous Once Alla Feeling, NP        PHYSICAL EXAMINATION: ECOG PERFORMANCE  STATUS: 2 - Symptomatic, <50% confined to bed  Vitals:   03/01/18 0949  BP: 117/81  Pulse: 89  Resp: 18  Temp: 97.7 F (36.5 C)  SpO2: 99%   Filed Weights   03/01/18 0949  Weight: 179 lb 1.6 oz (81.2 kg)    GENERAL:alert, no distress and comfortable SKIN: no rashes, significant lesions, or hyperpigmentation EYES: normal, Conjunctiva are pink and non-injected, sclera clear OROPHARYNX: no thrush, ulcers, gingival or mandibular abnormalities. Pain was not reproducible.   LYMPH:  no palpable cervical or supraclavicular lymphadenopathy LUNGS: clear to auscultation with normal breathing effort HEART: regular rate & rhythm and no murmurs and no lower extremity edema ABDOMEN:abdomen soft, non-tender and normal bowel sounds Musculoskeletal:no cyanosis of digits and no clubbing  NEURO: alert & oriented x 3 with fluent speech, no focal motor/sensory deficits  LABORATORY DATA:  I have reviewed the data as listed CBC Latest Ref Rng & Units 02/24/2018 02/17/2018 02/10/2018  WBC 4.0 -  10.3 K/uL 6.1 3.6(L) 5.8  Hemoglobin 13.0 - 17.1 g/dL 13.7 13.6 13.2  Hematocrit 38.4 - 49.9 % 41.6 41.5 40.2  Platelets 140 - 400 K/uL 200 216 264     CMP Latest Ref Rng & Units 02/17/2018 02/10/2018 02/03/2018  Glucose 70 - 140 mg/dL 90 89 152(H)  BUN 7 - 26 mg/dL 14 18 12   Creatinine 0.70 - 1.30 mg/dL 0.76 0.76 0.78  Sodium 136 - 145 mmol/L 141 139 143  Potassium 3.5 - 5.1 mmol/L 4.3 4.5 4.9  Chloride 98 - 109 mmol/L 106 106 108  CO2 22 - 29 mmol/L 27 25 25   Calcium 8.4 - 10.4 mg/dL 9.7 9.4 9.5  Total Protein 6.4 - 8.3 g/dL 6.6 6.4 6.7  Total Bilirubin 0.2 - 1.2 mg/dL 0.5 0.5 0.4  Alkaline Phos 40 - 150 U/L 51 54 55  AST 5 - 34 U/L 17 19 18   ALT 0 - 55 U/L 12 13 16       RADIOGRAPHIC STUDIES: I have personally reviewed the radiological images as listed and agreed with the findings in the report. No results found.   ASSESSMENT & PLAN:  1. Adenocarcinoma of the GE junction ? Partially obstructing  mass noted at the GE junction on endoscopy 01/07/2018, nonobstructing ? CTs 01/14/2018, sub-solid 5 mm right upper lobe nodule, mild eccentric wall thickening at the GE junction ? PET scan 01/27/2018-hypermetabolism at the GE junction, no evidence of metastatic disease ? Initiation of radiation and weekly Taxol/carboplatin 02/03/2018 ? Week 2 Taxol/carboplatin 02/10/2018 ? Week 3 Taxol/carboplatin 02/17/2018 ? Week 4 Taxol/carboplatin 02/24/2018 2. GI bleeding secondary to #1 3. Atrial fibrillation-maintained on Coumadin prior to hospital admission 01/07/2018 4. Coronary artery disease 5. Chronic exertional dyspnea-likely COPD 6. History of tobacco use 7. History of kidney stones 8. Focus of hypermetabolism at the right prostate on the PET scan 01/27/2018, PSA normal on 02/10/2018  Disposition:  Larry Bautista appears stable. He completed 4 weeks of taxol/carboplatin chemotherapy concurrent with radiation. He had significant pain with swallowing requiring IVF support that is now improved, main concern currently is reflux. He takes protonix once daily, I recommend he increase to 1 tab BID. Presently he takes 1 norco daily, I reviewed he can escalate to more frequent dosing. He thinks Carafate aggravates his pain, he does not use. I explained his reflux may continue to worsen while on radiation, and we will monitor him closely. Will support with IVF today. He will return 03/03/18 for week 5 taxol/carboplatin. He will return next week for f/u and his 6th treatment. The plan was reviewed with Dr. Benay Spice.  All questions were answered. The patient knows to call the clinic with any problems, questions or concerns. No barriers to learning was detected.     Alla Feeling, NP 03/01/18

## 2018-03-01 ENCOUNTER — Encounter: Payer: Self-pay | Admitting: Nurse Practitioner

## 2018-03-01 ENCOUNTER — Inpatient Hospital Stay (HOSPITAL_BASED_OUTPATIENT_CLINIC_OR_DEPARTMENT_OTHER): Payer: Medicare Other | Admitting: Nurse Practitioner

## 2018-03-01 ENCOUNTER — Telehealth: Payer: Self-pay | Admitting: Oncology

## 2018-03-01 ENCOUNTER — Inpatient Hospital Stay: Payer: Medicare Other

## 2018-03-01 ENCOUNTER — Ambulatory Visit
Admission: RE | Admit: 2018-03-01 | Discharge: 2018-03-01 | Disposition: A | Discharge status: Home / Self Care | Payer: Medicare Other | Source: Ambulatory Visit | Attending: Radiation Oncology | Admitting: Radiation Oncology

## 2018-03-01 ENCOUNTER — Other Ambulatory Visit: Payer: Self-pay | Admitting: *Deleted

## 2018-03-01 ENCOUNTER — Ambulatory Visit: Payer: Medicare Other | Admitting: Nutrition

## 2018-03-01 VITALS — BP 117/81 | HR 89 | Temp 97.7°F | Resp 18 | Ht 68.0 in | Wt 179.1 lb

## 2018-03-01 DIAGNOSIS — C160 Malignant neoplasm of cardia: Secondary | ICD-10-CM | POA: Diagnosis not present

## 2018-03-01 DIAGNOSIS — Z5111 Encounter for antineoplastic chemotherapy: Secondary | ICD-10-CM | POA: Diagnosis not present

## 2018-03-01 DIAGNOSIS — E860 Dehydration: Secondary | ICD-10-CM | POA: Diagnosis not present

## 2018-03-01 DIAGNOSIS — G893 Neoplasm related pain (acute) (chronic): Secondary | ICD-10-CM | POA: Diagnosis not present

## 2018-03-01 DIAGNOSIS — R972 Elevated prostate specific antigen [PSA]: Secondary | ICD-10-CM | POA: Diagnosis not present

## 2018-03-01 DIAGNOSIS — Z510 Encounter for antineoplastic radiation therapy: Secondary | ICD-10-CM | POA: Diagnosis not present

## 2018-03-01 DIAGNOSIS — K210 Gastro-esophageal reflux disease with esophagitis: Secondary | ICD-10-CM | POA: Diagnosis not present

## 2018-03-01 MED ORDER — SODIUM CHLORIDE 0.9 % IV SOLN
Freq: Once | INTRAVENOUS | Status: AC
  Administered 2018-03-01: 12:00:00 via INTRAVENOUS

## 2018-03-01 MED ORDER — SODIUM CHLORIDE 0.9 % IV SOLN: 1000.0000 mL | Freq: Once | INTRAVENOUS | Status: DC

## 2018-03-01 NOTE — Telephone Encounter (Signed)
Unable to schedule appointment- CAP Day for infusion on 7/3/ Added to infusion log and will call patient once it has been scheduled per 6/24 los

## 2018-03-01 NOTE — Progress Notes (Signed)
Nutrition follow-up completed with patient during infusion. Patient reports he does not feel great. States his wife says he does not drink enough water. He has no specific nutrition questions or concerns today.  Nutrition diagnosis: Food and nutrition related knowledge deficit continues.  Intervention: Encouraged boost plus at least 3 times daily. Encouraged to increase fluids overall. Educated on strategies to increase calories and protein and encourage soft smooth easy to swallow foods.  Monitoring, evaluation, goals: Patient will tolerate adequate calories and protein to minimize weight loss.  Next visit: To be scheduled as needed.   **Disclaimer: This note was dictated with voice recognition software. Similar sounding words can inadvertently be transcribed and this note may contain transcription errors which may not have been corrected upon publication of note.**

## 2018-03-01 NOTE — Patient Instructions (Signed)
Dehydration, Adult Dehydration is when there is not enough fluid or water in your body. This happens when you lose more fluids than you take in. Dehydration can range from mild to very bad. It should be treated right away to keep it from getting very bad. Symptoms of mild dehydration may include:  Thirst.  Dry lips.  Slightly dry mouth.  Dry, warm skin.  Dizziness. Symptoms of moderate dehydration may include:  Very dry mouth.  Muscle cramps.  Dark pee (urine). Pee may be the color of tea.  Your body making less pee.  Your eyes making fewer tears.  Heartbeat that is uneven or faster than normal (palpitations).  Headache.  Light-headedness, especially when you stand up from sitting.  Fainting (syncope). Symptoms of very bad dehydration may include:  Changes in skin, such as: ? Cold and clammy skin. ? Blotchy (mottled) or pale skin. ? Skin that does not quickly return to normal after being lightly pinched and let go (poor skin turgor).  Changes in body fluids, such as: ? Feeling very thirsty. ? Your eyes making fewer tears. ? Not sweating when body temperature is high, such as in hot weather. ? Your body making very little pee.  Changes in vital signs, such as: ? Weak pulse. ? Pulse that is more than 100 beats a minute when you are sitting still. ? Fast breathing. ? Low blood pressure.  Other changes, such as: ? Sunken eyes. ? Cold hands and feet. ? Confusion. ? Lack of energy (lethargy). ? Trouble waking up from sleep. ? Short-term weight loss. ? Unconsciousness. Follow these instructions at home:  If told by your doctor, drink an ORS: ? Make an ORS by using instructions on the package. ? Start by drinking small amounts, about  cup (120 mL) every 5-10 minutes. ? Slowly drink more until you have had the amount that your doctor said to have.  Drink enough clear fluid to keep your pee clear or pale yellow. If you were told to drink an ORS, finish the ORS  first, then start slowly drinking clear fluids. Drink fluids such as: ? Water. Do not drink only water by itself. Doing that can make the salt (sodium) level in your body get too low (hyponatremia). ? Ice chips. ? Fruit juice that you have added water to (diluted). ? Low-calorie sports drinks.  Avoid: ? Alcohol. ? Drinks that have a lot of sugar. These include high-calorie sports drinks, fruit juice that does not have water added, and soda. ? Caffeine. ? Foods that are greasy or have a lot of fat or sugar.  Take over-the-counter and prescription medicines only as told by your doctor.  Do not take salt tablets. Doing that can make the salt level in your body get too high (hypernatremia).  Eat foods that have minerals (electrolytes). Examples include bananas, oranges, potatoes, tomatoes, and spinach.  Keep all follow-up visits as told by your doctor. This is important. Contact a doctor if:  You have belly (abdominal) pain that: ? Gets worse. ? Stays in one area (localizes).  You have a rash.  You have a stiff neck.  You get angry or annoyed more easily than normal (irritability).  You are more sleepy than normal.  You have a harder time waking up than normal.  You feel: ? Weak. ? Dizzy. ? Very thirsty.  You have peed (urinated) only a small amount of very dark pee during 6-8 hours. Get help right away if:  You have symptoms of   very bad dehydration.  You cannot drink fluids without throwing up (vomiting).  Your symptoms get worse with treatment.  You have a fever.  You have a very bad headache.  You are throwing up or having watery poop (diarrhea) and it: ? Gets worse. ? Does not go away.  You have blood or something green (bile) in your throw-up.  You have blood in your poop (stool). This may cause poop to look black and tarry.  You have not peed in 6-8 hours.  You pass out (faint).  Your heart rate when you are sitting still is more than 100 beats a  minute.  You have trouble breathing. This information is not intended to replace advice given to you by your health care provider. Make sure you discuss any questions you have with your health care provider. Document Released: 06/21/2009 Document Revised: 03/14/2016 Document Reviewed: 10/19/2015 Elsevier Interactive Patient Education  2018 [employer]  

## 2018-03-02 ENCOUNTER — Ambulatory Visit
Admission: RE | Admit: 2018-03-02 | Discharge: 2018-03-02 | Disposition: A | Discharge status: Home / Self Care | Payer: Medicare Other | Source: Ambulatory Visit | Attending: Radiation Oncology | Admitting: Radiation Oncology

## 2018-03-02 DIAGNOSIS — Z510 Encounter for antineoplastic radiation therapy: Secondary | ICD-10-CM | POA: Diagnosis not present

## 2018-03-02 DIAGNOSIS — C160 Malignant neoplasm of cardia: Secondary | ICD-10-CM | POA: Diagnosis not present

## 2018-03-03 ENCOUNTER — Ambulatory Visit
Admission: RE | Admit: 2018-03-03 | Discharge: 2018-03-03 | Disposition: A | Discharge status: Home / Self Care | Payer: Medicare Other | Source: Ambulatory Visit | Attending: Radiation Oncology | Admitting: Radiation Oncology

## 2018-03-03 ENCOUNTER — Encounter: Payer: Medicare Other | Admitting: Nutrition

## 2018-03-03 ENCOUNTER — Encounter: Payer: Medicare Other | Admitting: Genetic Counselor

## 2018-03-03 ENCOUNTER — Inpatient Hospital Stay: Payer: Medicare Other

## 2018-03-03 VITALS — BP 93/71 | HR 67 | Temp 97.5°F | Resp 17

## 2018-03-03 DIAGNOSIS — C160 Malignant neoplasm of cardia: Secondary | ICD-10-CM | POA: Diagnosis not present

## 2018-03-03 DIAGNOSIS — G893 Neoplasm related pain (acute) (chronic): Secondary | ICD-10-CM | POA: Diagnosis not present

## 2018-03-03 DIAGNOSIS — Z5111 Encounter for antineoplastic chemotherapy: Secondary | ICD-10-CM | POA: Diagnosis not present

## 2018-03-03 DIAGNOSIS — Z510 Encounter for antineoplastic radiation therapy: Secondary | ICD-10-CM | POA: Diagnosis not present

## 2018-03-03 DIAGNOSIS — K210 Gastro-esophageal reflux disease with esophagitis: Secondary | ICD-10-CM | POA: Diagnosis not present

## 2018-03-03 DIAGNOSIS — R972 Elevated prostate specific antigen [PSA]: Secondary | ICD-10-CM | POA: Diagnosis not present

## 2018-03-03 DIAGNOSIS — E860 Dehydration: Secondary | ICD-10-CM | POA: Diagnosis not present

## 2018-03-03 LAB — CMP (CANCER CENTER ONLY)
ALK PHOS: 44 U/L (ref 38–126)
ALT: 15 U/L (ref 0–44)
AST: 17 U/L (ref 15–41)
Albumin: 3.8 g/dL (ref 3.5–5.0)
Anion gap: 9 (ref 5–15)
BUN: 12 mg/dL (ref 8–23)
CHLORIDE: 108 mmol/L (ref 98–111)
CO2: 26 mmol/L (ref 22–32)
Calcium: 9.1 mg/dL (ref 8.9–10.3)
Creatinine: 0.78 mg/dL (ref 0.61–1.24)
GFR, Est AFR Am: >60 mL/min (ref >60)
GLUCOSE: 107 mg/dL — AB (ref 70–99)
POTASSIUM: 3.7 mmol/L (ref 3.5–5.1)
Sodium: 143 mmol/L (ref 135–145)
TOTAL PROTEIN: 6 g/dL — AB (ref 6.5–8.1)
Total Bilirubin: 0.6 mg/dL (ref 0.3–1.2)

## 2018-03-03 LAB — CBC WITH DIFFERENTIAL (CANCER CENTER ONLY)
BASOS ABS: 0.1 10*3/uL (ref 0.0–0.1)
Basophils Relative: 1 %
Eosinophils Absolute: 0.1 10*3/uL (ref 0.0–0.5)
Eosinophils Relative: 2 %
HEMATOCRIT: 40.3 % (ref 38.4–49.9)
Hemoglobin: 13.1 g/dL (ref 13.0–17.1)
LYMPHS ABS: 0.4 10*3/uL — AB (ref 0.9–3.3)
LYMPHS PCT: 11 %
MCH: 31.4 pg (ref 27.2–33.4)
MCHC: 32.5 g/dL (ref 32.0–36.0)
MCV: 96.6 fL (ref 79.3–98.0)
MONO ABS: 0.3 10*3/uL (ref 0.1–0.9)
Monocytes Relative: 8 %
Neutro Abs: 2.9 10*3/uL (ref 1.5–6.5)
Neutrophils Relative %: 78 %
Platelet Count: 133 10*3/uL — ABNORMAL LOW (ref 140–400)
RBC: 4.17 MIL/uL — ABNORMAL LOW (ref 4.20–5.82)
RDW: 13.7 % (ref 11.0–14.6)
WBC Count: 3.7 10*3/uL — ABNORMAL LOW (ref 4.0–10.3)

## 2018-03-03 MED ORDER — DIPHENHYDRAMINE HCL 50 MG/ML IJ SOLN
25.0000 mg | Freq: Once | INTRAMUSCULAR | Status: AC
  Administered 2018-03-03: 25 mg via INTRAVENOUS

## 2018-03-03 MED ORDER — FAMOTIDINE IN NACL 20-0.9 MG/50ML-% IV SOLN
INTRAVENOUS | Status: AC
  Filled 2018-03-03: qty 50

## 2018-03-03 MED ORDER — PALONOSETRON HCL INJECTION 0.25 MG/5ML
INTRAVENOUS | Status: AC
  Filled 2018-03-03: qty 5

## 2018-03-03 MED ORDER — SODIUM CHLORIDE 0.9 % IV SOLN
50.0000 mg/m2 | Freq: Once | INTRAVENOUS | Status: AC
  Administered 2018-03-03: 102 mg via INTRAVENOUS
  Filled 2018-03-03: qty 17

## 2018-03-03 MED ORDER — SODIUM CHLORIDE 0.9 % IV SOLN
200.8000 mg | Freq: Once | INTRAVENOUS | Status: AC
  Administered 2018-03-03: 200 mg via INTRAVENOUS
  Filled 2018-03-03: qty 20

## 2018-03-03 MED ORDER — DEXAMETHASONE SODIUM PHOSPHATE 10 MG/ML IJ SOLN
10.0000 mg | Freq: Once | INTRAMUSCULAR | Status: AC
  Administered 2018-03-03: 10 mg via INTRAVENOUS

## 2018-03-03 MED ORDER — PALONOSETRON HCL INJECTION 0.25 MG/5ML
0.2500 mg | Freq: Once | INTRAVENOUS | Status: AC
  Administered 2018-03-03: 0.25 mg via INTRAVENOUS

## 2018-03-03 MED ORDER — FAMOTIDINE IN NACL 20-0.9 MG/50ML-% IV SOLN
20.0000 mg | Freq: Once | INTRAVENOUS | Status: AC
  Administered 2018-03-03: 20 mg via INTRAVENOUS

## 2018-03-03 MED ORDER — DIPHENHYDRAMINE HCL 50 MG/ML IJ SOLN
INTRAMUSCULAR | Status: AC
  Filled 2018-03-03: qty 1

## 2018-03-03 MED ORDER — DEXAMETHASONE SODIUM PHOSPHATE 10 MG/ML IJ SOLN
INTRAMUSCULAR | Status: AC
  Filled 2018-03-03: qty 1

## 2018-03-03 MED ORDER — SODIUM CHLORIDE 0.9 % IV SOLN
Freq: Once | INTRAVENOUS | Status: AC
  Administered 2018-03-03: 12:00:00 via INTRAVENOUS

## 2018-03-03 NOTE — Patient Instructions (Signed)
[facility] Discharge Instructions for Patients Receiving Chemotherapy  Today you received the following chemotherapy agents carboplatin, taxol  To help prevent nausea and vomiting after your treatment, we encourage you to take your nausea medication as directed  If you develop nausea and vomiting that is not controlled by your nausea medication, call the clinic.   BELOW ARE SYMPTOMS THAT SHOULD BE REPORTED IMMEDIATELY:  *FEVER GREATER THAN 100.5 F  *CHILLS WITH OR WITHOUT FEVER  NAUSEA AND VOMITING THAT IS NOT CONTROLLED WITH YOUR NAUSEA MEDICATION  *UNUSUAL SHORTNESS OF BREATH  *UNUSUAL BRUISING OR BLEEDING  TENDERNESS IN MOUTH AND THROAT WITH OR WITHOUT PRESENCE OF ULCERS  *URINARY PROBLEMS  *BOWEL PROBLEMS  UNUSUAL RASH Items with * indicate a potential emergency and should be followed up as soon as possible.  Feel free to call the clinic should you have any questions or concerns. The clinic phone number is [phone].  Please show the [facility] at check-in to the Emergency Department and triage nurse.

## 2018-03-04 ENCOUNTER — Ambulatory Visit: Payer: Medicare Other | Admitting: Cardiothoracic Surgery

## 2018-03-04 ENCOUNTER — Ambulatory Visit: Payer: Medicare Other

## 2018-03-04 ENCOUNTER — Ambulatory Visit
Admission: RE | Admit: 2018-03-04 | Discharge: 2018-03-04 | Disposition: A | Discharge status: Home / Self Care | Payer: Medicare Other | Source: Ambulatory Visit | Attending: Radiation Oncology | Admitting: Radiation Oncology

## 2018-03-04 DIAGNOSIS — Z510 Encounter for antineoplastic radiation therapy: Secondary | ICD-10-CM | POA: Diagnosis not present

## 2018-03-04 DIAGNOSIS — C160 Malignant neoplasm of cardia: Secondary | ICD-10-CM | POA: Diagnosis not present

## 2018-03-05 ENCOUNTER — Ambulatory Visit
Admission: RE | Admit: 2018-03-05 | Discharge: 2018-03-05 | Disposition: A | Discharge status: Home / Self Care | Payer: Medicare Other | Source: Ambulatory Visit | Attending: Radiation Oncology | Admitting: Radiation Oncology

## 2018-03-05 ENCOUNTER — Ambulatory Visit: Payer: Medicare Other | Admitting: Cardiothoracic Surgery

## 2018-03-05 DIAGNOSIS — C160 Malignant neoplasm of cardia: Secondary | ICD-10-CM | POA: Diagnosis not present

## 2018-03-05 DIAGNOSIS — Z510 Encounter for antineoplastic radiation therapy: Secondary | ICD-10-CM | POA: Diagnosis not present

## 2018-03-05 NOTE — Progress Notes (Deleted)
[address]       [place] [address]             [id number]                    Khari D Polasek [facility] Medical Record #[id number] Date of Birth: 01-24-1941  Referring: Ladell Pier, MD Primary Care: [name], MD Primary Cardiologist: No primary care provider on file.  Chief Complaint:    No chief complaint on file.   History of Present Illness:    YUE GLASHEEN 77 y.o. male is seen in the office  today for a new diagnosis of distal esophageal adenocarcinoma.  The patient has had no previous history of esophageal problems or GI bleed.  He noted several months of increasing weakness and fatigue.  He denied any weight loss.  He noted increasing episodes of wheezing along with the fatigue and thought his underlying COPD was worse.  3 days prior to admission he developed black stools.  He then on the day of admission became very weak nauseated and vomited vomited bright red blood.   The patient had been on Coumadin for 4 to 5 years for chronic atrial fibrillation after admission his Coumadin was reversed endoscopy showed a distal esophageal mass pathology showed adenocarcinoma CT scan of the chest and abdomen was performed.  The patient was given 2 units of packed red blood cells.  He was then discharged from the hospital on aspirin and Coumadin and told to go and make an appointment at the cancer center and in the thoracic surgery office.   When he was seen in the oncology office by Dr. Benay Spice his Coumadin and aspirin was stopped.  Formal staging is not complete, PET scan is pending next week, EUS has not been done    Current Activity/ Functional Status:  Patient is independent with mobility/ambulation, transfers, ADL's, IADL's.   Zubrod Score: At the time of surgery this patient's most appropriate activity status/level should be described as: []     0    Normal activity, no symptoms [x]     1    Restricted in physical strenuous activity  but ambulatory, able to do out light work []     2    Ambulatory and capable of self care, unable to do work activities, up and about               >50 % of waking hours                              []     3    Only limited self care, in bed greater than 50% of waking hours []     4    Completely disabled, no self care, confined to bed or chair []     5    Moribund   Past Medical History:  Diagnosis Date  . Allergy   . CHF (congestive heart failure) ([place])   . Chronic knee pain   . Coronary artery disease    with LAD DE stent 2004  . Heel spur, left   . Hyperlipidemia   . Hypertension   . Kidney stones   . Lower back pain    status post surgery for spondylolisthesis  . Migraine   . Persistent atrial fibrillation ([place])    longstanding persistent (since 05/2008)  . Plantar fasciitis     Past  Surgical History:  Procedure Laterality Date  . BACK SURGERY     spondylolisthesis  . CARDIAC CATHETERIZATION  03/06/2003  . CORONARY ANGIOPLASTY WITH STENT PLACEMENT  04/2003   CYPHER stent implantation in the LAD  . ESOPHAGOGASTRODUODENOSCOPY (EGD) WITH PROPOFOL N/A 01/07/2018   Procedure: ESOPHAGOGASTRODUODENOSCOPY (EGD) WITH PROPOFOL;  Surgeon: Wonda Horner, MD;  Location: [facility] ENDOSCOPY;  Service: Endoscopy;  Laterality: N/A;  . HAND SURGERY Right 2014   carpal tunnel  . SKIN CANCER EXCISION  1980's   Face  . VASCULAR SURGERY     Vascular Stent    Family History  Problem Relation Age of Onset  . Heart attack Father   . Colon cancer Father   . Congestive Heart Failure Father   . Atrial fibrillation Brother   . Stroke Brother   . Bladder Cancer Brother   . Lung cancer Mother   . Atrial fibrillation Brother   . COPD Brother   . Congestive Heart Failure Brother   . Lung cancer Brother   . Migraines Daughter    Family history significant for both his brother and mother who died of lung cancer, father died of colon cancer one sister had a neurological degenerative disease he has one  daughter and one stepdaughter  Social History   Tobacco Use  Smoking Status Former Research scientist (life sciences)  . Packs/day: 2.00  . Years: 35.00  . Pack years: 70.00  . Types: Cigarettes  . Start date: 09/08/1954  . Last attempt to quit: 11/06/1989  . Years since quitting: 28.3  Smokeless Tobacco Never Used  Tobacco Comment   Smoked from [phone] (35 years)    Social History   Substance and Sexual Activity  Alcohol Use Not Currently  . Alcohol/week: 1.2 oz  . Types: 2 Standard drinks or equivalent per week   Comment: 1961-2009 (Last drink 2009)      Allergies  Allergen Reactions  . Celecoxib Rash  . Lisinopril Cough  . Simvastatin Other (See Comments)    Leg pain    Current Outpatient Medications  Medication Sig Dispense Refill  . albuterol (PROAIR HFA) 108 (90 Base) MCG/ACT inhaler Inhale 1-2 puffs into the lungs every 6 (six) hours as needed for wheezing or shortness of breath. (Patient not taking: Reported on 03/01/2018) 1 Inhaler prn  . cetirizine (ZYRTEC) 10 MG tablet Take 10 mg by mouth daily.    [name] dexamethasone (DECADRON) 2 MG tablet Take 5 tablets (10mg ) PO at 10 PM the night before first chemo. Take 5 tablets (10mg ) 6 AM the morning of first chemo. (Patient not taking: Reported on 02/17/2018) 10 tablet 0  . diphenhydramine-acetaminophen (TYLENOL PM) 25-500 MG TABS Take 1 tablet by mouth at bedtime as needed (for sleep).     [name] eplerenone (INSPRA) 25 MG tablet Take 1 tablet (25 mg total) by mouth daily. Please keep 5/31 appointment for additional refills thanks. 30 tablet 1  . fluticasone (FLONASE) 50 MCG/ACT nasal spray USE 2 SPRAYS IN EACH NOSTRIL DAILY 48 g 3  . gabapentin (NEURONTIN) 300 MG capsule Take 300 mg by mouth every other day.     [name] HYDROcodone-acetaminophen (NORCO) 5-325 MG tablet Take 1 tablet by mouth every 4 (four) hours as needed for moderate pain. 30 tablet 0  . metoprolol succinate (TOPROL-XL) 25 MG 24 hr tablet Take 1 tablet (25 mg total) by mouth daily. Please keep  5/31 appointment for additional refills thanks. 30 tablet 1  . nitroGLYCERIN (NITROSTAT) 0.4 MG SL tablet Place 0.4 mg under  the tongue every 5 (five) minutes as needed.    . ondansetron (ZOFRAN ODT) 8 MG disintegrating tablet Take 1 tablet (8 mg total) by mouth every 8 (eight) hours as needed for nausea or vomiting. 30 tablet 0  . pantoprazole (PROTONIX) 40 MG tablet Take 1 tablet (40 mg total) by mouth daily. 30 tablet 1  . prochlorperazine (COMPAZINE) 5 MG tablet Take 1 tablet (5 mg total) by mouth every 6 (six) hours as needed for nausea or vomiting. 30 tablet 0  . psyllium (METAMUCIL) 58.6 % powder Take 1 packet by mouth daily.     . sucralfate (CARAFATE) 1 GM/10ML suspension Take 10 mLs (1 g total) by mouth 4 (four) times daily -  with meals and at bedtime. 420 mL 0  . tamsulosin (FLOMAX) 0.4 MG CAPS capsule Take 0.4 mg by mouth daily after supper.    . vitamin C (ASCORBIC ACID) 500 MG tablet Take 500 mg by mouth daily.    [name] warfarin (COUMADIN) 2.5 MG tablet Take 2.5 mg by mouth daily.    [name] warfarin (COUMADIN) 5 MG tablet TAKE ONE TABLET DAILY AS DIRECTED (Patient not taking: Reported on 03/01/2018) 90 tablet 0   No current facility-administered medications for this visit.     Pertinent items are noted in HPI.   Review of Systems:     Cardiac Review of Systems: [Y] = yes  or   [ N ] = no   Chest Pain [  n  ]  Resting SOB [ y  ] Exertional SOB  Blue.Reese  ]  Orthopnea Florencio.Farrier  ]   Pedal Edema [ y  ]    Palpitations [n] Syncope  [n  ]   Presyncope [ y  ]   General Review of Systems: [Y] = yes [  ]=no Constitional: recent weight change [  ];  Wt loss over the last 3 months [   ] anorexia [  ]; fatigue Blue.Reese  ]; nausea [  ]; night sweats [  ]; fever [  ]; or chills [  ];           Eye : blurred vision [  ]; diplopia [   ]; vision changes [  ];  Amaurosis fugax[  ]; Resp: cough [  ];  wheezing[  ];  hemoptysis[  ]; shortness of breath[  ]; paroxysmal nocturnal dyspnea[  ]; dyspnea on exertion[  ]; or  orthopnea[  ];  GI:  gallstones[  ], vomiting[y  ];  dysphagia[  ]; melena[ y ];  hematochezia Blue.Reese  ]; heartburn[  ];   Hx of  Colonoscopy[  ]; GU: kidney stones [  ]; hematuria[  ];   dysuria [  ];  nocturia[  ];  history of     obstruction [  ]; urinary frequency [ y ]             Skin: rash, swelling[  ];, hair loss[  ];  peripheral edema[  ];  or itching[  ]; Musculosketetal: myalgias[  ];  joint swelling[  ];  joint erythema[  ];  joint pain[ y ];  back pain[ y ];  Heme/Lymph: bruising[ y ];  bleeding[  ];  anemia[  ];  Neuro: TIA[  ];  headaches[  ];  stroke[  ];  vertigo[  ];  seizures[  ];   paresthesias[  ];  difficulty walking[  ];  Psych:depression[  ]; anxiety[  ];  Endocrine: diabetes[  ];  thyroid dysfunction[  ];  Immunizations: Flu up to date Blue.Reese  ]; Pneumococcal up to date [ y ];  Other:    PHYSICAL EXAMINATION: There were no vitals taken for this visit. General appearance: alert, cooperative and no distress Head: Normocephalic, without obvious abnormality, atraumatic Neck: no adenopathy, no carotid bruit, no JVD, supple, symmetrical, trachea midline and thyroid not enlarged, symmetric, no tenderness/mass/nodules Lymph nodes: Cervical, supraclavicular, and axillary nodes normal. Resp: clear to auscultation bilaterally Back: symmetric, no curvature. ROM normal. No CVA tenderness. Cardio: irregularly irregular rhythm GI: soft, non-tender; bowel sounds normal; no masses,  no organomegaly Extremities: extremities normal, atraumatic, no cyanosis or edema and Homans sign is negative, no sign of DVT Neurologic: Grossly normal   Wt Readings from Last 3 Encounters:  03/01/18 179 lb 1.6 oz (81.2 kg)  02/24/18 179 lb 11.2 oz (81.5 kg)  02/17/18 180 lb 14.4 oz (82.1 kg)   Diagnostic Studies & Laboratory data:     Recent Radiology Findings:  EXAM: NUCLEAR MEDICINE PET SKULL BASE TO THIGH  TECHNIQUE: 9.5 mCi F-18 FDG was injected intravenously. Full-ring PET imaging was  performed from the skull base to thigh after the radiotracer. CT data was obtained and used for attenuation correction and anatomic localization.  Fasting blood glucose: 96 mg/dl  COMPARISON:  Multiple exams, including CT 01/14/2018  FINDINGS: Mediastinal blood pool activity: SUV max 2.7  NECK: No significant abnormal hypermetabolic activity in this region.  Incidental CT findings: Chronic bilateral maxillary sinusitis.  CHEST: No significant abnormal hypermetabolic activity in this region.  Incidental CT findings: Centrilobular emphysema. Mild scarring anteriorly in the right upper lobe. Mild atelectasis in the left lower lobe along the diaphragm. Coronary, aortic arch, and branch vessel atherosclerotic vascular disease. Mild cardiomegaly.  ABDOMEN/PELVIS: Subtle accentuated activity at the gastroesophageal junction/proximal stomach, maximum SUV 5.2. Further distally in the stomach body a representative maximum SUV is 3.4. No appreciable hypermetabolic activity in the liver. No hypermetabolic adenopathy in the abdomen/pelvis. Photopenic cyst of the left kidney upper pole.  Accentuated activity in the sigmoid colon is likely physiologic.  Focally accentuated activity along the right posterior prostate gland in the vicinity of the apex is not in the expected location of the prostatic urethra, and has a maximum SUV of 6.8. Prostate cancer is a distinct possibility.  Incidental CT findings: Small hypodense lesions in the left hepatic lobe are stable from 2016 and highly likely to be benign.  Nonobstructive right nephrolithiasis. Photopenic cyst of the left kidney lower pole and of the left kidney upper pole.  Aortoiliac atherosclerotic vascular disease.  SKELETON: No significant abnormal hypermetabolic activity in this region.  Incidental CT findings: Notable right subcoracoid bursitis and severe arthropathy of the right glenohumeral joint.  Degenerative bilateral hip arthropathy.  IMPRESSION: 1. Low-grade accentuated activity in the proximal stomach adjacent to the gastroesophageal junction, maximum SUV 5.2. No findings of metastatic disease to the liver or regional lymph nodes. 2. There is a small focus of hypermetabolic activity in the right posterior apical peripheral zone of the prostate gland, maximum SUV 6.8-prostate cancer versus active inflammatory lesion. No local adenopathy observed. 3. Other imaging findings of potential clinical significance: Aortic Atherosclerosis (ICD10-I70.0) and Emphysema (ICD10-J43.9). Coronary atherosclerosis. Chronic bilateral maxillary sinusitis. Nonobstructive right nephrolithiasis. Notable right subcoracoid bursitis and prominent arthropathy of the right glenohumeral joint. Degenerative bilateral hip arthropathy.   Electronically Signed   By: Van Clines M.D.   On: 01/27/2018 14:50   Ct Chest W Contrast  Result Date: 01/15/2018  CLINICAL DATA:  New diagnosis of nonobstructing gastroesophageal junction adenocarcinoma on 01/07/2018 upper endoscopy. EXAM: CT CHEST, ABDOMEN, AND PELVIS WITH CONTRAST TECHNIQUE: Multidetector CT imaging of the chest, abdomen and pelvis was performed following the standard protocol during bolus administration of intravenous contrast. CONTRAST:  185mL OMNIPAQUE IOHEXOL 300 MG/ML  SOLN COMPARISON:  12/08/2017 chest radiograph. 07/11/2015 CT abdomen/pelvis. FINDINGS: CT CHEST FINDINGS Cardiovascular: Normal heart size. No significant pericardial effusion/thickening. Left main and 3 vessel coronary atherosclerosis. Atherosclerotic nonaneurysmal thoracic aorta. Normal caliber pulmonary arteries. No central pulmonary emboli. Mediastinum/Nodes: No discrete thyroid nodules. There is mild eccentric wall thickening posteriorly at the esophagogastric junction (series 2/image 52), without a discrete measurable mass by CT. No pathologically enlarged axillary,  mediastinal or hilar lymph nodes. Lungs/Pleura: No pneumothorax. No pleural effusion. Moderate centrilobular emphysema with diffuse bronchial wall thickening. Sub solid right upper lobe 5 mm pulmonary nodule (series 4/image 36). No acute consolidative airspace disease, lung masses or additional significant pulmonary nodules. A few scattered small parenchymal bands at the lung bases, compatible with mild postinfectious/postinflammatory scarring. Musculoskeletal: No aggressive appearing focal osseous lesions. Right shoulder joint effusion/bursal collection anterior to the right scapula (series 2/image 6). Asymmetric advanced right shoulder osteoarthritis. Marked thoracic spondylosis. CT ABDOMEN PELVIS FINDINGS Hepatobiliary: Normal liver size. Subcentimeter hypodense left liver lobe lesions are too small to characterize and are stable since 07/11/2015 CT, considered benign. No new liver lesions. Normal gallbladder with no radiopaque cholelithiasis. No biliary ductal dilatation. Pancreas: Normal, with no mass or duct dilation. Spleen: Normal size. No mass. Adrenals/Urinary Tract: Normal adrenals. No hydronephrosis. Nonobstructing 5 mm lower right renal stone. Small simple renal cysts in both kidneys, largest 2.9 cm in the lateral upper left kidney. Additional subcentimeter hypodense renal cortical lesions in both kidneys are too small to characterize and require no follow-up. Normal bladder. Stomach/Bowel: Mild eccentric wall thickening posteriorly at the esophagogastric junction (series 2/image 52), without a discrete measurable mass by CT. Otherwise normal stomach. Normal caliber small bowel with no small bowel wall thickening. Normal appendix. Normal large bowel with no diverticulosis, large bowel wall thickening or pericolonic fat stranding. Vascular/Lymphatic: Atherosclerotic nonaneurysmal abdominal aorta. Patent portal, splenic, hepatic and renal veins. No pathologically enlarged lymph nodes in the abdomen or  pelvis. Reproductive: Mildly enlarged prostate with nonspecific internal prostatic calcifications. Other: No pneumoperitoneum, ascites or focal fluid collection. Small fat containing left inguinal hernia, stable. Musculoskeletal: No aggressive appearing focal osseous lesions. Marked lumbar spondylosis. Postsurgical changes from bilateral posterior spinal fusion at L5-S1. IMPRESSION: 1. Mild eccentric wall thickening posteriorly at the esophagogastric junction, without a discrete measurable mass by CT, compatible with the provided history of esophagogastric junction malignancy. 2. No locoregional adenopathy. No definite distant metastatic disease. 3. Solitary subsolid 5 mm right upper lobe pulmonary nodule. Recommend attention on follow-up chest CT in 3-6 months. 4. Chronic findings include: Aortic Atherosclerosis (ICD10-I70.0) and Emphysema (ICD10-J43.9). Left main and 3 vessel coronary atherosclerosis. Nonobstructing right nephrolithiasis. Mildly enlarged prostate. Stable small fat containing left inguinal hernia. Electronically Signed   By: Ilona Sorrel M.D.   On: 01/15/2018 16:34     I have independently reviewed the above radiology studies  and reviewed the findings with the patient.   Recent Lab Findings: Lab Results  Component Value Date   WBC 3.7 (L) 03/03/2018   HGB 13.1 03/03/2018   HCT 40.3 03/03/2018   PLT 133 (L) 03/03/2018   GLUCOSE 107 (H) 03/03/2018   CHOL 177 12/11/2017   TRIG 112 12/11/2017   HDL 57 12/11/2017  LDLDIRECT 136.6 08/24/2013   LDLCALC 98 12/11/2017   ALT 15 03/03/2018   AST 17 03/03/2018   NA 143 03/03/2018   K 3.7 03/03/2018   CL 108 03/03/2018   CREATININE 0.78 03/03/2018   BUN 12 03/03/2018   CO2 26 03/03/2018   TSH 0.47 11/06/2016   INR 1.1 01/12/2018   HGBA1C 6.0 (H) 12/11/2017   ENDO:/ no EUS done A medium-sized, ulcerating mass was found at the gastroesophageal junction. The mass was nonobstructing and not circumferential. Biopsies were taken with  a cold forceps for histology. Findings: Erythematous mucosa was found in the prepyloric region of the stomach. The in the duodenum was normal.   PATH: Diagnosis Esophagogastric junction, biopsy - ADENOCARCINOMA. - SEE COMMENT. Microscopic Comment Dr. Vicente Males has reviewed the case and concurs with this interpretation. Dr. Penelope Coop was paged on 01/08/2018. Additional studies can be performed upon clinician request. (JBK:ah 01/08/18) Enid Cutter MD Pathologist, Electronic Signature (Case signed 01/08/2018)   Pulmonary function studies done in the pulmonary office 2018 FEV1 1.9 to 70% predicted DLCO 22.4 475% predicted No formal reading with the study     Assessment / Plan:   #1 adenocarcinoma of the GE junction, partially obstructing mass noted on endoscopy 01/07/2018, CT scan of the chest and abdomen shows small 5 mm right upper lobe lung nodule and eccentric wall thickening of the GE junction #2 patient presented with GI bleed secondary to #1 with blood loss anemia #3 history of atrial fibrillation had been on Coumadin prior to admission for GI bleed #4 known coronary artery disease #5 probable COPD-moderate  Grace Isaac MD      [address] [place] [address] Office [phone]   Beeper [phone]  03/05/2018 12:16 PM

## 2018-03-07 ENCOUNTER — Other Ambulatory Visit: Payer: Self-pay | Admitting: Oncology

## 2018-03-08 ENCOUNTER — Ambulatory Visit
Admission: RE | Admit: 2018-03-08 | Discharge: 2018-03-08 | Disposition: A | Discharge status: Home / Self Care | Payer: Medicare Other | Source: Ambulatory Visit | Attending: Radiation Oncology | Admitting: Radiation Oncology

## 2018-03-08 DIAGNOSIS — Z510 Encounter for antineoplastic radiation therapy: Secondary | ICD-10-CM | POA: Diagnosis not present

## 2018-03-08 DIAGNOSIS — C160 Malignant neoplasm of cardia: Secondary | ICD-10-CM | POA: Diagnosis not present

## 2018-03-09 ENCOUNTER — Telehealth: Payer: Self-pay

## 2018-03-09 ENCOUNTER — Ambulatory Visit
Admission: RE | Admit: 2018-03-09 | Discharge: 2018-03-09 | Disposition: A | Discharge status: Home / Self Care | Payer: Medicare Other | Source: Ambulatory Visit | Attending: Radiation Oncology | Admitting: Radiation Oncology

## 2018-03-09 ENCOUNTER — Inpatient Hospital Stay: Payer: Medicare Other

## 2018-03-09 ENCOUNTER — Inpatient Hospital Stay: Payer: Medicare Other | Attending: Oncology | Admitting: Oncology

## 2018-03-09 VITALS — BP 93/79 | HR 68 | Temp 97.7°F | Resp 17 | Ht 68.0 in | Wt 172.9 lb

## 2018-03-09 DIAGNOSIS — R1310 Dysphagia, unspecified: Secondary | ICD-10-CM | POA: Diagnosis not present

## 2018-03-09 DIAGNOSIS — C160 Malignant neoplasm of cardia: Secondary | ICD-10-CM | POA: Insufficient documentation

## 2018-03-09 DIAGNOSIS — R21 Rash and other nonspecific skin eruption: Secondary | ICD-10-CM | POA: Diagnosis not present

## 2018-03-09 DIAGNOSIS — I4891 Unspecified atrial fibrillation: Secondary | ICD-10-CM | POA: Insufficient documentation

## 2018-03-09 DIAGNOSIS — D6959 Other secondary thrombocytopenia: Secondary | ICD-10-CM | POA: Insufficient documentation

## 2018-03-09 DIAGNOSIS — K210 Gastro-esophageal reflux disease with esophagitis: Secondary | ICD-10-CM | POA: Diagnosis not present

## 2018-03-09 DIAGNOSIS — Z87442 Personal history of urinary calculi: Secondary | ICD-10-CM | POA: Diagnosis not present

## 2018-03-09 DIAGNOSIS — Z7901 Long term (current) use of anticoagulants: Secondary | ICD-10-CM | POA: Diagnosis not present

## 2018-03-09 DIAGNOSIS — Z87891 Personal history of nicotine dependence: Secondary | ICD-10-CM | POA: Insufficient documentation

## 2018-03-09 DIAGNOSIS — L509 Urticaria, unspecified: Secondary | ICD-10-CM | POA: Diagnosis not present

## 2018-03-09 DIAGNOSIS — G893 Neoplasm related pain (acute) (chronic): Secondary | ICD-10-CM | POA: Insufficient documentation

## 2018-03-09 DIAGNOSIS — R911 Solitary pulmonary nodule: Secondary | ICD-10-CM | POA: Diagnosis not present

## 2018-03-09 DIAGNOSIS — Z510 Encounter for antineoplastic radiation therapy: Secondary | ICD-10-CM | POA: Diagnosis not present

## 2018-03-09 LAB — CBC WITH DIFFERENTIAL (CANCER CENTER ONLY)
BASOS PCT: 3 %
Basophils Absolute: 0.1 10*3/uL (ref 0.0–0.1)
Eosinophils Absolute: 0 10*3/uL (ref 0.0–0.5)
Eosinophils Relative: 1 %
HCT: 39.4 % (ref 38.4–49.9)
HEMOGLOBIN: 13.2 g/dL (ref 13.0–17.1)
LYMPHS PCT: 17 %
Lymphs Abs: 0.5 10*3/uL — ABNORMAL LOW (ref 0.9–3.3)
MCH: 31.7 pg (ref 27.2–33.4)
MCHC: 33.5 g/dL (ref 32.0–36.0)
MCV: 94.7 fL (ref 79.3–98.0)
Monocytes Absolute: 0.3 10*3/uL (ref 0.1–0.9)
Monocytes Relative: 10 %
NEUTROS ABS: 2.1 10*3/uL (ref 1.5–6.5)
NEUTROS PCT: 69 %
PLATELETS: 94 10*3/uL — AB (ref 140–400)
RBC: 4.16 MIL/uL — ABNORMAL LOW (ref 4.20–5.82)
RDW: 13.8 % (ref 11.0–14.6)
WBC Count: 3.1 10*3/uL — ABNORMAL LOW (ref 4.0–10.3)

## 2018-03-09 NOTE — Telephone Encounter (Signed)
Printed avs and calender of patient upcoming appointment per 7/2 los

## 2018-03-09 NOTE — Progress Notes (Signed)
  [facility] OFFICE PROGRESS NOTE   Diagnosis: Esophagus cancer  INTERVAL HISTORY:   Mr. Prehn returns as scheduled.  He completed another cycle of chemotherapy on 03/03/2018.  He tolerated the chemotherapy well.  Continues daily radiation.  He complains of odynophagia.  The discomfort and reflux are worse with liquids.  He takes hydrocodone as needed for pain.  He reports discomfort in the submandibular area bilaterally.  Objective:  Vital signs in last 24 hours:  Blood pressure 93/79, pulse 68, temperature 97.7 F (36.5 C), temperature source Oral, resp. rate 17, height 5\' 8"  (1.727 m), weight 172 lb 14.4 oz (78.4 kg), SpO2 97 %.    HEENT: No thrush or ulcers neck without mass Resp: Lungs clear bilaterally Cardio: Regular rate and rhythm GI: No hepatomegaly, nontender Vascular: No leg edema  Lab Results:  Lab Results  Component Value Date   WBC 3.1 (L) 03/09/2018   HGB 13.2 03/09/2018   HCT 39.4 03/09/2018   MCV 94.7 03/09/2018   PLT 94 (L) 03/09/2018   NEUTROABS 2.1 03/09/2018    CMP  Lab Results  Component Value Date   NA 143 03/03/2018   K 3.7 03/03/2018   CL 108 03/03/2018   CO2 26 03/03/2018   GLUCOSE 107 (H) 03/03/2018   BUN 12 03/03/2018   CREATININE 0.78 03/03/2018   CALCIUM 9.1 03/03/2018   PROT 6.0 (L) 03/03/2018   ALBUMIN 3.8 03/03/2018   AST 17 03/03/2018   ALT 15 03/03/2018   ALKPHOS 44 03/03/2018   BILITOT 0.6 03/03/2018   GFRNONAA >60 03/03/2018   GFRAA >60 03/03/2018    Lab Results  Component Value Date   CEA1 <1.00 01/19/2018    Medications: I have reviewed the patient's current medications.   Assessment/Plan: 1. Adenocarcinoma of the GE junction ? Partially obstructing mass noted at the GE junction on endoscopy 01/07/2018, nonobstructing ? CTs 01/14/2018, sub-solid 5 mm right upper lobe nodule, mild eccentric wall thickening at the GE junction ? PET scan 01/27/2018-hypermetabolism at the GE junction, no evidence of  metastatic disease ? Initiation of radiation and weekly Taxol/carboplatin 02/03/2018 ? Week 2 Taxol/carboplatin 02/10/2018 ? Week 3 Taxol/carboplatin 02/17/2018 ? Week 4 Taxol/carboplatin 02/24/2018 ? Week 5 Taxol/carboplatin 03/03/2018 2. GI bleeding secondary to #1 3. Atrial fibrillation-maintained on Coumadin prior to hospital admission 01/07/2018 4. Coronary artery disease 5. Chronic exertional dyspnea-likely COPD 6. History of tobacco use 7. History of kidney stones 8. Focus of hypermetabolism at the right prostate on the PET scan 01/27/2018, PSA normal on 02/10/2018 9. Thrombocytopenia secondary to chemotherapy 10. Odynophagia secondary to chemotherapy/radiation   Disposition: Mr. Tamblyn has completed the planned course of chemotherapy.  He will complete radiation on 03/17/2018.  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  He will continue hydrocodone as needed.  The intermittent submandibular pain is likely related to reflux.  He will return for an office visit in approximately 3 weeks.  I encouraged him to increase his intake of nutrition supplements.  Betsy Coder, MD  03/09/2018  10:47 AM

## 2018-03-10 ENCOUNTER — Ambulatory Visit
Admission: RE | Admit: 2018-03-10 | Discharge: 2018-03-10 | Disposition: A | Discharge status: Home / Self Care | Payer: Medicare Other | Source: Ambulatory Visit | Attending: Radiation Oncology | Admitting: Radiation Oncology

## 2018-03-10 ENCOUNTER — Other Ambulatory Visit: Payer: Self-pay | Admitting: Nurse Practitioner

## 2018-03-10 ENCOUNTER — Inpatient Hospital Stay: Payer: Medicare Other

## 2018-03-10 DIAGNOSIS — C160 Malignant neoplasm of cardia: Secondary | ICD-10-CM | POA: Diagnosis not present

## 2018-03-10 DIAGNOSIS — Z510 Encounter for antineoplastic radiation therapy: Secondary | ICD-10-CM | POA: Diagnosis not present

## 2018-03-10 MED ORDER — HYDROCODONE-ACETAMINOPHEN 5-325 MG PO TABS: 1.0000 | ORAL_TABLET | ORAL | 0 refills | Status: DC | PRN

## 2018-03-10 MED ORDER — PROCHLORPERAZINE MALEATE 5 MG PO TABS: 5.0000 mg | ORAL_TABLET | Freq: Four times a day (QID) | ORAL | 0 refills | Status: DC | PRN

## 2018-03-10 NOTE — Addendum Note (Signed)
Addended by: Jethro Bolus A on: 03/10/2018 09:17 AM   Modules accepted: Orders

## 2018-03-12 ENCOUNTER — Ambulatory Visit
Admission: RE | Admit: 2018-03-12 | Discharge: 2018-03-12 | Disposition: A | Discharge status: Home / Self Care | Payer: Medicare Other | Source: Ambulatory Visit | Attending: Radiation Oncology | Admitting: Radiation Oncology

## 2018-03-12 DIAGNOSIS — C160 Malignant neoplasm of cardia: Secondary | ICD-10-CM | POA: Diagnosis not present

## 2018-03-12 DIAGNOSIS — Z510 Encounter for antineoplastic radiation therapy: Secondary | ICD-10-CM | POA: Diagnosis not present

## 2018-03-15 ENCOUNTER — Ambulatory Visit
Admission: RE | Admit: 2018-03-15 | Discharge: 2018-03-15 | Disposition: A | Discharge status: Home / Self Care | Payer: Medicare Other | Source: Ambulatory Visit | Attending: Radiation Oncology | Admitting: Radiation Oncology

## 2018-03-15 DIAGNOSIS — C160 Malignant neoplasm of cardia: Secondary | ICD-10-CM | POA: Diagnosis not present

## 2018-03-15 DIAGNOSIS — Z510 Encounter for antineoplastic radiation therapy: Secondary | ICD-10-CM | POA: Diagnosis not present

## 2018-03-16 ENCOUNTER — Inpatient Hospital Stay (HOSPITAL_BASED_OUTPATIENT_CLINIC_OR_DEPARTMENT_OTHER): Payer: Medicare Other | Admitting: Medical

## 2018-03-16 ENCOUNTER — Ambulatory Visit
Admission: RE | Admit: 2018-03-16 | Discharge: 2018-03-16 | Disposition: A | Discharge status: Home / Self Care | Payer: Medicare Other | Source: Ambulatory Visit | Attending: Radiation Oncology | Admitting: Radiation Oncology

## 2018-03-16 ENCOUNTER — Other Ambulatory Visit: Payer: Self-pay | Admitting: Interventional Cardiology

## 2018-03-16 ENCOUNTER — Other Ambulatory Visit: Payer: Self-pay | Admitting: Oncology

## 2018-03-16 VITALS — BP 131/86 | HR 69 | Temp 98.2°F | Resp 17 | Ht 68.0 in | Wt 174.6 lb

## 2018-03-16 DIAGNOSIS — C160 Malignant neoplasm of cardia: Secondary | ICD-10-CM | POA: Diagnosis not present

## 2018-03-16 DIAGNOSIS — G893 Neoplasm related pain (acute) (chronic): Secondary | ICD-10-CM | POA: Diagnosis not present

## 2018-03-16 DIAGNOSIS — R21 Rash and other nonspecific skin eruption: Secondary | ICD-10-CM | POA: Diagnosis not present

## 2018-03-16 DIAGNOSIS — R1310 Dysphagia, unspecified: Secondary | ICD-10-CM | POA: Diagnosis not present

## 2018-03-16 DIAGNOSIS — K210 Gastro-esophageal reflux disease with esophagitis: Secondary | ICD-10-CM | POA: Diagnosis not present

## 2018-03-16 DIAGNOSIS — L509 Urticaria, unspecified: Secondary | ICD-10-CM | POA: Diagnosis not present

## 2018-03-16 DIAGNOSIS — Z510 Encounter for antineoplastic radiation therapy: Secondary | ICD-10-CM | POA: Diagnosis not present

## 2018-03-16 MED ORDER — PREDNISONE 5 MG PO TABS: ORAL_TABLET | ORAL | 0 refills | Status: DC

## 2018-03-16 MED ORDER — PREDNISONE 50 MG PO TABS
ORAL_TABLET | ORAL | Status: AC
  Filled 2018-03-16: qty 2

## 2018-03-16 MED ORDER — PREDNISONE 20 MG PO TABS
40.0000 mg | ORAL_TABLET | Freq: Once | ORAL | Status: AC
  Administered 2018-03-16: 40 mg via ORAL

## 2018-03-16 MED ORDER — TRIAMCINOLONE ACETONIDE 0.1 % EX LOTN: 1.0000 "application " | TOPICAL_LOTION | Freq: Three times a day (TID) | CUTANEOUS | 2 refills | Status: DC

## 2018-03-16 NOTE — Patient Instructions (Signed)
Rash A rash is a change in the color of the skin. A rash can also change the way your skin feels. There are many different conditions and factors that can cause a rash. Follow these instructions at home: Pay attention to any changes in your symptoms. Follow these instructions to help with your condition: Medicine Take or apply over-the-counter and prescription medicines only as told by your health care provider. These may include:  Corticosteroid cream.  Anti-itch lotions.  Oral antihistamines.  Skin Care  Apply cool compresses to the affected areas.  Try taking a bath with: ? Epsom salts. Follow the instructions on the packaging. You can get these at your local pharmacy or grocery store. ? Baking soda. Pour a small amount into the bath as told by your health care provider. ? Colloidal oatmeal. Follow the instructions on the packaging. You can get this at your local pharmacy or grocery store.  Try applying baking soda paste to your skin. Stir water into baking soda until it reaches a paste-like consistency.  Do not scratch or rub your skin.  Avoid covering the rash. Make sure the rash is exposed to air as much as possible. General instructions  Avoid hot showers or baths, which can make itching worse. A cold shower may help.  Avoid scented soaps, detergents, and perfumes. Use gentle soaps, detergents, perfumes, and other cosmetic products.  Avoid any substance that causes your rash. Keep a journal to help track what causes your rash. Write down: ? What you eat. ? What cosmetic products you use. ? What you drink. ? What you wear. This includes jewelry.  Keep all follow-up visits as told by your health care provider. This is important. Contact a health care provider if:  You sweat at night.  You lose weight.  You urinate more than normal.  You feel weak.  You vomit.  Your skin or the whites of your eyes look yellow (jaundice).  Your skin: ? Tingles. ? Is  numb.  Your rash: ? Does not go away after several days. ? Gets worse.  You are: ? Unusually thirsty. ? More tired than normal.  You have: ? New symptoms. ? Pain in your abdomen. ? A fever. ? Diarrhea. Get help right away if:  You develop a rash that covers all or most of your body. The rash may or may not be painful.  You develop blisters that: ? Are on top of the rash. ? Grow larger or grow together. ? Are painful. ? Are inside your nose or mouth.  You develop a rash that: ? Looks like purple pinprick-sized spots all over your body. ? Has a "bull's eye" or looks like a target. ? Is not related to sun exposure, is red and painful, and causes your skin to peel. This information is not intended to replace advice given to you by your health care provider. Make sure you discuss any questions you have with your health care provider. Document Released: 08/15/2002 Document Revised: 01/29/2016 Document Reviewed: 01/10/2015 Elsevier Interactive Patient Education  2018 Elsevier Inc.  

## 2018-03-16 NOTE — Progress Notes (Signed)
Pt presents with raised itchy red rash on bilat arms & back.  Denies CP/SOB/trouble swallowing.  Afebrile.  A&Ox4.

## 2018-03-17 ENCOUNTER — Ambulatory Visit
Admission: RE | Admit: 2018-03-17 | Discharge: 2018-03-17 | Disposition: A | Discharge status: Home / Self Care | Payer: Medicare Other | Source: Ambulatory Visit | Attending: Radiation Oncology | Admitting: Radiation Oncology

## 2018-03-17 ENCOUNTER — Encounter: Payer: Self-pay | Admitting: Radiation Oncology

## 2018-03-17 DIAGNOSIS — Z510 Encounter for antineoplastic radiation therapy: Secondary | ICD-10-CM | POA: Diagnosis not present

## 2018-03-17 DIAGNOSIS — C160 Malignant neoplasm of cardia: Secondary | ICD-10-CM | POA: Diagnosis not present

## 2018-03-18 NOTE — Progress Notes (Signed)
Symptoms Management Clinic Progress Note   Larry Bautista [id number] 03-10-1941 77 y.o.  Larry Bautista is managed by Dr. Dominica Severin B. [place]  Actively treated with chemotherapy/immunotherapy: no  Current Therapy: Radiation only.  Patient was previously treated with carboplatin and paclitaxel which she is completed.  Last Treated: 03/03/2018 (cycle 1, day 29) 03/16/2018 (radiation therapy)  Assessment: Plan:    Urticaria - Plan: predniSONE (DELTASONE) tablet 40 mg, predniSONE (DELTASONE) 5 MG tablet, triamcinolone lotion (KENALOG) 0.1 %  Gastroesophageal cancer (HCC)  Urticaria of unknown etiology.  The patient was given prednisone 40 mg while here and was begun on a prednisone taper which she will start tomorrow.  He was also given a prescription for triamcinolone lotion.  Gastroesophageal cancer: The patient continues on radiation therapy.  He is scheduled to see Dr. Benay Spice in follow-up on 03/25/2018.  Please see After Visit Summary for patient specific instructions.  Future Appointments  Date Time Provider [place]  03/25/2018  9:30 AM [facility] 3 [facility] None  03/25/2018 10:00 AM Ladell Pier, MD [facility] None  04/15/2018  2:30 PM Grace Isaac, MD [facility]  05/03/2018 11:30 AM Noralee Space, MD [facility] None  05/05/2018  1:30 PM Hayden Pedro, PA-C [facility] None  08/10/2018 11:30 AM Noralee Space, MD [facility] None  11/19/2018 10:00 AM [facility] ADVIS[facility] None  11/19/2018 10:40 AM [name], MD [facility] None    No orders of the defined types were placed in this encounter.      Subjective:   Patient ID:  Larry Bautista is a 77 y.o. (DOB 20-Mar-1941) male.  Chief Complaint:  Chief Complaint  Patient presents with  . Rash    HPI Larry Bautista is a 77 year old male with a history of gastroesophageal cancer who is managed by Dr. Dominica Severin B. [facility].  The patient was treated with  carboplatin and Taxotere which she completed on 03/03/2018.  He is currently receiving radiation therapy.  He has developed an erythematous pruritic rash over his forearms and back over the last 3 days.  He tried cortisone cream yesterday with only minimal relief.  He denies any difficulty swallowing or chest tightness.  He denies fevers, chills, or shortness of breath.  Medications: I have reviewed the patient's current medications.  Allergies:  Allergies  Allergen Reactions  . Celecoxib Rash  . Lisinopril Cough  . Simvastatin Other (See Comments)    Leg pain    Past Medical History:  Diagnosis Date  . Allergy   . CHF (congestive heart failure) ([place])   . Chronic knee pain   . Coronary artery disease    with LAD DE stent 2004  . Heel spur, left   . Hyperlipidemia   . Hypertension   . Kidney stones   . Lower back pain    status post surgery for spondylolisthesis  . Migraine   . Persistent atrial fibrillation ([place])    longstanding persistent (since 05/2008)  . Plantar fasciitis     Past Surgical History:  Procedure Laterality Date  . BACK SURGERY     spondylolisthesis  . CARDIAC CATHETERIZATION  03/06/2003  . CORONARY ANGIOPLASTY WITH STENT PLACEMENT  04/2003   CYPHER stent implantation in the LAD  . ESOPHAGOGASTRODUODENOSCOPY (EGD) WITH PROPOFOL N/A 01/07/2018   Procedure: ESOPHAGOGASTRODUODENOSCOPY (EGD) WITH PROPOFOL;  Surgeon: Wonda Horner, MD;  Location: [facility] ENDOSCOPY;  Service: Endoscopy;  Laterality: N/A;  . HAND SURGERY Right 2014   carpal tunnel  .  SKIN CANCER EXCISION  1980's   Face  . VASCULAR SURGERY     Vascular Stent    Family History  Problem Relation Age of Onset  . Heart attack Father   . Colon cancer Father   . Congestive Heart Failure Father   . Atrial fibrillation Brother   . Stroke Brother   . Bladder Cancer Brother   . Lung cancer Mother   . Atrial fibrillation Brother   . COPD Brother   . Congestive Heart Failure Brother   . Lung cancer  Brother   . Migraines Daughter     Social History   Socioeconomic History  . Marital status: Married    Spouse name: Brice Kossman  . Number of children: 1  . Years of education: Not on file  . Highest education level: Bachelor's degree (e.g., BA, AB, BS)  Occupational History  . Occupation: retired  Scientific laboratory technician  . Financial resource strain: Not hard at all  . Food insecurity:    Worry: Never true    Inability: Never true  . Transportation needs:    Medical: No    Non-medical: No  Tobacco Use  . Smoking status: Former Smoker    Packs/day: 2.00    Years: 35.00    Pack years: 70.00    Types: Cigarettes    Start date: 09/08/1954    Last attempt to quit: 11/06/1989    Years since quitting: 28.3  . Smokeless tobacco: Never Used  . Tobacco comment: Smoked from [phone] (35 years)  Substance and Sexual Activity  . Alcohol use: Not Currently    Alcohol/week: 1.2 oz    Types: 2 Standard drinks or equivalent per week    Comment: 1961-2009 (Last drink 2009)   . Drug use: No  . Sexual activity: Not on file  Lifestyle  . Physical activity:    Days per week: Not on file    Minutes per session: Not on file  . Stress: Not at all  Relationships  . Social connections:    Talks on phone: Not on file    Gets together: Not on file    Attends religious service: Not on file    Active member of club or organization: Not on file    Attends meetings of clubs or organizations: Not on file    Relationship status: Not on file  . Intimate partner violence:    Fear of current or ex partner: Not on file    Emotionally abused: Not on file    Physically abused: Not on file    Forced sexual activity: Not on file  Other Topics Concern  . Not on file  Social History Narrative   Retired Insurance underwriter. Married in 2015. Has one daughter, and 4 grandchildren. He lives in [place].    Past Medical History, Surgical history, Social history, and Family history were reviewed and updated as  appropriate.   Please see review of systems for further details on the patient's review from today.   Review of Systems:  Review of Systems  Constitutional: Negative for chills, diaphoresis and fever.  HENT: Negative for mouth sores, sore throat and trouble swallowing.   Respiratory: Negative for chest tightness and shortness of breath.   Cardiovascular: Negative for chest pain.  Skin: Positive for rash.    Objective:   Physical Exam:  BP 131/86 (BP Location: Right Arm, Patient Position: Sitting)   Pulse 69   Temp 98.2 F (36.8 C) (Oral)   Resp 17  Ht 5\' 8"  (1.727 m)   Wt 174 lb 9.6 oz (79.2 kg)   SpO2 99%   BMI 26.55 kg/m  ECOG: 0  Physical Exam  Constitutional: No distress.  HENT:  Head: Normocephalic and atraumatic.  Mouth/Throat: Oropharynx is clear and moist.  Cardiovascular: Normal rate, regular rhythm, normal heart sounds and intact distal pulses. Exam reveals no gallop and no friction rub.  No murmur heard. Pulmonary/Chest: Effort normal and breath sounds normal. No stridor. No respiratory distress. He has no wheezes. He has no rales.  Musculoskeletal: He exhibits no edema or deformity.  Neurological: He is alert. Coordination normal.  Skin: Skin is warm and dry. Rash noted. He is not diaphoretic. There is erythema.       Lab Review:     Component Value Date/Time   NA 143 03/03/2018 1100   NA 142 12/11/2017 1128   K 3.7 03/03/2018 1100   CL 108 03/03/2018 1100   CO2 26 03/03/2018 1100   GLUCOSE 107 (H) 03/03/2018 1100   BUN 12 03/03/2018 1100   BUN 18 12/11/2017 1128   CREATININE 0.78 03/03/2018 1100   CALCIUM 9.1 03/03/2018 1100   PROT 6.0 (L) 03/03/2018 1100   PROT 7.1 12/11/2017 1128   ALBUMIN 3.8 03/03/2018 1100   ALBUMIN 4.6 12/11/2017 1128   AST 17 03/03/2018 1100   ALT 15 03/03/2018 1100   ALKPHOS 44 03/03/2018 1100   BILITOT 0.6 03/03/2018 1100   GFRNONAA >60 03/03/2018 1100   GFRAA >60 03/03/2018 1100       Component Value  Date/Time   WBC 3.1 (L) 03/09/2018 1004   WBC 8.6 01/08/2018 0749   RBC 4.16 (L) 03/09/2018 1004   HGB 13.2 03/09/2018 1004   HGB 9.0 (L) 01/12/2018 1554   HCT 39.4 03/09/2018 1004   HCT 27.3 (L) 01/12/2018 1554   PLT 94 (L) 03/09/2018 1004   PLT 221 01/12/2018 1554   MCV 94.7 03/09/2018 1004   MCV 97 01/12/2018 1554   MCH 31.7 03/09/2018 1004   MCHC 33.5 03/09/2018 1004   RDW 13.8 03/09/2018 1004   RDW 18.3 (H) 01/12/2018 1554   LYMPHSABS 0.5 (L) 03/09/2018 1004   LYMPHSABS 1.4 11/13/2016 1056   MONOABS 0.3 03/09/2018 1004   EOSABS 0.0 03/09/2018 1004   EOSABS 0.1 11/13/2016 1056   BASOSABS 0.1 03/09/2018 1004   BASOSABS 0.0 11/13/2016 1056   -------------------------------  Imaging from last 24 hours (if applicable):  Radiology interpretation: No results found.      This case was discussed with Dr. Benay Spice. He expressed agreement with my management of this patient.

## 2018-03-22 DIAGNOSIS — R3911 Hesitancy of micturition: Secondary | ICD-10-CM | POA: Diagnosis not present

## 2018-03-22 DIAGNOSIS — N401 Enlarged prostate with lower urinary tract symptoms: Secondary | ICD-10-CM | POA: Diagnosis not present

## 2018-03-22 DIAGNOSIS — N4289 Other specified disorders of prostate: Secondary | ICD-10-CM | POA: Diagnosis not present

## 2018-03-25 ENCOUNTER — Telehealth: Payer: Self-pay | Admitting: Nurse Practitioner

## 2018-03-25 ENCOUNTER — Encounter: Payer: Self-pay | Admitting: Oncology

## 2018-03-25 ENCOUNTER — Inpatient Hospital Stay (HOSPITAL_BASED_OUTPATIENT_CLINIC_OR_DEPARTMENT_OTHER): Payer: Medicare Other | Admitting: Oncology

## 2018-03-25 ENCOUNTER — Inpatient Hospital Stay: Payer: Medicare Other

## 2018-03-25 VITALS — BP 107/83 | HR 79 | Temp 97.5°F | Resp 17 | Ht 68.0 in | Wt 176.7 lb

## 2018-03-25 DIAGNOSIS — C160 Malignant neoplasm of cardia: Secondary | ICD-10-CM | POA: Diagnosis not present

## 2018-03-25 DIAGNOSIS — R1310 Dysphagia, unspecified: Secondary | ICD-10-CM

## 2018-03-25 DIAGNOSIS — R21 Rash and other nonspecific skin eruption: Secondary | ICD-10-CM | POA: Diagnosis not present

## 2018-03-25 DIAGNOSIS — L509 Urticaria, unspecified: Secondary | ICD-10-CM

## 2018-03-25 DIAGNOSIS — G893 Neoplasm related pain (acute) (chronic): Secondary | ICD-10-CM

## 2018-03-25 DIAGNOSIS — K210 Gastro-esophageal reflux disease with esophagitis: Secondary | ICD-10-CM | POA: Diagnosis not present

## 2018-03-25 LAB — CBC WITH DIFFERENTIAL (CANCER CENTER ONLY)
BASOS PCT: 1 %
Basophils Absolute: 0 10*3/uL (ref 0.0–0.1)
EOS ABS: 0.1 10*3/uL (ref 0.0–0.5)
EOS PCT: 2 %
HCT: 39.2 % (ref 38.4–49.9)
HEMOGLOBIN: 13.1 g/dL (ref 13.0–17.1)
Lymphocytes Relative: 4 %
Lymphs Abs: 0.2 10*3/uL — ABNORMAL LOW (ref 0.9–3.3)
MCH: 31.5 pg (ref 27.2–33.4)
MCHC: 33.3 g/dL (ref 32.0–36.0)
MCV: 94.4 fL (ref 79.3–98.0)
MONOS PCT: 20 %
Monocytes Absolute: 1 10*3/uL — ABNORMAL HIGH (ref 0.1–0.9)
NEUTROS PCT: 73 %
Neutro Abs: 3.6 10*3/uL (ref 1.5–6.5)
PLATELETS: 140 10*3/uL (ref 140–400)
RBC: 4.15 MIL/uL — ABNORMAL LOW (ref 4.20–5.82)
RDW: 16.2 % — ABNORMAL HIGH (ref 11.0–14.6)
WBC: 4.9 10*3/uL (ref 4.0–10.3)

## 2018-03-25 NOTE — Progress Notes (Signed)
  [facility] OFFICE PROGRESS NOTE   Diagnosis: Gastroesophageal cancer  INTERVAL HISTORY:   Mr. Centrella returns as scheduled.  He reports improvement in odynophagia.  He continues to have intermittent jaw pain.  He reports worsened reflux symptoms in the mornings. He developed a rash over the chest and arms.  He was seen in the symptom management clinic on 03/16/2018 he was prescribed a prednisone Dosepak and triamcinolone lotion.  He reports these did not help the rash.  Pruritus is relieved when he uses a cream prescribed by radiation oncology.  The rash is improving.  Objective:  Vital signs in last 24 hours:  Blood pressure 107/83, pulse 79, temperature (!) 97.5 F (36.4 C), temperature source Oral, resp. rate 17, height 5\' 8"  (1.727 m), weight 176 lb 11.2 oz (80.2 kg), SpO2 99 %.    Resp: End inspiratory rhonchi at the posterior base bilaterally, no respiratory distress Cardio: Irregular GI: No hepatomegaly, nontender Vascular: Trace ankle edema bilaterally  Skin: Erythematous rash in a radiation distribution at the mid abdomen and back.  There is a fading erythematous rash outside of the radiation distribution over the trunk and arms    Lab Results:  Lab Results  Component Value Date   WBC 4.9 03/25/2018   HGB 13.1 03/25/2018   HCT 39.2 03/25/2018   MCV 94.4 03/25/2018   PLT 140 03/25/2018   NEUTROABS 3.6 03/25/2018     Imaging:  No results found.  Medications: I have reviewed the patient's current medications.   Assessment/Plan:  1. Adenocarcinoma of the GE junction ? Partially obstructing mass noted at the GE junction on endoscopy 01/07/2018, nonobstructing ? CTs 01/14/2018, sub-solid 5 mm right upper lobe nodule, mild eccentric wall thickening at the GE junction ? PET scan 01/27/2018-hypermetabolism at the GE junction, no evidence of metastatic disease ? Initiation of radiation and weekly Taxol/carboplatin 02/03/2018 ? Week 2 Taxol/carboplatin  02/10/2018 ? Week 3 Taxol/carboplatin 02/17/2018 ? Week 4 Taxol/carboplatin 02/24/2018 ? Week 5 Taxol/carboplatin 03/03/2018 2. GI bleeding secondary to #1 3. Atrial fibrillation-maintained on Coumadin prior to hospital admission 01/07/2018 4. Coronary artery disease 5. Chronic exertional dyspnea-likely COPD 6. History of tobacco use 7. History of kidney stones 8. Focus of hypermetabolism at the right prostate on the PET scan 01/27/2018, PSA normal on 02/10/2018 9. Thrombocytopenia secondary to chemotherapy 10. Odynophagia secondary to chemotherapy/radiation-improved 11. Rash-most likely secondary to radiation, though the rash is in an atypical distribution  Disposition: Mr. Vanderloop has completed the course of chemotherapy and radiation for treatment of gastroesophageal cancer.  The symptoms of radiation esophagitis are improving.  He developed a rash last week.  This is most likely an atypical rash related to chest radiation.  He will continue the Sonafine cream prescribed by radiation oncology.  Mr. Jha is scheduled to see Dr. Servando Snare on 04/15/2018.  We will see him the same day.  We will discuss resuming Coumadin when he is here on 04/15/2018.  15 minutes were spent with the patient today.  The majority of the time was used for counseling and coordination of care.  Betsy Coder, MD  03/25/2018  10:39 AM

## 2018-03-25 NOTE — Telephone Encounter (Signed)
Scheduled appt per 7/18 los - gave patient AVS and calender per los.  

## 2018-03-31 NOTE — Progress Notes (Signed)
  Radiation Oncology         [phone] ________________________________  Name: Larry Bautista MRN: [id number]  Date: 03/17/2018  DOB: 1941/04/16  End of Treatment Note  Diagnosis:   77 y.o. male with Adenocarcinoma of the GE junction     Indication for treatment:  Curative       Radiation treatment dates:   02/03/2018 - 03/17/2018  Site/dose:   The distal esophagus/GE junction was treated to 45 Gy in 25 fractions initially. The patient then received a 9 Gy boost delivered in 5 fractions for a total dose of 54 Gy.  Beams/energy:   IMRT / 6X Photon  Narrative: The patient tolerated radiation treatment relatively well.  He experienced moderate fatigue and increased esophagitis over the course of treatment. His oral intake has decreased as a result and he has a total of 18 pounds. He is using Carafate as directed for pain. He also reports jaw pain associated with radiation which he does have pain medication for.  Plan: The patient has completed radiation treatment. The patient will return to radiation oncology clinic for routine followup in one month. I advised them to call or return sooner if they have any questions or concerns related to their recovery or treatment.  ------------------------------------------------  Jodelle Gross, MD, PhD  This document serves as a record of services personally performed by Kyung Rudd, MD. It was created on his behalf by Rae Lips, a trained medical scribe. The creation of this record is based on the scribe's personal observations and the provider's statements to them. This document has been checked and approved by the attending provider.

## 2018-04-01 ENCOUNTER — Telehealth: Payer: Self-pay | Admitting: Interventional Cardiology

## 2018-04-01 NOTE — Telephone Encounter (Signed)
New message   If Home Health RN is calling please get Coumadin Nurse on the phone STAT  1.  Are you calling in regards to an appointment? yes  2.  Are you calling for a refill ? no  3.  Are you having bleeding issues? no  4.  Do you need clearance to hold Coumadin? No  Pt is unsure if he needs to make a coumadin appt. Pls advise.   Please route to the Coumadin Clinic Pool

## 2018-04-02 NOTE — Telephone Encounter (Signed)
Returned call to pt, pt inquiring about when he is to resume Coumadin.  Per Dr Gearldine Shown OV note from 03/25/18 pt is seeing Dr Servando Snare and oncology again on 04/15/18 and they will discuss resuming Coumadin at that time.  Advised pt per Dr Gearldine Shown note he is to remain off Coumadin until f/u on 04/15/18 and they discuss possibly resuming at that time.  Requested pt call us back for appt once he has been given the ok to resume Coumadin by his oncologist.  Pt verbalized understanding.

## 2018-04-08 ENCOUNTER — Ambulatory Visit: Payer: Medicare Other | Admitting: Cardiothoracic Surgery

## 2018-04-15 ENCOUNTER — Encounter: Payer: Self-pay | Admitting: Cardiothoracic Surgery

## 2018-04-15 ENCOUNTER — Inpatient Hospital Stay: Payer: Medicare Other | Attending: Oncology | Admitting: Nurse Practitioner

## 2018-04-15 ENCOUNTER — Other Ambulatory Visit: Payer: Self-pay | Admitting: *Deleted

## 2018-04-15 ENCOUNTER — Other Ambulatory Visit: Payer: Self-pay

## 2018-04-15 ENCOUNTER — Ambulatory Visit (INDEPENDENT_AMBULATORY_CARE_PROVIDER_SITE_OTHER): Payer: Medicare Other | Admitting: Cardiothoracic Surgery

## 2018-04-15 ENCOUNTER — Telehealth: Payer: Self-pay | Admitting: Nurse Practitioner

## 2018-04-15 ENCOUNTER — Encounter: Payer: Self-pay | Admitting: Nurse Practitioner

## 2018-04-15 VITALS — BP 110/70 | HR 67 | Resp 16 | Ht 68.0 in | Wt 182.0 lb

## 2018-04-15 VITALS — BP 113/83 | HR 74 | Temp 97.6°F | Resp 17 | Ht 68.0 in | Wt 182.5 lb

## 2018-04-15 DIAGNOSIS — R21 Rash and other nonspecific skin eruption: Secondary | ICD-10-CM

## 2018-04-15 DIAGNOSIS — Z87891 Personal history of nicotine dependence: Secondary | ICD-10-CM | POA: Diagnosis not present

## 2018-04-15 DIAGNOSIS — C155 Malignant neoplasm of lower third of esophagus: Secondary | ICD-10-CM | POA: Diagnosis not present

## 2018-04-15 DIAGNOSIS — D6959 Other secondary thrombocytopenia: Secondary | ICD-10-CM

## 2018-04-15 DIAGNOSIS — R1310 Dysphagia, unspecified: Secondary | ICD-10-CM

## 2018-04-15 DIAGNOSIS — Z87442 Personal history of urinary calculi: Secondary | ICD-10-CM | POA: Diagnosis not present

## 2018-04-15 DIAGNOSIS — Z923 Personal history of irradiation: Secondary | ICD-10-CM | POA: Diagnosis not present

## 2018-04-15 DIAGNOSIS — Z7901 Long term (current) use of anticoagulants: Secondary | ICD-10-CM | POA: Diagnosis not present

## 2018-04-15 DIAGNOSIS — D62 Acute posthemorrhagic anemia: Secondary | ICD-10-CM | POA: Diagnosis not present

## 2018-04-15 DIAGNOSIS — I2510 Atherosclerotic heart disease of native coronary artery without angina pectoris: Secondary | ICD-10-CM

## 2018-04-15 DIAGNOSIS — C160 Malignant neoplasm of cardia: Secondary | ICD-10-CM

## 2018-04-15 DIAGNOSIS — D490 Neoplasm of unspecified behavior of digestive system: Secondary | ICD-10-CM

## 2018-04-15 NOTE — Progress Notes (Signed)
[facility] OFFICE PROGRESS NOTE   Diagnosis: Gastroesophageal cancer  INTERVAL HISTORY:   Mr. Setters returns as scheduled.  Reflux is better.  Rash over the trunk and extremities has also improved and is no longer pruritic.  He is tolerating a fairly regular diet.  He has a good appetite.  No bleeding.  Objective:  Vital signs in last 24 hours:  Blood pressure 113/83, pulse 74, temperature 97.6 F (36.4 C), temperature source Oral, resp. rate 17, height 5\' 8"  (1.727 m), weight 182 lb 8 oz (82.8 kg), SpO2 98 %.    HEENT: No thrush or ulcers.  Resp: End expiratory rhonchi at the left lung base.  No respiratory distress. Cardio: Irregular. GI: Abdomen soft and nontender.  No hepatomegaly. Vascular: Trace edema at the lower legs bilaterally.    Skin: Hyperpigmentation/dryness across the mid back.  Fading erythematous rash over the arms.   Lab Results:  Lab Results  Component Value Date   WBC 4.9 03/25/2018   HGB 13.1 03/25/2018   HCT 39.2 03/25/2018   MCV 94.4 03/25/2018   PLT 140 03/25/2018   NEUTROABS 3.6 03/25/2018    Imaging:  No results found.  Medications: I have reviewed the patient's current medications.  Assessment/Plan: 1. Adenocarcinoma of the GE junction ? Partially obstructing mass noted at the GE junction on endoscopy 01/07/2018, nonobstructing ? CTs 01/14/2018, sub-solid 5 mm right upper lobe nodule, mild eccentric wall thickening at the GE junction ? PET scan 01/27/2018-hypermetabolism at the GE junction, no evidence of metastatic disease ? Initiation of radiation and weekly Taxol/carboplatin 02/03/2018 ? Week 2 Taxol/carboplatin 02/10/2018 ? Week 3 Taxol/carboplatin 02/17/2018 ? Week 4 Taxol/carboplatin 02/24/2018 ? Week 5 Taxol/carboplatin 03/03/2018 ? Radiation completed 03/17/2018 2. GI bleeding secondary to #1 3. Atrial fibrillation-maintained on Coumadin prior to hospital admission 01/07/2018 4. Coronary artery disease 5. Chronic  exertional dyspnea-likely COPD 6. History of tobacco use 7. History of kidney stones 8. Focus of hypermetabolism at the right prostate on the PET scan 01/27/2018, PSA normal on 02/10/2018 9. History of thrombocytopenia secondary to chemotherapy 10. Odynophagia secondary to chemotherapy/radiation-improved 11. Rash-most likely secondary to radiation, though the rash is in an atypical distribution; improved  Disposition: Mr. Roosevelt appears stable.  He has a follow-up appointment with Dr. Servando Snare this afternoon.    The rash is better.  We contacted radiation oncology regarding continued use of the Sonafine cream.  They recommend he use a vitamin E cream.  We provided Mr. Copenhaver and his wife with this information.  Dr. Ammie Dalton recommends he resume Coumadin.  He will contact Dr. Thompson Caul office to arrange for follow-up in their Coumadin clinic.  He will contact us with any bleeding.  He will return for a follow-up visit in 2 to 3 months.  He will contact the office in the interim as outlined above or with any other problems.  Patient seen with Dr. Benay Spice.  25 minutes were spent face-to-face at today's visit with the majority of that time involved in counseling/coordination of care.    Betsy Coder ANP/GNP-BC   04/15/2018  5:12 PM  This was a shared visit with Ned Card.  Mr. Sherod has completed treatment with chemotherapy/radiation for GE junction carcinoma.  There has been no recurrent bleeding.  He will resume Coumadin via the cardiology anticoagulation clinic.  He is scheduled to see Dr. Servando Snare later today.  He will return for an office visit in 2 to 3 months.  We will refer him for a restaging  upper endoscopy depending on the decision for surgery.  Julieanne Manson, MD

## 2018-04-15 NOTE — Telephone Encounter (Signed)
Scheduled appt per 8/8 los - left vmail and sent reminder letter in the mail with appt date and time.

## 2018-04-15 NOTE — Progress Notes (Signed)
[address]       [place] [address]             [phone]                    Larry Bautista Wallace Medical Record #[id number] Date of Birth: [birth year]  Referring: Ladell Pier, MD Primary Care: [name], MD Primary Cardiologist: No primary care provider on file.  Chief Complaint:    Chief Complaint  Patient presents with  . Esophageal Cancer    12 wk f/u after completion of chemo/radiation    History of Present Illness:    Larry Bautista 77 y.o. male is seen in the office  today for f distal esophageal adenocarcinoma.  The patient has had no previous history of esophageal problems or GI bleed.  He noted several months of increasing weakness and fatigue.  He denied any weight loss.  He noted increasing episodes of wheezing along with the fatigue and thought his underlying COPD was worse.  3 days prior to admission he developed black stools.  He then on the day of admission became very weak nauseated and vomited vomited bright red blood.   The patient had been on Coumadin for 4 to 5 years for chronic atrial fibrillation after admission his Coumadin was reversed endoscopy showed a distal esophageal mass pathology showed adenocarcinoma CT scan of the chest and abdomen was performed.  The patient was given 2 units of packed red blood cells.  He was then discharged from the hospital on aspirin and Coumadin and told to go and make an appointment at the cancer center and in the thoracic surgery office.   When he was seen in the oncology office by Dr. Benay Spice his Coumadin and aspirin was stopped.  Since last seen he has proceeded with: Initiation of radiation and weekly Taxol/carboplatin 02/03/2018 Week 2 Taxol/carboplatin 02/10/2018 Week 3 Taxol/carboplatin 02/17/2018 Week 4 Taxol/carboplatin 02/24/2018 Week 5 Taxol/carboplatin 03/03/2018 Radiation completed 03/17/2018  Patient is able to take a p.o. diet carefully.  He notes that he remains weak  but is slowly gaining strength.  He is noted no further GI bleeding  Current Activity/ Functional Status:  Patient is independent with mobility/ambulation, transfers, ADL's, IADL's.   Zubrod Score: At the time of surgery this patient's most appropriate activity status/level should be described as: []     0    Normal activity, no symptoms [x]     1    Restricted in physical strenuous activity but ambulatory, able to do out light work []     2    Ambulatory and capable of self care, unable to do work activities, up and about               >50 % of waking hours                              []     3    Only limited self care, in bed greater than 50% of waking hours []     4    Completely disabled, no self care, confined to bed or chair []     5    Moribund   Past Medical History:  Diagnosis Date  . Allergy   . CHF (congestive heart failure) ([place])   . Chronic knee pain   . Coronary artery disease    with LAD DE stent 2004  .  Heel spur, left   . Hyperlipidemia   . Hypertension   . Kidney stones   . Lower back pain    status post surgery for spondylolisthesis  . Migraine   . Persistent atrial fibrillation ([place])    longstanding persistent (since 05/2008)  . Plantar fasciitis     Past Surgical History:  Procedure Laterality Date  . BACK SURGERY     spondylolisthesis  . CARDIAC CATHETERIZATION  03/06/2003  . CORONARY ANGIOPLASTY WITH STENT PLACEMENT  04/2003   CYPHER stent implantation in the LAD  . ESOPHAGOGASTRODUODENOSCOPY (EGD) WITH PROPOFOL N/A 01/07/2018   Procedure: ESOPHAGOGASTRODUODENOSCOPY (EGD) WITH PROPOFOL;  Surgeon: Wonda Horner, MD;  Location: [facility] ENDOSCOPY;  Service: Endoscopy;  Laterality: N/A;  . HAND SURGERY Right 2014   carpal tunnel  . SKIN CANCER EXCISION  1980's   Face  . VASCULAR SURGERY     Vascular Stent    Family History  Problem Relation Age of Onset  . Heart attack Father   . Colon cancer Father   . Congestive Heart Failure Father   . Atrial  fibrillation Brother   . Stroke Brother   . Bladder Cancer Brother   . Lung cancer Mother   . Atrial fibrillation Brother   . COPD Brother   . Congestive Heart Failure Brother   . Lung cancer Brother   . Migraines Daughter    Family history significant for both his brother and mother who died of lung cancer, father died of colon cancer one sister had a neurological degenerative disease he has one daughter and one stepdaughter  Social History   Tobacco Use  Smoking Status Former Research scientist (life sciences)  . Packs/day: 2.00  . Years: 35.00  . Pack years: 70.00  . Types: Cigarettes  . Start date: 09/08/1954  . Last attempt to quit: 11/06/1989  . Years since quitting: 28.4  Smokeless Tobacco Never Used  Tobacco Comment   Smoked from [phone] (35 years)    Social History   Substance and Sexual Activity  Alcohol Use Not Currently  . Alcohol/week: 2.0 standard drinks  . Types: 2 Standard drinks or equivalent per week   Comment: 1961-2009 (Last drink 2009)      Allergies  Allergen Reactions  . Celecoxib Rash  . Lisinopril Cough  . Simvastatin Other (See Comments)    Leg pain    Current Outpatient Medications  Medication Sig Dispense Refill  . albuterol (PROAIR HFA) 108 (90 Base) MCG/ACT inhaler Inhale 1-2 puffs into the lungs every 6 (six) hours as needed for wheezing or shortness of breath. 1 Inhaler prn  . cetirizine (ZYRTEC) 10 MG tablet Take 10 mg by mouth daily.    . diphenhydramine-acetaminophen (TYLENOL PM) 25-500 MG TABS Take 1 tablet by mouth at bedtime as needed (for sleep).     [name] eplerenone (INSPRA) 25 MG tablet Take 1 tablet (25 mg total) by mouth daily. Please call [phone] to schedule appointment for additional refills thanks. (2nd attempt) 30 tablet 0  . fluticasone (FLONASE) 50 MCG/ACT nasal spray USE 2 SPRAYS IN EACH NOSTRIL DAILY 48 g 3  . gabapentin (NEURONTIN) 300 MG capsule Take 300 mg by mouth every other day.     [name] HYDROcodone-acetaminophen (NORCO) 5-325 MG tablet  Take 1 tablet by mouth every 4 (four) hours as needed for moderate pain. 30 tablet 0  . metoprolol succinate (TOPROL-XL) 25 MG 24 hr tablet Take 1 tablet (25 mg total) by mouth daily. Please call [phone] to schedule appointment for additional  refills thanks. (2nd attempt) 30 tablet 0  . nitroGLYCERIN (NITROSTAT) 0.4 MG SL tablet Place 0.4 mg under the tongue every 5 (five) minutes as needed.    . ondansetron (ZOFRAN ODT) 8 MG disintegrating tablet Take 1 tablet (8 mg total) by mouth every 8 (eight) hours as needed for nausea or vomiting. 30 tablet 0  . pantoprazole (PROTONIX) 40 MG tablet TAKE ONE (1) TABLET EACH DAY 90 tablet 0  . prochlorperazine (COMPAZINE) 5 MG tablet Take 1 tablet (5 mg total) by mouth every 6 (six) hours as needed for nausea or vomiting. 30 tablet 0  . psyllium (METAMUCIL) 58.6 % powder Take 1 packet by mouth daily.     . tamsulosin (FLOMAX) 0.4 MG CAPS capsule Take 0.4 mg by mouth daily after supper.    . triamcinolone lotion (KENALOG) 0.1 % Apply 1 application topically 3 (three) times daily. 60 mL 2  . warfarin (COUMADIN) 2.5 MG tablet Take 2.5 mg by mouth daily.    [name] warfarin (COUMADIN) 5 MG tablet TAKE ONE TABLET DAILY AS DIRECTED (Patient not taking: Reported on 04/15/2018) 90 tablet 0   No current facility-administered medications for this visit.     Pertinent items are noted in HPI.   Review of Systems:     Cardiac Review of Systems: [Y] = yes  or   [ N ] = no   Chest Pain [  N ]  Resting SOB [Y  ] Exertional SOB  [Y ]  Vertell Limber Aqua.Slicker  ]   Pedal Edema [ Y  ]    Palpitations [N] Syncope  Aqua.Slicker  ]   Presyncope [ Y  ]   General Review of Systems: [Y] = yes [  ]=no Constitional: recent weight change [  ];  Wt loss over the last 3 months [   ] anorexia [  ]; fatigue Blue.Reese  ]; nausea [  ]; night sweats [  ]; fever [  ]; or chills [  ];           Eye : blurred vision [  ]; diplopia [   ]; vision changes [  ];  Amaurosis fugax[  ]; Resp: cough [  ];  wheezing[  ];   hemoptysis[  ]; shortness of breath[  ]; paroxysmal nocturnal dyspnea[  ]; dyspnea on exertion[  ]; or orthopnea[  ];  GI:  gallstones[  ], vomiting[y  ];  dysphagia[  ]; melena[ y ];  hematochezia Blue.Reese  ]; heartburn[  ];   Hx of  Colonoscopy[  ]; GU: kidney stones [  ]; hematuria[  ];   dysuria [  ];  nocturia[  ];  history of     obstruction [  ]; urinary frequency [ y ]             Skin: rash, swelling[  ];, hair loss[  ];  peripheral edema[  ];  or itching[  ]; Musculosketetal: myalgias[  ];  joint swelling[  ];  joint erythema[  ];  joint pain[ y ];  back pain[ y ];  Heme/Lymph: bruising[ y ];  bleeding[  ];  anemia[  ];  Neuro: TIA[  ];  headaches[  ];  stroke[  ];  vertigo[  ];  seizures[  ];   paresthesias[  ];  difficulty walking[  ];  Psych:depression[  ]; anxiety[  ];  Endocrine: diabetes[  ];  thyroid dysfunction[  ];  Immunizations: Flu up to date Blue.Reese  ]; Pneumococcal  up to date [ y ];  Other:    PHYSICAL EXAMINATION: BP 110/70 (BP Location: Right Arm, Patient Position: Sitting, Cuff Size: Large)   Pulse 67 Comment: IRREG  Resp 16   Ht 5\' 8"  (1.727 m)   Wt 182 lb (82.6 kg)   SpO2 95% Comment: ON RA  BMI 27.67 kg/m  General appearance: alert, cooperative and no distress Head: Normocephalic, without obvious abnormality, atraumatic Neck: no adenopathy, no carotid bruit, no JVD, supple, symmetrical, trachea midline and thyroid not enlarged, symmetric, no tenderness/mass/nodules Lymph nodes: Cervical, supraclavicular, and axillary nodes normal. Resp: clear to auscultation bilaterally Cardio: regular rate and rhythm, S1, S2 normal, no murmur, click, rub or gallop GI: soft, non-tender; bowel sounds normal; no masses,  no organomegaly Extremities: extremities normal, atraumatic, no cyanosis or edema and Homans sign is negative, no sign of DVT Neurologic: Grossly normal   Wt Readings from Last 3 Encounters:  04/15/18 182 lb (82.6 kg)  04/15/18 182 lb 8 oz (82.8 kg)  03/25/18  176 lb 11.2 oz (80.2 kg)   Diagnostic Studies & Laboratory data:  Recent Radiology Findings:    CLINICAL DATA:  Initial treatment strategy for gastroesophageal adenocarcinoma.  EXAM: NUCLEAR MEDICINE PET SKULL BASE TO THIGH  TECHNIQUE: 9.5 mCi F-18 FDG was injected intravenously. Full-ring PET imaging was performed from the skull base to thigh after the radiotracer. CT data was obtained and used for attenuation correction and anatomic localization.  Fasting blood glucose: 96 mg/dl  COMPARISON:  Multiple exams, including CT 01/14/2018  FINDINGS: Mediastinal blood pool activity: SUV max 2.7  NECK: No significant abnormal hypermetabolic activity in this region.  Incidental CT findings: Chronic bilateral maxillary sinusitis.  CHEST: No significant abnormal hypermetabolic activity in this region.  Incidental CT findings: Centrilobular emphysema. Mild scarring anteriorly in the right upper lobe. Mild atelectasis in the left lower lobe along the diaphragm. Coronary, aortic arch, and branch vessel atherosclerotic vascular disease. Mild cardiomegaly.  ABDOMEN/PELVIS: Subtle accentuated activity at the gastroesophageal junction/proximal stomach, maximum SUV 5.2. Further distally in the stomach body a representative maximum SUV is 3.4. No appreciable hypermetabolic activity in the liver. No hypermetabolic adenopathy in the abdomen/pelvis. Photopenic cyst of the left kidney upper pole.  Accentuated activity in the sigmoid colon is likely physiologic.  Focally accentuated activity along the right posterior prostate gland in the vicinity of the apex is not in the expected location of the prostatic urethra, and has a maximum SUV of 6.8. Prostate cancer is a distinct possibility.  Incidental CT findings: Small hypodense lesions in the left hepatic lobe are stable from 2016 and highly likely to be benign.  Nonobstructive right nephrolithiasis. Photopenic cyst of the  left kidney lower pole and of the left kidney upper pole.  Aortoiliac atherosclerotic vascular disease.  SKELETON: No significant abnormal hypermetabolic activity in this region.  Incidental CT findings: Notable right subcoracoid bursitis and severe arthropathy of the right glenohumeral joint. Degenerative bilateral hip arthropathy.  IMPRESSION: 1. Low-grade accentuated activity in the proximal stomach adjacent to the gastroesophageal junction, maximum SUV 5.2. No findings of metastatic disease to the liver or regional lymph nodes. 2. There is a small focus of hypermetabolic activity in the right posterior apical peripheral zone of the prostate gland, maximum SUV 6.8-prostate cancer versus active inflammatory lesion. No local adenopathy observed. 3. Other imaging findings of potential clinical significance: Aortic Atherosclerosis (ICD10-I70.0) and Emphysema (ICD10-J43.9). Coronary atherosclerosis. Chronic bilateral maxillary sinusitis. Nonobstructive right nephrolithiasis. Notable right subcoracoid bursitis and prominent arthropathy of the  right glenohumeral joint. Degenerative bilateral hip arthropathy.   Electronically Signed   By: Van Clines M.D.   On: 01/27/2018 14:50     Ct Chest W Contrast  Result Date: 01/15/2018 CLINICAL DATA:  New diagnosis of nonobstructing gastroesophageal junction adenocarcinoma on 01/07/2018 upper endoscopy. EXAM: CT CHEST, ABDOMEN, AND PELVIS WITH CONTRAST TECHNIQUE: Multidetector CT imaging of the chest, abdomen and pelvis was performed following the standard protocol during bolus administration of intravenous contrast. CONTRAST:  134mL OMNIPAQUE IOHEXOL 300 MG/ML  SOLN COMPARISON:  12/08/2017 chest radiograph. 07/11/2015 CT abdomen/pelvis. FINDINGS: CT CHEST FINDINGS Cardiovascular: Normal heart size. No significant pericardial effusion/thickening. Left main and 3 vessel coronary atherosclerosis. Atherosclerotic nonaneurysmal thoracic  aorta. Normal caliber pulmonary arteries. No central pulmonary emboli. Mediastinum/Nodes: No discrete thyroid nodules. There is mild eccentric wall thickening posteriorly at the esophagogastric junction (series 2/image 52), without a discrete measurable mass by CT. No pathologically enlarged axillary, mediastinal or hilar lymph nodes. Lungs/Pleura: No pneumothorax. No pleural effusion. Moderate centrilobular emphysema with diffuse bronchial wall thickening. Sub solid right upper lobe 5 mm pulmonary nodule (series 4/image 36). No acute consolidative airspace disease, lung masses or additional significant pulmonary nodules. A few scattered small parenchymal bands at the lung bases, compatible with mild postinfectious/postinflammatory scarring. Musculoskeletal: No aggressive appearing focal osseous lesions. Right shoulder joint effusion/bursal collection anterior to the right scapula (series 2/image 6). Asymmetric advanced right shoulder osteoarthritis. Marked thoracic spondylosis. CT ABDOMEN PELVIS FINDINGS Hepatobiliary: Normal liver size. Subcentimeter hypodense left liver lobe lesions are too small to characterize and are stable since 07/11/2015 CT, considered benign. No new liver lesions. Normal gallbladder with no radiopaque cholelithiasis. No biliary ductal dilatation. Pancreas: Normal, with no mass or duct dilation. Spleen: Normal size. No mass. Adrenals/Urinary Tract: Normal adrenals. No hydronephrosis. Nonobstructing 5 mm lower right renal stone. Small simple renal cysts in both kidneys, largest 2.9 cm in the lateral upper left kidney. Additional subcentimeter hypodense renal cortical lesions in both kidneys are too small to characterize and require no follow-up. Normal bladder. Stomach/Bowel: Mild eccentric wall thickening posteriorly at the esophagogastric junction (series 2/image 52), without a discrete measurable mass by CT. Otherwise normal stomach. Normal caliber small bowel with no small bowel wall  thickening. Normal appendix. Normal large bowel with no diverticulosis, large bowel wall thickening or pericolonic fat stranding. Vascular/Lymphatic: Atherosclerotic nonaneurysmal abdominal aorta. Patent portal, splenic, hepatic and renal veins. No pathologically enlarged lymph nodes in the abdomen or pelvis. Reproductive: Mildly enlarged prostate with nonspecific internal prostatic calcifications. Other: No pneumoperitoneum, ascites or focal fluid collection. Small fat containing left inguinal hernia, stable. Musculoskeletal: No aggressive appearing focal osseous lesions. Marked lumbar spondylosis. Postsurgical changes from bilateral posterior spinal fusion at L5-S1. IMPRESSION: 1. Mild eccentric wall thickening posteriorly at the esophagogastric junction, without a discrete measurable mass by CT, compatible with the provided history of esophagogastric junction malignancy. 2. No locoregional adenopathy. No definite distant metastatic disease. 3. Solitary subsolid 5 mm right upper lobe pulmonary nodule. Recommend attention on follow-up chest CT in 3-6 months. 4. Chronic findings include: Aortic Atherosclerosis (ICD10-I70.0) and Emphysema (ICD10-J43.9). Left main and 3 vessel coronary atherosclerosis. Nonobstructing right nephrolithiasis. Mildly enlarged prostate. Stable small fat containing left inguinal hernia. Electronically Signed   By: Ilona Sorrel M.D.   On: 01/15/2018 16:34     I have independently reviewed the above radiology studies  and reviewed the findings with the patient.   Recent Lab Findings: Lab Results  Component Value Date   WBC 4.9 03/25/2018   HGB  13.1 03/25/2018   HCT 39.2 03/25/2018   PLT 140 03/25/2018   GLUCOSE 107 (H) 03/03/2018   CHOL 177 12/11/2017   TRIG 112 12/11/2017   HDL 57 12/11/2017   LDLDIRECT 136.6 08/24/2013   LDLCALC 98 12/11/2017   ALT 15 03/03/2018   AST 17 03/03/2018   NA 143 03/03/2018   K 3.7 03/03/2018   CL 108 03/03/2018   CREATININE 0.78 03/03/2018    BUN 12 03/03/2018   CO2 26 03/03/2018   TSH 0.47 11/06/2016   INR 1.1 01/12/2018   HGBA1C 6.0 (H) 12/11/2017   ENDO:/ no EUS done A medium-sized, ulcerating mass was found at the gastroesophageal junction. The mass was nonobstructing and not circumferential. Biopsies were taken with a cold forceps for histology. Findings: Erythematous mucosa was found in the prepyloric region of the stomach. The in the duodenum was normal.   PATH: Diagnosis Esophagogastric junction, biopsy - ADENOCARCINOMA. - SEE COMMENT. Microscopic Comment Dr. Vicente Males has reviewed the case and concurs with this interpretation. Dr. Penelope Coop was paged on 01/08/2018. Additional studies can be performed upon clinician request. (JBK:ah 01/08/18) Enid Cutter MD Pathologist, Electronic Signature (Case signed 01/08/2018)   Pulmonary function studies done in the pulmonary office 2018 FEV1 1.9 to 70% predicted DLCO 22.4 47% predicted No formal reading with the study   Assessment / Plan:   #1 adenocarcinoma of the GE junction, partially obstructing mass noted on endoscopy 01/07/2018, CT scan of the chest and abdomen shows small 5 mm right upper lobe lung nodule and eccentric wall thickening of the GE junction #2 patient presented with GI bleed secondary to #1 with blood loss anemia #3 history of atrial fibrillation had been on Coumadin prior to admission for GI bleed-Per oncology he will resume his Coumadin #4 known coronary artery disease #5 probable COPD-moderate  Patient has tolerated his current therapy reasonably well but remains weak.  In 3 weeks we will see the patient back with a restaging CT scan however at his current functional status age and underlying medical conditions I will be reluctant to recommend highly to him that we proceed with surgical resection of his esophagus.  He expresses a desire today to avoid surgery.    Grace Isaac MD      [address] [place]  [address] Office [phone]   Beeper [phone]  04/15/2018 6:23 PM

## 2018-04-19 ENCOUNTER — Telehealth: Payer: Self-pay | Admitting: Interventional Cardiology

## 2018-04-19 NOTE — Telephone Encounter (Signed)
Pt states he finished up his cancer txs about 3 weeks ago.  Pt said Dr. Benay Spice said he was going to reach out to Dr. Tamala Julian about something and see about getting him seen in the office.  Advised I have not received any messages about getting pt scheduled.  Advised I will send message to Dr. Tamala Julian to see if he has heard from Dr. Benay Spice.  Pt appreciative for call.   FYI Pt still currently off of Coumadin and states that Onc said this will likely be the case for a few more weeks

## 2018-04-19 NOTE — Telephone Encounter (Signed)
New Message   Pt is requesting a call back from the nurse. Does not want to disclose reason

## 2018-04-20 NOTE — Telephone Encounter (Signed)
I don't have anything to add and could not find anything from Dr. Benay Spice.

## 2018-04-21 ENCOUNTER — Telehealth: Payer: Self-pay | Admitting: Interventional Cardiology

## 2018-04-21 NOTE — Telephone Encounter (Signed)
Spoke with pt and he states that Dr. Benay Spice was wanting Dr. Thompson Caul opinion on if ok for pt to restart Coumadin?  He had 5 chemo tx and 30 radiation tx but finished these about 3 weeks ago.  Advised I would send to Dr. Tamala Julian and have CVRR team f/u with pt if ok to restart Coumadin.  Pt appreciative for call.

## 2018-04-21 NOTE — Telephone Encounter (Signed)
Left message to call back  

## 2018-04-21 NOTE — Telephone Encounter (Signed)
New Message: ° ° ° ° ° ° °Pt is returning a call °

## 2018-04-22 NOTE — Telephone Encounter (Signed)
CHADS VASC is > 3.  Chronic AF. Had GI bleed, I presume from esophageal cancer which has now been treated. I recommend resuming coumadin if we are far enough out from cancer therapy and all agree that bleeding risk is low??

## 2018-04-22 NOTE — Telephone Encounter (Signed)
Had GI bleeding in May. Per Dr. Everrett Coombe note on 04/15/18- no further GI bleeding.

## 2018-04-22 NOTE — Telephone Encounter (Signed)
Should resume anticoagulation since he has chronic AF, assuming he has no change in bleeding risk. Why did they hold coumadin?

## 2018-05-03 ENCOUNTER — Encounter: Payer: Self-pay | Admitting: Pulmonary Disease

## 2018-05-03 ENCOUNTER — Ambulatory Visit (INDEPENDENT_AMBULATORY_CARE_PROVIDER_SITE_OTHER): Payer: Medicare Other | Admitting: Pulmonary Disease

## 2018-05-03 VITALS — BP 118/70 | HR 74 | Temp 97.5°F | Ht 67.0 in | Wt 180.4 lb

## 2018-05-03 DIAGNOSIS — J4520 Mild intermittent asthma, uncomplicated: Secondary | ICD-10-CM

## 2018-05-03 DIAGNOSIS — I480 Paroxysmal atrial fibrillation: Secondary | ICD-10-CM | POA: Diagnosis not present

## 2018-05-03 DIAGNOSIS — I2510 Atherosclerotic heart disease of native coronary artery without angina pectoris: Secondary | ICD-10-CM

## 2018-05-03 DIAGNOSIS — I10 Essential (primary) hypertension: Secondary | ICD-10-CM

## 2018-05-03 DIAGNOSIS — C160 Malignant neoplasm of cardia: Secondary | ICD-10-CM | POA: Diagnosis not present

## 2018-05-03 DIAGNOSIS — Z87891 Personal history of nicotine dependence: Secondary | ICD-10-CM | POA: Diagnosis not present

## 2018-05-03 DIAGNOSIS — R0609 Other forms of dyspnea: Secondary | ICD-10-CM

## 2018-05-03 MED ORDER — FUROSEMIDE 20 MG PO TABS: 20.0000 mg | ORAL_TABLET | Freq: Every day | ORAL | 5 refills | Status: DC

## 2018-05-03 NOTE — Patient Instructions (Addendum)
Today we updated your med list in our EPIC system...    Continue your current medications the same...  We decided to add-in another diuretic LASIX (Furosemide)20mg  tabs- take one tab each AM to help w/ the edema in your feet...  REMEMBER:  You DON'T need salt on your watermelon!!!    Follow the low sodium diet we provided...  Call for any questions...  Let's plan a follow up visit in 6-8 weeks, sooner if needed for problems.Marland KitchenMarland Kitchen

## 2018-05-03 NOTE — Progress Notes (Addendum)
Subjective:     Patient ID: Larry Bautista, male   DOB: 26-Dec-1940, 77 y.o.   MRN: [id number]  HPI 77 y/o WM referred by Dr. Juanetta Beets, [facility], for a pulmonary evaluation due to intermittent wheezing and an abnormal spirometry test...   ~  I reviewed old EPIC records for pertinent DATA>> ~  CXR 04/05/14 showed norm heart size, tortuous Ao, clear lungs- NAD, DDD in Tspine...  ~  Spirometry 08/06/16 in DrFisher's office> FVC=2.20 (57%), FEV1=1.62 (59%), %1sec=74%, mid-flows reduced at 59% predicted; This is c/w a mod restrictive ventilatory defect w/ superimposed small airways dis.  ~  October 01, 2016:  Initial pulmonary consultation by SN>      [name] reports that he has been SOB & wheezing for ~81yr the symptoms are intermittent but seem worse supine per pt; his PCP has treated him for bronchitis w/ antibiotics and Pred w/ improvement but the symptoms return... He notes SOB/ DOE but states it's due to "lack of exercise"- ADLs are OK, stairs are a problem, carrying a load, etc; he says stable & no real change over the last yr... He's also noted a mild dry nagging cough & he is on an ACE for HBP and hx CAD...  Smoking Hx>  he is an ex-smoker, started ~15, smoked regularly 1ppd, transiently up to 2ppd, Quit 1991 at age [age]because "I didn't like being addicted"; total ~35 yrs smoking & ~40 pack-yr history...  Pulmonary Hx>  He denies hx lung problems until the last yr or so; denies hx asthma, pneumonia, TB or exposure, etc...  Medical Hx>  HBP, CAD w/ stent, AFib (followed by DrHSmith), HL, kidney stones/BPH, DJD/ LBP,   Family Hx>  Lung cancer in mother, heart dis & colon cancer in father, lung cancer & COPD in brother...  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  Current Meds>  No resp meds- on Coumadin, ToprolXL, Lisinopril, Eplerenone, Pravachol, Flomax, Neurontin    Immuniz status>  Epic  records indicate that he needs 2017 Flu vaccine & Pneumovax-23 shot...  EXAM shows Afeb, VSS, O2sat=95% on RA;  HEENT- neg, mallampati2;  Chest- clear x few basilar rhonchi, no consolidation;  Heart- Irreg AFib w/o m/r/g;  Abd- soft, nontender, neg;  Ext- neg w/o c/c/e;  Neuro- intact...   CXR 10/01/16> HE DID NOT GO TO XRAY FOR THE REQUESTED FILM..Marland KitchenMarland Kitchen Ambulatory Oximetry 10/01/16> O2sat=97% on RA at rest w/ pulse=68/min;  He walked 3 laps in the office (185'each) w/ lowest O2sat=94% w/ pulse=98/min...  LABS 10/01/16> HE DID NOT GO TO THE LAB FOR THE REQUESTED BLOOD WORK...  Full PFTs > scheduled and pending IMP  >>        DOE> likely multifactorial w/ components from lung/ heart/ deconditioning...      AbnSpirometry> it appears more restricted than obstructed, need to correlate to current imaging studies...      Ex-smoker> he quit in 1991 at age [age]w/ ~~34pack-yr smoking hx...      Cardiac issues> followed by DrHSmith-- HBP, CAD- s/p stent, AFib on coumadin...      Medical issues> followed by DrFisher-- HBP, HL, kidney stones/ BPH, DJD/ LBP PLAN >>   10/01/16:   We need some further assessment on [name]'s lungs w/ a current CXR, review blood work, and Full PFTs;  He does not desat w/ exercise;  The most important intervention at this point is to increase  his exercise program- [facility], silver sneakers, or similar group;  We are holding off on additional meds until we can see this data...   ~  November 06, 2016:  29moROV w/ SN>  [name] that he is about the same, but he is c/o more cough today, worse supine he thinks, does a lot of throat clearing;  He notes occas wheezing & reports that "allergies are really bad this yr" with runny nose & sneezing but "cough is from my meds";  He notes that he's been on the Lisinopril for >53yrnow & we wll check IgE & RASTs...    EXAM shows Afeb, VSS, O2sat=97% on RA;  HEENT- neg, mallampati2;  Chest- clear x min basilar rhonchi, no consolidation;  Heart- Irreg  AFib w/o m/r/g;  Abd- soft, nontender, neg;  Ext- neg w/o c/c/e;  Neuro- intact...   CXR 11/06/16>  Norm heart size, atherosclerotic Ao, sl pleural thickening bilat, min blunting left costophrenic angle...  Full PFTs 11/06/16>  FVC=2.45 (64%), FEV1=1.92 (70%), %1sec=78%; mid-flows are wnl at 85%; post bronchodil there was a sl response (FEV1 incr to 2.12 (77%) for a 10% improvement;  TLC=5.26 (79%), RV=2.63 (105%), RV/TLC=50&;  DLCO=75% predicted  This is c/w mild restriction, no real obstruction BUT a 10% improvement in FEV1 after the bronchodil), air trapping, & mild decr in DLCO noted...  LABS 11/06/16>  Chems- wnl w/ BS=98, Cr=0.80, LFTs wnl;  CBC- wnl w/ Hg=15.3;  TSH=0.47;  Sed=2;  IgE=13;  RAST- NEG x +grasses are borderline IMP >>         DOE> likely multifactorial w/ components from lung/ heart/ deconditioning...      AbnPFTs> it appears more restricted than obstructed, need to correlate to current imaging studies...      Ex-smoker> he quit in 1991 at age 77/ ~4~50ack-yr smoking hx...      Cardiac issues> followed by DrHSmith-- HBP, CAD- s/p stent, AFib on coumadin...      Medical issues> followed by DrFisher-- HBP, HL, kidney stones/ BPH, DJD/ LBP PLAN >>  10/01/16:   We need some further assessment on [name]'s lungs w/ a current CXR, review blood work, and Full PFTs;  He does not desat w/ exercise;  The most important intervention at this point is to increase his exercise program- eg at the Y, silver sneakers, or similar group;  We are holding off on additional meds until we can see this data...  11/06/16:   JeEmorias persistent DOE and a mild cough- again these are most likely multifactorial in etiology w/ components from pulm/ cardiac/ deconditioning as noted; he is also on an ACE & although he quit smoking in 1991, he has been way too sedentary w/o exercise program; Labs are all WNL x borderline RAST pos to grasses... REC to STOP Lisinopril, monitor BP at home to see if additional med needed or  not, deep breathing exercises... we plan rov recheck in 104m38mo~  February 09, 2017:  104mo36mo & pulmonary follow up visit>  [name] stopped the Lisinopril as directed after the last visit and now he indicates that he has a low BP ~80 in the AM, but improved to 110-120 range throughout the day;  He also notes sl chronic cough & throat clearing which could also be related to the ACE inhibitor;  He does note however that his SOB/DOE is sl better since he has started an exercise program- walking/ stairs/ etc...[name]  He saw DrSharma for Allergy 12/12/16>  Chr rhinorrhea, clear mucus, skin testing was pos for grass & weeds, food testing NEG;  They rec Zyrtek, Flonase, Atrovent nasal...    DOE is sl improved w/ starting an exercise program...    HBP on ToprolXL25, Lisin20 (he never stopped as prev instructed), Eplerenone25; BP= 114/66 & we discussed stopping the ACE inhib rx again, monitor BP at home & call for any questions...    CAD, Afib on Coumadin, ASA81 and followed by PCP-Dr.Don Fisher & CARDS- Dr.HSmith EXAM shows Afeb, VSS, O2sat=97% on RA;  HEENT- neg, mallampati2;  Chest- clear x min basilar rhonchi, no consolidation;  Heart- Irreg AFib w/o m/r/g;  Abd- soft, nontender, neg;  Ext- neg w/o c/c/e;  Neuro- intact... IMP/PLAN>>  Taytum is reminded to stop the Lisinopril completely & monitor his BP & symptoms going forward; hopefully his resp symptoms will resolve off the ACE + continued allergy rx per DrSharma;  He needs to maintain & push his exercise program...  ~  August 10, 2017:  53moROV & JGodricreturns noting that his breathing is stable to sl improved off the ACE inhib & improved exercise program although he is lim by arthritis pain knees & hx LBP w/ surg in past;  He notes mild cough, small amt tan mucus, no hemoptysis; he denies much SOB but notes some DOE w/ stairs etc; no CP/ palpit/ dizzy/ edema- recqll that he has AFib followed by DrHSmith for Cards... He rests well at night, denies snoring prob,  daytime sleepiness issues, etc...  We reviewed the following interval Epic notes>      He saw Urology-DrEskridge 76/25/18 for f/u BPH>  On Flomax0.4, PVR~100, last PSA was 09/2016= 2.10 (stable);  Rec to try cialis20 prn for ED...    [facility]  He met their goals for rehab & was discharged 03/26/17 after 13 treatments...     He is followed in the [facility] regularly> most recently 07/29/17 on Coumadin 535mtabs- taking 1/2 tab daily x7d w/ INR= 2.2, continue same... We reviewed the following medical problems during today's office visit>     DOE> felt to be multifactorial w/ components from lung/ heart/ deconditioning- he notes more limited by athritis in knees & LBP w/ hx surg...    AR> followed by DrSharma (eval 12/2016) & allergic to grasses & weeds on testing; Rx w/ Zyrtek, Flonase, Atrovent nasal; allergy shots were offered but not started.    AbnPFTs> he appears more restricted than obstructed based on his PFT, CXR shows basically clear, some mild incr bilat pleural stripe noted...    Ex-smoker> he quit in 1991 at age 77/ ~4~59ack-yr smoking hx...    Cardiac issues> followed by DrHSmith-- HBP, CAD- s/p stent, AFib on coumadin...    Medical issues> followed by DrFisher-- HBP, HL, kidney stones/ BPH, DJD/ LBP (DrNudelman is weaning his Neurontin); we stopped his ACE rx & he is improved...  EXAM shows Afeb, VSS, O2sat=98% on RA;  HEENT- neg, mallampati2;  Chest- clear x min basilar rhonchi, no consolidation;  Heart- Irreg AFib w/o m/r/g;  Abd- soft, nontender, neg;  Ext- neg w/o c/c/e;  Neuro- intact... IMP/PLAN>>  JeMarlyns stable overall, and breathing sl better off ACE & on a mild exercise program; so far he has NOT required breathing meds/ inhalers- improved w/ his allergy meds per DrSharma- Zyrtek10, Flonase/ Atrovent nasal; we will continue to monitor...   ~  December 08, 2017:  61moROV & add-on appt requested for  "bronchitis" noting a 1wk hx cough, tan colored sput, no hemoptysis, plus low grade fever, w/o c/s;  He's noted incr SOB, but no CP/ palpit/ edema... Exam shows insp/exp wheezing & bibasilar rhonchi + chest congestion => c/w asthmatic bronchitic exacerbation... NOTE> [facility] & didn't mention any resp symptoms...     DOE> felt to be multifactorial w/ components from lung/ heart/ deconditioning- he notes more limited by athritis in knees & LBP w/ hx surg...    AR> followed by DrSharma (eval 12/2016) & allergic to grasses & weeds on testing; Rx w/ Zyrtek, Flonase, Atrovent nasal; allergy shots were offered but not started; we did RAST tests 3/18 w/ IgE=13 & RAST pos to grasses only.    AbnPFTs> he appeared more restricted than obstructed based on his PFT, CXR shows basically clear, some mild incr bilat pleural stripe noted...    Ex-smoker> he quit in 1991 at age [age]w/ ~~3pack-yr smoking hx... EXAM shows Afeb, VSS, O2sat=96% on RA;  HEENT- neg, mallampati2;  Chest- insp/exp wheezing & bibasilar rhonchi w/ chest congestion, no consolidation;  Heart- Irreg AFib w/o m/r/g;  Abd- soft, nontender, neg;  Ext- neg w/o c/c/e.   CXR 12/08/17 (independently reviewed by me in the PACS system) showed norm heart size, atherosclerotic changes in Ao, clear lungs w/ bilat pleurl thickening & min scarring left base- NAD...   LABS 12/08/17>  Chems- wnl x AST=40;  CBC- wnl w/ wbc=8.6;  Sed=32 IMP/PLAN>>  JRodrecushas acute bronchitis w/ an AB exac=> we discussed Rx w/ Levaquin & a tapering course of PRED; he will also benefit from ProairHFA for prn use & the addition of MCINEX as a mucolytic... We plan an rov recheck in 3-4 weeks to assess the need for any long acting inhalers...  ~  December 29, 2017:  3wk ROV & pulm recheck>  When seen 4/2 JAmdrewhad an acute AB exac precipitated by a bronchitic infection w/ wheezing, rhonchi, chest congestion;  His CXR showed chr  changes but NAD & his Labs were ok x Sed=32;  We treated him w/ Levaquin, Pred course, Mucinex & fluids;  Asked to ret to assess response & decide whether ICS/LABA inhaler indicated...  He reports a great response to out therapy, estimates 90+% better now w/ min wheezing in the early AM if he lies on his left side, but otherw clear, no dyspnea, cough, sput, etc... He has finished the Levaquin, weaned the Pred to 133mQod at this time & doesn't feel he needs the Mucinex anymore;  He has Albuterol-HFA for prn use & doesn't want any additional inhalers unless absolutely needed...     EXAM shows Afeb, VSS, O2sat=97% on RA;  HEENT- neg, mallampati2;  Chest- clear now w/o w/r/r;  Heart- Irreg AFib w/o m/r/g;  Abd- soft, nontender, neg;  Ext- neg w/o c/c/e.  IMP/PLAN>>  JeDerions an ex-smoker w/ AB, AR, multifactorial dyspnea;  Asked to finish the Pred1029mod til gone, & use the albutHFA inhaler as needed; we reviewed need for regular exercise, we plan rov recheck in 3-50mo56mo   ~  May 03, 2018:  50mo 42mo& JerryKariemhad a very eventful interval since his last visit w/ me on 12/29/17> he was ADM 01/07/18 w/ an acute GIB having black stools for several days followed by n/v/ hemetemesis (he was on coumadin for  Afib);  Diagnosed w/ Esoph AdenoCa- treated by DrSherrill w/ chemoradiation (weekly taxol/ carboplatin & XRT completed 03/17/18; he then saw DrGerhardt for Cardiothoracic surg- pt wants to avoid surg and his cancer team continues to follow closely>    Adenocarcinoma of the GE junction: ? Partially obstructing mass noted at the GE junction on endoscopy 01/07/2018, nonobstructing ? CTs 01/14/2018, sub-solid 5 mm right upper lobe nodule, mild eccentric wall thickening at the GE junction ? PET scan 01/27/2018-hypermetabolism at the GE junction, no evidence of metastatic disease ? Initiation of radiation and weekly Taxol/carboplatin (DrSherrill) 02/03/2018 ? Week 2 Taxol/carboplatin 02/10/2018 ? Week 3 Taxol/carboplatin  02/17/2018 ? Week 4 Taxol/carboplatin 02/24/2018 ? Week 5 Taxol/carboplatin 03/03/2018 ? Radiation completed (DrMoody) 03/17/2018 Staging eval w/ 28m RUL nodule and PET neg for any obvious mets;  He has been rechecked by CARDS- DrHSmith and restarted on his Coumadin for AFib w/ CHADS Vasc >3... JCynthianotes that his breathing has improved, denies wheezing, no sput, no hemoptysis, but still feels weak w/ SOB on stairs (but ADLs and walking on level ground is OK);  He remains on ZTyroas needed; also ToprolXL25 & Eplerenone25... EXAM shows Afeb, VSS, O2sat=98% on RA;  Wt= 180# (down 9#);  HEENT- neg, mallampati2;  Chest- decr BS at bases w/ bilat rales, no wheezing/ rhonchi/ or consolidation;  Heart- Irreg AFib w/o m/r/g;  Abd- soft, nontender, neg;  Ext- no c/c but he has 2+edema...  Interval LABS>  Reviewed-- last K=3.7 and Cr=0.78 IMP/PLAN>>  Diagnosed w/ Espoh Ca- treated w/ chemoradiation & improved;  On reviewed JKonstantinoshad lost 20# but gained back 11# w/ boost & improved appetite, but he has 2+ edema today=> rec adding LASIX20, restrict sodium, support hose, etc... We plan recheck in 6-8 weeks.    Past Medical History:  Diagnosis Date  . Allergy   . CHF (congestive heart failure) ([facility]   . Chronic knee pain   . Coronary artery disease    with LAD DE stent 2004  . Heel spur, left   . Hyperlipidemia   . Hypertension   . Kidney stones   . Lower back pain    status post surgery for spondylolisthesis  . Migraine   . Persistent atrial fibrillation ([place]    longstanding persistent (since 05/2008)  . Plantar fasciitis     Past Surgical History:  Procedure Laterality Date  . BACK SURGERY     spondylolisthesis  . CARDIAC CATHETERIZATION  03/06/2003  . CORONARY ANGIOPLASTY WITH STENT PLACEMENT  04/2003   CYPHER stent implantation in the LAD  . ESOPHAGOGASTRODUODENOSCOPY (EGD) WITH PROPOFOL N/A 01/07/2018   Procedure: ESOPHAGOGASTRODUODENOSCOPY (EGD) WITH PROPOFOL;   Surgeon: GWonda Horner MD;  [facility];  Service: Endoscopy;  Laterality: N/A;  . HAND SURGERY Right 2014   carpal tunnel  . SKIN CANCER EXCISION  1980's   Face  . VASCULAR SURGERY     Vascular Stent    Outpatient Encounter Medications as of 05/03/2018  Medication Sig  . albuterol (PROAIR HFA) 108 (90 Base) MCG/ACT inhaler Inhale 1-2 puffs into the lungs every 6 (six) hours as needed for wheezing or shortness of breath.  . cetirizine (ZYRTEC) 10 MG tablet Take 10 mg by mouth daily.  . diphenhydramine-acetaminophen (TYLENOL PM) 25-500 MG TABS Take 1 tablet by mouth at bedtime as needed (for sleep).   .Marland Kitcheneplerenone (INSPRA) 25 MG tablet Take 1 tablet (25 mg total) by mouth daily. Please call [phone] schedule appointment  for additional refills thanks. (2nd attempt)  . fluticasone (FLONASE) 50 MCG/ACT nasal spray USE 2 SPRAYS IN EACH NOSTRIL DAILY  . gabapentin (NEURONTIN) 300 MG capsule Take 300 mg by mouth every other day.   . metoprolol succinate (TOPROL-XL) 25 MG 24 hr tablet Take 1 tablet (25 mg total) by mouth daily. Please call [phone] to schedule appointment for additional refills thanks. (2nd attempt)  . nitroGLYCERIN (NITROSTAT) 0.4 MG SL tablet Place 0.4 mg under the tongue every 5 (five) minutes as needed.  . pantoprazole (PROTONIX) 40 MG tablet TAKE ONE (1) TABLET EACH DAY  . psyllium (METAMUCIL) 58.6 % powder Take 1 packet by mouth daily.   . tamsulosin (FLOMAX) 0.4 MG CAPS capsule Take 0.4 mg by mouth daily after supper.  . [DISCONTINUED] HYDROcodone-acetaminophen (NORCO) 5-325 MG tablet Take 1 tablet by mouth every 4 (four) hours as needed for moderate pain.  . [DISCONTINUED] ondansetron (ZOFRAN ODT) 8 MG disintegrating tablet Take 1 tablet (8 mg total) by mouth every 8 (eight) hours as needed for nausea or vomiting.  . [DISCONTINUED] prochlorperazine (COMPAZINE) 5 MG tablet Take 1 tablet (5 mg total) by mouth every 6 (six) hours as needed for nausea or  vomiting.  . [DISCONTINUED] triamcinolone lotion (KENALOG) 0.1 % Apply 1 application topically 3 (three) times daily.  . furosemide (LASIX) 20 MG tablet Take 1 tablet (20 mg total) by mouth daily.  [name] warfarin (COUMADIN) 2.5 MG tablet Take 2.5 mg by mouth daily.  [name] warfarin (COUMADIN) 5 MG tablet TAKE ONE TABLET DAILY AS DIRECTED (Patient not taking: Reported on 05/03/2018)   No facility-administered encounter medications on file as of 05/03/2018.     Allergies  Allergen Reactions  . Celecoxib Rash  . Lisinopril Cough  . Simvastatin Other (See Comments)    Leg pain    Immunization History  Administered Date(s) Administered  . Influenza, High Dose Seasonal PF 05/01/2016, 04/08/2017  . Influenza-Unspecified 06/04/2015  . Pneumococcal Conjugate-13 03/28/2015  . Tdap 05/17/2012  . Zoster 11/04/2005    Current Medications, Allergies, Past Medical History, Past Surgical History, Family History, and Social History were reviewed in [employer] record.   Review of Systems             All symptoms NEG except where BOLDED >>  Constitutional:  F/C/S, fatigue, anorexia, unexpected weight change. HEENT:  HA, visual changes, hearing loss, earache, nasal symptoms, sore throat, mouth sores, hoarseness. Resp:  cough, sputum, hemoptysis; SOB, tightness, wheezing. Cardio:  CP, palpit, DOE, orthopnea, edema. GI:  N/V/D/C, blood in stool; reflux, abd pain, distention, gas. GU:  dysuria, freq, urgency, hematuria, flank pain, voiding difficulty. MS:  joint pain, swelling, tenderness, decr ROM; neck pain, back pain, etc. Neuro:  HA, tremors, seizures, dizziness, syncope, weakness, numbness, gait abn. Skin:  suspicious lesions or skin rash. Heme:  adenopathy, bruising, bleeding. Psyche:  confusion, agitation, sleep disturbance, hallucinations, anxiety, depression suicidal.   Objective:   Physical Exam         Vital Signs:  Reviewed...   General:  WD, WN, 77 y/o WM in NAD;  alert & oriented; pleasant & cooperative... HEENT:  [place]/AT; Conjunctiva- pink, Sclera- nonicteric, EOM-wnl, PERRLA, EACs-clear, TMs-wnl; NOSE-clear; THROAT-clear & wnl.  Neck:  Supple w/ fair ROM; no JVD; normal carotid impulses w/o bruits; no thyromegaly or nodules palpated; no lymphadenopathy.  Chest:  Clear to P & A; without wheezes, rales, or rhonchi heard. Heart:  Irregular rhythm (AFIB); norm S1 & S2 without murmurs, rubs, or  gallops detected. Abdomen:  Soft & nontender- no guarding or rebound; normal bowel sounds; no organomegaly or masses palpated. Ext:  Decr ROM; without deformities +arthritic changes; no varicose veins, +venous insuffic, tr edema;  Pulses intact w/o bruits. Neuro:  CNs II-XII intact; motor testing normal; sensory testing normal; gait normal & balance OK. Derm:  No lesions noted; no rash etc. Lymph:  No cervical, supraclavicular, axillary, or inguinal adenopathy palpated.   Assessment:      IMP>>        DOE> likely multifactorial w/ components from lung/ heart/ deconditioning...      AR, AB> stable on AlbutHFA as needed + Flonase/ Zyrtek...       AbnSpirometry> it appears more restricted than obstructed, need to correlate to current imaging studies...      Ex-smoker> he quit in 1991 at age 25 w/ ~26 pack-yr smoking hx...      Cardiac issues> followed by DrHSmith-- HBP, CAD- s/p stent, AFib on coumadin...      Medical issues> followed by DrFisher-- HBP, HL, kidney stones/ BPH, DJD/ LBP  PLAN>>       We need some further assessment on Urijah's lungs w/ a current CXR, review blood work, and Full PFTs;  He does not desat w/ exercise;  The most important intervention at this point is to increase his exercise program- [facility], silver sneakers, or similar group;  We are holding off on additional meds until we can see this data...  11/06/16>   Jevaughn has persistent DOE and a mild cough- again these are most likely multifactorial in etiology w/ components from pulm/ cardiac/  deconditioning as noted; he is also on an ACE & although he quit smoking in 1991, he has been way too sedentary w/o exercise program; Labs are all WNL x borderline RAST pos to grasses... REC to STOP Lisinopril, monitor BP at home to see if additional med needed or not, deep breathing exercises... we plan rov recheck in 71mo 02/09/17>   JJonahtanis reminded to stop the Lisinopril completely & monitor his BP & symptoms going forward; hopefully his resp symptoms will resolve off the ACE + continued allergy rx per DrSharma;  He needs to maintain & push his exercise program. 12/18/17>   JAarohas acute bronchitis w/ an AB exac=> we discussed Rx w/ Levaquin & a tapering course of PRED; he will also benefit from ProairHFA for prn use & the addition of MCINEX as a mucolytic... We plan an rov recheck in 3-4 weeks to assess the need for any long acting inhalers. 12/29/17>   JHaroldis an ex-smoker w/ AB, AR, multifactorial dyspnea;  Asked to finish the Pred170mQod til gone, & use the albutHFA inhaler as needed; we reviewed need for regular exercise, we plan rov recheck in 3-79m37mo/26/19>   Diagnosed w/ Espoh Ca- treated w/ chemoradiation & improved;  On reviewed JerFadyd lost 20# but gained back 11# w/ boost & improved appetite, but he has 2+ edema today=> rec adding LASIX20, restrict sodium, support hose, etc... We plan recheck in 6-8 weeks   Plan:     Patient's Medications  New Prescriptions   FUROSEMIDE (LASIX) 20 MG TABLET    Take 1 tablet (20 mg total) by mouth daily.  Previous Medications   ALBUTEROL (PROAIR HFA) 108 (90 BASE) MCG/ACT INHALER    Inhale 1-2 puffs into the lungs every 6 (six) hours as needed for wheezing or shortness of breath.   CETIRIZINE (ZYRTEC) 10 MG  TABLET    Take 10 mg by mouth daily.   DIPHENHYDRAMINE-ACETAMINOPHEN (TYLENOL PM) 25-500 MG TABS    Take 1 tablet by mouth at bedtime as needed (for sleep).    EPLERENONE (INSPRA) 25 MG TABLET    Take 1 tablet (25 mg total) by mouth daily.  Please call [phone] to schedule appointment for additional refills thanks. (2nd attempt)   FLUTICASONE (FLONASE) 50 MCG/ACT NASAL SPRAY    USE 2 SPRAYS IN EACH NOSTRIL DAILY   GABAPENTIN (NEURONTIN) 300 MG CAPSULE    Take 300 mg by mouth every other day.    METOPROLOL SUCCINATE (TOPROL-XL) 25 MG 24 HR TABLET    Take 1 tablet (25 mg total) by mouth daily. Please call [phone] to schedule appointment for additional refills thanks. (2nd attempt)   NITROGLYCERIN (NITROSTAT) 0.4 MG SL TABLET    Place 0.4 mg under the tongue every 5 (five) minutes as needed.   PANTOPRAZOLE (PROTONIX) 40 MG TABLET    TAKE ONE (1) TABLET EACH DAY   PSYLLIUM (METAMUCIL) 58.6 % POWDER    Take 1 packet by mouth daily.    TAMSULOSIN (FLOMAX) 0.4 MG CAPS CAPSULE    Take 0.4 mg by mouth daily after supper.   WARFARIN (COUMADIN) 2.5 MG TABLET    Take 2.5 mg by mouth daily.   WARFARIN (COUMADIN) 5 MG TABLET    TAKE ONE TABLET DAILY AS DIRECTED  Modified Medications   No medications on file  Discontinued Medications   HYDROCODONE-ACETAMINOPHEN (NORCO) 5-325 MG TABLET    Take 1 tablet by mouth every 4 (four) hours as needed for moderate pain.   ONDANSETRON (ZOFRAN ODT) 8 MG DISINTEGRATING TABLET    Take 1 tablet (8 mg total) by mouth every 8 (eight) hours as needed for nausea or vomiting.   PROCHLORPERAZINE (COMPAZINE) 5 MG TABLET    Take 1 tablet (5 mg total) by mouth every 6 (six) hours as needed for nausea or vomiting.   TRIAMCINOLONE LOTION (KENALOG) 0.1 %    Apply 1 application topically 3 (three) times daily.

## 2018-05-04 DIAGNOSIS — Z23 Encounter for immunization: Secondary | ICD-10-CM | POA: Diagnosis not present

## 2018-05-05 ENCOUNTER — Other Ambulatory Visit: Payer: Self-pay

## 2018-05-05 ENCOUNTER — Ambulatory Visit
Admission: RE | Admit: 2018-05-05 | Discharge: 2018-05-05 | Disposition: A | Discharge status: Home / Self Care | Payer: Medicare Other | Source: Ambulatory Visit | Attending: Radiation Oncology | Admitting: Radiation Oncology

## 2018-05-05 ENCOUNTER — Encounter: Payer: Self-pay | Admitting: Radiation Oncology

## 2018-05-05 VITALS — BP 127/79 | HR 57 | Temp 97.6°F | Resp 20 | Ht 67.0 in | Wt 179.6 lb

## 2018-05-05 DIAGNOSIS — Z888 Allergy status to other drugs, medicaments and biological substances status: Secondary | ICD-10-CM | POA: Insufficient documentation

## 2018-05-05 DIAGNOSIS — Z79899 Other long term (current) drug therapy: Secondary | ICD-10-CM | POA: Insufficient documentation

## 2018-05-05 DIAGNOSIS — Z7901 Long term (current) use of anticoagulants: Secondary | ICD-10-CM | POA: Diagnosis not present

## 2018-05-05 DIAGNOSIS — C160 Malignant neoplasm of cardia: Secondary | ICD-10-CM | POA: Diagnosis not present

## 2018-05-05 NOTE — Progress Notes (Signed)
Radiation Oncology         [phone]) [phone] ________________________________  Name: Larry Bautista MRN: [id number]  Date of Service: 05/05/2018 DOB: 08-May-1941  Post Treatment Note  CC: [name], MD  Ladell Pier, MD  Diagnosis:   Adenocarcinoma of the GE junction  Interval Since Last Radiation:  7 weeks   02/03/2018 - 03/17/2018:  The distal esophagus/GE junction was treated to 45Gy in 33fractions initially. The patient then received a 9 Gy boost delivered in 5 fractions for a total dose of 54 Gy.  Narrative:  The patient returns today for routine follow-up. He did have trouble with rash that was noted in the midst of radiotherapy that involved his trunk and upper extremities and medial thighs. He reports resolution of this as well as the esophagitis that was present during treatment.                             On review of systems, the patient states he is doing better and has gained back 10 of the 20 pounds he lost. He was given a prescription for lasix due to lower extremity ankle edema. He is unsure if he should take this.   ALLERGIES:  is allergic to celecoxib; lisinopril; and simvastatin.  Meds: Current Outpatient Medications  Medication Sig Dispense Refill  . albuterol (PROAIR HFA) 108 (90 Base) MCG/ACT inhaler Inhale 1-2 puffs into the lungs every 6 (six) hours as needed for wheezing or shortness of breath. 1 Inhaler prn  . cetirizine (ZYRTEC) 10 MG tablet Take 10 mg by mouth daily.    . diphenhydramine-acetaminophen (TYLENOL PM) 25-500 MG TABS Take 1 tablet by mouth at bedtime as needed (for sleep).     [name] eplerenone (INSPRA) 25 MG tablet Take 1 tablet (25 mg total) by mouth daily. Please call [phone] to schedule appointment for additional refills thanks. (2nd attempt) 30 tablet 0  . fluticasone (FLONASE) 50 MCG/ACT nasal spray USE 2 SPRAYS IN EACH NOSTRIL DAILY 48 g 3  . gabapentin (NEURONTIN) 300 MG capsule Take 300 mg by mouth every other day.     .  metoprolol succinate (TOPROL-XL) 25 MG 24 hr tablet Take 1 tablet (25 mg total) by mouth daily. Please call [phone] to schedule appointment for additional refills thanks. (2nd attempt) 30 tablet 0  . pantoprazole (PROTONIX) 40 MG tablet TAKE ONE (1) TABLET EACH DAY 90 tablet 0  . psyllium (METAMUCIL) 58.6 % powder Take 1 packet by mouth daily.     . tamsulosin (FLOMAX) 0.4 MG CAPS capsule Take 0.4 mg by mouth daily after supper.    . furosemide (LASIX) 20 MG tablet Take 1 tablet (20 mg total) by mouth daily. (Patient not taking: Reported on 05/05/2018) 30 tablet 5  . nitroGLYCERIN (NITROSTAT) 0.4 MG SL tablet Place 0.4 mg under the tongue every 5 (five) minutes as needed.    . warfarin (COUMADIN) 2.5 MG tablet Take 2.5 mg by mouth daily.    [name] warfarin (COUMADIN) 5 MG tablet TAKE ONE TABLET DAILY AS DIRECTED (Patient not taking: Reported on 05/03/2018) 90 tablet 0   No current facility-administered medications for this encounter.     Physical Findings:  height is 5\' 7"  (1.702 m) and weight is 179 lb 9.6 oz (81.5 kg). His oral temperature is 97.6 F (36.4 C). His blood pressure is 127/79 and his pulse is 57 (abnormal). His respiration is 20 and oxygen saturation is 98%.  Pain Assessment Pain Score: 0-No pain/10 In general this is a well appearing caucasian male in no acute distress. He's alert and oriented x4 and appropriate throughout the examination. Cardiopulmonary assessment is negative for acute distress and he exhibits normal effort. No visible rash is noted of the upper extremities.  Lab Findings: Lab Results  Component Value Date   WBC 4.9 03/25/2018   HGB 13.1 03/25/2018   HCT 39.2 03/25/2018   MCV 94.4 03/25/2018   PLT 140 03/25/2018     Radiographic Findings: No results found.  Impression/Plan: 1. Adenocarcinoma of the GE junction. The patient will be due for repeat CT imaging and will follow up this as well as with Dr. Servando Snare tomorrow. We will follow along with his  progress. Otherwise we will see him as needed and stressed the importance of surveillance evaluations which will include endoscopy.      Carola Rhine, PAC

## 2018-05-06 ENCOUNTER — Ambulatory Visit (INDEPENDENT_AMBULATORY_CARE_PROVIDER_SITE_OTHER): Payer: Medicare Other | Admitting: Cardiothoracic Surgery

## 2018-05-06 ENCOUNTER — Ambulatory Visit
Admission: RE | Admit: 2018-05-06 | Discharge: 2018-05-06 | Disposition: A | Discharge status: Home / Self Care | Payer: Medicare Other | Source: Ambulatory Visit | Attending: Cardiothoracic Surgery | Admitting: Cardiothoracic Surgery

## 2018-05-06 ENCOUNTER — Other Ambulatory Visit: Payer: Self-pay

## 2018-05-06 ENCOUNTER — Encounter: Payer: Self-pay | Admitting: Cardiothoracic Surgery

## 2018-05-06 VITALS — BP 126/78 | HR 77 | Resp 16 | Ht 67.0 in | Wt 179.0 lb

## 2018-05-06 DIAGNOSIS — Z9221 Personal history of antineoplastic chemotherapy: Secondary | ICD-10-CM

## 2018-05-06 DIAGNOSIS — C159 Malignant neoplasm of esophagus, unspecified: Secondary | ICD-10-CM | POA: Diagnosis not present

## 2018-05-06 DIAGNOSIS — D490 Neoplasm of unspecified behavior of digestive system: Secondary | ICD-10-CM

## 2018-05-06 DIAGNOSIS — Z923 Personal history of irradiation: Secondary | ICD-10-CM | POA: Diagnosis not present

## 2018-05-06 DIAGNOSIS — C155 Malignant neoplasm of lower third of esophagus: Secondary | ICD-10-CM

## 2018-05-06 DIAGNOSIS — J9811 Atelectasis: Secondary | ICD-10-CM | POA: Diagnosis not present

## 2018-05-06 DIAGNOSIS — I2510 Atherosclerotic heart disease of native coronary artery without angina pectoris: Secondary | ICD-10-CM | POA: Diagnosis not present

## 2018-05-06 DIAGNOSIS — I480 Paroxysmal atrial fibrillation: Secondary | ICD-10-CM | POA: Diagnosis not present

## 2018-05-06 MED ORDER — IOPAMIDOL (ISOVUE-300) INJECTION 61%
100.0000 mL | Freq: Once | INTRAVENOUS | Status: AC | PRN
  Administered 2018-05-06: 100 mL via INTRAVENOUS

## 2018-05-06 NOTE — Telephone Encounter (Signed)
Belva Crome, MD  Loren Racer, LPN        He should resume coumadin.    Will route to anticoag team to schedule pt to restart Coumadin.   Called pt to update him.  Left message to call back.

## 2018-05-06 NOTE — Progress Notes (Signed)
[address]       [place] [address]             [phone]                    Larry Bautista [place] Medical Record #[id number] Date of Birth: [birth year]  Referring: Ladell Pier, MD Primary Care: [name], MD Primary Cardiologist: No primary care provider on file.  Chief Complaint:    Chief Complaint  Patient presents with  . Esophageal Cancer    3 wk f/u with restaging CT C/A/P    History of Present Illness:    Larry Bautista 77 y.o. male is seen in the office  today for follow-up of his distal esophageal adenocarcinoma.    The patient has had no previous history of esophageal problems or GI bleed.  He noted several months of increasing weakness and fatigue.  He denied any weight loss.  He noted increasing episodes of wheezing along with the fatigue and thought his underlying COPD was worse.  3 days prior to admission he developed black stools.  He then on the day of admission became very weak nauseated and vomited vomited bright red blood.   The patient had been on Coumadin for 4 to 5 years for chronic atrial fibrillation after admission his Coumadin was reversed endoscopy showed a distal esophageal mass pathology showed adenocarcinoma CT scan of the chest and abdomen was performed.  The patient was given 2 units of packed red blood cells.  He was then discharged from the hospital on aspirin and Coumadin and told to go and make an appointment at the cancer center and in the thoracic surgery office.     He has been treated with Initiation of radiation and weekly Taxol/carboplatin 02/03/2018 Week 2 Taxol/carboplatin 02/10/2018 Week 3 Taxol/carboplatin 02/17/2018 Week 4 Taxol/carboplatin 02/24/2018 Week 5 Taxol/carboplatin 03/03/2018 Radiation completed 03/17/2018  Patient is able to take a p.o. diet carefully.  He notes that he remains weak but is slowly gaining strength.  He is noted no further GI bleeding  Patient comes to the office  today referred further discuss options of surgical resection.  He notes that he still feels weak from his previous treatments    Current Activity/ Functional Status:  Patient is independent with mobility/ambulation, transfers, ADL's, IADL's.   Zubrod Score: At the time of surgery this patient's most appropriate activity status/level should be described as: []     0    Normal activity, no symptoms []     1    Restricted in physical strenuous activity but ambulatory, able to do out light work [x]     2    Ambulatory and capable of self care, unable to do work activities, up and about               >50 % of waking hours                              []     3    Only limited self care, in bed greater than 50% of waking hours []     4    Completely disabled, no self care, confined to bed or chair []     5    Moribund   Past Medical History:  Diagnosis Date  . Allergy   . CHF (congestive heart failure) ([place])   . Chronic knee pain   .  Coronary artery disease    with LAD DE stent 2004  . Heel spur, left   . Hyperlipidemia   . Hypertension   . Kidney stones   . Lower back pain    status post surgery for spondylolisthesis  . Migraine   . Persistent atrial fibrillation ([place])    longstanding persistent (since 05/2008)  . Plantar fasciitis     Past Surgical History:  Procedure Laterality Date  . BACK SURGERY     spondylolisthesis  . CARDIAC CATHETERIZATION  03/06/2003  . CORONARY ANGIOPLASTY WITH STENT PLACEMENT  04/2003   CYPHER stent implantation in the LAD  . ESOPHAGOGASTRODUODENOSCOPY (EGD) WITH PROPOFOL N/A 01/07/2018   Procedure: ESOPHAGOGASTRODUODENOSCOPY (EGD) WITH PROPOFOL;  Surgeon: Wonda Horner, MD;  Location: [facility] ENDOSCOPY;  Service: Endoscopy;  Laterality: N/A;  . HAND SURGERY Right 2014   carpal tunnel  . SKIN CANCER EXCISION  1980's   Face  . VASCULAR SURGERY     Vascular Stent    Family History  Problem Relation Age of Onset  . Heart attack Father   . Colon cancer  Father   . Congestive Heart Failure Father   . Atrial fibrillation Brother   . Stroke Brother   . Bladder Cancer Brother   . Lung cancer Mother   . Atrial fibrillation Brother   . COPD Brother   . Congestive Heart Failure Brother   . Lung cancer Brother   . Migraines Daughter    Family history significant for both his brother and mother who died of lung cancer, father died of colon cancer one sister had a neurological degenerative disease he has one daughter and one stepdaughter  Social History   Tobacco Use  Smoking Status Former Research scientist (life sciences)  . Packs/day: 2.00  . Years: 35.00  . Pack years: 70.00  . Types: Cigarettes  . Start date: 09/08/1954  . Last attempt to quit: 11/06/1989  . Years since quitting: 28.5  Smokeless Tobacco Never Used  Tobacco Comment   Smoked from [phone] (35 years)    Social History   Substance and Sexual Activity  Alcohol Use Not Currently  . Alcohol/week: 2.0 standard drinks  . Types: 2 Standard drinks or equivalent per week   Comment: 1961-2009 (Last drink 2009)      Allergies  Allergen Reactions  . Celecoxib Rash  . Lisinopril Cough  . Simvastatin Other (See Comments)    Leg pain    Current Outpatient Medications  Medication Sig Dispense Refill  . albuterol (PROAIR HFA) 108 (90 Base) MCG/ACT inhaler Inhale 1-2 puffs into the lungs every 6 (six) hours as needed for wheezing or shortness of breath. 1 Inhaler prn  . cetirizine (ZYRTEC) 10 MG tablet Take 10 mg by mouth daily.    . diphenhydramine-acetaminophen (TYLENOL PM) 25-500 MG TABS Take 1 tablet by mouth at bedtime as needed (for sleep).     [name] eplerenone (INSPRA) 25 MG tablet Take 1 tablet (25 mg total) by mouth daily. Please call [phone] to schedule appointment for additional refills thanks. (2nd attempt) 30 tablet 0  . fluticasone (FLONASE) 50 MCG/ACT nasal spray USE 2 SPRAYS IN EACH NOSTRIL DAILY 48 g 3  . gabapentin (NEURONTIN) 300 MG capsule Take 300 mg by mouth every other day.       . metoprolol succinate (TOPROL-XL) 25 MG 24 hr tablet Take 1 tablet (25 mg total) by mouth daily. Please call [phone] to schedule appointment for additional refills thanks. (2nd attempt) 30 tablet 0  . nitroGLYCERIN (  NITROSTAT) 0.4 MG SL tablet Place 0.4 mg under the tongue every 5 (five) minutes as needed.    . pantoprazole (PROTONIX) 40 MG tablet TAKE ONE (1) TABLET EACH DAY 90 tablet 0  . psyllium (METAMUCIL) 58.6 % powder Take 1 packet by mouth daily.     . tamsulosin (FLOMAX) 0.4 MG CAPS capsule Take 0.4 mg by mouth daily after supper.    . furosemide (LASIX) 20 MG tablet Take 1 tablet (20 mg total) by mouth daily. (Patient not taking: Reported on 05/05/2018) 30 tablet 5  . warfarin (COUMADIN) 2.5 MG tablet Take 2.5 mg by mouth daily.    [name] warfarin (COUMADIN) 5 MG tablet TAKE ONE TABLET DAILY AS DIRECTED (Patient not taking: Reported on 05/03/2018) 90 tablet 0   No current facility-administered medications for this visit.     Pertinent items are noted in HPI.   Review of Systems:     Cardiac Review of Systems: [Y] = yes  or   [ N ] = no   Chest Pain [  n]  Resting SOB Blue.Reese  ] Exertional SOB  Blue.Reese ]  Orthopnea Florencio.Farrier  ]   Pedal Edema [ y  ]    Palpitations [n] Syncope  [n ]   Presyncope [ y]   General Review of Systems: [Y] = yes [  ]=no Constitional: recent weight change [ y ];  Wt loss over the last 3 months [   ] anorexia [  ]; fatigue Blue.Reese  ]; nausea [  ]; night sweats [  ]; fever [  ]; or chills [  ];           Eye : blurred vision [  ]; diplopia [   ]; vision changes [  ];  Amaurosis fugax[  ]; Resp: cough [  ];  wheezing[  ];  hemoptysis[  ]; shortness of breath[  ]; paroxysmal nocturnal dyspnea[  ]; dyspnea on exertion[  ]; or orthopnea[  ];  GI:  gallstones[  ], vomiting[y  ];  dysphagia[  ]; melena[ y ];  hematochezia Blue.Reese  ]; heartburn[  ];   Hx of  Colonoscopy[  ]; GU: kidney stones [  ]; hematuria[  ];   dysuria [  ];  nocturia[  ];  history of     obstruction [  ]; urinary  frequency [ y ]             Skin: rash, swelling[  ];, hair loss[  ];  peripheral edema[  ];  or itching[  ]; Musculosketetal: myalgias[  ];  joint swelling[  ];  joint erythema[  ];  joint pain[ y ];  back pain[ y ];  Heme/Lymph: bruising[ y ];  bleeding[  ];  anemia[  ];  Neuro: TIA[  ];  headaches[  ];  stroke[  ];  vertigo[  ];  seizures[  ];   paresthesias[  ];  difficulty walking[  ];  Psych:depression[  ]; anxiety[  ];  Endocrine: diabetes[  ];  thyroid dysfunction[  ];  Immunizations: Flu up to date Blue.Reese  ]; Pneumococcal up to date [ y ];  Other:    PHYSICAL EXAMINATION: BP 126/78 (BP Location: Left Arm, Patient Position: Sitting, Cuff Size: Normal) Comment (Cuff Size): MANUALLY  Pulse 77   Resp 16   Ht 5\' 7"  (1.702 m)   Wt 179 lb (81.2 kg)   SpO2 94% Comment: ON RA  BMI 28.04 kg/m  General appearance: alert,  cooperative and appears older than stated age Head: Normocephalic, without obvious abnormality, atraumatic Neck: no adenopathy, no carotid bruit, no JVD, supple, symmetrical, trachea midline and thyroid not enlarged, symmetric, no tenderness/mass/nodules Lymph nodes: Cervical, supraclavicular, and axillary nodes normal. Resp: clear to auscultation bilaterally Cardio: Irregularly irregular  rhythm, S1, S2 normal, no murmur, click, rub or gallop GI: soft, non-tender; bowel sounds normal; no masses,  no organomegaly Extremities: extremities normal, atraumatic, no cyanosis or edema and Homans sign is negative, no sign of DVT Neurologic: Grossly normal   Wt Readings from Last 3 Encounters:  05/06/18 179 lb (81.2 kg)  05/05/18 179 lb 9.6 oz (81.5 kg)  05/03/18 180 lb 6.4 oz (81.8 kg)   Diagnostic Studies & Laboratory data:  Recent Radiology Findings:  Ct Chest W ContrastCt Abdomen Pelvis W Contrast    Result Date: 05/06/2018 CLINICAL DATA:  Esophageal carcinoma. Post neoadjuvant adjuvant chemotherapy. EXAM: CT CHEST, ABDOMEN, AND PELVIS WITH CONTRAST TECHNIQUE:  Multidetector CT imaging of the chest, abdomen and pelvis was performed following the standard protocol during bolus administration of intravenous contrast. CONTRAST:  190mL ISOVUE-300 IOPAMIDOL (ISOVUE-300) INJECTION 61% COMPARISON:  PET-CT scan 01/07/2018 FINDINGS: CT CHEST FINDINGS Cardiovascular: Coronary artery calcification and aortic atherosclerotic calcification. Mediastinum/Nodes: No axillary supraclavicular adenopathy. No mediastinal hilar adenopathy. Esophagus appears normal. No mass or obstruction. No para esophageal adenopathy. Lungs/Pleura: Ill-defined RIGHT upper lobe nodule (image 37/4) is unchanged. Centrilobular emphysema the upper lobes. New atelectasis at the LEFT lung base. Musculoskeletal: No aggressive osseous lesion. CT ABDOMEN AND PELVIS FINDINGS Hepatobiliary: Two small subcentimeter low-density lesions in the LEFT hepatic lobe not changed from prior most consistent benign cysts. These are not hypermetabolic on comparison PET-CT scan. Gallbladder normal. Pancreas: Pancreas is normal. No ductal dilatation. No pancreatic inflammation. Spleen: Normal spleen Adrenals/urinary tract: Adrenal glands normal. Benign-appearing cysts of the kidneys. Ureters and bladder normal. Stomach/Bowel: Stomach, small bowel, appendix, and cecum are normal. The colon and rectosigmoid colon are normal. Vascular/Lymphatic: Abdominal aorta is normal caliber with atherosclerotic calcification. There is no retroperitoneal or periportal lymphadenopathy. No pelvic lymphadenopathy. Reproductive: Prostate normal Other: No peritoneal metastasis Musculoskeletal: No aggressive osseous lesion. Posterior lumbar fusion IMPRESSION: Chest Impression: 1. No evidence of residual carcinoma in the esophagus. 2. No mediastinal lymphadenopathy. 3. Stable small RIGHT upper lobe nodule. 4. Increased atelectasis in  the LEFT lung base. 5. Coronary artery calcification and Aortic Atherosclerosis (ICD10-I70.0). Abdomen / Pelvis Impression:  1. No evidence of metastatic disease in the abdomen pelvis. 2. Small hypodense lesions in liver consistent with benign cysts. 3. Benign appearing cysts of the kidneys. Electronically Signed   By: Suzy Bouchard M.D.   On: 05/06/2018 17:12     I have independently reviewed the above radiology studies  and reviewed the findings with the patient.   CLINICAL DATA:  Initial treatment strategy for gastroesophageal adenocarcinoma.  EXAM: NUCLEAR MEDICINE PET SKULL BASE TO THIGH  TECHNIQUE: 9.5 mCi F-18 FDG was injected intravenously. Full-ring PET imaging was performed from the skull base to thigh after the radiotracer. CT data was obtained and used for attenuation correction and anatomic localization.  Fasting blood glucose: 96 mg/dl  COMPARISON:  Multiple exams, including CT 01/14/2018  FINDINGS: Mediastinal blood pool activity: SUV max 2.7  NECK: No significant abnormal hypermetabolic activity in this region.  Incidental CT findings: Chronic bilateral maxillary sinusitis.  CHEST: No significant abnormal hypermetabolic activity in this region.  Incidental CT findings: Centrilobular emphysema. Mild scarring anteriorly in the right upper lobe. Mild atelectasis in the left  lower lobe along the diaphragm. Coronary, aortic arch, and branch vessel atherosclerotic vascular disease. Mild cardiomegaly.  ABDOMEN/PELVIS: Subtle accentuated activity at the gastroesophageal junction/proximal stomach, maximum SUV 5.2. Further distally in the stomach body a representative maximum SUV is 3.4. No appreciable hypermetabolic activity in the liver. No hypermetabolic adenopathy in the abdomen/pelvis. Photopenic cyst of the left kidney upper pole.  Accentuated activity in the sigmoid colon is likely physiologic.  Focally accentuated activity along the right posterior prostate gland in the vicinity of the apex is not in the expected location of the prostatic urethra, and has a  maximum SUV of 6.8. Prostate cancer is a distinct possibility.  Incidental CT findings: Small hypodense lesions in the left hepatic lobe are stable from 2016 and highly likely to be benign.  Nonobstructive right nephrolithiasis. Photopenic cyst of the left kidney lower pole and of the left kidney upper pole.  Aortoiliac atherosclerotic vascular disease.  SKELETON: No significant abnormal hypermetabolic activity in this region.  Incidental CT findings: Notable right subcoracoid bursitis and severe arthropathy of the right glenohumeral joint. Degenerative bilateral hip arthropathy.  IMPRESSION: 1. Low-grade accentuated activity in the proximal stomach adjacent to the gastroesophageal junction, maximum SUV 5.2. No findings of metastatic disease to the liver or regional lymph nodes. 2. There is a small focus of hypermetabolic activity in the right posterior apical peripheral zone of the prostate gland, maximum SUV 6.8-prostate cancer versus active inflammatory lesion. No local adenopathy observed. 3. Other imaging findings of potential clinical significance: Aortic Atherosclerosis (ICD10-I70.0) and Emphysema (ICD10-J43.9). Coronary atherosclerosis. Chronic bilateral maxillary sinusitis. Nonobstructive right nephrolithiasis. Notable right subcoracoid bursitis and prominent arthropathy of the right glenohumeral joint. Degenerative bilateral hip arthropathy.   Electronically Signed   By: Van Clines M.D.   On: 01/27/2018 14:50     Ct Chest W Contrast  Result Date: 01/15/2018 CLINICAL DATA:  New diagnosis of nonobstructing gastroesophageal junction adenocarcinoma on 01/07/2018 upper endoscopy. EXAM: CT CHEST, ABDOMEN, AND PELVIS WITH CONTRAST TECHNIQUE: Multidetector CT imaging of the chest, abdomen and pelvis was performed following the standard protocol during bolus administration of intravenous contrast. CONTRAST:  12mL OMNIPAQUE IOHEXOL 300 MG/ML  SOLN  COMPARISON:  12/08/2017 chest radiograph. 07/11/2015 CT abdomen/pelvis. FINDINGS: CT CHEST FINDINGS Cardiovascular: Normal heart size. No significant pericardial effusion/thickening. Left main and 3 vessel coronary atherosclerosis. Atherosclerotic nonaneurysmal thoracic aorta. Normal caliber pulmonary arteries. No central pulmonary emboli. Mediastinum/Nodes: No discrete thyroid nodules. There is mild eccentric wall thickening posteriorly at the esophagogastric junction (series 2/image 52), without a discrete measurable mass by CT. No pathologically enlarged axillary, mediastinal or hilar lymph nodes. Lungs/Pleura: No pneumothorax. No pleural effusion. Moderate centrilobular emphysema with diffuse bronchial wall thickening. Sub solid right upper lobe 5 mm pulmonary nodule (series 4/image 36). No acute consolidative airspace disease, lung masses or additional significant pulmonary nodules. A few scattered small parenchymal bands at the lung bases, compatible with mild postinfectious/postinflammatory scarring. Musculoskeletal: No aggressive appearing focal osseous lesions. Right shoulder joint effusion/bursal collection anterior to the right scapula (series 2/image 6). Asymmetric advanced right shoulder osteoarthritis. Marked thoracic spondylosis. CT ABDOMEN PELVIS FINDINGS Hepatobiliary: Normal liver size. Subcentimeter hypodense left liver lobe lesions are too small to characterize and are stable since 07/11/2015 CT, considered benign. No new liver lesions. Normal gallbladder with no radiopaque cholelithiasis. No biliary ductal dilatation. Pancreas: Normal, with no mass or duct dilation. Spleen: Normal size. No mass. Adrenals/Urinary Tract: Normal adrenals. No hydronephrosis. Nonobstructing 5 mm lower right renal stone. Small simple renal cysts in both kidneys,  largest 2.9 cm in the lateral upper left kidney. Additional subcentimeter hypodense renal cortical lesions in both kidneys are too small to characterize and  require no follow-up. Normal bladder. Stomach/Bowel: Mild eccentric wall thickening posteriorly at the esophagogastric junction (series 2/image 52), without a discrete measurable mass by CT. Otherwise normal stomach. Normal caliber small bowel with no small bowel wall thickening. Normal appendix. Normal large bowel with no diverticulosis, large bowel wall thickening or pericolonic fat stranding. Vascular/Lymphatic: Atherosclerotic nonaneurysmal abdominal aorta. Patent portal, splenic, hepatic and renal veins. No pathologically enlarged lymph nodes in the abdomen or pelvis. Reproductive: Mildly enlarged prostate with nonspecific internal prostatic calcifications. Other: No pneumoperitoneum, ascites or focal fluid collection. Small fat containing left inguinal hernia, stable. Musculoskeletal: No aggressive appearing focal osseous lesions. Marked lumbar spondylosis. Postsurgical changes from bilateral posterior spinal fusion at L5-S1. IMPRESSION: 1. Mild eccentric wall thickening posteriorly at the esophagogastric junction, without a discrete measurable mass by CT, compatible with the provided history of esophagogastric junction malignancy. 2. No locoregional adenopathy. No definite distant metastatic disease. 3. Solitary subsolid 5 mm right upper lobe pulmonary nodule. Recommend attention on follow-up chest CT in 3-6 months. 4. Chronic findings include: Aortic Atherosclerosis (ICD10-I70.0) and Emphysema (ICD10-J43.9). Left main and 3 vessel coronary atherosclerosis. Nonobstructing right nephrolithiasis. Mildly enlarged prostate. Stable small fat containing left inguinal hernia. Electronically Signed   By: Ilona Sorrel M.D.   On: 01/15/2018 16:34     I have independently reviewed the above radiology studies  and reviewed the findings with the patient.   Recent Lab Findings: Lab Results  Component Value Date   WBC 4.9 03/25/2018   HGB 13.1 03/25/2018   HCT 39.2 03/25/2018   PLT 140 03/25/2018   GLUCOSE 107  (H) 03/03/2018   CHOL 177 12/11/2017   TRIG 112 12/11/2017   HDL 57 12/11/2017   LDLDIRECT 136.6 08/24/2013   LDLCALC 98 12/11/2017   ALT 15 03/03/2018   AST 17 03/03/2018   NA 143 03/03/2018   K 3.7 03/03/2018   CL 108 03/03/2018   CREATININE 0.78 03/03/2018   BUN 12 03/03/2018   CO2 26 03/03/2018   TSH 0.47 11/06/2016   INR 1.1 01/12/2018   HGBA1C 6.0 (H) 12/11/2017   ENDO:/ no EUS done A medium-sized, ulcerating mass was found at the gastroesophageal junction. The mass was nonobstructing and not circumferential. Biopsies were taken with a cold forceps for histology. Findings: Erythematous mucosa was found in the prepyloric region of the stomach. The in the duodenum was normal.   PATH: Diagnosis Esophagogastric junction, biopsy - ADENOCARCINOMA. - SEE COMMENT. Microscopic Comment Dr. Vicente Males has reviewed the case and concurs with this interpretation. Dr. Penelope Coop was paged on 01/08/2018. Additional studies can be performed upon clinician request. (JBK:ah 01/08/18) Enid Cutter MD Pathologist, Electronic Signature (Case signed 01/08/2018)   Pulmonary function studies done in the pulmonary office 2018 FEV1 1.9 to 70% predicted DLCO 22.4 47% predicted No formal reading with the study   Assessment / Plan:   #1 adenocarcinoma of the GE junction, partially obstructing mass noted on endoscopy 01/07/2018, CT scan of the chest and abdomen shows small 5 mm right upper lobe lung nodule and eccentric wall thickening of the GE junction #2 patient presented with GI bleed secondary to #1 with blood loss anemia #3 history of atrial fibrillation had been on Coumadin prior to admission for GI bleed-he is waiting to hear from cardiology concerning resuming his Coumadin therapy #4 known coronary artery disease #5 probable COPD-moderate  Patient has tolerated his current therapy reasonably well but remains weak.  We discussed in office today the pros and cons of total esophagectomy with the  patient's overall medical condition current weakness proceeding with esophagectomy would have a high perioperative risk factor of death or severe morbidity.  The patient does not wish to proceed with surgical resection at this point.  We will plan to see him back in 6 weeks for follow-up  Grace Isaac MD      [address] [place] [address] Office [phone]   Beeper [phone]  05/06/2018 4:36 PM

## 2018-05-07 NOTE — Telephone Encounter (Signed)
Called pt per Dr Ammie Dalton and Dr Everrett Coombe OV notes from 04/15/18 pt ok to resume Coumadin.  Dr Tamala Julian OK with resuming Coumadin as well.  Pt's previous dosage regimen was 1/2 tablet (2.5mg ) daily. Advised pt to resume 1/2 tablet daily today 06/07/18 monitor for signs and symptoms of bleeding, made OV for Coumadin Clinic on 05/12/18 at 2:30pm.  Pt verbalized understanding.

## 2018-05-12 ENCOUNTER — Ambulatory Visit (INDEPENDENT_AMBULATORY_CARE_PROVIDER_SITE_OTHER): Payer: Medicare Other | Admitting: Pharmacist

## 2018-05-12 DIAGNOSIS — I4891 Unspecified atrial fibrillation: Secondary | ICD-10-CM

## 2018-05-12 DIAGNOSIS — I480 Paroxysmal atrial fibrillation: Secondary | ICD-10-CM | POA: Diagnosis not present

## 2018-05-12 DIAGNOSIS — Z5181 Encounter for therapeutic drug level monitoring: Secondary | ICD-10-CM | POA: Diagnosis not present

## 2018-05-12 LAB — POCT INR: INR: 1.4 — AB (ref 2.0–3.0)

## 2018-05-12 NOTE — Patient Instructions (Signed)
Description   Take 5mg  (1 tablet) today then Continue same dosage of coumadin 1/2 tablet daily.   Recheck INR in 1 week.    Coumadin clinic # [phone]. Main # [phone].

## 2018-05-18 ENCOUNTER — Ambulatory Visit (INDEPENDENT_AMBULATORY_CARE_PROVIDER_SITE_OTHER): Payer: Medicare Other

## 2018-05-18 DIAGNOSIS — I480 Paroxysmal atrial fibrillation: Secondary | ICD-10-CM | POA: Diagnosis not present

## 2018-05-18 DIAGNOSIS — I4891 Unspecified atrial fibrillation: Secondary | ICD-10-CM

## 2018-05-18 DIAGNOSIS — Z5181 Encounter for therapeutic drug level monitoring: Secondary | ICD-10-CM

## 2018-05-18 LAB — POCT INR: INR: 2 (ref 2.0–3.0)

## 2018-05-18 NOTE — Patient Instructions (Signed)
Please continue same dosage of coumadin 1/2 tablet daily.   Recheck INR in 2 weeks.     Coumadin clinic # [phone]. Main # [phone].

## 2018-06-02 ENCOUNTER — Ambulatory Visit (INDEPENDENT_AMBULATORY_CARE_PROVIDER_SITE_OTHER): Payer: Medicare Other | Admitting: Pharmacist

## 2018-06-02 DIAGNOSIS — Z5181 Encounter for therapeutic drug level monitoring: Secondary | ICD-10-CM | POA: Diagnosis not present

## 2018-06-02 DIAGNOSIS — I480 Paroxysmal atrial fibrillation: Secondary | ICD-10-CM

## 2018-06-02 DIAGNOSIS — I4891 Unspecified atrial fibrillation: Secondary | ICD-10-CM | POA: Diagnosis not present

## 2018-06-02 LAB — POCT INR: INR: 2.2 (ref 2.0–3.0)

## 2018-06-02 NOTE — Patient Instructions (Signed)
Description   Please continue same dosage of coumadin 1/2 tablet daily.   Recheck INR in 3 weeks.     Coumadin clinic # [phone]. Main # [phone].

## 2018-06-07 ENCOUNTER — Other Ambulatory Visit: Payer: Self-pay | Admitting: Interventional Cardiology

## 2018-06-07 ENCOUNTER — Other Ambulatory Visit: Payer: Self-pay | Admitting: Oncology

## 2018-06-22 ENCOUNTER — Ambulatory Visit (INDEPENDENT_AMBULATORY_CARE_PROVIDER_SITE_OTHER): Payer: Medicare Other | Admitting: Pulmonary Disease

## 2018-06-22 ENCOUNTER — Encounter: Payer: Self-pay | Admitting: Pulmonary Disease

## 2018-06-22 VITALS — BP 126/74 | HR 86 | Temp 97.4°F | Ht 67.0 in | Wt 181.4 lb

## 2018-06-22 DIAGNOSIS — I480 Paroxysmal atrial fibrillation: Secondary | ICD-10-CM | POA: Diagnosis not present

## 2018-06-22 DIAGNOSIS — Z87891 Personal history of nicotine dependence: Secondary | ICD-10-CM

## 2018-06-22 DIAGNOSIS — I2510 Atherosclerotic heart disease of native coronary artery without angina pectoris: Secondary | ICD-10-CM | POA: Diagnosis not present

## 2018-06-22 DIAGNOSIS — R0609 Other forms of dyspnea: Secondary | ICD-10-CM

## 2018-06-22 DIAGNOSIS — J4520 Mild intermittent asthma, uncomplicated: Secondary | ICD-10-CM | POA: Diagnosis not present

## 2018-06-22 DIAGNOSIS — I10 Essential (primary) hypertension: Secondary | ICD-10-CM | POA: Diagnosis not present

## 2018-06-22 DIAGNOSIS — C160 Malignant neoplasm of cardia: Secondary | ICD-10-CM

## 2018-06-22 MED ORDER — AZITHROMYCIN 250 MG PO TABS: ORAL_TABLET | ORAL | 0 refills | Status: DC

## 2018-06-22 MED ORDER — METHYLPREDNISOLONE 4 MG PO TBPK: ORAL_TABLET | ORAL | 0 refills | Status: DC

## 2018-06-22 NOTE — Patient Instructions (Addendum)
Today we updated your med list in our EPIC system...    Continue your current medications the same...  Today we wrote for a Zithromax ZPak & Medrol dosepak for your upper resp infection...    Take as directed on the packs...  As we discussed, I will be retiring at the end of 2019 & I would like you to follow up w/ my partner- DrByrum in about 3-4 months...   Larry Bautista,  It has been my privilege to have been one of your doctors over these past few years!    My best wishes to you and your family.Marland KitchenMarland Kitchen

## 2018-06-23 NOTE — Progress Notes (Signed)
Cardiology Office Note:    Date:  06/24/2018   ID:  Larry Bautista, DOB 12-18-1940, MRN [id number]  PCP:  Larry Sons, MD  Cardiologist:  No primary care provider on file.   Referring MD: Larry Sons, MD   Chief Complaint  Patient presents with  . Coronary Artery Disease  . Atrial Fibrillation    History of Present Illness:    Larry Bautista is a 77 y.o. male with a hx of CAD, chronic atrial fibrillation, anticoagulation therapy, hypertension, and chronic diastolic heart failure.   [name] was diagnosed with esophageal cancer earlier this year.  He had hematemesis as the presenting symptom.  He has been on chronic Coumadin anticoagulation for many years because of chronic atrial fibrillation.  Anticoagulation is for stroke prophylaxis.  He has history of CAD with prior LAD stent in 2004.  He had received transfusions after presenting with hematemesis.  He did not have any cardiac complications specifically angina, shortness of breath, life-threatening arrhythmia, or other problems.  He underwent chemotherapy and radiation.  He is now recuperating.   Past Medical History:  Diagnosis Date  . Allergy   . CHF (congestive heart failure) ([place])   . Chronic knee pain   . Coronary artery disease    with LAD DE stent 2004  . Heel spur, left   . Hyperlipidemia   . Hypertension   . Kidney stones   . Lower back pain    status post surgery for spondylolisthesis  . Migraine   . Persistent atrial fibrillation    longstanding persistent (since 05/2008)  . Plantar fasciitis     Past Surgical History:  Procedure Laterality Date  . BACK SURGERY     spondylolisthesis  . CARDIAC CATHETERIZATION  03/06/2003  . CORONARY ANGIOPLASTY WITH STENT PLACEMENT  04/2003   CYPHER stent implantation in the LAD  . ESOPHAGOGASTRODUODENOSCOPY (EGD) WITH PROPOFOL N/A 01/07/2018   Procedure: ESOPHAGOGASTRODUODENOSCOPY (EGD) WITH PROPOFOL;  Surgeon: Wonda Horner, MD;  Location: [facility] ENDOSCOPY;   Service: Endoscopy;  Laterality: N/A;  . HAND SURGERY Right 2014   carpal tunnel  . SKIN CANCER EXCISION  1980's   Face  . VASCULAR SURGERY     Vascular Stent    Current Medications: Current Meds  Medication Sig  . albuterol (PROAIR HFA) 108 (90 Base) MCG/ACT inhaler Inhale 1-2 puffs into the lungs every 6 (six) hours as needed for wheezing or shortness of breath.  [name] azithromycin (ZITHROMAX) 250 MG tablet Take as directed  . cetirizine (ZYRTEC) 10 MG tablet Take 10 mg by mouth daily.  . diphenhydramine-acetaminophen (TYLENOL PM) 25-500 MG TABS Take 1 tablet by mouth at bedtime as needed (for sleep).   [name] eplerenone (INSPRA) 25 MG tablet Take 1 tablet (25 mg total) by mouth daily. Please keep upcoming appt in October for future refills. Thank you  . fluticasone (FLONASE) 50 MCG/ACT nasal spray USE 2 SPRAYS IN EACH NOSTRIL DAILY  . gabapentin (NEURONTIN) 300 MG capsule Take 300 mg by mouth every other day.   . methylPREDNISolone (MEDROL DOSEPAK) 4 MG TBPK tablet 6 day pak-take as directed  . metoprolol succinate (TOPROL-XL) 25 MG 24 hr tablet Take 1 tablet (25 mg total) by mouth daily. Please keep upcoming appt in October before anymore refills. Thank you  . nitroGLYCERIN (NITROSTAT) 0.4 MG SL tablet Place 0.4 mg under the tongue every 5 (five) minutes as needed.  . pantoprazole (PROTONIX) 40 MG tablet TAKE ONE (1) TABLET EACH DAY  .  psyllium (METAMUCIL) 58.6 % powder Take 1 packet by mouth daily.   . tamsulosin (FLOMAX) 0.4 MG CAPS capsule Take 0.4 mg by mouth daily after supper.  . vitamin C (ASCORBIC ACID) 500 MG tablet Take 500 mg by mouth daily.  [name] warfarin (COUMADIN) 2.5 MG tablet Take 2.5 mg by mouth daily.     Allergies:   Celecoxib; Lisinopril; and Simvastatin   Social History   Socioeconomic History  . Marital status: Married    Spouse name: Larry Bautista  . Number of children: 1  . Years of education: Not on file  . Highest education level: Bachelor's degree (e.g.,  BA, AB, BS)  Occupational History  . Occupation: retired  Scientific laboratory technician  . Financial resource strain: Not hard at all  . Food insecurity:    Worry: Never true    Inability: Never true  . Transportation needs:    Medical: No    Non-medical: No  Tobacco Use  . Smoking status: Former Smoker    Packs/day: 2.00    Years: 35.00    Pack years: 70.00    Types: Cigarettes    Start date: 09/08/1954    Last attempt to quit: 11/06/1989    Years since quitting: 28.6  . Smokeless tobacco: Never Used  . Tobacco comment: Smoked from [phone] (35 years)  Substance and Sexual Activity  . Alcohol use: Not Currently    Alcohol/week: 2.0 standard drinks    Types: 2 Standard drinks or equivalent per week    Comment: 1961-2009 (Last drink 2009)   . Drug use: No  . Sexual activity: Not on file  Lifestyle  . Physical activity:    Days per week: Not on file    Minutes per session: Not on file  . Stress: Not at all  Relationships  . Social connections:    Talks on phone: Not on file    Gets together: Not on file    Attends religious service: Not on file    Active member of club or organization: Not on file    Attends meetings of clubs or organizations: Not on file    Relationship status: Not on file  Other Topics Concern  . Not on file  Social History Narrative   Retired Insurance underwriter. Married in 2015. Has one daughter, and 4 grandchildren. He lives in [place].     Family History: The patient's family history includes Atrial fibrillation in his brother and brother; Bladder Cancer in his brother; COPD in his brother; Colon cancer in his father; Congestive Heart Failure in his brother and father; Heart attack in his father; Lung cancer in his brother and mother; Migraines in his daughter; Stroke in his brother.  ROS:   Please see the history of present illness.    Food does not have normal taste as it did prior to chemotherapy and radiation.  He has had no recurrent bleeding.  He is back on Coumadin.   He denies dysphagia.  He has had some rash associated with radiation.  Decreased hearing.  Wheezing is been noted.  All other systems reviewed and are negative.  EKGs/Labs/Other Studies Reviewed:    The following studies were reviewed today:  Chest CT May 06, 2018: IMPRESSION: Chest Impression:   1. No evidence of residual carcinoma in the esophagus. 2. No mediastinal lymphadenopathy. 3. Stable small RIGHT upper lobe nodule. 4. Increased atelectasis in  the LEFT lung base. 5. Coronary artery calcification and Aortic Atherosclerosis    EKG:  EKG is not ordered today.  The ekg ordered today demonstrates 01/11/2018 demonstrates atrial fibrillation, nonspecific ST abnormality, no acute change.  This tracing was done in setting of severe anemia during an episode of hematemesis.  Recent Labs: 03/03/2018: ALT 15; BUN 12; Creatinine 0.78; Potassium 3.7; Sodium 143 03/25/2018: Hemoglobin 13.1; Platelet Count 140  Recent Lipid Panel    Component Value Date/Time   CHOL 177 12/11/2017 1128   TRIG 112 12/11/2017 1128   HDL 57 12/11/2017 1128   CHOLHDL 3.1 12/11/2017 1128   CHOLHDL 3 01/24/2015 0926   VLDL 26.8 01/24/2015 0926   LDLCALC 98 12/11/2017 1128   LDLDIRECT 136.6 08/24/2013 0938    Physical Exam:    VS:  BP 130/82   Pulse (!) 53   Ht 5\' 7"  (1.702 m)   Wt 179 lb 6.4 oz (81.4 kg)   SpO2 97%   BMI 28.10 kg/m     Wt Readings from Last 3 Encounters:  06/24/18 179 lb 6.4 oz (81.4 kg)  06/22/18 181 lb 6.4 oz (82.3 kg)  05/06/18 179 lb (81.2 kg)     GEN: Mildly obese well nourished, well developed in no acute distress HEENT: Normal NECK: No JVD. LYMPHATICS: No lymphadenopathy CARDIAC: Irregularly irregular RR, no murmur, no gallop, no edema. VASCULAR: 2+ bilateral radial and carotid pulses.  No bruits. RESPIRATORY:  Clear to auscultation without rales, wheezing or rhonchi  ABDOMEN: Soft, non-tender, non-distended, No pulsatile mass, MUSCULOSKELETAL: No deformity    SKIN: Warm and dry NEUROLOGIC:  Alert and oriented x 3 PSYCHIATRIC:  Normal affect   ASSESSMENT:    1. Chronic atrial fibrillation   2. Essential hypertension   3. Other hyperlipidemia   4. CAD in native artery   5. Encounter for therapeutic drug monitoring    PLAN:    In order of problems listed above:  1. Adequate rate control with no embolic complications on Coumadin therapy. 2. Blood pressure is at a below target of 130/80 mmHg. 3. LDL cholesterol target is less than 70.  No recent data is available.  LDL was 90 in April 2019. 4. Stable without angina.  Risk factor modification as discussed.  He understands importance of moderate aerobic activity, blood pressure control, glycemic monitoring and keeping A1c less than 7, LDL control,  Clinical follow-up will be in 1 year.  Continue anticoagulation therapy.  Monitor for bleeding.  Call if cardiac symptoms.   Medication Adjustments/Labs and Tests Ordered: Current medicines are reviewed at length with the patient today.  Concerns regarding medicines are outlined above.  No orders of the defined types were placed in this encounter.  No orders of the defined types were placed in this encounter.   Patient Instructions  Medication Instructions:  Your physician recommends that you continue on your current medications as directed. Please refer to the Current Medication list given to you today.  If you need a refill on your cardiac medications before your next appointment, please call your pharmacy.   Lab work: None If you have labs (blood work) drawn today and your tests are completely normal, you will receive your results only by: [name] MyChart Message (if you have MyChart) OR . A paper copy in the mail If you have any lab test that is abnormal or we need to change your treatment, we will call you to review the results.  Testing/Procedures: None  Follow-Up: At [facility], you and your health needs are our priority.  As  part of our continuing mission  to provide you with exceptional heart care, we have created designated Provider Care Teams.  These Care Teams include your primary Cardiologist (physician) and Advanced Practice Providers (APPs -  Physician Assistants and Nurse Practitioners) who all work together to provide you with the care you need, when you need it. You will need a follow up appointment in 8-12 months.  Please call our office 2 months in advance to schedule this appointment.  You may see Dr. Tamala Julian or one of the following Advanced Practice Providers on your designated Care Team:   Truitt Merle, NP Cecilie Kicks, NP . Kathyrn Drown, NP  Any Other Special Instructions Will Be Listed Below (If Applicable).       Signed, Sinclair Grooms, MD  06/24/2018 11:04 AM    [place]

## 2018-06-24 ENCOUNTER — Encounter: Payer: Self-pay | Admitting: Interventional Cardiology

## 2018-06-24 ENCOUNTER — Ambulatory Visit (INDEPENDENT_AMBULATORY_CARE_PROVIDER_SITE_OTHER): Payer: Medicare Other | Admitting: Interventional Cardiology

## 2018-06-24 ENCOUNTER — Ambulatory Visit (INDEPENDENT_AMBULATORY_CARE_PROVIDER_SITE_OTHER): Payer: Medicare Other | Admitting: *Deleted

## 2018-06-24 ENCOUNTER — Other Ambulatory Visit: Payer: Self-pay | Admitting: Interventional Cardiology

## 2018-06-24 VITALS — BP 130/82 | HR 53 | Ht 67.0 in | Wt 179.4 lb

## 2018-06-24 DIAGNOSIS — I482 Chronic atrial fibrillation, unspecified: Secondary | ICD-10-CM

## 2018-06-24 DIAGNOSIS — I10 Essential (primary) hypertension: Secondary | ICD-10-CM

## 2018-06-24 DIAGNOSIS — I4891 Unspecified atrial fibrillation: Secondary | ICD-10-CM | POA: Diagnosis not present

## 2018-06-24 DIAGNOSIS — I480 Paroxysmal atrial fibrillation: Secondary | ICD-10-CM

## 2018-06-24 DIAGNOSIS — Z5181 Encounter for therapeutic drug level monitoring: Secondary | ICD-10-CM | POA: Diagnosis not present

## 2018-06-24 DIAGNOSIS — E7849 Other hyperlipidemia: Secondary | ICD-10-CM

## 2018-06-24 DIAGNOSIS — I2510 Atherosclerotic heart disease of native coronary artery without angina pectoris: Secondary | ICD-10-CM

## 2018-06-24 LAB — POCT INR: INR: 1.9 — AB (ref 2.0–3.0)

## 2018-06-24 NOTE — Patient Instructions (Signed)
Medication Instructions:  Your physician recommends that you continue on your current medications as directed. Please refer to the Current Medication list given to you today.  If you need a refill on your cardiac medications before your next appointment, please call your pharmacy.   Lab work: None If you have labs (blood work) drawn today and your tests are completely normal, you will receive your results only by: [name] MyChart Message (if you have MyChart) OR . A paper copy in the mail If you have any lab test that is abnormal or we need to change your treatment, we will call you to review the results.  Testing/Procedures: None  Follow-Up: At [facility], you and your health needs are our priority.  As part of our continuing mission to provide you with exceptional heart care, we have created designated Provider Care Teams.  These Care Teams include your primary Cardiologist (physician) and Advanced Practice Providers (APPs -  Physician Assistants and Nurse Practitioners) who all work together to provide you with the care you need, when you need it. You will need a follow up appointment in 8-12 months.  Please call our office 2 months in advance to schedule this appointment.  You may see Dr. Tamala Julian or one of the following Advanced Practice Providers on your designated Care Team:   Truitt Merle, NP Cecilie Kicks, NP . Kathyrn Drown, NP  Any Other Special Instructions Will Be Listed Below (If Applicable).

## 2018-06-24 NOTE — Patient Instructions (Signed)
Description   Today take 1 tablet then continue same dosage of coumadin 1/2 tablet daily. Recheck INR in 2 weeks.  Coumadin clinic # [phone]. Main # [phone].

## 2018-06-25 ENCOUNTER — Encounter: Payer: Self-pay | Admitting: Pulmonary Disease

## 2018-06-25 NOTE — Progress Notes (Addendum)
Subjective:     Patient ID: Larry Bautista, male   DOB: 05-28-1941, 77 y.o.   MRN: [id number]  HPI 77 y/o WM referred by Dr. Juanetta Beets, [facility], for a pulmonary evaluation due to intermittent wheezing and an abnormal spirometry test...   ~  I reviewed old EPIC records for pertinent DATA>> ~  CXR 04/05/14 showed norm heart size, tortuous Ao, clear lungs- NAD, DDD in Tspine...  ~  Spirometry 08/06/16 in DrFisher's office> FVC=2.20 (57%), FEV1=1.62 (59%), %1sec=74%, mid-flows reduced at 59% predicted; This is c/w a mod restrictive ventilatory defect w/ superimposed small airways dis.  ~  October 01, 2016:  Initial pulmonary consultation by SN>      Larry Bautista reports that he has been SOB & wheezing for ~39yr the symptoms are intermittent but seem worse supine per pt; his PCP has treated him for bronchitis w/ antibiotics and Pred w/ improvement but the symptoms return... He notes SOB/ DOE but states it's due to "lack of exercise"- ADLs are OK, stairs are a problem, carrying a load, etc; he says stable & no real change over the last yr... He's also noted a mild dry nagging cough & he is on an ACE for HBP and hx CAD...  Smoking Hx>  he is an ex-smoker, started ~15, smoked regularly 1ppd, transiently up to 2ppd, Quit 1991 at age [age]because "I didn't like being addicted"; total ~35 yrs smoking & ~40 pack-yr history...  Pulmonary Hx>  He denies hx lung problems until the last yr or so; denies hx asthma, pneumonia, TB or exposure, etc...  Medical Hx>  HBP, CAD w/ stent, AFib (followed by DrHSmith), HL, kidney stones/BPH, DJD/ LBP,   Family Hx>  Lung cancer in mother, heart dis & colon cancer in father, lung cancer & COPD in brother...  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  Current Meds>  No resp meds- on Coumadin, ToprolXL, Lisinopril, Eplerenone, Pravachol, Flomax, Neurontin    Immuniz status>  Epic  records indicate that he needs 2017 Flu vaccine & Pneumovax-23 shot...  EXAM shows Afeb, VSS, O2sat=95% on RA;  HEENT- neg, mallampati2;  Chest- clear x few basilar rhonchi, no consolidation;  Heart- Irreg AFib w/o m/r/g;  Abd- soft, nontender, neg;  Ext- neg w/o c/c/e;  Neuro- intact...   CXR 10/01/16> HE DID NOT GO TO XRAY FOR THE REQUESTED FILM..Marland KitchenMarland Kitchen Ambulatory Oximetry 10/01/16> O2sat=97% on RA at rest w/ pulse=68/min;  He walked 3 laps in the office (185'each) w/ lowest O2sat=94% w/ pulse=98/min...  LABS 10/01/16> HE DID NOT GO TO THE LAB FOR THE REQUESTED BLOOD WORK...  Full PFTs > scheduled and pending IMP  >>        DOE> likely multifactorial w/ components from lung/ heart/ deconditioning...      AbnSpirometry> it appears more restricted than obstructed, need to correlate to current imaging studies...      Ex-smoker> he quit in 1991 at age [age]w/ ~~64pack-yr smoking hx...      Cardiac issues> followed by DrHSmith-- HBP, CAD- s/p stent, AFib on coumadin...      Medical issues> followed by DrFisher-- HBP, HL, kidney stones/ BPH, DJD/ LBP PLAN >>   10/01/16:   We need some further assessment on Larry Bautista's lungs w/ a current CXR, review blood work, and Full PFTs;  He does not desat w/ exercise;  The most important intervention at this point is to  increase his exercise program- [facility], silver sneakers, or similar group;  We are holding off on additional meds until we can see this data...   ~  November 06, 2016:  74moROV w/ SN>  JFischerreports that he is about the same, but he is c/o more cough today, worse supine he thinks, does a lot of throat clearing;  He notes occas wheezing & reports that "allergies are really bad this yr" with runny nose & sneezing but "cough is from my meds";  He notes that he's been on the Lisinopril for >526yrnow & we wll check IgE & RASTs...    EXAM shows Afeb, VSS, O2sat=97% on RA;  HEENT- neg, mallampati2;  Chest- clear x min basilar rhonchi, no consolidation;  Heart- Irreg  AFib w/o m/r/g;  Abd- soft, nontender, neg;  Ext- neg w/o c/c/e;  Neuro- intact...   CXR 11/06/16>  Norm heart size, atherosclerotic Ao, sl pleural thickening bilat, min blunting left costophrenic angle...  Full PFTs 11/06/16>  FVC=2.45 (64%), FEV1=1.92 (70%), %1sec=78%; mid-flows are wnl at 85%; post bronchodil there was a sl response (FEV1 incr to 2.12 (77%) for a 10% improvement;  TLC=5.26 (79%), RV=2.63 (105%), RV/TLC=50&;  DLCO=75% predicted  This is c/w mild restriction, no real obstruction BUT a 10% improvement in FEV1 after the bronchodil), air trapping, & mild decr in DLCO noted...  LABS 11/06/16>  Chems- wnl w/ BS=98, Cr=0.80, LFTs wnl;  CBC- wnl w/ Hg=15.3;  TSH=0.47;  Sed=2;  IgE=13;  RAST- NEG x grasses are borderline IMP >>         DOE> likely multifactorial w/ components from lung/ heart/ deconditioning...      AbnPFTs> it appears more restricted than obstructed, need to correlate to current imaging studies...      Ex-smoker> he quit in 1991 at age 77/ ~4~21ack-yr smoking hx...      Cardiac issues> followed by DrHSmith-- HBP, CAD- s/p stent, AFib on coumadin...      Medical issues> followed by DrFisher-- HBP, HL, kidney stones/ BPH, DJD/ LBP PLAN >>  10/01/16:   We need some further assessment on Larry Bautista's lungs w/ a current CXR, review blood work, and Full PFTs;  He does not desat w/ exercise;  The most important intervention at this point is to increase his exercise program- eg at the Y, silver sneakers, or similar group;  We are holding off on additional meds until we can see this data...  11/06/16:   Larry Bautista persistent DOE and a mild cough- again these are most likely multifactorial in etiology w/ components from pulm/ cardiac/ deconditioning as noted; he is also on an ACE & although he quit smoking in 1991, he has been way too sedentary w/o exercise program; Labs are all WNL x borderline RAST pos to grasses... REC to STOP Lisinopril, monitor BP at home to see if additional med needed or  not, deep breathing exercises... we plan rov recheck in 69m60mo~  February 09, 2017:  69mo60mo & pulmonary follow up visit>  JerrGeraldoer stopped the Lisinopril as directed after the last visit and now he indicates that he has a low BP ~80 in the AM, but improved to 110-120 range throughout the day;  He also notes sl chronic cough & throat clearing which could also be related to the ACE inhibitor;  He does note however that his SOB/DOE is sl better since he has started an exercise program- walking/ stairs/ etc...[name]  He saw DrSharma for Allergy 12/12/16>  Chr rhinorrhea, clear mucus, skin testing was pos for grass & weeds, food testing NEG;  They rec Zyrtek, Flonase, Atrovent nasal...    DOE is sl improved w/ starting an exercise program...    HBP on ToprolXL25, Lisin20 (he never stopped as prev instructed), Eplerenone25; BP= 114/66 & we discussed stopping the ACE inhib rx again, monitor BP at home & call for any questions...    CAD, Afib on Coumadin, ASA81 and followed by PCP-Dr.Don Fisher & CARDS- Dr.HSmith EXAM shows Afeb, VSS, O2sat=97% on RA;  HEENT- neg, mallampati2;  Chest- clear x min basilar rhonchi, no consolidation;  Heart- Irreg AFib w/o m/r/g;  Abd- soft, nontender, neg;  Ext- neg w/o c/c/e;  Neuro- intact... IMP/PLAN>>  Taytum is reminded to stop the Lisinopril completely & monitor his BP & symptoms going forward; hopefully his resp symptoms will resolve off the ACE + continued allergy rx per DrSharma;  He needs to maintain & push his exercise program...  ~  August 10, 2017:  53moROV & JGodricreturns noting that his breathing is stable to sl improved off the ACE inhib & improved exercise program although he is lim by arthritis pain knees & hx LBP w/ surg in past;  He notes mild cough, small amt tan mucus, no hemoptysis; he denies much SOB but notes some DOE w/ stairs etc; no CP/ palpit/ dizzy/ edema- recqll that he has AFib followed by DrHSmith for Cards... He rests well at night, denies snoring prob,  daytime sleepiness issues, etc...  We reviewed the following interval Epic notes>      He saw Urology-DrEskridge 76/25/18 for f/u BPH>  On Flomax0.4, PVR~100, last PSA was 09/2016= 2.10 (stable);  Rec to try cialis20 prn for ED...    [facility]  He met their goals for rehab & was discharged 03/26/17 after 13 treatments...     He is followed in the Coumadin Clinic regularly> most recently 07/29/17 on Coumadin 535mtabs- taking 1/2 tab daily x7d w/ INR= 2.2, continue same... We reviewed the following medical problems during today's office visit>     DOE> felt to be multifactorial w/ components from lung/ heart/ deconditioning- he notes more limited by athritis in knees & LBP w/ hx surg...    AR> followed by DrSharma (eval 12/2016) & allergic to grasses & weeds on testing; Rx w/ Zyrtek, Flonase, Atrovent nasal; allergy shots were offered but not started.    AbnPFTs> he appears more restricted than obstructed based on his PFT, CXR shows basically clear, some mild incr bilat pleural stripe noted...    Ex-smoker> he quit in 1991 at age 77/ ~4~59ack-yr smoking hx...    Cardiac issues> followed by DrHSmith-- HBP, CAD- s/p stent, AFib on coumadin...    Medical issues> followed by DrFisher-- HBP, HL, kidney stones/ BPH, DJD/ LBP (DrNudelman is weaning his Neurontin); we stopped his ACE rx & he is improved...  EXAM shows Afeb, VSS, O2sat=98% on RA;  HEENT- neg, mallampati2;  Chest- clear x min basilar rhonchi, no consolidation;  Heart- Irreg AFib w/o m/r/g;  Abd- soft, nontender, neg;  Ext- neg w/o c/c/e;  Neuro- intact... IMP/PLAN>>  JeMarlyns stable overall, and breathing sl better off ACE & on a mild exercise program; so far he has NOT required breathing meds/ inhalers- improved w/ his allergy meds per DrSharma- Zyrtek10, Flonase/ Atrovent nasal; we will continue to monitor...   ~  December 08, 2017:  38moROV & add-on appt requested for  "bronchitis" noting a 1wk hx cough, tan colored sput, no hemoptysis, plus low grade fever, w/o c/s;  He's noted incr SOB, but no CP/ palpit/ edema... Exam shows insp/exp wheezing & bibasilar rhonchi + chest congestion => c/w asthmatic bronchitic exacerbation... NOTE> [facility] & didn't mention any resp symptoms...     DOE> felt to be multifactorial w/ components from lung/ heart/ deconditioning- he notes more limited by athritis in knees & LBP w/ hx surg...    AR> followed by DrSharma (eval 12/2016) & allergic to grasses & weeds on testing; Rx w/ Zyrtek, Flonase, Atrovent nasal; allergy shots were offered but not started; we did RAST tests 3/18 w/ IgE=13 & RAST pos to grasses only.    AbnPFTs> he appeared more restricted than obstructed based on his PFT, CXR shows basically clear, some mild incr bilat pleural stripe noted...    Ex-smoker> he quit in 1991 at age [age]w/ ~~49pack-yr smoking hx... EXAM shows Afeb, VSS, O2sat=96% on RA;  HEENT- neg, mallampati2;  Chest- insp/exp wheezing & bibasilar rhonchi w/ chest congestion, no consolidation;  Heart- Irreg AFib w/o m/r/g;  Abd- soft, nontender, neg;  Ext- neg w/o c/c/e.   CXR 12/08/17 (independently reviewed by me in the PACS system) showed norm heart size, atherosclerotic changes in Ao, clear lungs w/ bilat pleurl thickening & min scarring left base- NAD...   LABS 12/08/17>  Chems- wnl x AST=40;  CBC- wnl w/ wbc=8.6;  Sed=32 IMP/PLAN>>  JTyionhas acute bronchitis w/ an AB exac=> we discussed Rx w/ Levaquin & a tapering course of PRED; he will also benefit from ProairHFA for prn use & the addition of MCINEX as a mucolytic... We plan an rov recheck in 3-4 weeks to assess the need for any long acting inhalers...  ~  December 29, 2017:  3wk ROV & pulm recheck>  When seen 4/2 JAmaliohad an acute AB exac precipitated by a bronchitic infection w/ wheezing, rhonchi, chest congestion;  His CXR showed chr  changes but NAD & his Labs were ok x Sed=32;  We treated him w/ Levaquin, Pred course, Mucinex & fluids;  Asked to ret to assess response & decide whether ICS/LABA inhaler indicated...  He reports a great response to out therapy, estimates 90+% better now w/ min wheezing in the early AM if he lies on his left side, but otherw clear, no dyspnea, cough, sput, etc... He has finished the Levaquin, weaned the Pred to '10mg'$  Qod at this time & doesn't feel he needs the Mucinex anymore;  He has Albuterol-HFA for prn use & doesn't want any additional inhalers unless absolutely needed...     EXAM shows Afeb, VSS, O2sat=97% on RA;  HEENT- neg, mallampati2;  Chest- clear now w/o w/r/r;  Heart- Irreg AFib w/o m/r/g;  Abd- soft, nontender, neg;  Ext- neg w/o c/c/e.  IMP/PLAN>>  JFrankis an ex-smoker w/ AB, AR, multifactorial dyspnea;  Asked to finish the Pred'10mg'$  Qod til gone, & use the albutHFA inhaler as needed; we reviewed need for regular exercise, we plan rov recheck in 3-435mo.   ~  May 03, 2018:  96m36moV & JerRoalds had a very eventful interval since his last visit w/ me on 12/29/17> he was ADM 01/07/18 w/ an acute GIB having black stools for several days followed by n/v/ hemetemesis (he was on coumadin for Afib);  Diagnosed w/ Esoph AdenoCa- treated by DrSherrill w/ chemoradiation (weekly taxol/ carboplatin & XRT completed 03/17/18; he then saw DrGerhardt for Cardiothoracic surg- pt wants to avoid surg and his cancer team continues to follow closely>    Adenocarcinoma of the GE junction: ? Partially obstructing mass noted at the GE junction on endoscopy 01/07/2018, nonobstructing ? CTscan> 01/14/2018, sub-solid 5 mm right upper lobe nodule, mild eccentric wall thickening at the GE junction ? PET scan 01/27/2018-hypermetabolism at the GE junction, no evidence of metastatic disease ? Initiation of radiation and weekly Taxol/carboplatin (DrSherrill) 02/03/2018 ? Week 2 Taxol/carboplatin 02/10/2018 ? Week 3 Taxol/carboplatin  02/17/2018 ? Week 4 Taxol/carboplatin 02/24/2018 ? Week 5 Taxol/carboplatin 03/03/2018 ? Radiation completed (DrMoody) 03/17/2018 Staging eval w/ 69m RUL nodule and PET neg for any obvious mets;  He has been rechecked by CARDS- DrHSmith and restarted on his Coumadin for AFib w/ CHADS Vasc >3... JTrenotes that his breathing has improved, denies wheezing, no sput, no hemoptysis, but still feels weak w/ SOB on stairs (but ADLs and walking on level ground is OK);  He remains on ZHickmanas needed; also ToprolXL25 & Eplerenone25... EXAM shows Afeb, VSS, O2sat=98% on RA;  Wt= 180# (down 9#);  HEENT- neg, mallampati2;  Chest- decr BS at bases w/ bilat rales, no wheezing/ rhonchi/ or consolidation;  Heart- Irreg AFib w/o m/r/g;  Abd- soft, nontender, neg;  Ext- no c/c but he has 2+edema...  Interval LABS>  Reviewed-- last K=3.7 and Cr=0.78 IMP/PLAN>>  Diagnosed w/ Espoh Ca- treated w/ chemoradiation & improved;  On review JMinorhad lost 20# but gained back 11# w/ boost & improved appetite, but he has 2+ edema today=> rec adding LASIX20, restrict sodium, support hose, etc... We plan recheck in 6-8 weeks.  ~  CT Chest, Abd, Pelvis w/ contrast 05/06/18>>  IMPRESSION:  CHEST: 1. No evidence of residual carcinoma in the esophagus. 2. No mediastinal lymphadenopathy. 3. Stable small RIGHT upper lobe nodule. 4. Mild centrilob emphysema & new atelectasis in the LEFT lung base. 5. Coronary artery calcification and Aortic Atherosclerosis ABDOMEN: 1. No evidence of metastatic disease in the abdomen pelvis. 2. Small hypodense lesions in liver consistent with benign cysts. 3. Benign appearing cysts of the kidneys.   ~  June 22, 2018:  6wk ROV & pulmonary follow up visit>  JDarraghreturns c/o 1-2 wks of head congestion, mild cough w/ small amt clear sput, no hemoptysis, denies f/c/s, no CP etc; he notes that his breathing was good until this URI- DOE stable eg- stairs but ok on level ground;  Note- prev edema resolved on Lasix20, and pt has stopped this med;  Intervally he has had f/u visits w/ DrMoody (XRT completed 03/17/18), DrGerhardt (no plans for surg intervention so far), DrSherrill (he restarted pt's Coumadin, does not rec surveillance CT scans/ EGD unless pt decided to proceed w/ surg), DrHSmith (doing satis back on coumadin, f/u 150yr.. We reviewed the following medical problems during today's office visit>     DOE> felt to be multifactorial w/ components from lung/ heart/ deconditioning- he notes more limited by athritis in knees & LBP w/ hx surg; on ProairHFA as needed for wheezing...    AR> followed by DrSharma (eval 12/2016) & allergic to grasses & weeds on testing; Rx w/ Zyrtek, Flonase, Atrovent nasal; allergy shots were offered but not started; we did RAST tests 3/18 w/ IgE=13 & RAST pos to grasses only.    AbnPFTs> he appeared more restricted than obstructed  based on his PFT, CXR shows basically clear, some mild incr bilat pleural stripe noted...    Abn CTChest w/ mild centrilob emphysema & 6m RUL nodule> he was ADM 01/2018 w/ GIB on coumadin, CT showed sub-solid 5 mm right upper lobe nodule & mild eccentric wall thickening at the GE junction, EGD showed AdenoCa=> subseq chemoradiation;  f/u CT 04/2018 w/ stable RUL nodule.    Ex-smoker> he quit in 1991 at age 89w/ ~~1pack-yr smoking hx...    ADENOCARCINOMA at [employer]> diagnosed 01/2018 when he presented w/ GIB on coumadin; treated by DrSherrill, DrMoody, DrGerhardt w/ chemoradiation, no surg, and f/u scan looked good; he remains on Protonix40...    Cardiac issues> followed by DrHSmith-- HBP, CAD- s/p stent, AFib on coumadin; on ToprolXL25, Eplerenone25, Coumadin clinic...    Medical issues> followed by DrFisher-- HBP, HL, kidney stones/ BPH (on Flomax), DJD/ LBP (DrNudelman is weaning his Neurontin); we prev stopped his ACE rx... EXAM shows Afeb, VSS, O2sat=95% on RA;  Wt= 181# (stable);  HEENT- neg, mallampati2;  Chest-  decr BS at bases but clear- no wheezing/ rhonchi/ or consolidation;  Heart- Irreg AFib w/o m/r/g;  Abd- soft, nontender, neg;  Ext- no c/c & prev edema has resolved... IMP/PLAN>>  JHarvirhas a mild URI and so far no major AB exac- we will treat w/ ZPak & Medrol dosepak;  He will need continue Pulm follow up for his AB and the 585mRUL nodule found on CT Chest 01/2018=> we will arrange for an appt w/ DrByrum in 3-36m20mo I will be retiring in Jan2020...     Past Medical History:  Diagnosis Date  . Allergy   . CHF (congestive heart failure) [place] . Chronic knee pain   . Coronary artery disease    with LAD DE stent 2004  . Heel spur, left   . Hyperlipidemia   . Hypertension   . Kidney stones   . Lower back pain    status post surgery for spondylolisthesis  . Migraine   . Persistent atrial fibrillation    longstanding persistent (since 05/2008)  . Plantar fasciitis     Past Surgical History:  Procedure Laterality Date  . BACK SURGERY     spondylolisthesis  . CARDIAC CATHETERIZATION  03/06/2003  . CORONARY ANGIOPLASTY WITH STENT PLACEMENT  04/2003   CYPHER stent implantation in the LAD  . ESOPHAGOGASTRODUODENOSCOPY (EGD) WITH PROPOFOL N/A 01/07/2018   Procedure: ESOPHAGOGASTRODUODENOSCOPY (EGD) WITH PROPOFOL;  Surgeon: GanWonda HornerD;  [facility];  Service: Endoscopy;  Laterality: N/A;  . HAND SURGERY Right 2014   carpal tunnel  . SKIN CANCER EXCISION  1980's   Face  . VASCULAR SURGERY     Vascular Stent    Outpatient Encounter Medications as of 06/22/2018  Medication Sig  . albuterol (PROAIR HFA) 108 (90 Base) MCG/ACT inhaler Inhale 1-2 puffs into the lungs every 6 (six) hours as needed for wheezing or shortness of breath.  . cetirizine (ZYRTEC) 10 MG tablet Take 10 mg by mouth daily.  . diphenhydramine-acetaminophen (TYLENOL PM) 25-500 MG TABS Take 1 tablet by mouth at bedtime as needed (for sleep).   . fluticasone (FLONASE) 50 MCG/ACT nasal spray USE 2 SPRAYS IN  EACH NOSTRIL DAILY  . gabapentin (NEURONTIN) 300 MG capsule Take 300 mg by mouth every other day.   . nitroGLYCERIN (NITROSTAT) 0.4 MG SL tablet Place 0.4 mg under the tongue every 5 (five) minutes as needed.  . pantoprazole (PROTONIX)  40 MG tablet TAKE ONE (1) TABLET EACH DAY  . psyllium (METAMUCIL) 58.6 % powder Take 1 packet by mouth daily.   . tamsulosin (FLOMAX) 0.4 MG CAPS capsule Take 0.4 mg by mouth daily after supper.  . vitamin C (ASCORBIC ACID) 500 MG tablet Take 500 mg by mouth daily.  [name] warfarin (COUMADIN) 2.5 MG tablet Take 2.5 mg by mouth daily.  . [DISCONTINUED] eplerenone (INSPRA) 25 MG tablet Take 1 tablet (25 mg total) by mouth daily. Please keep upcoming appt in October for future refills. Thank you  . [DISCONTINUED] furosemide (LASIX) 20 MG tablet Take 1 tablet (20 mg total) by mouth daily.  . [DISCONTINUED] metoprolol succinate (TOPROL-XL) 25 MG 24 hr tablet Take 1 tablet (25 mg total) by mouth daily. Please keep upcoming appt in October before anymore refills. Thank you  . [DISCONTINUED] warfarin (COUMADIN) 5 MG tablet TAKE ONE TABLET DAILY AS DIRECTED  . azithromycin (ZITHROMAX) 250 MG tablet Take as directed  . methylPREDNISolone (MEDROL DOSEPAK) 4 MG TBPK tablet 6 day pak-take as directed   No facility-administered encounter medications on file as of 06/22/2018.     Allergies  Allergen Reactions  . Celecoxib Rash  . Lisinopril Cough  . Simvastatin Other (See Comments)    Leg pain    Immunization History  Administered Date(s) Administered  . Influenza, High Dose Seasonal PF 05/01/2016, 04/08/2017, 04/22/2018  . Influenza-Unspecified 06/04/2015  . Pneumococcal Conjugate-13 03/28/2015  . Tdap 05/17/2012  . Zoster 11/04/2005    Current Medications, Allergies, Past Medical History, Past Surgical History, Family History, and Social History were reviewed in [employer] record.   Review of Systems             All symptoms NEG except  where BOLDED >>  Constitutional:  F/C/S, fatigue, anorexia, unexpected weight change. HEENT:  HA, visual changes, hearing loss, earache, nasal symptoms, sore throat, mouth sores, hoarseness. Resp:  cough, sputum, hemoptysis; SOB, tightness, wheezing. Cardio:  CP, palpit, DOE, orthopnea, edema. GI:  N/V/D/C, blood in stool; reflux, abd pain, distention, gas. GU:  dysuria, freq, urgency, hematuria, flank pain, voiding difficulty. MS:  joint pain, swelling, tenderness, decr ROM; neck pain, back pain, etc. Neuro:  HA, tremors, seizures, dizziness, syncope, weakness, numbness, gait abn. Skin:  suspicious lesions or skin rash. Heme:  adenopathy, bruising, bleeding. Psyche:  confusion, agitation, sleep disturbance, hallucinations, anxiety, depression suicidal.   Objective:   Physical Exam         Vital Signs:  Reviewed...   General:  WD, WN, 77 y/o WM in NAD; alert & oriented; pleasant & cooperative... HEENT:  [place]/AT; Conjunctiva- pink, Sclera- nonicteric, EOM-wnl, PERRLA, EACs-clear, TMs-wnl; NOSE-clear; THROAT-clear & wnl.  Neck:  Supple w/ fair ROM; no JVD; normal carotid impulses w/o bruits; no thyromegaly or nodules palpated; no lymphadenopathy.  Chest:  Clear to P & A; without wheezes, rales, or rhonchi heard. Heart:  Irregular rhythm (AFIB); norm S1 & S2 without murmurs, rubs, or gallops detected. Abdomen:  Soft & nontender- no guarding or rebound; normal bowel sounds; no organomegaly or masses palpated. Ext:  Decr ROM; without deformities +arthritic changes; no varicose veins, +venous insuffic, tr edema;  Pulses intact w/o bruits. Neuro:  CNs II-XII intact; motor testing normal; sensory testing normal; gait normal & balance OK. Derm:  No lesions noted; no rash etc. Lymph:  No cervical, supraclavicular, axillary, or inguinal adenopathy palpated.   Assessment:      IMP>>        DOE>  likely multifactorial w/ components from lung/ heart/ deconditioning...      AR, AB> stable on  AlbutHFA as needed + Flonase/ Zyrtek...       AbnSpirometry> it appears more restricted than obstructed, need to correlate to current imaging studies...      Ex-smoker> he quit in 1991 at age 79 w/ ~35 pack-yr smoking hx...      Cardiac issues> followed by DrHSmith-- HBP, CAD- s/p stent, AFib on coumadin...      Medical issues> followed by DrFisher-- HBP, HL, kidney stones/ BPH, DJD/ LBP  PLAN>>       We need some further assessment on Keyontay's lungs w/ a current CXR, review blood work, and Full PFTs;  He does not desat w/ exercise;  The most important intervention at this point is to increase his exercise program- [facility], silver sneakers, or similar group;  We are holding off on additional meds until we can see this data...  11/06/16>   Everrett has persistent DOE and a mild cough- again these are most likely multifactorial in etiology w/ components from pulm/ cardiac/ deconditioning as noted; he is also on an ACE & although he quit smoking in 1991, he has been way too sedentary w/o exercise program; Labs are all WNL x borderline RAST pos to grasses... REC to STOP Lisinopril, monitor BP at home to see if additional med needed or not, deep breathing exercises... we plan rov recheck in 6mo 02/09/17>   JOzellis reminded to stop the Lisinopril completely & monitor his BP & symptoms going forward; hopefully his resp symptoms will resolve off the ACE + continued allergy rx per DrSharma;  He needs to maintain & push his exercise program. 12/18/17>   JAlfiehas acute bronchitis w/ an AB exac=> we discussed Rx w/ Levaquin & a tapering course of PRED; he will also benefit from ProairHFA for prn use & the addition of MCINEX as a mucolytic... We plan an rov recheck in 3-4 weeks to assess the need for any long acting inhalers. 12/29/17>   JUrielis an ex-smoker w/ AB, AR, multifactorial dyspnea;  Asked to finish the Pred'10mg'$  Qod til gone, & use the albutHFA inhaler as needed; we reviewed need for regular exercise, we plan  rov recheck in 3-483mo8/26/19>   Diagnosed w/ Espoh Ca- treated w/ chemoradiation & improved;  On reviewed JeLoringad lost 20# but gained back 11# w/ boost & improved appetite, but he has 2+ edema today=> rec adding LASIX20, restrict sodium, support hose, etc... We plan recheck in 6-8 weeks 06/22/18>   JeAyuubas a mild URI and so far no major AB exac- we will treat w/ ZPak & Medrol dosepak;  He will need continue Pulm follow up for his AB and the 54m37mUL nodule found on CT Chest 01/2018=> we will arrange for an appt w/ DrByrum in 3-54mo554moI will be retiring in Jan2020.   Plan:     Patient's Medications  New Prescriptions   AZITHROMYCIN (ZITHROMAX) 250 MG TABLET    Take as directed   METHYLPREDNISOLONE (MEDROL DOSEPAK) 4 MG TBPK TABLET    6 day pak-take as directed  Previous Medications   ALBUTEROL (PROAIR HFA) 108 (90 BASE) MCG/ACT INHALER    Inhale 1-2 puffs into the lungs every 6 (six) hours as needed for wheezing or shortness of breath.   CETIRIZINE (ZYRTEC) 10 MG TABLET    Take 10 mg by mouth daily.   DIPHENHYDRAMINE-ACETAMINOPHEN (TYLENOL PM) 25-500 MG  TABS    Take 1 tablet by mouth at bedtime as needed (for sleep).    FLUTICASONE (FLONASE) 50 MCG/ACT NASAL SPRAY    USE 2 SPRAYS IN EACH NOSTRIL DAILY   GABAPENTIN (NEURONTIN) 300 MG CAPSULE    Take 300 mg by mouth every other day.    NITROGLYCERIN (NITROSTAT) 0.4 MG SL TABLET    Place 0.4 mg under the tongue every 5 (five) minutes as needed.   PANTOPRAZOLE (PROTONIX) 40 MG TABLET    TAKE ONE (1) TABLET EACH DAY   PSYLLIUM (METAMUCIL) 58.6 % POWDER    Take 1 packet by mouth daily.    TAMSULOSIN (FLOMAX) 0.4 MG CAPS CAPSULE    Take 0.4 mg by mouth daily after supper.   VITAMIN C (ASCORBIC ACID) 500 MG TABLET    Take 500 mg by mouth daily.   WARFARIN (COUMADIN) 2.5 MG TABLET    Take 2.5 mg by mouth daily.  Modified Medications   Modified Medication Previous Medication   EPLERENONE (INSPRA) 25 MG TABLET eplerenone (INSPRA) 25 MG tablet       TAKE ONE (1) TABLET EACH DAY    Take 1 tablet (25 mg total) by mouth daily. Please keep upcoming appt in October for future refills. Thank you   METOPROLOL SUCCINATE (TOPROL-XL) 25 MG 24 HR TABLET metoprolol succinate (TOPROL-XL) 25 MG 24 hr tablet      TAKE ONE (1) TABLET EACH DAY    Take 1 tablet (25 mg total) by mouth daily. Please keep upcoming appt in October before anymore refills. Thank you  Discontinued Medications   FUROSEMIDE (LASIX) 20 MG TABLET    Take 1 tablet (20 mg total) by mouth daily.   WARFARIN (COUMADIN) 5 MG TABLET    TAKE ONE TABLET DAILY AS DIRECTED

## 2018-06-28 DIAGNOSIS — Z1283 Encounter for screening for malignant neoplasm of skin: Secondary | ICD-10-CM | POA: Diagnosis not present

## 2018-06-28 DIAGNOSIS — D485 Neoplasm of uncertain behavior of skin: Secondary | ICD-10-CM | POA: Diagnosis not present

## 2018-06-28 DIAGNOSIS — D2230 Melanocytic nevi of unspecified part of face: Secondary | ICD-10-CM | POA: Diagnosis not present

## 2018-06-28 DIAGNOSIS — L821 Other seborrheic keratosis: Secondary | ICD-10-CM | POA: Diagnosis not present

## 2018-06-28 DIAGNOSIS — I788 Other diseases of capillaries: Secondary | ICD-10-CM | POA: Diagnosis not present

## 2018-06-28 DIAGNOSIS — L570 Actinic keratosis: Secondary | ICD-10-CM | POA: Diagnosis not present

## 2018-06-28 DIAGNOSIS — L578 Other skin changes due to chronic exposure to nonionizing radiation: Secondary | ICD-10-CM | POA: Diagnosis not present

## 2018-06-28 DIAGNOSIS — D229 Melanocytic nevi, unspecified: Secondary | ICD-10-CM | POA: Diagnosis not present

## 2018-06-28 DIAGNOSIS — L719 Rosacea, unspecified: Secondary | ICD-10-CM | POA: Diagnosis not present

## 2018-06-28 DIAGNOSIS — D225 Melanocytic nevi of trunk: Secondary | ICD-10-CM | POA: Diagnosis not present

## 2018-06-28 DIAGNOSIS — Z85828 Personal history of other malignant neoplasm of skin: Secondary | ICD-10-CM | POA: Diagnosis not present

## 2018-06-28 DIAGNOSIS — L812 Freckles: Secondary | ICD-10-CM | POA: Diagnosis not present

## 2018-07-08 ENCOUNTER — Ambulatory Visit (INDEPENDENT_AMBULATORY_CARE_PROVIDER_SITE_OTHER): Payer: Medicare Other | Admitting: *Deleted

## 2018-07-08 DIAGNOSIS — I480 Paroxysmal atrial fibrillation: Secondary | ICD-10-CM

## 2018-07-08 DIAGNOSIS — Z5181 Encounter for therapeutic drug level monitoring: Secondary | ICD-10-CM

## 2018-07-08 DIAGNOSIS — I4891 Unspecified atrial fibrillation: Secondary | ICD-10-CM

## 2018-07-08 LAB — POCT INR: INR: 3.4 — AB (ref 2.0–3.0)

## 2018-07-08 NOTE — Patient Instructions (Signed)
Description   Do not take any Coumadin today then continue same dosage of coumadin 1/2 tablet daily. Recheck INR in 2 weeks. Coumadin clinic # [phone]. Main # [phone].

## 2018-07-12 ENCOUNTER — Ambulatory Visit: Payer: Medicare Other | Admitting: Interventional Cardiology

## 2018-07-19 ENCOUNTER — Inpatient Hospital Stay: Payer: Medicare Other | Attending: Oncology | Admitting: Oncology

## 2018-07-19 VITALS — BP 136/78 | HR 60 | Temp 97.5°F | Resp 18 | Ht 67.0 in | Wt 178.7 lb

## 2018-07-19 DIAGNOSIS — Z923 Personal history of irradiation: Secondary | ICD-10-CM | POA: Diagnosis not present

## 2018-07-19 DIAGNOSIS — C160 Malignant neoplasm of cardia: Secondary | ICD-10-CM

## 2018-07-19 DIAGNOSIS — Z87891 Personal history of nicotine dependence: Secondary | ICD-10-CM | POA: Insufficient documentation

## 2018-07-19 DIAGNOSIS — Z9221 Personal history of antineoplastic chemotherapy: Secondary | ICD-10-CM | POA: Diagnosis not present

## 2018-07-19 DIAGNOSIS — I2510 Atherosclerotic heart disease of native coronary artery without angina pectoris: Secondary | ICD-10-CM | POA: Diagnosis not present

## 2018-07-19 DIAGNOSIS — Z7901 Long term (current) use of anticoagulants: Secondary | ICD-10-CM | POA: Diagnosis not present

## 2018-07-19 DIAGNOSIS — I4891 Unspecified atrial fibrillation: Secondary | ICD-10-CM | POA: Diagnosis not present

## 2018-07-19 NOTE — Progress Notes (Signed)
  [facility] OFFICE PROGRESS NOTE   Diagnosis: Gastroesophageal cancer  INTERVAL HISTORY:   Larry Bautista returns as scheduled.  He feels well.  No dysphasia.  No bleeding.  He saw Dr. Servando Snare in August to consider an esophagogastrectomy.  Larry Bautista has decided against surgery.  Objective:  Vital signs in last 24 hours:  Blood pressure 136/78, pulse 60, resp. rate 18, height 5\' 7"  (1.702 m), weight 178 lb 11.2 oz (81.1 kg), SpO2 97 %.    HEENT: Neck without mass Lymphatics: No cervical, supraclavicular, or axillary nodes Resp: End inspiratory rhonchi at the posterior base bilaterally, no respiratory distress Cardio: Irregular GI: No hepatosplenomegaly, nontender, no mass Vascular: Trace edema at the low leg bilaterally   Lab Results:  Lab Results  Component Value Date   WBC 4.9 03/25/2018   HGB 13.1 03/25/2018   HCT 39.2 03/25/2018   MCV 94.4 03/25/2018   PLT 140 03/25/2018   NEUTROABS 3.6 03/25/2018    CMP  Lab Results  Component Value Date   NA 143 03/03/2018   K 3.7 03/03/2018   CL 108 03/03/2018   CO2 26 03/03/2018   GLUCOSE 107 (H) 03/03/2018   BUN 12 03/03/2018   CREATININE 0.78 03/03/2018   CALCIUM 9.1 03/03/2018   PROT 6.0 (L) 03/03/2018   ALBUMIN 3.8 03/03/2018   AST 17 03/03/2018   ALT 15 03/03/2018   ALKPHOS 44 03/03/2018   BILITOT 0.6 03/03/2018   GFRNONAA >60 03/03/2018   GFRAA >60 03/03/2018    Lab Results  Component Value Date   CEA1 <1.00 01/19/2018    Lab Results  Component Value Date   INR 3.4 (A) 07/08/2018    Medications: I have reviewed the patient's current medications.   Assessment/Plan: 1. Adenocarcinoma of the GE junction ? Partially obstructing mass noted at the GE junction on endoscopy 01/07/2018, nonobstructing ? CTs 01/14/2018, sub-solid 5 mm right upper lobe nodule, mild eccentric wall thickening at the GE junction ? PET scan 01/27/2018-hypermetabolism at the GE junction, no evidence of metastatic  disease ? Initiation of radiation and weekly Taxol/carboplatin 02/03/2018 ? Week 2 Taxol/carboplatin 02/10/2018 ? Week 3 Taxol/carboplatin 02/17/2018 ? Week 4 Taxol/carboplatin 02/24/2018 ? Week 5 Taxol/carboplatin 03/03/2018 ? Radiation completed 03/17/2018 ? CTs 05/06/2018- unchanged right upper lobe nodule, esophagus appears normal 2. GI bleeding secondary to #1 3. Atrial fibrillation-maintained on Coumadin prior to hospital admission 01/07/2018 4. Coronary artery disease 5. Chronic exertional dyspnea-likely COPD 6. History of tobacco use 7. History of kidney stones 8. Focus of hypermetabolism at the right prostate on the PET scan 01/27/2018, PSA normal on 02/10/2018 9. History of thrombocytopenia secondary to chemotherapy 10. Odynophagia secondary to chemotherapy/radiation-improved 11. Rash-most likely secondary to radiation, though the rash does in an atypical distribution;  resolved    Disposition: Larry Bautista is in clinical remission from esophagus cancer.  He is scheduled to see Dr. Servando Snare 07/29/2018.  I do not recommend surveillance imaging or a repeat endoscopy unless he decides to proceed with surgery.  I would recommend a repeat endoscopy and staging scans to confirm the presence of localized disease if he has surgery.  Larry Bautista indicated today he does not wish to consider surgery.  He will return for an office visit in 3 months.  15 minutes were spent with the patient today.  The majority of the time was used for counseling and coordination of care.  Betsy Coder, MD  07/19/2018  3:20 PM

## 2018-07-20 ENCOUNTER — Other Ambulatory Visit: Payer: Self-pay | Admitting: *Deleted

## 2018-07-21 ENCOUNTER — Ambulatory Visit (INDEPENDENT_AMBULATORY_CARE_PROVIDER_SITE_OTHER): Payer: Medicare Other | Admitting: *Deleted

## 2018-07-21 DIAGNOSIS — I4891 Unspecified atrial fibrillation: Secondary | ICD-10-CM | POA: Diagnosis not present

## 2018-07-21 DIAGNOSIS — I480 Paroxysmal atrial fibrillation: Secondary | ICD-10-CM

## 2018-07-21 DIAGNOSIS — N401 Enlarged prostate with lower urinary tract symptoms: Secondary | ICD-10-CM | POA: Diagnosis not present

## 2018-07-21 DIAGNOSIS — Z5181 Encounter for therapeutic drug level monitoring: Secondary | ICD-10-CM | POA: Diagnosis not present

## 2018-07-21 DIAGNOSIS — R3911 Hesitancy of micturition: Secondary | ICD-10-CM | POA: Diagnosis not present

## 2018-07-21 LAB — POCT INR: INR: 2.7 (ref 2.0–3.0)

## 2018-07-21 NOTE — Patient Instructions (Signed)
Description   Continue same dosage of coumadin 1/2 tablet daily. Recheck INR in 3 weeks. Coumadin clinic # [phone]. Main # [phone].

## 2018-07-29 ENCOUNTER — Encounter: Payer: Medicare Other | Admitting: Cardiothoracic Surgery

## 2018-08-10 ENCOUNTER — Ambulatory Visit: Payer: Medicare Other | Admitting: Pulmonary Disease

## 2018-08-11 ENCOUNTER — Ambulatory Visit (INDEPENDENT_AMBULATORY_CARE_PROVIDER_SITE_OTHER): Payer: Medicare Other | Admitting: Pharmacist

## 2018-08-11 DIAGNOSIS — I4891 Unspecified atrial fibrillation: Secondary | ICD-10-CM

## 2018-08-11 DIAGNOSIS — Z5181 Encounter for therapeutic drug level monitoring: Secondary | ICD-10-CM

## 2018-08-11 DIAGNOSIS — I480 Paroxysmal atrial fibrillation: Secondary | ICD-10-CM | POA: Diagnosis not present

## 2018-08-11 LAB — POCT INR: INR: 3.1 — AB (ref 2.0–3.0)

## 2018-08-11 NOTE — Patient Instructions (Signed)
Description   Eat a serving of greens today, then continue same dosage of coumadin 1/2 tablet daily. Recheck INR in 3 weeks. Coumadin clinic # [phone]. Main # [phone].

## 2018-08-21 IMAGING — CT CT CHEST W/ CM
2 of 5 series · 12 of 36 positions shown, 15 images · IV contrast (OMNIPAQUE)
Comparison: 12/08/2017 chest radiograph. 07/11/2015 CT
abdomen/pelvis.

CLINICAL DATA: New diagnosis of nonobstructing gastroesophageal
junction adenocarcinoma on 01/07/2018 upper endoscopy.

EXAM:
CT CHEST, ABDOMEN, AND PELVIS WITH CONTRAST
TECHNIQUE: Multidetector CT imaging of the chest, abdomen and pelvis was
performed following the standard protocol during bolus
administration of intravenous contrast.
CONTRAST:  100mL OMNIPAQUE IOHEXOL 300 MG/ML  SOLN

[Series 2: cap with · axial · 0.88mm/px · z∈[-635,-95]mm · 9 of 134 slices shown, 12 images]
[im 13/134  mediastinal]
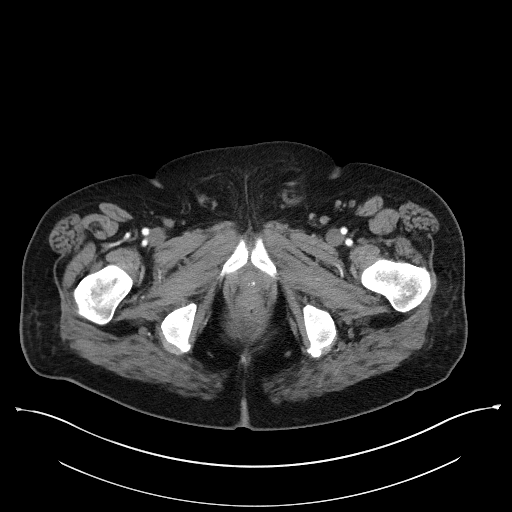
[im 13/134  lung]
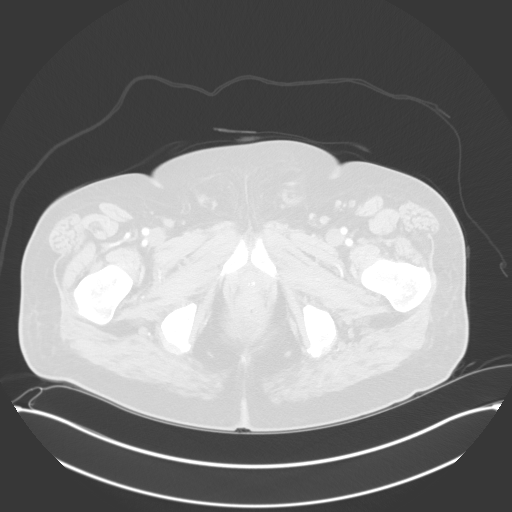
[im 25/134  lung]
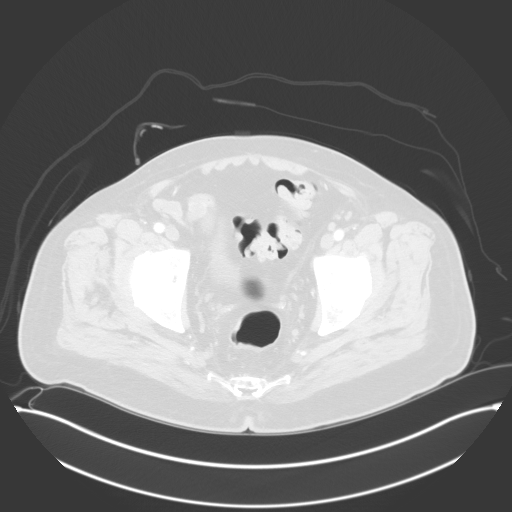
[im 37/134  lung]
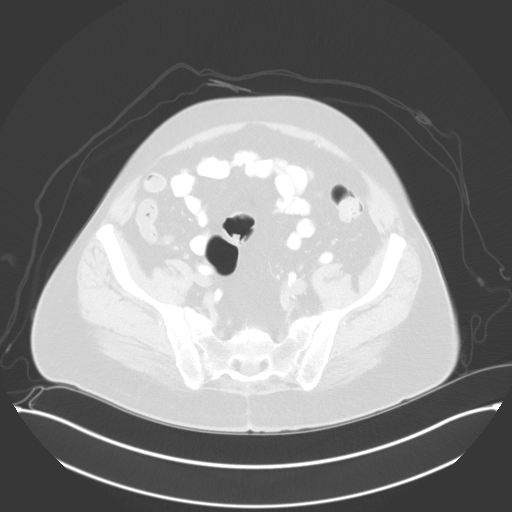
[im 49/134  lung]
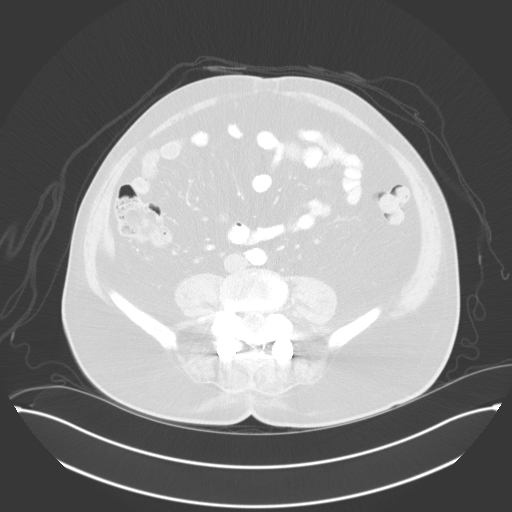
[im 73/134  mediastinal]
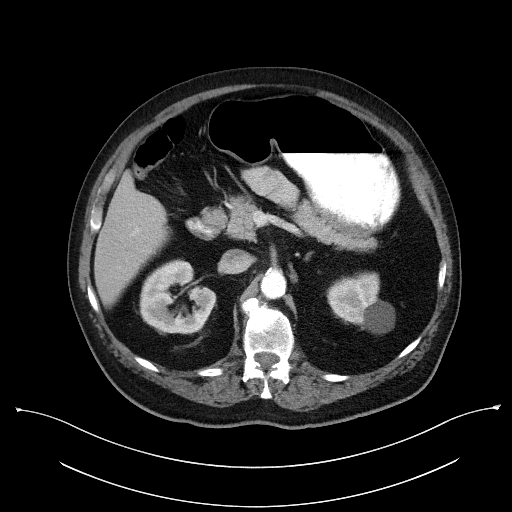
[im 73/134  lung]
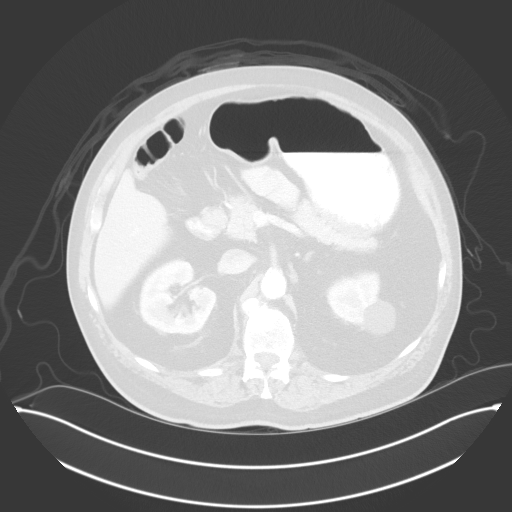
[im 85/134  lung]
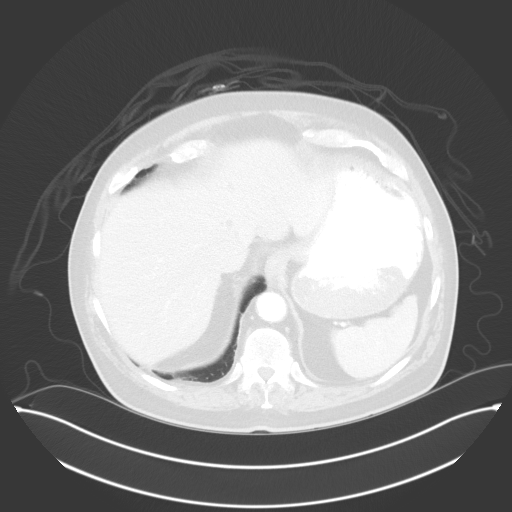
[im 97/134  lung]
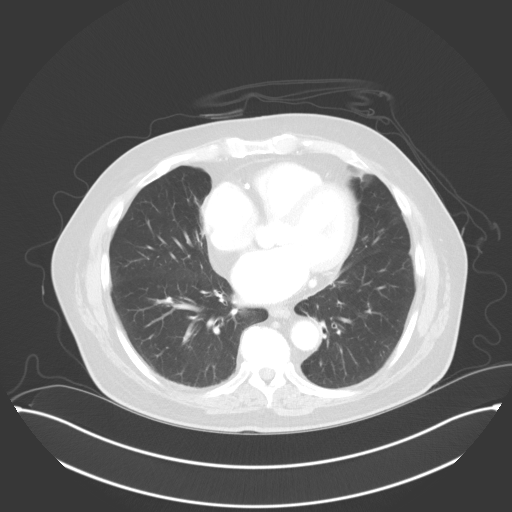
[im 109/134  lung]
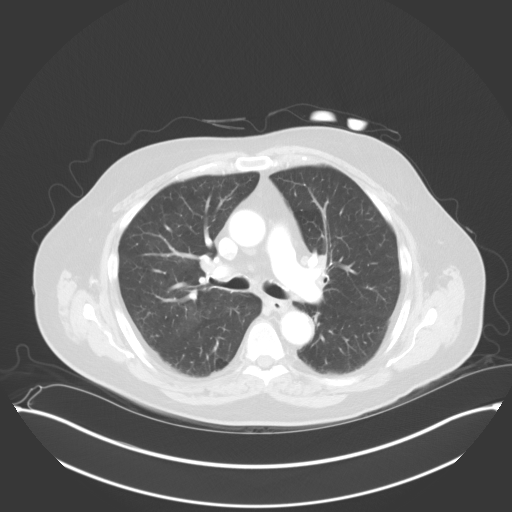
[im 121/134  mediastinal]
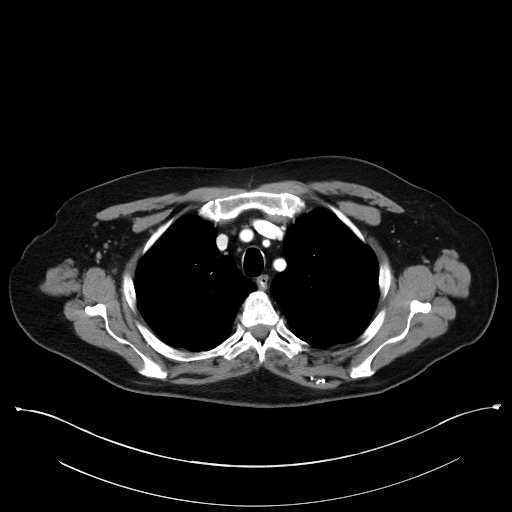
[im 121/134  lung]
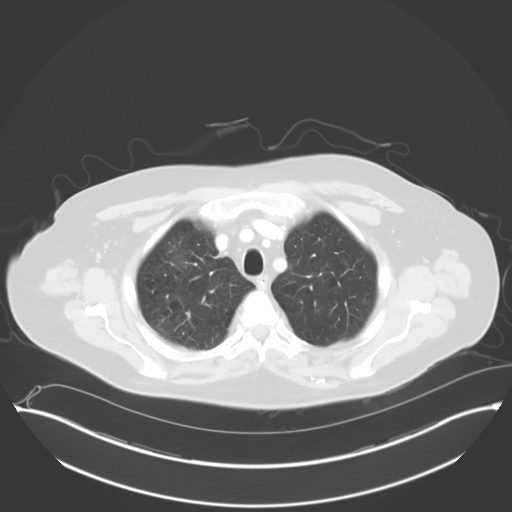

[Series 5: coronals · coronal · 0.82mm/px · 3 of 173 slices shown]
[im 35/173  lung]
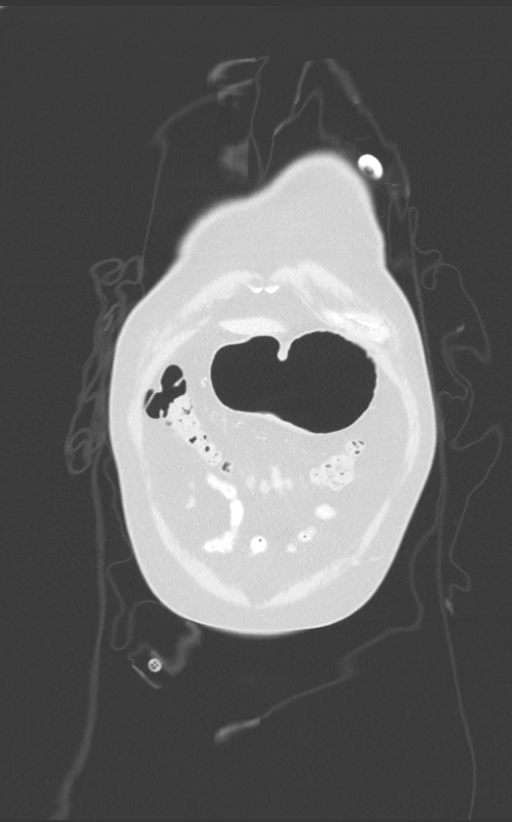
[im 69/173  lung]
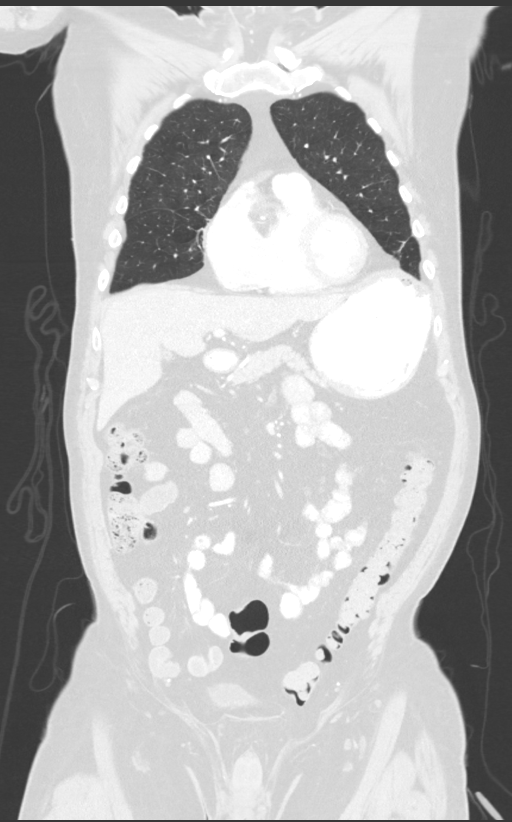
[im 104/173  lung]
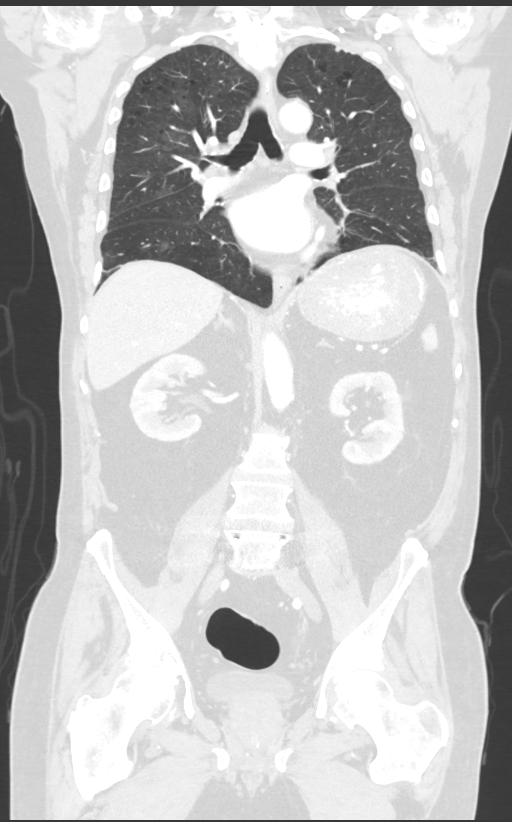

[12 of 36 positions shown; findings below may reference images not displayed]

FINDINGS: CT CHEST FINDINGS

Cardiovascular: Normal heart size. No significant pericardial
effusion/thickening. Left main and 3 vessel coronary
atherosclerosis. Atherosclerotic nonaneurysmal thoracic aorta.
Normal caliber pulmonary arteries. No central pulmonary emboli.

Mediastinum/Nodes: No discrete thyroid nodules. There is mild
eccentric wall thickening posteriorly at the esophagogastric
junction (series 2/image 52), without a discrete measurable mass by
CT. No pathologically enlarged axillary, mediastinal or hilar lymph
nodes.

Lungs/Pleura: No pneumothorax. No pleural effusion. Moderate
centrilobular emphysema with diffuse bronchial wall thickening. Sub
solid right upper lobe 5 mm pulmonary nodule (series 4/image 36). No
acute consolidative airspace disease, lung masses or additional
significant pulmonary nodules. A few scattered small parenchymal
bands at the lung bases, compatible with mild
postinfectious/postinflammatory scarring.

Musculoskeletal: No aggressive appearing focal osseous lesions.
Right shoulder joint effusion/bursal collection anterior to the
right scapula (series 2/image 6). Asymmetric advanced right shoulder
osteoarthritis. Marked thoracic spondylosis.

CT ABDOMEN PELVIS FINDINGS

Hepatobiliary: Normal liver size. Subcentimeter hypodense left liver
lobe lesions are too small to characterize and are stable since
07/11/2015 CT, considered benign. No new liver lesions. Normal
gallbladder with no radiopaque cholelithiasis. No biliary ductal
dilatation.

Pancreas: Normal, with no mass or duct dilation.

Spleen: Normal size. No mass.

Adrenals/Urinary Tract: Normal adrenals. No hydronephrosis.
Nonobstructing 5 mm lower right renal stone. Small simple renal
cysts in both kidneys, largest 2.9 cm in the lateral upper left
kidney. Additional subcentimeter hypodense renal cortical lesions in
both kidneys are too small to characterize and require no follow-up.
Normal bladder.

Stomach/Bowel: Mild eccentric wall thickening posteriorly at the
esophagogastric junction (series 2/image 52), without a discrete
measurable mass by CT. Otherwise normal stomach. Normal caliber
small bowel with no small bowel wall thickening. Normal appendix.
Normal large bowel with no diverticulosis, large bowel wall
thickening or pericolonic fat stranding.

Vascular/Lymphatic: Atherosclerotic nonaneurysmal abdominal aorta.
Patent portal, splenic, hepatic and renal veins. No pathologically
enlarged lymph nodes in the abdomen or pelvis.

Reproductive: Mildly enlarged prostate with nonspecific internal
prostatic calcifications.

Other: No pneumoperitoneum, ascites or focal fluid collection. Small
fat containing left inguinal hernia, stable.

Musculoskeletal: No aggressive appearing focal osseous lesions.
Marked lumbar spondylosis. Postsurgical changes from bilateral
posterior spinal fusion at L5-S1.
IMPRESSION: 1. Mild eccentric wall thickening posteriorly at the esophagogastric
junction, without a discrete measurable mass by CT, compatible with
the provided history of esophagogastric junction malignancy.
2. No locoregional adenopathy. No definite distant metastatic
disease.
3. Solitary subsolid 5 mm right upper lobe pulmonary nodule.
Recommend attention on follow-up chest CT in 3-6 months.
4. Chronic findings include: Aortic Atherosclerosis ([id number].M)
and Emphysema ([id number]). Left main and 3 vessel coronary
atherosclerosis. Nonobstructing right nephrolithiasis. Mildly
enlarged prostate. Stable small fat containing left inguinal hernia.

## 2018-08-26 ENCOUNTER — Encounter: Payer: Medicare Other | Admitting: Cardiothoracic Surgery

## 2018-09-06 ENCOUNTER — Ambulatory Visit (INDEPENDENT_AMBULATORY_CARE_PROVIDER_SITE_OTHER): Payer: Medicare Other

## 2018-09-06 DIAGNOSIS — Z5181 Encounter for therapeutic drug level monitoring: Secondary | ICD-10-CM | POA: Diagnosis not present

## 2018-09-06 DIAGNOSIS — I4891 Unspecified atrial fibrillation: Secondary | ICD-10-CM

## 2018-09-06 LAB — POCT INR: INR: 2.7 (ref 2.0–3.0)

## 2018-09-06 NOTE — Patient Instructions (Signed)
Description   Continue same dosage of coumadin 1/2 tablet daily. Recheck INR in 4 weeks. Coumadin clinic # [phone]. Main # [phone].

## 2018-09-14 ENCOUNTER — Other Ambulatory Visit: Payer: Self-pay | Admitting: Oncology

## 2018-09-16 ENCOUNTER — Ambulatory Visit (INDEPENDENT_AMBULATORY_CARE_PROVIDER_SITE_OTHER): Payer: Medicare Other | Admitting: Cardiothoracic Surgery

## 2018-09-16 ENCOUNTER — Encounter: Payer: Self-pay | Admitting: Cardiothoracic Surgery

## 2018-09-16 ENCOUNTER — Other Ambulatory Visit: Payer: Self-pay

## 2018-09-16 VITALS — BP 128/80 | HR 47 | Resp 18 | Ht 67.0 in | Wt 181.0 lb

## 2018-09-16 DIAGNOSIS — C160 Malignant neoplasm of cardia: Secondary | ICD-10-CM

## 2018-09-16 NOTE — Progress Notes (Signed)
[address]       [place], [address]             [phone]                    Larry Bautista [place] Medical Record #[id number] Date of Birth: [birth year]  Referring: Ladell Pier, MD Primary Care: [name], MD Primary Cardiologist: No primary care provider on file.  Chief Complaint:    Chief Complaint  Patient presents with  . Esophageal Cancer    3 month f/u    History of Present Illness:    Larry Bautista 78 y.o. male is seen in the office  today for follow-up of his distal esophageal adenocarcinoma.    The patient has had no previous history of esophageal problems or GI bleed.  He noted several months of increasing weakness and fatigue.  He denied any weight loss.  He noted increasing episodes of wheezing along with the fatigue and thought his underlying COPD was worse.  3 days prior to admission he developed black stools.  He then on the day of admission became very weak nauseated and vomited vomited bright red blood.   The patient had been on Coumadin for6 years for chronic atrial fibrillation after admission his Coumadin was reversed endoscopy showed a distal esophageal mass pathology showed adenocarcinoma CT scan of the chest and abdomen was performed.  The patient was given 2 units of packed red blood cells.    He has been treated with Initiation of radiation and weekly Taxol/carboplatin 02/03/2018 Week 2 Taxol/carboplatin 02/10/2018 Week 3 Taxol/carboplatin 02/17/2018 Week 4 Taxol/carboplatin 02/24/2018 Week 5 Taxol/carboplatin 03/03/2018 Radiation completed 03/17/2018  Patient is able to take a p.o. diet carefully.  He notes that he remains weak but is slowly gaining strength.  He is noted no further GI bleeding  Previously seen patient with still very fatigued from his radiation and chemotherapy treatments and was not interested in proceeding with surgery he notes that he does feel better now but still is weak.  Once again does  not wish to consider surgical resection   Current Activity/ Functional Status:  Patient is independent with mobility/ambulation, transfers, ADL's, IADL's.   Zubrod Score: At the time of surgery this patient's most appropriate activity status/level should be described as: []     0    Normal activity, no symptoms []     1    Restricted in physical strenuous activity but ambulatory, able to do out light work [x]     2    Ambulatory and capable of self care, unable to do work activities, up and about               >50 % of waking hours                              []     3    Only limited self care, in bed greater than 50% of waking hours []     4    Completely disabled, no self care, confined to bed or chair []     5    Moribund   Past Medical History:  Diagnosis Date  . Allergy   . CHF (congestive heart failure) ([place])   . Chronic knee pain   . Coronary artery disease    with LAD DE stent 2004  . Heel spur, left   .  Hyperlipidemia   . Hypertension   . Kidney stones   . Lower back pain    status post surgery for spondylolisthesis  . Migraine   . Persistent atrial fibrillation    longstanding persistent (since 05/2008)  . Plantar fasciitis     Past Surgical History:  Procedure Laterality Date  . BACK SURGERY     spondylolisthesis  . CARDIAC CATHETERIZATION  03/06/2003  . CORONARY ANGIOPLASTY WITH STENT PLACEMENT  04/2003   CYPHER stent implantation in the LAD  . ESOPHAGOGASTRODUODENOSCOPY (EGD) WITH PROPOFOL N/A 01/07/2018   Procedure: ESOPHAGOGASTRODUODENOSCOPY (EGD) WITH PROPOFOL;  Surgeon: Wonda Horner, MD;  Location: [facility] ENDOSCOPY;  Service: Endoscopy;  Laterality: N/A;  . HAND SURGERY Right 2014   carpal tunnel  . SKIN CANCER EXCISION  1980's   Face  . VASCULAR SURGERY     Vascular Stent    Family History  Problem Relation Age of Onset  . Heart attack Father   . Colon cancer Father   . Congestive Heart Failure Father   . Atrial fibrillation Brother   . Stroke  Brother   . Bladder Cancer Brother   . Lung cancer Mother   . Atrial fibrillation Brother   . COPD Brother   . Congestive Heart Failure Brother   . Lung cancer Brother   . Migraines Daughter    Family history significant for both his brother and mother who died of lung cancer, father died of colon cancer one sister had a neurological degenerative disease he has one daughter and one stepdaughter  Social History   Tobacco Use  Smoking Status Former Research scientist (life sciences)  . Packs/day: 2.00  . Years: 35.00  . Pack years: 70.00  . Types: Cigarettes  . Start date: 09/08/1954  . Last attempt to quit: 11/06/1989  . Years since quitting: 28.8  Smokeless Tobacco Never Used  Tobacco Comment   Smoked from [phone] (35 years)    Social History   Substance and Sexual Activity  Alcohol Use Not Currently  . Alcohol/week: 2.0 standard drinks  . Types: 2 Standard drinks or equivalent per week   Comment: 1961-2009 (Last drink 2009)      Allergies  Allergen Reactions  . Celecoxib Rash  . Lisinopril Cough  . Simvastatin Other (See Comments)    Leg pain    Current Outpatient Medications  Medication Sig Dispense Refill  . albuterol (PROAIR HFA) 108 (90 Base) MCG/ACT inhaler Inhale 1-2 puffs into the lungs every 6 (six) hours as needed for wheezing or shortness of breath. 1 Inhaler prn  . cetirizine (ZYRTEC) 10 MG tablet Take 10 mg by mouth daily.    . diphenhydramine-acetaminophen (TYLENOL PM) 25-500 MG TABS Take 1 tablet by mouth at bedtime as needed (for sleep).     [name] eplerenone (INSPRA) 25 MG tablet TAKE ONE (1) TABLET EACH DAY 90 tablet 3  . fluticasone (FLONASE) 50 MCG/ACT nasal spray USE 2 SPRAYS IN EACH NOSTRIL DAILY 48 g 3  . gabapentin (NEURONTIN) 300 MG capsule Take 300 mg by mouth every other day.     . metoprolol succinate (TOPROL-XL) 25 MG 24 hr tablet TAKE ONE (1) TABLET EACH DAY 90 tablet 3  . nitroGLYCERIN (NITROSTAT) 0.4 MG SL tablet Place 0.4 mg under the tongue every 5 (five) minutes  as needed.    . pantoprazole (PROTONIX) 40 MG tablet TAKE ONE (1) TABLET EACH DAY 90 tablet 0  . psyllium (METAMUCIL) 58.6 % powder Take 1 packet by mouth daily.     [name]  tamsulosin (FLOMAX) 0.4 MG CAPS capsule Take 0.4 mg by mouth daily after supper.    . vitamin C (ASCORBIC ACID) 500 MG tablet Take 500 mg by mouth daily.    [name] warfarin (COUMADIN) 2.5 MG tablet Take 2.5 mg by mouth daily.     No current facility-administered medications for this visit.     Pertinent items are noted in HPI.   Review of Systems:     Cardiac Review of Systems: [Y] = yes  or   [ N ] = no   Chest Pain [  n]  Resting SOB Blue.Reese  ] Exertional SOB  Blue.Reese ]  Orthopnea Florencio.Farrier  ]   Pedal Edema [ y  ]    Palpitations [n] Syncope  [n ]   Presyncope [ y]   General Review of Systems: [Y] = yes [  ]=no Constitional: recent weight change [ y ];  Wt loss over the last 3 months [   ] anorexia [  ]; fatigue Blue.Reese  ]; nausea [  ]; night sweats [  ]; fever [  ]; or chills [  ];           Eye : blurred vision [  ]; diplopia [   ]; vision changes [  ];  Amaurosis fugax[  ]; Resp: cough [  ];  wheezing[  ];  hemoptysis[  ]; shortness of breath[  ]; paroxysmal nocturnal dyspnea[  ]; dyspnea on exertion[  ]; or orthopnea[  ];  GI:  gallstones[  ], vomiting[y  ];  dysphagia[  ]; melena[ y ];  hematochezia Blue.Reese  ]; heartburn[  ];   Hx of  Colonoscopy[  ]; GU: kidney stones [  ]; hematuria[  ];   dysuria [  ];  nocturia[  ];  history of     obstruction [  ]; urinary frequency [ y ]             Skin: rash, swelling[  ];, hair loss[  ];  peripheral edema[  ];  or itching[  ]; Musculosketetal: myalgias[  ];  joint swelling[  ];  joint erythema[  ];  joint pain[ y ];  back pain[ y ];  Heme/Lymph: bruising[ y ];  bleeding[  ];  anemia[  ];  Neuro: TIA[  ];  headaches[  ];  stroke[  ];  vertigo[  ];  seizures[  ];   paresthesias[  ];  difficulty walking[  ];  Psych:depression[  ]; anxiety[  ];  Endocrine: diabetes[  ];  thyroid dysfunction[   ];  Immunizations: Flu up to date Blue.Reese  ]; Pneumococcal up to date [ y ];  Other:    PHYSICAL EXAMINATION: BP 128/80 (BP Location: Right Arm, Patient Position: Sitting, Cuff Size: Normal)   Pulse (!) 47   Resp 18   Ht 5\' 7"  (1.702 m)   Wt 181 lb (82.1 kg)   SpO2 98% Comment: RA  BMI 28.35 kg/m  General appearance: alert, cooperative and appears older than stated age Head: Normocephalic, without obvious abnormality, atraumatic Neck: no adenopathy, no carotid bruit, no JVD, supple, symmetrical, trachea midline and thyroid not enlarged, symmetric, no tenderness/mass/nodules Lymph nodes: Cervical, supraclavicular, and axillary nodes normal. Resp: clear to auscultation bilaterally Cardio: Irregularly irregular  rhythm, S1, S2 normal, no murmur, click, rub or gallop GI: soft, non-tender; bowel sounds normal; no masses,  no organomegaly Extremities: extremities normal, atraumatic, no cyanosis or edema and Homans sign is negative, no sign of  DVT Neurologic: Grossly normal   Wt Readings from Last 3 Encounters:  09/16/18 181 lb (82.1 kg)  07/19/18 178 lb 11.2 oz (81.1 kg)  06/24/18 179 lb 6.4 oz (81.4 kg)   Diagnostic Studies & Laboratory data:  Recent Radiology Findings:  Ct Chest W ContrastCt Abdomen Pelvis W Contrast    Result Date: 05/06/2018 CLINICAL DATA:  Esophageal carcinoma. Post neoadjuvant adjuvant chemotherapy. EXAM: CT CHEST, ABDOMEN, AND PELVIS WITH CONTRAST TECHNIQUE: Multidetector CT imaging of the chest, abdomen and pelvis was performed following the standard protocol during bolus administration of intravenous contrast. CONTRAST:  129mL ISOVUE-300 IOPAMIDOL (ISOVUE-300) INJECTION 61% COMPARISON:  PET-CT scan 01/07/2018 FINDINGS: CT CHEST FINDINGS Cardiovascular: Coronary artery calcification and aortic atherosclerotic calcification. Mediastinum/Nodes: No axillary supraclavicular adenopathy. No mediastinal hilar adenopathy. Esophagus appears normal. No mass or obstruction. No  para esophageal adenopathy. Lungs/Pleura: Ill-defined RIGHT upper lobe nodule (image 37/4) is unchanged. Centrilobular emphysema the upper lobes. New atelectasis at the LEFT lung base. Musculoskeletal: No aggressive osseous lesion. CT ABDOMEN AND PELVIS FINDINGS Hepatobiliary: Two small subcentimeter low-density lesions in the LEFT hepatic lobe not changed from prior most consistent benign cysts. These are not hypermetabolic on comparison PET-CT scan. Gallbladder normal. Pancreas: Pancreas is normal. No ductal dilatation. No pancreatic inflammation. Spleen: Normal spleen Adrenals/urinary tract: Adrenal glands normal. Benign-appearing cysts of the kidneys. Ureters and bladder normal. Stomach/Bowel: Stomach, small bowel, appendix, and cecum are normal. The colon and rectosigmoid colon are normal. Vascular/Lymphatic: Abdominal aorta is normal caliber with atherosclerotic calcification. There is no retroperitoneal or periportal lymphadenopathy. No pelvic lymphadenopathy. Reproductive: Prostate normal Other: No peritoneal metastasis Musculoskeletal: No aggressive osseous lesion. Posterior lumbar fusion IMPRESSION: Chest Impression: 1. No evidence of residual carcinoma in the esophagus. 2. No mediastinal lymphadenopathy. 3. Stable small RIGHT upper lobe nodule. 4. Increased atelectasis in  the LEFT lung base. 5. Coronary artery calcification and Aortic Atherosclerosis (ICD10-I70.0). Abdomen / Pelvis Impression: 1. No evidence of metastatic disease in the abdomen pelvis. 2. Small hypodense lesions in liver consistent with benign cysts. 3. Benign appearing cysts of the kidneys. Electronically Signed   By: Suzy Bouchard M.D.   On: 05/06/2018 17:12     I have independently reviewed the above radiology studies  and reviewed the findings with the patient.   CLINICAL DATA:  Initial treatment strategy for gastroesophageal adenocarcinoma.  EXAM: NUCLEAR MEDICINE PET SKULL BASE TO THIGH  TECHNIQUE: 9.5 mCi F-18  FDG was injected intravenously. Full-ring PET imaging was performed from the skull base to thigh after the radiotracer. CT data was obtained and used for attenuation correction and anatomic localization.  Fasting blood glucose: 96 mg/dl  COMPARISON:  Multiple exams, including CT 01/14/2018  FINDINGS: Mediastinal blood pool activity: SUV max 2.7  NECK: No significant abnormal hypermetabolic activity in this region.  Incidental CT findings: Chronic bilateral maxillary sinusitis.  CHEST: No significant abnormal hypermetabolic activity in this region.  Incidental CT findings: Centrilobular emphysema. Mild scarring anteriorly in the right upper lobe. Mild atelectasis in the left lower lobe along the diaphragm. Coronary, aortic arch, and branch vessel atherosclerotic vascular disease. Mild cardiomegaly.  ABDOMEN/PELVIS: Subtle accentuated activity at the gastroesophageal junction/proximal stomach, maximum SUV 5.2. Further distally in the stomach body a representative maximum SUV is 3.4. No appreciable hypermetabolic activity in the liver. No hypermetabolic adenopathy in the abdomen/pelvis. Photopenic cyst of the left kidney upper pole.  Accentuated activity in the sigmoid colon is likely physiologic.  Focally accentuated activity along the right posterior prostate gland in the vicinity of  the apex is not in the expected location of the prostatic urethra, and has a maximum SUV of 6.8. Prostate cancer is a distinct possibility.  Incidental CT findings: Small hypodense lesions in the left hepatic lobe are stable from 2016 and highly likely to be benign.  Nonobstructive right nephrolithiasis. Photopenic cyst of the left kidney lower pole and of the left kidney upper pole.  Aortoiliac atherosclerotic vascular disease.  SKELETON: No significant abnormal hypermetabolic activity in this region.  Incidental CT findings: Notable right subcoracoid bursitis and severe  arthropathy of the right glenohumeral joint. Degenerative bilateral hip arthropathy.  IMPRESSION: 1. Low-grade accentuated activity in the proximal stomach adjacent to the gastroesophageal junction, maximum SUV 5.2. No findings of metastatic disease to the liver or regional lymph nodes. 2. There is a small focus of hypermetabolic activity in the right posterior apical peripheral zone of the prostate gland, maximum SUV 6.8-prostate cancer versus active inflammatory lesion. No local adenopathy observed. 3. Other imaging findings of potential clinical significance: Aortic Atherosclerosis (ICD10-I70.0) and Emphysema (ICD10-J43.9). Coronary atherosclerosis. Chronic bilateral maxillary sinusitis. Nonobstructive right nephrolithiasis. Notable right subcoracoid bursitis and prominent arthropathy of the right glenohumeral joint. Degenerative bilateral hip arthropathy.   Electronically Signed   By: Van Clines M.D.   On: 01/27/2018 14:50     Ct Chest W Contrast  Result Date: 01/15/2018 CLINICAL DATA:  New diagnosis of nonobstructing gastroesophageal junction adenocarcinoma on 01/07/2018 upper endoscopy. EXAM: CT CHEST, ABDOMEN, AND PELVIS WITH CONTRAST TECHNIQUE: Multidetector CT imaging of the chest, abdomen and pelvis was performed following the standard protocol during bolus administration of intravenous contrast. CONTRAST:  138mL OMNIPAQUE IOHEXOL 300 MG/ML  SOLN COMPARISON:  12/08/2017 chest radiograph. 07/11/2015 CT abdomen/pelvis. FINDINGS: CT CHEST FINDINGS Cardiovascular: Normal heart size. No significant pericardial effusion/thickening. Left main and 3 vessel coronary atherosclerosis. Atherosclerotic nonaneurysmal thoracic aorta. Normal caliber pulmonary arteries. No central pulmonary emboli. Mediastinum/Nodes: No discrete thyroid nodules. There is mild eccentric wall thickening posteriorly at the esophagogastric junction (series 2/image 52), without a discrete measurable mass by  CT. No pathologically enlarged axillary, mediastinal or hilar lymph nodes. Lungs/Pleura: No pneumothorax. No pleural effusion. Moderate centrilobular emphysema with diffuse bronchial wall thickening. Sub solid right upper lobe 5 mm pulmonary nodule (series 4/image 36). No acute consolidative airspace disease, lung masses or additional significant pulmonary nodules. A few scattered small parenchymal bands at the lung bases, compatible with mild postinfectious/postinflammatory scarring. Musculoskeletal: No aggressive appearing focal osseous lesions. Right shoulder joint effusion/bursal collection anterior to the right scapula (series 2/image 6). Asymmetric advanced right shoulder osteoarthritis. Marked thoracic spondylosis. CT ABDOMEN PELVIS FINDINGS Hepatobiliary: Normal liver size. Subcentimeter hypodense left liver lobe lesions are too small to characterize and are stable since 07/11/2015 CT, considered benign. No new liver lesions. Normal gallbladder with no radiopaque cholelithiasis. No biliary ductal dilatation. Pancreas: Normal, with no mass or duct dilation. Spleen: Normal size. No mass. Adrenals/Urinary Tract: Normal adrenals. No hydronephrosis. Nonobstructing 5 mm lower right renal stone. Small simple renal cysts in both kidneys, largest 2.9 cm in the lateral upper left kidney. Additional subcentimeter hypodense renal cortical lesions in both kidneys are too small to characterize and require no follow-up. Normal bladder. Stomach/Bowel: Mild eccentric wall thickening posteriorly at the esophagogastric junction (series 2/image 52), without a discrete measurable mass by CT. Otherwise normal stomach. Normal caliber small bowel with no small bowel wall thickening. Normal appendix. Normal large bowel with no diverticulosis, large bowel wall thickening or pericolonic fat stranding. Vascular/Lymphatic: Atherosclerotic nonaneurysmal abdominal aorta. Patent portal, splenic,  hepatic and renal veins. No pathologically  enlarged lymph nodes in the abdomen or pelvis. Reproductive: Mildly enlarged prostate with nonspecific internal prostatic calcifications. Other: No pneumoperitoneum, ascites or focal fluid collection. Small fat containing left inguinal hernia, stable. Musculoskeletal: No aggressive appearing focal osseous lesions. Marked lumbar spondylosis. Postsurgical changes from bilateral posterior spinal fusion at L5-S1. IMPRESSION: 1. Mild eccentric wall thickening posteriorly at the esophagogastric junction, without a discrete measurable mass by CT, compatible with the provided history of esophagogastric junction malignancy. 2. No locoregional adenopathy. No definite distant metastatic disease. 3. Solitary subsolid 5 mm right upper lobe pulmonary nodule. Recommend attention on follow-up chest CT in 3-6 months. 4. Chronic findings include: Aortic Atherosclerosis (ICD10-I70.0) and Emphysema (ICD10-J43.9). Left main and 3 vessel coronary atherosclerosis. Nonobstructing right nephrolithiasis. Mildly enlarged prostate. Stable small fat containing left inguinal hernia. Electronically Signed   By: Ilona Sorrel M.D.   On: 01/15/2018 16:34     I have independently reviewed the above radiology studies  and reviewed the findings with the patient.   Recent Lab Findings: Lab Results  Component Value Date   WBC 4.9 03/25/2018   HGB 13.1 03/25/2018   HCT 39.2 03/25/2018   PLT 140 03/25/2018   GLUCOSE 107 (H) 03/03/2018   CHOL 177 12/11/2017   TRIG 112 12/11/2017   HDL 57 12/11/2017   LDLDIRECT 136.6 08/24/2013   LDLCALC 98 12/11/2017   ALT 15 03/03/2018   AST 17 03/03/2018   NA 143 03/03/2018   K 3.7 03/03/2018   CL 108 03/03/2018   CREATININE 0.78 03/03/2018   BUN 12 03/03/2018   CO2 26 03/03/2018   TSH 0.47 11/06/2016   INR 2.7 09/06/2018   HGBA1C 6.0 (H) 12/11/2017   ENDO:/ no EUS done A medium-sized, ulcerating mass was found at the gastroesophageal junction. The mass was nonobstructing and not  circumferential. Biopsies were taken with a cold forceps for histology. Findings: Erythematous mucosa was found in the prepyloric region of the stomach. The in the duodenum was normal.   PATH: Diagnosis Esophagogastric junction, biopsy - ADENOCARCINOMA. - SEE COMMENT. Microscopic Comment Dr. Vicente Males has reviewed the case and concurs with this interpretation. Dr. Penelope Coop was paged on 01/08/2018. Additional studies can be performed upon clinician request. (JBK:ah 01/08/18) Enid Cutter MD Pathologist, Electronic Signature (Case signed 01/08/2018)   Pulmonary function studies done in the pulmonary office 2018 FEV1 1.9 to 70% predicted DLCO 22.4 47% predicted No formal reading with the study   Assessment / Plan:   #1 adenocarcinoma of the GE junction, partially obstructing mass noted on endoscopy 01/07/2018, CT scan of the chest and abdomen shows small 5 mm right upper lobe lung nodule and eccentric wall thickening of the GE junction #2 patient presented with GI bleed secondary to #1 with blood loss anemia #3 history of atrial fibrillation had been on Coumadin prior to admission for GI bleed-he is waiting to hear from cardiology concerning resuming his Coumadin therapy #4 known coronary artery disease #5 probable COPD-moderate   The patient has tolerated his current therapy reasonably well, however he continues to be weak.  We again discussed surgical resection.  He is satisfied to continue without surgical resection.  We will plan to see him back as needed   Grace Isaac MD      [address] [place] [address] Office [phone]   Beeper [phone]  09/16/2018 2:48 PM

## 2018-09-21 ENCOUNTER — Ambulatory Visit: Payer: Medicare Other | Admitting: Emergency Medicine

## 2018-09-29 ENCOUNTER — Other Ambulatory Visit: Payer: Self-pay | Admitting: Interventional Cardiology

## 2018-09-30 ENCOUNTER — Ambulatory Visit (INDEPENDENT_AMBULATORY_CARE_PROVIDER_SITE_OTHER): Payer: Medicare Other | Admitting: Emergency Medicine

## 2018-09-30 ENCOUNTER — Encounter: Payer: Self-pay | Admitting: Emergency Medicine

## 2018-09-30 DIAGNOSIS — J301 Allergic rhinitis due to pollen: Secondary | ICD-10-CM | POA: Diagnosis not present

## 2018-09-30 DIAGNOSIS — J449 Chronic obstructive pulmonary disease, unspecified: Secondary | ICD-10-CM

## 2018-09-30 DIAGNOSIS — R911 Solitary pulmonary nodule: Secondary | ICD-10-CM | POA: Insufficient documentation

## 2018-09-30 NOTE — Progress Notes (Signed)
Subjective:    Patient ID: Larry Bautista, male    DOB: 09/30/1940, 78 y.o.   MRN: [id number]  HPI 78 year old former smoker (70 pack years), with a history of coronary disease and PTCI, hypertension, atrial fibrillation on anticoagulation, distal esophageal adenocarcinoma diagnosed 01/2018.  He has received chemoradiation completed September, has considered surgical resection but this is been deferred. He is not having melena or hematemesis.  He has been followed by Dr. Lenna Gilford in our office for chronic rhinitis as well as mixed obstruction and restriction on PFT with a positive bronchodilator response 11/06/2016.  He has a 5 mm solid right upper lobe pulmonary nodule noted on CT 01/14/2018 (reviewed).  Currently managed on flonase, zyrtec, has albuterol to use prn. He used to hear some wheeze, have exertional SOB but these things aren't bothering him right now. Hasn't needed albuterol for over a year.   Has a strong family hx lung CA.    Review of Systems  Past Medical History:  Diagnosis Date  . Allergy   . CHF (congestive heart failure) ([place])   . Chronic knee pain   . Coronary artery disease    with LAD DE stent 2004  . Heel spur, left   . Hyperlipidemia   . Hypertension   . Kidney stones   . Lower back pain    status post surgery for spondylolisthesis  . Migraine   . Persistent atrial fibrillation    longstanding persistent (since 05/2008)  . Plantar fasciitis      Family History  Problem Relation Age of Onset  . Heart attack Father   . Colon cancer Father   . Congestive Heart Failure Father   . Atrial fibrillation Brother   . Stroke Brother   . Bladder Cancer Brother   . Lung cancer Mother   . Atrial fibrillation Brother   . COPD Brother   . Congestive Heart Failure Brother   . Lung cancer Brother   . Migraines Daughter      Social History   Socioeconomic History  . Marital status: Married    Spouse name: Eudell Mcphee  . Number of children: 1  . Years of  education: Not on file  . Highest education level: Bachelor's degree (e.g., BA, AB, BS)  Occupational History  . Occupation: retired  Scientific laboratory technician  . Financial resource strain: Not hard at all  . Food insecurity:    Worry: Never true    Inability: Never true  . Transportation needs:    Medical: No    Non-medical: No  Tobacco Use  . Smoking status: Former Smoker    Packs/day: 2.00    Years: 35.00    Pack years: 70.00    Types: Cigarettes    Start date: 09/08/1954    Last attempt to quit: 11/06/1989    Years since quitting: 28.9  . Smokeless tobacco: Never Used  . Tobacco comment: Smoked from [phone] (35 years)  Substance and Sexual Activity  . Alcohol use: Not Currently    Alcohol/week: 2.0 standard drinks    Types: 2 Standard drinks or equivalent per week    Comment: 1961-2009 (Last drink 2009)   . Drug use: No  . Sexual activity: Not on file  Lifestyle  . Physical activity:    Days per week: Not on file    Minutes per session: Not on file  . Stress: Not at all  Relationships  . Social connections:    Talks on phone: Not on file  Gets together: Not on file    Attends religious service: Not on file    Active member of club or organization: Not on file    Attends meetings of clubs or organizations: Not on file    Relationship status: Not on file  . Intimate partner violence:    Fear of current or ex partner: Not on file    Emotionally abused: Not on file    Physically abused: Not on file    Forced sexual activity: Not on file  Other Topics Concern  . Not on file  Social History Narrative   Retired Insurance underwriter. Married in 2015. Has one daughter, and 4 grandchildren. He lives in [place].   Was in the [employer], Secretary/administrator, Film/video editor, Print production planner.     Allergies  Allergen Reactions  . Celecoxib Rash  . Lisinopril Cough  . Simvastatin Other (See Comments)    Leg pain     Outpatient Medications Prior to Visit  Medication Sig Dispense  Refill  . albuterol (PROAIR HFA) 108 (90 Base) MCG/ACT inhaler Inhale 1-2 puffs into the lungs every 6 (six) hours as needed for wheezing or shortness of breath. 1 Inhaler prn  . cetirizine (ZYRTEC) 10 MG tablet Take 10 mg by mouth daily.    . diphenhydramine-acetaminophen (TYLENOL PM) 25-500 MG TABS Take 1 tablet by mouth at bedtime as needed (for sleep).     [name] eplerenone (INSPRA) 25 MG tablet TAKE ONE (1) TABLET EACH DAY 90 tablet 3  . fluticasone (FLONASE) 50 MCG/ACT nasal spray USE 2 SPRAYS IN EACH NOSTRIL DAILY 48 g 3  . gabapentin (NEURONTIN) 300 MG capsule Take 300 mg by mouth every other day.     . metoprolol succinate (TOPROL-XL) 25 MG 24 hr tablet TAKE ONE (1) TABLET EACH DAY 90 tablet 3  . nitroGLYCERIN (NITROSTAT) 0.4 MG SL tablet Place 0.4 mg under the tongue every 5 (five) minutes as needed.    . pantoprazole (PROTONIX) 40 MG tablet TAKE ONE (1) TABLET EACH DAY 90 tablet 0  . psyllium (METAMUCIL) 58.6 % powder Take 1 packet by mouth daily.     . tamsulosin (FLOMAX) 0.4 MG CAPS capsule Take 0.4 mg by mouth daily after supper.    . vitamin C (ASCORBIC ACID) 500 MG tablet Take 500 mg by mouth daily.    [name] warfarin (COUMADIN) 2.5 MG tablet Take 2.5 mg by mouth daily.    [name] warfarin (COUMADIN) 5 MG tablet TAKE ONE TABLET BY MOUTH DAILY AS NEEDED 80 tablet 1   No facility-administered medications prior to visit.         Objective:   Physical Exam  Vitals:   09/30/18 1132  BP: 128/82  Pulse: (!) 56  SpO2: 100%  Weight: 183 lb 3.2 oz (83.1 kg)  Height: 5\' 7"  (1.702 m)   Gen: Pleasant, well-nourished, in no distress,  normal affect  ENT: No lesions,  mouth clear,  oropharynx clear, no postnasal drip  Neck: No JVD, no stridor  Lungs: No use of accessory muscles, no crackles or wheezing on normal respiration, no wheeze on forced expiration  Cardiovascular: RRR, heart sounds normal, no murmur or gallops, no peripheral edema  Musculoskeletal: No deformities, no cyanosis or  clubbing  Neuro: alert, awake, non focal  Skin: Warm, no lesions or rash     Assessment & Plan:  Allergic rhinitis Fairly good control on his Zyrtec and Flonase.  He takes reliably.  His dyspnea has improved since his allergies  have been better controlled  COPD with chronic bronchitis ([place]) He denies any dyspnea.  His bronchitic symptoms have improved significantly since his allergies have been better controlled.  He has a cough or have significant sputum.  He has not needed his albuterol for over a year.  I do not think he needs to be on a schedule bronchodilator at this time although we could consider going forward if his symptoms escalate.  Pulmonary nodule We talked today about the pros and cons of following his pulmonary nodule especially in light of his esophageal cancer.  The nodule is 5 mm and asymptomatic.  I think that he is much more likely to evolve recurrent esophageal symptoms then he is to be bothered by the pulmonary nodule.  At this time we both agreed to defer any follow-up imaging, at least any imaging specifically targeted to follow the nodule itself.  If symptoms evolve we can reconsider  Baltazar Apo, MD, PhD 09/30/2018, 12:10 PM [facility] [phone] or if no answer [phone]

## 2018-09-30 NOTE — Assessment & Plan Note (Signed)
We talked today about the pros and cons of following his pulmonary nodule especially in light of his esophageal cancer.  The nodule is 5 mm and asymptomatic.  I think that he is much more likely to evolve recurrent esophageal symptoms then he is to be bothered by the pulmonary nodule.  At this time we both agreed to defer any follow-up imaging, at least any imaging specifically targeted to follow the nodule itself.  If symptoms evolve we can reconsider

## 2018-09-30 NOTE — Patient Instructions (Addendum)
Please continue your Zyrtec and fluticasone nasal spray as you have been using them. Keep your albuterol available so that you can use 2 puffs if you needed for shortness of breath, chest tightness, wheezing. We will not start an every day inhaler at this time. We talked today about your pulmonary nodule seen on your 01/2018.  At this time we will defer any follow-up scans unless you develop new symptoms such as increased cough, mucus, blood in your mucus. Follow with Dr Lamonte Sakai in 6 months or sooner if you have any problems

## 2018-09-30 NOTE — Assessment & Plan Note (Signed)
Fairly good control on his Zyrtec and Flonase.  He takes reliably.  His dyspnea has improved since his allergies have been better controlled

## 2018-09-30 NOTE — Assessment & Plan Note (Signed)
He denies any dyspnea.  His bronchitic symptoms have improved significantly since his allergies have been better controlled.  He has a cough or have significant sputum.  He has not needed his albuterol for over a year.  I do not think he needs to be on a schedule bronchodilator at this time although we could consider going forward if his symptoms escalate.

## 2018-10-04 ENCOUNTER — Ambulatory Visit (INDEPENDENT_AMBULATORY_CARE_PROVIDER_SITE_OTHER): Payer: Medicare Other

## 2018-10-04 DIAGNOSIS — Z5181 Encounter for therapeutic drug level monitoring: Secondary | ICD-10-CM

## 2018-10-04 DIAGNOSIS — I4891 Unspecified atrial fibrillation: Secondary | ICD-10-CM | POA: Diagnosis not present

## 2018-10-04 DIAGNOSIS — I480 Paroxysmal atrial fibrillation: Secondary | ICD-10-CM | POA: Diagnosis not present

## 2018-10-04 LAB — POCT INR: INR: 2.9 (ref 2.0–3.0)

## 2018-10-04 NOTE — Patient Instructions (Signed)
Description   Continue same dosage of coumadin 1/2 tablet daily. Recheck INR in 5 weeks. Coumadin clinic # [phone]. Main # [phone].

## 2018-10-05 DIAGNOSIS — M722 Plantar fascial fibromatosis: Secondary | ICD-10-CM | POA: Diagnosis not present

## 2018-10-05 DIAGNOSIS — M79672 Pain in left foot: Secondary | ICD-10-CM | POA: Diagnosis not present

## 2018-10-11 DIAGNOSIS — M19011 Primary osteoarthritis, right shoulder: Secondary | ICD-10-CM | POA: Diagnosis not present

## 2018-10-11 DIAGNOSIS — M25511 Pain in right shoulder: Secondary | ICD-10-CM | POA: Diagnosis not present

## 2018-10-15 ENCOUNTER — Encounter: Payer: Self-pay | Admitting: Family Medicine

## 2018-10-15 DIAGNOSIS — M19011 Primary osteoarthritis, right shoulder: Secondary | ICD-10-CM | POA: Insufficient documentation

## 2018-10-18 ENCOUNTER — Telehealth: Payer: Self-pay | Admitting: Oncology

## 2018-10-18 ENCOUNTER — Inpatient Hospital Stay: Payer: Medicare Other | Attending: Oncology | Admitting: Oncology

## 2018-10-18 VITALS — BP 130/94 | HR 69 | Temp 97.5°F | Resp 19 | Ht 67.0 in | Wt 181.1 lb

## 2018-10-18 DIAGNOSIS — C160 Malignant neoplasm of cardia: Secondary | ICD-10-CM

## 2018-10-18 DIAGNOSIS — Z923 Personal history of irradiation: Secondary | ICD-10-CM | POA: Insufficient documentation

## 2018-10-18 DIAGNOSIS — Z9221 Personal history of antineoplastic chemotherapy: Secondary | ICD-10-CM | POA: Insufficient documentation

## 2018-10-18 DIAGNOSIS — Z7901 Long term (current) use of anticoagulants: Secondary | ICD-10-CM | POA: Diagnosis not present

## 2018-10-18 DIAGNOSIS — Z85028 Personal history of other malignant neoplasm of stomach: Secondary | ICD-10-CM

## 2018-10-18 DIAGNOSIS — J449 Chronic obstructive pulmonary disease, unspecified: Secondary | ICD-10-CM | POA: Diagnosis not present

## 2018-10-18 DIAGNOSIS — D6959 Other secondary thrombocytopenia: Secondary | ICD-10-CM | POA: Diagnosis not present

## 2018-10-18 DIAGNOSIS — Z87891 Personal history of nicotine dependence: Secondary | ICD-10-CM | POA: Diagnosis not present

## 2018-10-18 NOTE — Telephone Encounter (Signed)
Per 02/10 los.  Printed calendar and avs.

## 2018-10-18 NOTE — Progress Notes (Signed)
  [facility] OFFICE PROGRESS NOTE   Diagnosis: Gastroesophageal cancer  INTERVAL HISTORY:   Larry Bautista returns as scheduled.  He feels well.  No dysphasia.  He has a cold sensation in the throat after drinking a cold liquid.  He is losing the great toenail of the right foot.  Objective:  Vital signs in last 24 hours:  Blood pressure (!) 130/94, pulse 69, temperature (!) 97.5 F (36.4 C), temperature source Oral, resp. rate 19, height 5\' 7"  (1.702 m), weight 181 lb 1.6 oz (82.1 kg), SpO2 100 %.    HEENT: Prominence of the left compared to the right submandibular gland.  Oropharynx without visible mass. Lymphatics: No cervical, supraclavicular, axillary, or inguinal nodes Resp: Lungs clear bilaterally Cardio: Irregular GI: No hepatosplenomegaly, nontender, no mass Vascular: No leg edema Skin: He appears to be growing new nails at the first toe bilaterally     Medications: I have reviewed the patient's current medications.   Assessment/Plan: 1. Adenocarcinoma of the GE junction ? Partially obstructing mass noted at the GE junction on endoscopy 01/07/2018, nonobstructing ? CTs 01/14/2018, sub-solid 5 mm right upper lobe nodule, mild eccentric wall thickening at the GE junction ? PET scan 01/27/2018-hypermetabolism at the GE junction, no evidence of metastatic disease ? Initiation of radiation and weekly Taxol/carboplatin 02/03/2018 ? Week 2 Taxol/carboplatin 02/10/2018 ? Week 3 Taxol/carboplatin 02/17/2018 ? Week 4 Taxol/carboplatin 02/24/2018 ? Week 5 Taxol/carboplatin 03/03/2018 ? Radiation completed 03/17/2018 ? CTs 05/06/2018- unchanged right upper lobe nodule, esophagus appears normal 2. History of GI bleeding secondary to #1 3. Atrial fibrillation-maintained on Coumadin 4. Coronary artery disease 5. Chronic exertional dyspnea-likely COPD 6. History of tobacco use 7. History of kidney stones 8. Focus of hypermetabolism at the right prostate on the PET scan  01/27/2018, PSA normal on 02/10/2018 9. History of thrombocytopenia secondary to chemotherapy 10. Odynophagia secondary to chemotherapy/radiation-resolved 11. Rash-most likely secondary to radiation, though the rash was in an atypical distribution;  resolved     Disposition: Larry Bautista is in clinical remission from gastroesophageal cancer.  The plan is to follow him with observation.  He will return for an office visit in 4 months.  15 minutes were spent with the patient today.  The majority of the time was used for counseling and coordination of care.  Betsy Coder, MD  10/18/2018  12:41 PM

## 2018-11-08 ENCOUNTER — Ambulatory Visit (INDEPENDENT_AMBULATORY_CARE_PROVIDER_SITE_OTHER): Payer: Medicare Other | Admitting: Pharmacist

## 2018-11-08 DIAGNOSIS — Z5181 Encounter for therapeutic drug level monitoring: Secondary | ICD-10-CM | POA: Diagnosis not present

## 2018-11-08 DIAGNOSIS — I4891 Unspecified atrial fibrillation: Secondary | ICD-10-CM | POA: Diagnosis not present

## 2018-11-08 LAB — POCT INR: INR: 3.4 — AB (ref 2.0–3.0)

## 2018-11-08 NOTE — Patient Instructions (Signed)
Description   Hold warfarin dose today, then continue same dosage of coumadin 1/2 tablet daily. Recheck INR in 3-4 weeks. Coumadin clinic # [phone]. Main # [phone].

## 2018-11-18 ENCOUNTER — Ambulatory Visit: Payer: Medicare Other

## 2018-11-18 ENCOUNTER — Encounter: Payer: Medicare Other | Admitting: Family Medicine

## 2018-11-19 ENCOUNTER — Ambulatory Visit: Payer: Medicare Other

## 2018-11-19 ENCOUNTER — Ambulatory Visit: Payer: Self-pay | Admitting: Family Medicine

## 2018-11-22 ENCOUNTER — Ambulatory Visit: Payer: Medicare Other | Admitting: Family Medicine

## 2018-11-23 ENCOUNTER — Telehealth: Payer: Self-pay

## 2018-11-23 NOTE — Telephone Encounter (Signed)
Called pt to schedule future AWV and pt declined and stated he wants to wait until the coronavirus has died down. Pt states he will CB to schedule this apt in the future.  -MM

## 2018-12-13 ENCOUNTER — Other Ambulatory Visit: Payer: Self-pay | Admitting: Oncology

## 2018-12-28 ENCOUNTER — Other Ambulatory Visit: Payer: Self-pay | Admitting: Family Medicine

## 2019-01-05 ENCOUNTER — Telehealth: Payer: Self-pay | Admitting: Pharmacist

## 2019-01-05 NOTE — Telephone Encounter (Signed)

## 2019-01-06 ENCOUNTER — Ambulatory Visit (INDEPENDENT_AMBULATORY_CARE_PROVIDER_SITE_OTHER): Payer: Medicare Other | Admitting: Pharmacist

## 2019-01-06 ENCOUNTER — Other Ambulatory Visit: Payer: Self-pay

## 2019-01-06 DIAGNOSIS — Z5181 Encounter for therapeutic drug level monitoring: Secondary | ICD-10-CM

## 2019-01-06 DIAGNOSIS — I4891 Unspecified atrial fibrillation: Secondary | ICD-10-CM

## 2019-01-06 DIAGNOSIS — I480 Paroxysmal atrial fibrillation: Secondary | ICD-10-CM

## 2019-01-06 LAB — POCT INR: INR: 3.3 — AB (ref 2.0–3.0)

## 2019-01-26 ENCOUNTER — Telehealth: Payer: Self-pay

## 2019-01-26 NOTE — Telephone Encounter (Signed)
lmom for prescreen  

## 2019-01-27 ENCOUNTER — Other Ambulatory Visit: Payer: Self-pay

## 2019-01-27 ENCOUNTER — Telehealth: Payer: Self-pay | Admitting: *Deleted

## 2019-01-27 ENCOUNTER — Ambulatory Visit (INDEPENDENT_AMBULATORY_CARE_PROVIDER_SITE_OTHER): Payer: Medicare Other | Admitting: Pharmacist

## 2019-01-27 DIAGNOSIS — I4891 Unspecified atrial fibrillation: Secondary | ICD-10-CM

## 2019-01-27 DIAGNOSIS — I480 Paroxysmal atrial fibrillation: Secondary | ICD-10-CM

## 2019-01-27 DIAGNOSIS — Z5181 Encounter for therapeutic drug level monitoring: Secondary | ICD-10-CM

## 2019-01-27 LAB — POCT INR: INR: 3.3 — AB (ref 2.0–3.0)

## 2019-01-27 MED ORDER — WARFARIN SODIUM 2.5 MG PO TABS: ORAL_TABLET | ORAL | 1 refills | Status: DC

## 2019-01-27 NOTE — Telephone Encounter (Signed)

## 2019-02-10 ENCOUNTER — Telehealth: Payer: Self-pay

## 2019-02-10 NOTE — Telephone Encounter (Signed)

## 2019-02-11 ENCOUNTER — Telehealth: Payer: Self-pay | Admitting: Oncology

## 2019-02-11 NOTE — Telephone Encounter (Signed)
Returned call re rescheduling appointment. Per patient he wishes to just cancel for now.  Per patient in light of everything going on he will not be coming out before August and will call back to reschedule. Message routed to provider.

## 2019-02-15 ENCOUNTER — Ambulatory Visit: Payer: Medicare Other | Admitting: Oncology

## 2019-02-17 ENCOUNTER — Ambulatory Visit (INDEPENDENT_AMBULATORY_CARE_PROVIDER_SITE_OTHER): Payer: Medicare Other

## 2019-02-17 ENCOUNTER — Other Ambulatory Visit: Payer: Self-pay

## 2019-02-17 DIAGNOSIS — I4891 Unspecified atrial fibrillation: Secondary | ICD-10-CM | POA: Diagnosis not present

## 2019-02-17 DIAGNOSIS — Z5181 Encounter for therapeutic drug level monitoring: Secondary | ICD-10-CM | POA: Diagnosis not present

## 2019-02-17 DIAGNOSIS — I480 Paroxysmal atrial fibrillation: Secondary | ICD-10-CM | POA: Diagnosis not present

## 2019-02-17 LAB — POCT INR: INR: 2.9 (ref 2.0–3.0)

## 2019-02-17 NOTE — Patient Instructions (Signed)
Description   Continue on same dosage 2.5mg  (1 tablet) daily except 1/2 tablet (1.25mg  on Thursdays) Recheck INR in 4 weeks. Coumadin clinic # [phone]. Main # [phone].

## 2019-03-10 ENCOUNTER — Telehealth: Payer: Self-pay

## 2019-03-10 NOTE — Telephone Encounter (Signed)

## 2019-03-17 ENCOUNTER — Other Ambulatory Visit: Payer: Self-pay

## 2019-03-17 ENCOUNTER — Ambulatory Visit (INDEPENDENT_AMBULATORY_CARE_PROVIDER_SITE_OTHER): Payer: Medicare Other | Admitting: *Deleted

## 2019-03-17 DIAGNOSIS — I480 Paroxysmal atrial fibrillation: Secondary | ICD-10-CM | POA: Diagnosis not present

## 2019-03-17 DIAGNOSIS — Z5181 Encounter for therapeutic drug level monitoring: Secondary | ICD-10-CM | POA: Diagnosis not present

## 2019-03-17 DIAGNOSIS — I4891 Unspecified atrial fibrillation: Secondary | ICD-10-CM | POA: Diagnosis not present

## 2019-03-17 LAB — POCT INR: INR: 2.7 (ref 2.0–3.0)

## 2019-03-17 NOTE — Patient Instructions (Signed)
Description   Continue on same dosage 2.5mg  (1 tablet) daily except 1/2 tablet (1.25mg ) on Thursdays) Recheck INR in 5 weeks. Coumadin clinic # [phone]. Main # [phone].

## 2019-03-22 ENCOUNTER — Other Ambulatory Visit: Payer: Self-pay | Admitting: Oncology

## 2019-03-29 ENCOUNTER — Other Ambulatory Visit: Payer: Self-pay | Admitting: Interventional Cardiology

## 2019-04-11 DIAGNOSIS — M19011 Primary osteoarthritis, right shoulder: Secondary | ICD-10-CM | POA: Diagnosis not present

## 2019-04-11 DIAGNOSIS — M25511 Pain in right shoulder: Secondary | ICD-10-CM | POA: Diagnosis not present

## 2019-04-15 DIAGNOSIS — R3911 Hesitancy of micturition: Secondary | ICD-10-CM | POA: Diagnosis not present

## 2019-04-15 DIAGNOSIS — N5201 Erectile dysfunction due to arterial insufficiency: Secondary | ICD-10-CM | POA: Diagnosis not present

## 2019-04-15 DIAGNOSIS — N4289 Other specified disorders of prostate: Secondary | ICD-10-CM | POA: Diagnosis not present

## 2019-04-15 DIAGNOSIS — N401 Enlarged prostate with lower urinary tract symptoms: Secondary | ICD-10-CM | POA: Diagnosis not present

## 2019-04-22 ENCOUNTER — Ambulatory Visit (INDEPENDENT_AMBULATORY_CARE_PROVIDER_SITE_OTHER): Payer: Medicare Other | Admitting: *Deleted

## 2019-04-22 ENCOUNTER — Other Ambulatory Visit: Payer: Self-pay

## 2019-04-22 DIAGNOSIS — Z5181 Encounter for therapeutic drug level monitoring: Secondary | ICD-10-CM | POA: Diagnosis not present

## 2019-04-22 DIAGNOSIS — I480 Paroxysmal atrial fibrillation: Secondary | ICD-10-CM

## 2019-04-22 DIAGNOSIS — I4891 Unspecified atrial fibrillation: Secondary | ICD-10-CM

## 2019-04-22 LAB — POCT INR: INR: 4 — AB (ref 2.0–3.0)

## 2019-04-22 NOTE — Patient Instructions (Signed)
Description    No coumadin today, tomorrow (8/15) take 1.25 mg, then continue on same dosage 2.5mg   daily except 1.25mg  on Thursdays. Recheck INR in 2 weeks. Coumadin clinic # [phone]. Main # [phone].

## 2019-05-06 ENCOUNTER — Ambulatory Visit (INDEPENDENT_AMBULATORY_CARE_PROVIDER_SITE_OTHER): Payer: Medicare Other | Admitting: *Deleted

## 2019-05-06 ENCOUNTER — Other Ambulatory Visit: Payer: Self-pay

## 2019-05-06 DIAGNOSIS — I4891 Unspecified atrial fibrillation: Secondary | ICD-10-CM | POA: Diagnosis not present

## 2019-05-06 DIAGNOSIS — I480 Paroxysmal atrial fibrillation: Secondary | ICD-10-CM | POA: Diagnosis not present

## 2019-05-06 DIAGNOSIS — Z5181 Encounter for therapeutic drug level monitoring: Secondary | ICD-10-CM | POA: Diagnosis not present

## 2019-05-06 LAB — POCT INR: INR: 3.8 — AB (ref 2.0–3.0)

## 2019-05-06 NOTE — Patient Instructions (Signed)
Description   No coumadin today then start taking 2.5mg  daily except 1.25mg  on Sundays and Thursdays. Recheck INR in 2 weeks. Coumadin clinic # [phone]. Main # [phone].

## 2019-05-12 DIAGNOSIS — Z23 Encounter for immunization: Secondary | ICD-10-CM | POA: Diagnosis not present

## 2019-05-20 ENCOUNTER — Other Ambulatory Visit: Payer: Self-pay

## 2019-05-20 ENCOUNTER — Ambulatory Visit (INDEPENDENT_AMBULATORY_CARE_PROVIDER_SITE_OTHER): Payer: Medicare Other

## 2019-05-20 DIAGNOSIS — I480 Paroxysmal atrial fibrillation: Secondary | ICD-10-CM | POA: Diagnosis not present

## 2019-05-20 DIAGNOSIS — I4891 Unspecified atrial fibrillation: Secondary | ICD-10-CM

## 2019-05-20 DIAGNOSIS — Z5181 Encounter for therapeutic drug level monitoring: Secondary | ICD-10-CM

## 2019-05-20 LAB — POCT INR: INR: 2.5 (ref 2.0–3.0)

## 2019-05-20 NOTE — Patient Instructions (Signed)
Description   Continue on same dosage 2.5mg  daily except 1.25mg  on Sundays and Thursdays. Recheck INR in 4 weeks. Coumadin clinic # [phone]. Main # [phone].

## 2019-05-30 ENCOUNTER — Other Ambulatory Visit: Payer: Self-pay | Admitting: Family Medicine

## 2019-06-05 NOTE — Progress Notes (Signed)
Cardiology Office Note:    Date:  06/06/2019   ID:  ANAKIN VARKEY, DOB [birth year], MRN [id number]  PCP:  [name], MD  Cardiologist:  Sinclair Grooms, MD   Referring MD: [name], MD   Chief Complaint  Patient presents with  . Atrial Fibrillation  . Coronary Artery Disease    History of Present Illness:    Larry Bautista is a 78 y.o. male with a hx of CAD, chronic atrial fibrillation, anticoagulation therapy, hypertension, and chronic diastolic heart failure.   Larry Bautista is doing fairly well.  He has undergone both chemo and radiation therapy for esophageal cancer.  He has been told that he is in remission.  He had no cardiac complaints or symptoms during the timeframe that therapy was administered.  He does have exertional fatigue.  He gets sleepy relatively easily especially first thing in the morning.  He noticed on 1 occasion that his blood pressure was relatively low in correlation with the fatigue and wondered if there was a relationship.  He denies angina, edema, orthopnea, PND, palpitations, and syncope.  Esophageal cancer was diagnosed after he had hematemesis.  Past Medical History:  Diagnosis Date  . Allergy   . CHF (congestive heart failure) ([place])   . Chronic knee pain   . Coronary artery disease    with LAD DE stent 2004  . Heel spur, left   . Hyperlipidemia   . Hypertension   . Kidney stones   . Lower back pain    status post surgery for spondylolisthesis  . Migraine   . Persistent atrial fibrillation    longstanding persistent (since 05/2008)  . Plantar fasciitis     Past Surgical History:  Procedure Laterality Date  . BACK SURGERY     spondylolisthesis  . CARDIAC CATHETERIZATION  03/06/2003  . CORONARY ANGIOPLASTY WITH STENT PLACEMENT  04/2003   CYPHER stent implantation in the LAD  . ESOPHAGOGASTRODUODENOSCOPY (EGD) WITH PROPOFOL N/A 01/07/2018   Procedure: ESOPHAGOGASTRODUODENOSCOPY (EGD) WITH PROPOFOL;  Surgeon: Wonda Horner,  MD;  Location: [facility] END[facility]COPY;  Service: Endoscopy;  Laterality: N/A;  . HAND SURGERY Right 2014   carpal tunnel  . SKIN CANCER EXCISION  1980's   Face  . VASCULAR SURGERY     Vascular Stent    Current Medications: Current Meds  Medication Sig  . albuterol (PROAIR HFA) 108 (90 Base) MCG/ACT inhaler Inhale 1-2 puffs into the lungs every 6 (six) hours as needed for wheezing or shortness of breath.  . cetirizine (ZYRTEC) 10 MG tablet Take 10 mg by mouth daily.  . diphenhydramine-acetaminophen (TYLENOL PM) 25-500 MG TABS Take 1 tablet by mouth at bedtime as needed (for sleep).   [name] eplerenone (INSPRA) 25 MG tablet Take 1 tablet (25 mg total) by mouth daily. Please keep upcoming appt for future refills. Thank you  . fluticasone (FLONASE) 50 MCG/ACT nasal spray USE 2 SPRAYS IN EACH NOSTRIL DAILY  . gabapentin (NEURONTIN) 300 MG capsule Take 300 mg by mouth every other day.   . metoprolol succinate (TOPROL-XL) 25 MG 24 hr tablet Take 1 tablet (25 mg total) by mouth daily. Please keep upcoming appt for future refills. Thank you  . nitroGLYCERIN (NITROSTAT) 0.4 MG SL tablet Place 0.4 mg under the tongue every 5 (five) minutes as needed.  . pantoprazole (PROTONIX) 40 MG tablet TAKE ONE (1) TABLET EACH DAY  . psyllium (METAMUCIL) 58.6 % powder Take 1 packet by mouth daily.   . tamsulosin (  FLOMAX) 0.4 MG CAPS capsule Take 0.4 mg by mouth daily after supper.  . vitamin C (ASCORBIC ACID) 500 MG tablet Take 500 mg by mouth daily.  [name] warfarin (COUMADIN) 2.5 MG tablet Take as directed by coumadin clinic     Allergies:   Celecoxib, Lisinopril, and Simvastatin   Social History   Socioeconomic History  . Marital status: Married    Spouse name: Larry Bautista  . Number of children: 1  . Years of education: Not on file  . Highest education level: Bachelor's degree (e.g., BA, AB, BS)  Occupational History  . Occupation: retired  Scientific laboratory technician  . Financial resource strain: Not hard at all  . Food  insecurity    Worry: Never true    Inability: Never true  . Transportation needs    Medical: No    Non-medical: No  Tobacco Use  . Smoking status: Former Smoker    Packs/day: 2.00    Years: 35.00    Pack years: 70.00    Types: Cigarettes    Start date: 09/08/1954    Quit date: 11/06/1989    Years since quitting: 29.6  . Smokeless tobacco: Never Used  . Tobacco comment: Smoked from [phone] (35 years)  Substance and Sexual Activity  . Alcohol use: Not Currently    Alcohol/week: 2.0 standard drinks    Types: 2 Standard drinks or equivalent per week    Comment: 1961-2009 (Last drink 2009)   . Drug use: No  . Sexual activity: Not on file  Lifestyle  . Physical activity    Days per week: Not on file    Minutes per session: Not on file  . Stress: Not at all  Relationships  . Social Herbalist on phone: Not on file    Gets together: Not on file    Attends religious service: Not on file    Active member of club or organization: Not on file    Attends meetings of clubs or organizations: Not on file    Relationship status: Not on file  Other Topics Concern  . Not on file  Social History Narrative   Retired Insurance underwriter. Married in 2015. Has one daughter, and 4 grandchildren. He lives in [place].     Family History: The patient's family history includes Atrial fibrillation in his brother and brother; Bladder Cancer in his brother; COPD in his brother; Colon cancer in his father; Congestive Heart Failure in his brother and father; Heart attack in his father; Lung cancer in his brother and mother; Migraines in his daughter; Stroke in his brother.  ROS:   Please see the history of present illness.    After chemotherapy he is noticed that his left thigh and calf are numb.  He lost the toenails on his feet likely related to chemo.  He snores at night.  No difficulty swallowing.  All other systems reviewed and are negative.  EKGs/Labs/Other Studies Reviewed:    The following  studies were reviewed today: No cardiac imaging.  EKG:  EKG ATRIAL fibrillation with slow ventricular response.  Low voltage.  Left axis deviation.  Recent Labs: No results found for requested labs within last 8760 hours.  Recent Lipid Panel    Component Value Date/Time   CHOL 177 12/11/2017 1128   TRIG 112 12/11/2017 1128   HDL 57 12/11/2017 1128   CHOLHDL 3.1 12/11/2017 1128   CHOLHDL 3 01/24/2015 0926   VLDL 26.8 01/24/2015 0926   LDLCALC 98  12/11/2017 1128   LDLDIRECT 136.6 08/24/2013 0938    Physical Exam:    VS:  BP (!) 142/82   Pulse 61   Ht 5' 7"  (1.702 m)   Wt 187 lb 6.4 oz (85 kg)   SpO2 98%   BMI 29.35 kg/m     Wt Readings from Last 3 Encounters:  06/06/19 187 lb 6.4 oz (85 kg)  10/18/18 181 lb 1.6 oz (82.1 kg)  09/30/18 183 lb 3.2 oz (83.1 kg)     GEN: Moderate abdominal obesity.. No acute distress HEENT: Normal NECK: No JVD. LYMPHATICS: No lymphadenopathy CARDIAC: Irregularly irregular  RR without murmur, gallop, or edema. VASCULAR:  Normal Pulses. No bruits. RESPIRATORY:  Clear to auscultation without rales, wheezing or rhonchi  ABDOMEN: Soft, non-tender, non-distended, No pulsatile mass, MUSCULOSKELETAL: No deformity  SKIN: Warm and dry NEUROLOGIC:  Alert and oriented x 3 PSYCHIATRIC:  Normal affect   ASSESSMENT:    1. Permanent atrial fibrillation   2. Essential hypertension   3. CAD in native artery   4. Other hyperlipidemia   5. Educated About Covid-19 Virus Infection    PLAN:    In order of problems listed above:  1. He is in permanent atrial fibrillation.  Rate is well controlled.  He is on anticoagulation therapy. 2. Blood pressure is borderline controlled.  He has noted a few low blood pressures.  No adjustment in therapy is made at this time. 3. The LDL cholesterol is higher than we would like.  Labs will be checked today.  No recent laboratory data 4. Labs including an LDL and total cholesterol will be obtained today. 5. Social  distancing, handwashing, and mask wearing is recommended.  Overall education and awareness concerning primary/secondary risk prevention was discussed in detail: LDL less than 70, hemoglobin A1c less than 7, blood pressure target less than 130/80 mmHg, >150 minutes of moderate aerobic activity per week, avoidance of smoking, weight control (via diet and exercise), and continued surveillance/management of/for obstructive sleep apnea.  Lipid panel, C met, and CBC are obtained today.   Medication Adjustments/Labs and Tests Ordered: Current medicines are reviewed at length with the patient today.  Concerns regarding medicines are outlined above.  No orders of the defined types were placed in this encounter.  No orders of the defined types were placed in this encounter.   There are no Patient Instructions on file for this visit.   Signed, Sinclair Grooms, MD  06/06/2019 4:41 PM    [facility]

## 2019-06-06 ENCOUNTER — Ambulatory Visit (INDEPENDENT_AMBULATORY_CARE_PROVIDER_SITE_OTHER): Payer: Medicare Other | Admitting: Interventional Cardiology

## 2019-06-06 ENCOUNTER — Encounter (INDEPENDENT_AMBULATORY_CARE_PROVIDER_SITE_OTHER): Payer: Self-pay

## 2019-06-06 ENCOUNTER — Other Ambulatory Visit: Payer: Self-pay

## 2019-06-06 ENCOUNTER — Encounter: Payer: Self-pay | Admitting: Interventional Cardiology

## 2019-06-06 VITALS — BP 142/82 | HR 61 | Ht 67.0 in | Wt 187.4 lb

## 2019-06-06 DIAGNOSIS — E7849 Other hyperlipidemia: Secondary | ICD-10-CM | POA: Diagnosis not present

## 2019-06-06 DIAGNOSIS — I2510 Atherosclerotic heart disease of native coronary artery without angina pectoris: Secondary | ICD-10-CM | POA: Diagnosis not present

## 2019-06-06 DIAGNOSIS — I4821 Permanent atrial fibrillation: Secondary | ICD-10-CM

## 2019-06-06 DIAGNOSIS — Z7189 Other specified counseling: Secondary | ICD-10-CM

## 2019-06-06 DIAGNOSIS — I10 Essential (primary) hypertension: Secondary | ICD-10-CM | POA: Diagnosis not present

## 2019-06-06 MED ORDER — NITROGLYCERIN 0.4 MG SL SUBL: 0.4000 mg | SUBLINGUAL_TABLET | SUBLINGUAL | 2 refills | Status: DC | PRN

## 2019-06-06 NOTE — Patient Instructions (Signed)
Medication Instructions:  Your physician recommends that you continue on your current medications as directed. Please refer to the Current Medication list given to you today.  If you need a refill on your cardiac medications before your next appointment, please call your pharmacy.   Lab work: BMET, CBC, Liver and Lipid today  If you have labs (blood work) drawn today and your tests are completely normal, you will receive your results only by: [name] MyChart Message (if you have MyChart) OR . A paper copy in the mail If you have any lab test that is abnormal or we need to change your treatment, we will call you to review the results.  Testing/Procedures: None  Follow-Up: At [facility], you and your health needs are our priority.  As part of our continuing mission to provide you with exceptional heart care, we have created designated Provider Care Teams.  These Care Teams include your primary Cardiologist (physician) and Advanced Practice Providers (APPs -  Physician Assistants and Nurse Practitioners) who all work together to provide you with the care you need, when you need it. You will need a follow up appointment in 12 months.  Please call our office 2 months in advance to schedule this appointment.  You may see Sinclair Grooms, MD or one of the following Advanced Practice Providers on your designated Care Team:   Truitt Merle, NP Cecilie Kicks, NP . Kathyrn Drown, NP  Any Other Special Instructions Will Be Listed Below (If Applicable).

## 2019-06-07 LAB — CBC
Hematocrit: 43.5 % (ref 37.5–51.0)
Hemoglobin: 14.8 g/dL (ref 13.0–17.7)
MCH: 31.2 pg (ref 26.6–33.0)
MCHC: 34 g/dL (ref 31.5–35.7)
MCV: 92 fL (ref 79–97)
Platelets: 259 10*3/uL (ref 150–450)
RBC: 4.75 x10E6/uL (ref 4.14–5.80)
RDW: 13.2 % (ref 11.6–15.4)
WBC: 7.5 10*3/uL (ref 3.4–10.8)

## 2019-06-07 LAB — HEPATIC FUNCTION PANEL
ALT: 22 IU/L (ref 0–44)
AST: 26 IU/L (ref 0–40)
Albumin: 4.5 g/dL (ref 3.7–4.7)
Alkaline Phosphatase: 62 IU/L (ref 39–117)
Bilirubin Total: 0.4 mg/dL (ref 0.0–1.2)
Bilirubin, Direct: 0.17 mg/dL (ref 0.00–0.40)
Total Protein: 6.7 g/dL (ref 6.0–8.5)

## 2019-06-07 LAB — LIPID PANEL
Chol/HDL Ratio: 5 ratio (ref 0.0–5.0)
Cholesterol, Total: 248 mg/dL — ABNORMAL HIGH (ref 100–199)
HDL: 50 mg/dL (ref >39)
LDL Chol Calc (NIH): 171 mg/dL — ABNORMAL HIGH (ref 0–99)
Triglycerides: 147 mg/dL (ref 0–149)
VLDL Cholesterol Cal: 27 mg/dL (ref 5–40)

## 2019-06-07 LAB — BASIC METABOLIC PANEL
BUN/Creatinine Ratio: 13 (ref 10–24)
BUN: 11 mg/dL (ref 8–27)
CO2: 23 mmol/L (ref 20–29)
Calcium: 9.4 mg/dL (ref 8.6–10.2)
Chloride: 106 mmol/L (ref 96–106)
Creatinine, Ser: 0.87 mg/dL (ref 0.76–1.27)
GFR calc Af Amer: 95 mL/min/{1.73_m2} (ref >59)
GFR calc non Af Amer: 82 mL/min/{1.73_m2} (ref >59)
Glucose: 87 mg/dL (ref 65–99)
Potassium: 4 mmol/L (ref 3.5–5.2)
Sodium: 143 mmol/L (ref 134–144)

## 2019-06-09 ENCOUNTER — Telehealth: Payer: Self-pay | Admitting: Interventional Cardiology

## 2019-06-09 DIAGNOSIS — E7849 Other hyperlipidemia: Secondary | ICD-10-CM

## 2019-06-09 MED ORDER — ROSUVASTATIN CALCIUM 20 MG PO TABS: 20.0000 mg | ORAL_TABLET | Freq: Every day | ORAL | 3 refills | Status: DC

## 2019-06-09 NOTE — Telephone Encounter (Signed)
New Message:  Patient was returning a call from our office. He believes it may have been Anderson Malta, Dr. Thompson Caul nurse. Please call

## 2019-06-09 NOTE — Telephone Encounter (Signed)
Spoke with pt and went over results and recommendations. Pt agreeable to plan. Advised I will call when it is a little closer to time for labs and plan to schedule them at the same time as one of his Coumadin checks since he lives so far away. Pt appreciative.

## 2019-06-13 ENCOUNTER — Other Ambulatory Visit: Payer: Self-pay | Admitting: Oncology

## 2019-06-17 ENCOUNTER — Other Ambulatory Visit: Payer: Self-pay

## 2019-06-17 ENCOUNTER — Ambulatory Visit (INDEPENDENT_AMBULATORY_CARE_PROVIDER_SITE_OTHER): Payer: Medicare Other | Admitting: *Deleted

## 2019-06-17 DIAGNOSIS — I480 Paroxysmal atrial fibrillation: Secondary | ICD-10-CM

## 2019-06-17 DIAGNOSIS — Z5181 Encounter for therapeutic drug level monitoring: Secondary | ICD-10-CM

## 2019-06-17 DIAGNOSIS — I4891 Unspecified atrial fibrillation: Secondary | ICD-10-CM

## 2019-06-17 LAB — POCT INR: INR: 2.4 (ref 2.0–3.0)

## 2019-06-17 NOTE — Patient Instructions (Signed)
Description   Continue on same dosage 2.5mg  daily except 1.25mg  on Sundays and Thursdays. Recheck INR in 5 weeks. Coumadin clinic # [phone]. Main # [phone].

## 2019-06-21 DIAGNOSIS — G039 Meningitis, unspecified: Secondary | ICD-10-CM | POA: Diagnosis not present

## 2019-06-21 DIAGNOSIS — M4726 Other spondylosis with radiculopathy, lumbar region: Secondary | ICD-10-CM | POA: Diagnosis not present

## 2019-06-21 DIAGNOSIS — Z981 Arthrodesis status: Secondary | ICD-10-CM | POA: Diagnosis not present

## 2019-06-21 DIAGNOSIS — M5442 Lumbago with sciatica, left side: Secondary | ICD-10-CM | POA: Diagnosis not present

## 2019-07-04 ENCOUNTER — Other Ambulatory Visit: Payer: Self-pay | Admitting: Interventional Cardiology

## 2019-07-22 ENCOUNTER — Other Ambulatory Visit: Payer: Medicare Other | Admitting: *Deleted

## 2019-07-22 ENCOUNTER — Ambulatory Visit (INDEPENDENT_AMBULATORY_CARE_PROVIDER_SITE_OTHER): Payer: Medicare Other | Admitting: *Deleted

## 2019-07-22 ENCOUNTER — Other Ambulatory Visit: Payer: Self-pay

## 2019-07-22 DIAGNOSIS — Z5181 Encounter for therapeutic drug level monitoring: Secondary | ICD-10-CM

## 2019-07-22 DIAGNOSIS — I4891 Unspecified atrial fibrillation: Secondary | ICD-10-CM

## 2019-07-22 DIAGNOSIS — I480 Paroxysmal atrial fibrillation: Secondary | ICD-10-CM

## 2019-07-22 DIAGNOSIS — E7849 Other hyperlipidemia: Secondary | ICD-10-CM

## 2019-07-22 LAB — HEPATIC FUNCTION PANEL
ALT: 16 IU/L (ref 0–44)
AST: 24 IU/L (ref 0–40)
Albumin: 4.5 g/dL (ref 3.7–4.7)
Alkaline Phosphatase: 55 IU/L (ref 39–117)
Bilirubin Total: 0.7 mg/dL (ref 0.0–1.2)
Bilirubin, Direct: 0.22 mg/dL (ref 0.00–0.40)
Total Protein: 6.2 g/dL (ref 6.0–8.5)

## 2019-07-22 LAB — LIPID PANEL
Chol/HDL Ratio: 2.8 ratio (ref 0.0–5.0)
Cholesterol, Total: 152 mg/dL (ref 100–199)
HDL: 54 mg/dL (ref >39)
LDL Chol Calc (NIH): 75 mg/dL (ref 0–99)
Triglycerides: 129 mg/dL (ref 0–149)
VLDL Cholesterol Cal: 23 mg/dL (ref 5–40)

## 2019-07-22 LAB — POCT INR: INR: 3.9 — AB (ref 2.0–3.0)

## 2019-07-22 NOTE — Patient Instructions (Signed)
Description   Do not take any Warfarin today then continue on same dosage 2.5mg  daily except 1.25mg  on Sundays and Thursdays. Recheck INR in 3 weeks. Coumadin clinic # [phone]. Main # [phone].

## 2019-08-12 ENCOUNTER — Ambulatory Visit (INDEPENDENT_AMBULATORY_CARE_PROVIDER_SITE_OTHER): Payer: Medicare Other | Admitting: *Deleted

## 2019-08-12 ENCOUNTER — Other Ambulatory Visit: Payer: Self-pay

## 2019-08-12 DIAGNOSIS — I4891 Unspecified atrial fibrillation: Secondary | ICD-10-CM | POA: Diagnosis not present

## 2019-08-12 DIAGNOSIS — Z5181 Encounter for therapeutic drug level monitoring: Secondary | ICD-10-CM | POA: Diagnosis not present

## 2019-08-12 LAB — POCT INR: INR: 3.7 — AB (ref 2.0–3.0)

## 2019-08-12 NOTE — Patient Instructions (Signed)
Description   Do not take any Warfarin today then start taking 2.5mg  daily except 1.25mg  on Sundays, Tuesdays and Thursdays. Recheck INR in 3 weeks. Coumadin clinic # [phone]. Main # [phone].

## 2019-08-21 ENCOUNTER — Inpatient Hospital Stay (HOSPITAL_COMMUNITY)
Admission: EM | Admit: 2019-08-21 | Discharge: 2019-08-25 | DRG: 913 | Disposition: A | Discharge status: Skilled Nursing Facility | Payer: Medicare Other | Attending: Internal Medicine | Admitting: Internal Medicine

## 2019-08-21 ENCOUNTER — Other Ambulatory Visit: Payer: Self-pay

## 2019-08-21 ENCOUNTER — Emergency Department (HOSPITAL_COMMUNITY): Payer: Medicare Other

## 2019-08-21 ENCOUNTER — Encounter (HOSPITAL_COMMUNITY): Payer: Self-pay | Admitting: Emergency Medicine

## 2019-08-21 DIAGNOSIS — T402X5A Adverse effect of other opioids, initial encounter: Secondary | ICD-10-CM | POA: Diagnosis not present

## 2019-08-21 DIAGNOSIS — M25562 Pain in left knee: Secondary | ICD-10-CM

## 2019-08-21 DIAGNOSIS — Z66 Do not resuscitate: Secondary | ICD-10-CM | POA: Diagnosis not present

## 2019-08-21 DIAGNOSIS — G92 Toxic encephalopathy: Secondary | ICD-10-CM | POA: Diagnosis not present

## 2019-08-21 DIAGNOSIS — Z8501 Personal history of malignant neoplasm of esophagus: Secondary | ICD-10-CM

## 2019-08-21 DIAGNOSIS — I2510 Atherosclerotic heart disease of native coronary artery without angina pectoris: Secondary | ICD-10-CM | POA: Diagnosis present

## 2019-08-21 DIAGNOSIS — S8982XA Other specified injuries of left lower leg, initial encounter: Secondary | ICD-10-CM | POA: Diagnosis not present

## 2019-08-21 DIAGNOSIS — I509 Heart failure, unspecified: Secondary | ICD-10-CM | POA: Diagnosis present

## 2019-08-21 DIAGNOSIS — I110 Hypertensive heart disease with heart failure: Secondary | ICD-10-CM | POA: Diagnosis present

## 2019-08-21 DIAGNOSIS — M25569 Pain in unspecified knee: Secondary | ICD-10-CM | POA: Diagnosis present

## 2019-08-21 DIAGNOSIS — Z955 Presence of coronary angioplasty implant and graft: Secondary | ICD-10-CM

## 2019-08-21 DIAGNOSIS — Z825 Family history of asthma and other chronic lower respiratory diseases: Secondary | ICD-10-CM

## 2019-08-21 DIAGNOSIS — Z923 Personal history of irradiation: Secondary | ICD-10-CM

## 2019-08-21 DIAGNOSIS — X501XXA Overexertion from prolonged static or awkward postures, initial encounter: Secondary | ICD-10-CM

## 2019-08-21 DIAGNOSIS — I4821 Permanent atrial fibrillation: Secondary | ICD-10-CM | POA: Diagnosis not present

## 2019-08-21 DIAGNOSIS — Z20828 Contact with and (suspected) exposure to other viral communicable diseases: Secondary | ICD-10-CM | POA: Diagnosis not present

## 2019-08-21 DIAGNOSIS — E785 Hyperlipidemia, unspecified: Secondary | ICD-10-CM | POA: Diagnosis present

## 2019-08-21 DIAGNOSIS — Z800 Family history of malignant neoplasm of digestive organs: Secondary | ICD-10-CM

## 2019-08-21 DIAGNOSIS — Z9221 Personal history of antineoplastic chemotherapy: Secondary | ICD-10-CM

## 2019-08-21 DIAGNOSIS — Z801 Family history of malignant neoplasm of trachea, bronchus and lung: Secondary | ICD-10-CM

## 2019-08-21 DIAGNOSIS — I10 Essential (primary) hypertension: Secondary | ICD-10-CM | POA: Diagnosis not present

## 2019-08-21 DIAGNOSIS — I480 Paroxysmal atrial fibrillation: Secondary | ICD-10-CM | POA: Diagnosis present

## 2019-08-21 DIAGNOSIS — Z87891 Personal history of nicotine dependence: Secondary | ICD-10-CM

## 2019-08-21 DIAGNOSIS — Z823 Family history of stroke: Secondary | ICD-10-CM

## 2019-08-21 DIAGNOSIS — Z8249 Family history of ischemic heart disease and other diseases of the circulatory system: Secondary | ICD-10-CM

## 2019-08-21 DIAGNOSIS — J449 Chronic obstructive pulmonary disease, unspecified: Secondary | ICD-10-CM | POA: Diagnosis present

## 2019-08-21 DIAGNOSIS — Z8052 Family history of malignant neoplasm of bladder: Secondary | ICD-10-CM

## 2019-08-21 DIAGNOSIS — Z7901 Long term (current) use of anticoagulants: Secondary | ICD-10-CM

## 2019-08-21 DIAGNOSIS — R600 Localized edema: Secondary | ICD-10-CM | POA: Diagnosis not present

## 2019-08-21 DIAGNOSIS — Z049 Encounter for examination and observation for unspecified reason: Secondary | ICD-10-CM | POA: Diagnosis not present

## 2019-08-21 DIAGNOSIS — S8991XA Unspecified injury of right lower leg, initial encounter: Secondary | ICD-10-CM

## 2019-08-21 DIAGNOSIS — S8002XA Contusion of left knee, initial encounter: Secondary | ICD-10-CM | POA: Diagnosis present

## 2019-08-21 LAB — COMPREHENSIVE METABOLIC PANEL
ALT: 19 U/L (ref 0–44)
AST: 24 U/L (ref 15–41)
Albumin: 3.9 g/dL (ref 3.5–5.0)
Alkaline Phosphatase: 43 U/L (ref 38–126)
Anion gap: 11 (ref 5–15)
BUN: 10 mg/dL (ref 8–23)
CO2: 25 mmol/L (ref 22–32)
Calcium: 9 mg/dL (ref 8.9–10.3)
Chloride: 103 mmol/L (ref 98–111)
Creatinine, Ser: 0.8 mg/dL (ref 0.61–1.24)
GFR calc Af Amer: >60 mL/min (ref >60)
GFR calc non Af Amer: >60 mL/min (ref >60)
Glucose, Bld: 139 mg/dL — ABNORMAL HIGH (ref 70–99)
Potassium: 4.5 mmol/L (ref 3.5–5.1)
Sodium: 139 mmol/L (ref 135–145)
Total Bilirubin: 1.8 mg/dL — ABNORMAL HIGH (ref 0.3–1.2)
Total Protein: 6.1 g/dL — ABNORMAL LOW (ref 6.5–8.1)

## 2019-08-21 LAB — CBC WITH DIFFERENTIAL/PLATELET
Abs Immature Granulocytes: 0.1 10*3/uL — ABNORMAL HIGH (ref 0.00–0.07)
Basophils Absolute: 0.1 10*3/uL (ref 0.0–0.1)
Basophils Relative: 0 %
Eosinophils Absolute: 0 10*3/uL (ref 0.0–0.5)
Eosinophils Relative: 0 %
HCT: 45.3 % (ref 39.0–52.0)
Hemoglobin: 14.9 g/dL (ref 13.0–17.0)
Immature Granulocytes: 1 %
Lymphocytes Relative: 4 %
Lymphs Abs: 0.8 10*3/uL (ref 0.7–4.0)
MCH: 32.2 pg (ref 26.0–34.0)
MCHC: 32.9 g/dL (ref 30.0–36.0)
MCV: 97.8 fL (ref 80.0–100.0)
Monocytes Absolute: 2.5 10*3/uL — ABNORMAL HIGH (ref 0.1–1.0)
Monocytes Relative: 14 %
Neutro Abs: 15 10*3/uL — ABNORMAL HIGH (ref 1.7–7.7)
Neutrophils Relative %: 81 %
Platelets: 211 10*3/uL (ref 150–400)
RBC: 4.63 MIL/uL (ref 4.22–5.81)
RDW: 12.4 % (ref 11.5–15.5)
WBC: 18.4 10*3/uL — ABNORMAL HIGH (ref 4.0–10.5)
nRBC: 0 % (ref 0.0–0.2)

## 2019-08-21 LAB — PROTIME-INR
INR: 2.7 — ABNORMAL HIGH (ref 0.8–1.2)
Prothrombin Time: 28.7 seconds — ABNORMAL HIGH (ref 11.4–15.2)

## 2019-08-21 MED ORDER — MORPHINE SULFATE (PF) 4 MG/ML IV SOLN
4.0000 mg | Freq: Once | INTRAVENOUS | Status: AC
  Administered 2019-08-21: 4 mg via INTRAVENOUS
  Filled 2019-08-21: qty 1

## 2019-08-21 MED ORDER — ROSUVASTATIN CALCIUM 20 MG PO TABS
20.0000 mg | ORAL_TABLET | Freq: Every day | ORAL | Status: DC
  Administered 2019-08-22 – 2019-08-24 (×3): 20 mg via ORAL
  Filled 2019-08-21 (×4): qty 1

## 2019-08-21 MED ORDER — VITAMIN C 500 MG PO TABS
500.0000 mg | ORAL_TABLET | Freq: Every day | ORAL | Status: DC
  Administered 2019-08-22 – 2019-08-25 (×4): 500 mg via ORAL
  Filled 2019-08-21 (×3): qty 1

## 2019-08-21 MED ORDER — ACETAMINOPHEN 500 MG PO TABS
500.0000 mg | ORAL_TABLET | Freq: Every evening | ORAL | Status: DC | PRN
  Administered 2019-08-22: 500 mg via ORAL
  Filled 2019-08-21: qty 1

## 2019-08-21 MED ORDER — ACETAMINOPHEN 650 MG RE SUPP: 650.0000 mg | Freq: Four times a day (QID) | RECTAL | Status: DC | PRN

## 2019-08-21 MED ORDER — ONDANSETRON HCL 4 MG PO TABS: 4.0000 mg | ORAL_TABLET | Freq: Four times a day (QID) | ORAL | Status: DC | PRN

## 2019-08-21 MED ORDER — ALBUTEROL SULFATE (2.5 MG/3ML) 0.083% IN NEBU: 2.5000 mg | INHALATION_SOLUTION | Freq: Four times a day (QID) | RESPIRATORY_TRACT | Status: DC | PRN

## 2019-08-21 MED ORDER — ACETAMINOPHEN 325 MG PO TABS: 650.0000 mg | ORAL_TABLET | Freq: Four times a day (QID) | ORAL | Status: DC | PRN

## 2019-08-21 MED ORDER — METOPROLOL SUCCINATE ER 25 MG PO TB24
25.0000 mg | ORAL_TABLET | Freq: Every day | ORAL | Status: DC
  Administered 2019-08-22 – 2019-08-25 (×4): 25 mg via ORAL
  Filled 2019-08-21 (×4): qty 1

## 2019-08-21 MED ORDER — TAMSULOSIN HCL 0.4 MG PO CAPS
0.4000 mg | ORAL_CAPSULE | Freq: Every day | ORAL | Status: DC
  Administered 2019-08-22 – 2019-08-24 (×3): 0.4 mg via ORAL
  Filled 2019-08-21 (×4): qty 1

## 2019-08-21 MED ORDER — PANTOPRAZOLE SODIUM 40 MG PO TBEC
40.0000 mg | DELAYED_RELEASE_TABLET | Freq: Every day | ORAL | Status: DC
  Administered 2019-08-22 – 2019-08-25 (×4): 40 mg via ORAL
  Filled 2019-08-21 (×4): qty 1

## 2019-08-21 MED ORDER — GABAPENTIN 300 MG PO CAPS
300.0000 mg | ORAL_CAPSULE | Freq: Every day | ORAL | Status: DC
  Administered 2019-08-22 – 2019-08-25 (×4): 300 mg via ORAL
  Filled 2019-08-21 (×4): qty 1

## 2019-08-21 MED ORDER — DIPHENHYDRAMINE HCL 25 MG PO CAPS
25.0000 mg | ORAL_CAPSULE | Freq: Every evening | ORAL | Status: DC | PRN
  Administered 2019-08-22: 25 mg via ORAL
  Filled 2019-08-21: qty 1

## 2019-08-21 MED ORDER — SPIRONOLACTONE 25 MG PO TABS
25.0000 mg | ORAL_TABLET | Freq: Every day | ORAL | Status: DC
  Administered 2019-08-22: 25 mg via ORAL
  Filled 2019-08-21: qty 1

## 2019-08-21 MED ORDER — DIPHENHYDRAMINE-APAP (SLEEP) 25-500 MG PO TABS: 1.0000 | ORAL_TABLET | Freq: Every evening | ORAL | Status: DC | PRN

## 2019-08-21 MED ORDER — NITROGLYCERIN 0.4 MG SL SUBL: 0.4000 mg | SUBLINGUAL_TABLET | SUBLINGUAL | Status: DC | PRN

## 2019-08-21 MED ORDER — ONDANSETRON HCL 4 MG/2ML IJ SOLN: 4.0000 mg | Freq: Four times a day (QID) | INTRAMUSCULAR | Status: DC | PRN

## 2019-08-21 MED ORDER — OXYCODONE-ACETAMINOPHEN 5-325 MG PO TABS
1.0000 | ORAL_TABLET | Freq: Once | ORAL | Status: AC
  Administered 2019-08-21: 1 via ORAL
  Filled 2019-08-21: qty 1

## 2019-08-21 MED ORDER — FLUTICASONE PROPIONATE 50 MCG/ACT NA SUSP
2.0000 | Freq: Every day | NASAL | Status: DC
  Administered 2019-08-23 – 2019-08-24 (×2): 2 via NASAL
  Filled 2019-08-21: qty 16

## 2019-08-21 NOTE — H&P (Addendum)
History and Physical    Larry Bautista [id number] DOB: [birth year] DOA: 08/21/2019  PCP: [name], MD  Patient coming from: Home.  Chief Complaint: Left knee pain.  HPI: Larry Bautista is a 78 y.o. male with history of persistent atrial fibrillation, CAD status post stent, hyperlipidemia, hypertension presents to the ER after patient hurt his leg when he was trying to get into the car.  Patient states he twisted his left knee when he was trying to get into the car almost 24 hours ago.  The pain and swelling has gradually worsened and decided to come to the ER at the point when patient was unable to walk on it.  Denies any fever chills shortness of breath chest pain.  ED Course: In the ER on exam patient's left knee looks swollen.  X-ray shows large left knee effusion and no fractures.  ER physician discussed with on-call orthopedic surgeon Dr. Rolena Infante who at this time advised patient will need further work-up as outpatient but since patient unable to ambulate will need admission for observation and physical therapy.  Pain management.  To reconsult patient's PA in the morning.  Labs show leukocytosis 18.4 COVID-19 test was negative.  INR was therapeutic at 2.7.  Review of Systems: As per HPI, rest all negative.   Past Medical History:  Diagnosis Date  . Allergy   . CHF (congestive heart failure) ([place])   . Chronic knee pain   . Coronary artery disease    with LAD DE stent 2004  . Heel spur, left   . Hyperlipidemia   . Hypertension   . Kidney stones   . Lower back pain    status post surgery for spondylolisthesis  . Migraine   . Persistent atrial fibrillation ([place])    longstanding persistent (since 05/2008)  . Plantar fasciitis     Past Surgical History:  Procedure Laterality Date  . BACK SURGERY     spondylolisthesis  . CARDIAC CATHETERIZATION  03/06/2003  . CORONARY ANGIOPLASTY WITH STENT PLACEMENT  04/2003   CYPHER stent implantation in the LAD  .  ESOPHAGOGASTRODUODENOSCOPY (EGD) WITH PROPOFOL N/A 01/07/2018   Procedure: ESOPHAGOGASTRODUODENOSCOPY (EGD) WITH PROPOFOL;  Surgeon: Wonda Horner, MD;  Location: [facility];  Service: Endoscopy;  Laterality: N/A;  . HAND SURGERY Right 2014   carpal tunnel  . SKIN CANCER EXCISION  1980's   Face  . VASCULAR SURGERY     Vascular Stent     reports that he quit smoking about 29 years ago. His smoking use included cigarettes. He started smoking about 64 years ago. He has a 70.00 pack-year smoking history. He has never used smokeless tobacco. He reports previous alcohol use of about 2.0 standard drinks of alcohol per week. He reports that he does not use drugs.  Allergies  Allergen Reactions  . Celecoxib Rash  . Lisinopril Cough  . Simvastatin Other (See Comments)    Leg pain    Family History  Problem Relation Age of Onset  . Heart attack Father   . Colon cancer Father   . Congestive Heart Failure Father   . Atrial fibrillation Brother   . Stroke Brother   . Bladder Cancer Brother   . Lung cancer Mother   . Atrial fibrillation Brother   . COPD Brother   . Congestive Heart Failure Brother   . Lung cancer Brother   . Migraines Daughter     Prior to Admission medications   Medication Sig Start Date  End Date Taking? Authorizing Provider  albuterol (PROAIR HFA) 108 (90 Base) MCG/ACT inhaler Inhale 1-2 puffs into the lungs every 6 (six) hours as needed for wheezing or shortness of breath. 12/08/17  Yes Noralee Space, MD  Ascorbic Acid (VITAMIN C PO) Take 1 tablet by mouth daily.   Yes [provider]  cetirizine (ZYRTEC) 10 MG tablet Take 10 mg by mouth daily.   Yes [provider]  diphenhydramine-acetaminophen (TYLENOL PM) 25-500 MG TABS Take 1 tablet by mouth at bedtime as needed (for sleep).    Yes [provider]  eplerenone (INSPRA) 25 MG tablet TAKE ONE (1) TABLET EACH DAY Patient taking differently: Take 25 mg by mouth daily.  07/07/19  Yes Belva Crome, MD  fluticasone [facility]) 50 MCG/ACT nasal spray USE 2 SPRAYS IN [facility] NOSTRIL DAILY 05/30/19  Yes [name], MD  gabapentin (NEURONTIN) 300 MG capsule Take 300 mg by mouth daily.    Yes [provider]  metoprolol succinate (TOPROL-XL) 25 MG 24 hr tablet TAKE ONE (1) TABLET EACH DAY Patient taking differently: Take 25 mg by mouth daily.  07/07/19  Yes Belva Crome, MD  pantoprazole (PROTONIX) 40 MG tablet TAKE ONE (1) TABLET EACH DAY Patient taking differently: Take 40 mg by mouth daily.  06/13/19  Yes Ladell Pier, MD  rosuvastatin (CRESTOR) 20 MG tablet Take 1 tablet (20 mg total) by mouth daily. 06/09/19  Yes Belva Crome, MD  tamsulosin (FLOMAX) 0.4 MG CAPS capsule Take 0.4 mg by mouth daily after supper.   Yes [provider]  vitamin C (ASCORBIC ACID) 500 MG tablet Take 500 mg by mouth daily.   Yes [provider]  warfarin (COUMADIN) 2.5 MG tablet Take as directed by coumadin clinic Patient taking differently: Take 1.25 mg by mouth daily.  01/27/19  Yes Belva Crome, MD  nitroGLYCERIN (NITROSTAT) 0.4 MG SL tablet Place 1 tablet (0.4 mg total) under the tongue every 5 (five) minutes as needed. 06/06/19   Belva Crome, MD  psyllium (METAMUCIL) 58.6 % powder Take 1 packet by mouth daily.     [provider]    Physical Exam: Constitutional: Moderately built and nourished. Vitals:   08/21/19 1704 08/21/19 1817 08/21/19 2010 08/21/19 2109  BP:  (!) 170/100 126/78 (!) 152/101  Pulse:  88 83 84  Resp:  20 18 14   Temp:  98.8 F (37.1 C)  99.1 F (37.3 C)  TempSrc:    Oral  SpO2:  99% 96% 97%  Weight: 79.4 kg     Height: 5\' 8"  (1.727 m)      Eyes: Anicteric no pallor. ENMT: No discharge from the ears eyes nose or mouth. Neck: No mass or.  No neck rigidity. Respiratory: No rhonchi or crepitations. Cardiovascular: S1-S2 heard. Abdomen: Nontender bowel sounds present. Musculoskeletal: Left knee is in knee immobilizer. Skin:  No rash. Neurologic: Alert awake oriented to time place and person.  Moves all extremities. Psychiatric: Appears normal per normal affect.   Labs on Admission: I have personally reviewed following labs and imaging studies  CBC: Recent Labs  Lab 08/21/19 2032  WBC 18.4*  NEUTROABS 15.0*  HGB 14.9  HCT 45.3  MCV 97.8  PLT 123456   Basic Metabolic Panel: Recent Labs  Lab 08/21/19 2032  NA 139  K 4.5  CL 103  CO2 25  GLUCOSE 139*  BUN 10  CREATININE 0.80  CALCIUM 9.0   GFR: Estimated Creatinine Clearance:  72.4 mL/min (by C-G formula based on SCr of 0.8 mg/dL). Liver Function Tests: Recent Labs  Lab 08/21/19 2032  AST 24  ALT 19  ALKPHOS 43  BILITOT 1.8*  PROT 6.1*  ALBUMIN 3.9   No results for input(s): LIPASE, AMYLASE in the last 168 hours. No results for input(s): AMMONIA in the last 168 hours. Coagulation Profile: Recent Labs  Lab 08/21/19 2032  INR 2.7*   Cardiac Enzymes: No results for input(s): CKTOTAL, CKMB, CKMBINDEX, TROPONINI in the last 168 hours. BNP (last 3 results) No results for input(s): PROBNP in the last 8760 hours. HbA1C: No results for input(s): HGBA1C in the last 72 hours. CBG: No results for input(s): GLUCAP in the last 168 hours. Lipid Profile: No results for input(s): CHOL, HDL, LDLCALC, TRIG, CHOLHDL, LDLDIRECT in the last 72 hours. Thyroid Function Tests: No results for input(s): TSH, T4TOTAL, FREET4, T3FREE, THYROIDAB in the last 72 hours. Anemia Panel: No results for input(s): VITAMINB12, FOLATE, FERRITIN, TIBC, IRON, RETICCTPCT in the last 72 hours. Urine analysis: No results found for: COLORURINE, APPEARANCEUR, LABSPEC, PHURINE, GLUCOSEU, HGBUR, BILIRUBINUR, KETONESUR, PROTEINUR, UROBILINOGEN, NITRITE, LEUKOCYTESUR Sepsis Labs: @LABRCNTIP (procalcitonin:4,lacticidven:4) )No results found for this or any previous visit (from the past 240 hour(s)).   Radiological Exams on Admission: DG Knee Complete 4 Views Left  Result  Date: 08/21/2019 CLINICAL DATA:  Left knee pain, twisting injury EXAM: LEFT KNEE - COMPLETE 4+ VIEW COMPARISON:  None. FINDINGS: No fracture or dislocation of the left knee. There is mild tricompartmental joint space narrowing and osteophytosis. Chondrocalcinosis. There is a large, nonspecific knee joint effusion. Soft tissue edema about the anterior and medial knee. IMPRESSION: No fracture or dislocation of the left knee. Large, nonspecific knee joint effusion and soft tissue edema about the anterior and medial knee. Electronically Signed   By: Eddie Candle M.D.   On: 08/21/2019 16:39    Assessment/Plan Principal Problem:   Knee pain Active Problems:   Essential hypertension   CAD in native artery   AF (paroxysmal atrial fibrillation) (HCC)   COPD with chronic bronchitis ([place])    1. Left knee pain and swelling  -no obvious fractures seen in the x-ray.  X-ray does show a large knee effusion.  Given that patient is on anticoagulation could be hematoma.  Patient refused aspiration of the knee.  ER physician discussed with Dr. Rolena Infante orthopedic on-call who advised knee immobilizer and follow-up as outpatient but since patient has significant pain on walking will need admission for further observation and pain management. 2. Leukocytosis likely reactionary.  Patient is afebrile.  If knee pain and WBC gets worsen may need to get further opinion per orthopedics and further imaging.  Patient refused aspiration in the ER. 3. A. fib on beta-blockers and Coumadin.  Coumadin is therapeutic.  In case patient may need procedure will keep patient on heparin when INR is less than 2. 4. CAD denies any chest pain warfarin statins and beta-blockers. 5. COPD not actively wheezing. 6. History of CHF on eplerenone.  COVID-19 test was negative.   DVT prophylaxis: On Coumadin/heparin. Code Status: Full code. Family Communication: Patient's wife. Disposition Plan: To be determined. Consults called: Dr. Rolena Infante  orthopedics was consulted. Admission status: Observation.   Rise Patience MD [facility] Pager [phone].  If 7PM-7AM, please contact night-coverage [email or web address] Password [facility]  08/21/2019, 10:47 PM

## 2019-08-21 NOTE — ED Triage Notes (Addendum)
Pt arrives via [place] from home, pt states he was pumping gas yesterday and went to turn to get back in his car when he twisted his L knee, states after getting home he had to go up some steps and heard a pop in his knee. Pt having swelling, firmness around knee and severe knee pain since yesterday. Knee is warm to the touch without erythema. Pt unable to sit, stating bending at the waist increases pain. currently on coumadin.  a/ox4.pt received 10mcg fentanyl pta.  20g RAC IV started via ems.

## 2019-08-21 NOTE — ED Provider Notes (Signed)
[facility] EMERGENCY DEPARTMENT Provider Note   CSN: [id number] Arrival date & time: 08/21/19  1500     History Chief Complaint  Patient presents with  . Larry Bautista    Larry Bautista is a 78 y.o. male.  The history is provided by the patient, the spouse and medical records. No language interpreter was used.  Larry Bautista Location:  Larry Time since incident:  2 days Injury: yes   Mechanism of injury comment:  Twisted it Larry location:  L Larry Bautista details:    Quality:  Aching   Radiates to:  Does not radiate   Severity:  Severe   Onset quality:  Sudden   Duration:  2 days   Timing:  Constant   Progression:  Unchanged Chronicity:  New Tetanus status:  Unknown Prior injury to area:  No Relieved by:  Nothing Worsened by:  Nothing Ineffective treatments:  Acetaminophen Associated symptoms: decreased ROM (with Bautista) and swelling   Associated symptoms: no back Bautista, no fatigue, no fever, no itching, no muscle weakness, no neck Bautista, no numbness, no stiffness and no tingling   Risk factors: no frequent fractures        Past Medical History:  Diagnosis Date  . Allergy   . CHF (congestive heart failure) ([place])   . Chronic Larry Bautista   . Coronary artery disease    with LAD DE stent 2004  . Heel spur, left   . Hyperlipidemia   . Hypertension   . Kidney stones   . Lower back Bautista    status post surgery for spondylolisthesis  . Migraine   . Persistent atrial fibrillation ([place])    longstanding persistent (since 05/2008)  . Plantar fasciitis     Patient Active Problem List   Diagnosis Date Noted  . Osteoarthritis of right shoulder 10/15/2018  . Pulmonary nodule 09/30/2018  . Gastroesophageal cancer ([place]) 01/14/2018  . MRSA nasal colonization 01/12/2018  . Acute GI bleeding 01/07/2018  . Acute blood loss anemia 01/07/2018  . COPD with chronic bronchitis ([place]) 12/08/2017  . Dyspnea 10/01/2016  . Abnormal PFTs 10/01/2016  . Hyperglycemia  03/28/2015  . Allergic rhinitis 01/24/2015  . Arthritis 01/24/2015  . Basal cell carcinoma-1990 01/24/2015  . BPH (benign prostatic hyperplasia) 01/24/2015  . BMI 29.0-29.9,adult 01/24/2015  . H/O renal calculi 01/24/2015  . Headache, migraine 01/24/2015  . AF (paroxysmal atrial fibrillation) ([place]) 01/24/2015  . Spinal stenosis 01/24/2015  . SPL (spondylolisthesis) 01/24/2015  . Benign paroxysmal positional nystagmus 01/24/2015  . Encounter for therapeutic drug monitoring 11/02/2013  . Hyperlipidemia 05/03/2009  . MIGRAINE HEADACHE 05/03/2009  . Essential hypertension 05/03/2009  . CAD in native artery 05/03/2009  . Larry Bautista 05/03/2009  . Backache 05/03/2009  . History of tobacco use 05/03/2009    Past Surgical History:  Procedure Laterality Date  . BACK SURGERY     spondylolisthesis  . CARDIAC CATHETERIZATION  03/06/2003  . CORONARY ANGIOPLASTY WITH STENT PLACEMENT  04/2003   CYPHER stent implantation in the LAD  . ESOPHAGOGASTRODUODENOSCOPY (EGD) WITH PROPOFOL N/A 01/07/2018   Procedure: ESOPHAGOGASTRODUODENOSCOPY (EGD) WITH PROPOFOL;  Surgeon: Wonda Horner, MD;  Location[facility] ENDOSCOPY;  Service: Endoscopy;  Laterality: N/A;  . HAND SURGERY Right 2014   carpal tunnel  . SKIN CANCER EXCISION  1980's   Face  . VASCULAR SURGERY     Vascular Stent       Family History  Problem Relation Age of Onset  . Heart attack Father   .  Colon cancer Father   . Congestive Heart Failure Father   . Atrial fibrillation Brother   . Stroke Brother   . Bladder Cancer Brother   . Lung cancer Mother   . Atrial fibrillation Brother   . COPD Brother   . Congestive Heart Failure Brother   . Lung cancer Brother   . Migraines Daughter     Social History   Tobacco Use  . Smoking status: Former Smoker    Packs/day: 2.00    Years: 35.00    Pack years: 70.00    Types: Cigarettes    Start date: 09/08/1954    Quit date: 11/06/1989    Years since quitting: 29.8  . Smokeless tobacco:  Never Used  . Tobacco comment: Smoked from [phone] (35 years)  Substance Use Topics  . Alcohol use: Not Currently    Alcohol/week: 2.0 standard drinks    Types: 2 Standard drinks or equivalent per week    Comment: 1961-2009 (Last drink 2009)   . Drug use: No    Home Medications Prior to Admission medications   Medication Sig Start Date End Date Taking? Authorizing Provider  albuterol (PROAIR HFA) 108 (90 Base) MCG/ACT inhaler Inhale 1-2 puffs into the lungs every 6 (six) hours as needed for wheezing or shortness of breath. 12/08/17   Noralee Space, MD  cetirizine (ZYRTEC) 10 MG tablet Take 10 mg by mouth daily.    [provider]  diphenhydramine-acetaminophen (TYLENOL PM) 25-500 MG TABS Take 1 tablet by mouth at bedtime as needed (for sleep).     [provider]  eplerenone (INSPRA) 25 MG tablet TAKE ONE (1) TABLET EACH DAY 07/07/19   Belva Crome, MD  fluticasone [facility]) 50 MCG/ACT nasal spray USE 2 SPRAYS IN [facility] NOSTRIL DAILY 05/30/19   [name], MD  gabapentin (NEURONTIN) 300 MG capsule Take 300 mg by mouth every other day.     [provider]  metoprolol succinate (TOPROL-XL) 25 MG 24 hr tablet TAKE ONE (1) TABLET EACH DAY 07/07/19   Belva Crome, MD  nitroGLYCERIN (NITROSTAT) 0.4 MG SL tablet Place 1 tablet (0.4 mg total) under the tongue every 5 (five) minutes as needed. 06/06/19   Belva Crome, MD  pantoprazole (PROTONIX) 40 MG tablet TAKE ONE (1) TABLET EACH DAY 06/13/19   Ladell Pier, MD  psyllium (METAMUCIL) 58.6 % powder Take 1 packet by mouth daily.     [provider]  rosuvastatin (CRESTOR) 20 MG tablet Take 1 tablet (20 mg total) by mouth daily. 06/09/19   Belva Crome, MD  tamsulosin (FLOMAX) 0.4 MG CAPS capsule Take 0.4 mg by mouth daily after supper.    [provider]  vitamin C (ASCORBIC ACID) 500 MG tablet Take 500 mg by mouth daily.    [provider]  warfarin (COUMADIN) 2.5 MG tablet Take as  directed by coumadin clinic 01/27/19   Belva Crome, MD    Allergies    Celecoxib, Lisinopril, and Simvastatin  Review of Systems   Review of Systems  Constitutional: Negative for chills, diaphoresis, fatigue and fever.  HENT: Negative for congestion.   Respiratory: Negative for cough, chest tightness, shortness of breath and wheezing.   Cardiovascular: Negative for chest Bautista and palpitations.  Gastrointestinal: Negative for constipation, diarrhea, nausea and vomiting.  Genitourinary: Negative for flank Bautista.  Musculoskeletal: Positive for joint swelling (L Larry). Negative for back Bautista, neck Bautista, neck stiffness and stiffness.  Skin: Negative for itching,  rash and wound.  Neurological: Negative for headaches.  Psychiatric/Behavioral: Negative for agitation and confusion.  All other systems reviewed and are negative.   Physical Exam Updated Vital Signs BP 112/89   Pulse 84   Temp 100 F (37.8 C) (Oral)   Resp 20   SpO2 95%   Physical Exam Vitals and nursing note reviewed.  Constitutional:      General: He is not in acute distress.    Appearance: He is well-developed. He is not ill-appearing, toxic-appearing or diaphoretic.  HENT:     Head: Normocephalic and atraumatic.  Eyes:     Conjunctiva/sclera: Conjunctivae normal.  Cardiovascular:     Rate and Rhythm: Normal rate and regular rhythm.     Pulses: Normal pulses.     Heart sounds: No murmur.  Pulmonary:     Effort: Pulmonary effort is normal. No respiratory distress.     Breath sounds: Normal breath sounds. No wheezing, rhonchi or rales.  Chest:     Chest wall: No tenderness.  Abdominal:     General: Abdomen is flat. There is no distension.     Palpations: Abdomen is soft.     Tenderness: There is no abdominal tenderness.  Musculoskeletal:        General: Swelling and tenderness present.     Cervical back: Neck supple.     Left Larry: Swelling and effusion present. No erythema, ecchymosis, lacerations or bony  tenderness. Tenderness present. Normal patellar mobility.     Right lower leg: No edema.     Left lower leg: No edema.       Legs:     Comments: Tenderness and swelling on left Larry.  No erythema.  Normal pulse, sensation, and strength distally.  No tenderness in ankle or hip.  No tenderness in the popliteal fossa.  No calf tenderness.  No patellar tenderness.  Skin:    General: Skin is warm and dry.     Capillary Refill: Capillary refill takes less than 2 seconds.  Neurological:     General: No focal deficit present.     Mental Status: He is alert.     Sensory: No sensory deficit.     Motor: No weakness.  Psychiatric:        Mood and Affect: Mood normal.     ED Results / Procedures / Treatments   Labs (all labs ordered are listed, but only abnormal results are displayed) Labs Reviewed  PROTIME-INR - Abnormal; Notable for the following components:      Result Value   Prothrombin Time 28.7 (*)    INR 2.7 (*)    All other components within normal limits  CBC WITH DIFFERENTIAL/PLATELET - Abnormal; Notable for the following components:   WBC 18.4 (*)    Neutro Abs 15.0 (*)    Monocytes Absolute 2.5 (*)    Abs Immature Granulocytes 0.10 (*)    All other components within normal limits  COMPREHENSIVE METABOLIC PANEL - Abnormal; Notable for the following components:   Glucose, Bld 139 (*)    Total Protein 6.1 (*)    Total Bilirubin 1.8 (*)    All other components within normal limits  SARS CORONAVIRUS 2 (TAT 6-24 HRS)  BASIC METABOLIC PANEL  CBC  PROTIME-INR    EKG None  Radiology DG Larry Complete 4 Views Left  Result Date: 08/21/2019 CLINICAL DATA:  Left Larry Bautista, twisting injury EXAM: LEFT Larry - COMPLETE 4+ VIEW COMPARISON:  None. FINDINGS: No fracture or dislocation of the  left Larry. There is mild tricompartmental joint space narrowing and osteophytosis. Chondrocalcinosis. There is a large, nonspecific Larry joint effusion. Soft tissue edema about the anterior and  medial Larry. IMPRESSION: No fracture or dislocation of the left Larry. Large, nonspecific Larry joint effusion and soft tissue edema about the anterior and medial Larry. Electronically Signed   By: Eddie Candle M.D.   On: 08/21/2019 16:39    Procedures Procedures (including critical care time)  Medications Ordered in ED Medications  spironolactone (ALDACTONE) tablet 25 mg (has no administration in time range)  metoprolol succinate (TOPROL-XL) 24 hr tablet 25 mg (has no administration in time range)  nitroGLYCERIN (NITROSTAT) SL tablet 0.4 mg (has no administration in time range)  rosuvastatin (CRESTOR) tablet 20 mg (has no administration in time range)  pantoprazole (PROTONIX) EC tablet 40 mg (has no administration in time range)  tamsulosin (FLOMAX) capsule 0.4 mg (has no administration in time range)  gabapentin (NEURONTIN) capsule 300 mg (has no administration in time range)  vitamin C (ASCORBIC ACID) tablet 500 mg (has no administration in time range)  albuterol (PROVENTIL) (2.5 MG/3ML) 0.083% nebulizer solution 2.5 mg (has no administration in time range)  fluticasone (FLONASE) 50 MCG/ACT nasal spray 2 spray (has no administration in time range)  acetaminophen (TYLENOL) tablet 650 mg (has no administration in time range)    Or  acetaminophen (TYLENOL) suppository 650 mg (has no administration in time range)  ondansetron (ZOFRAN) tablet 4 mg (has no administration in time range)    Or  ondansetron (ZOFRAN) injection 4 mg (has no administration in time range)  diphenhydrAMINE (BENADRYL) capsule 25 mg (has no administration in time range)    And  acetaminophen (TYLENOL) tablet 500 mg (has no administration in time range)  morphine 4 MG/ML injection 4 mg (4 mg Intravenous Given 08/21/19 1728)  oxyCODONE-acetaminophen (PERCOCET/ROXICET) 5-325 MG per tablet 1 tablet (1 tablet Oral Given 08/21/19 1835)  morphine 4 MG/ML injection 4 mg (4 mg Intravenous Given 08/21/19 2013)    ED Course  I  have reviewed the triage vital signs and the nursing notes.  Pertinent labs & imaging results that were available during my care of the patient were reviewed by me and considered in my medical decision making (see chart for details).    MDM Rules/Calculators/A&P                            SIRIUS LUMPP is a 78 y.o. male with a past medical history significant for hypertension, CAD, gastroesophageal cancer status post chemotherapy with residual balance issues, migraines, prior joint arthritis with lingering shoulder pains, paroxysmal atrial fibrillation on Coumadin therapy, COPD, and hyperlipidemia who presents with Larry injury.  Patient reports that yesterday, he was getting back into his vehicle after pumping gas when he already had his right leg in the car and was pivoting on his left Larry on the ground when he felt a sudden severe Bautista began.  He thought he may have felt a pop but did not hear anything.  He reports no new constant numbness or tingling.  He denies any weakness but has been unable to ambulate due to the Bautista.  He reports he tried to walk but fell.  He reports the Bautista is 10 out of 10 in severity and has had left Larry swelling.  He denies any preceding symptoms include no recent Larry Bautista, Larry aching, fevers, chills.  He denies any other injuries.  He was unable to ambulate today and after a fall called EMS for evaluation.  On arrival, patient has severe tenderness and Bautista in his left Larry.  It is swollen.  It is not erythematous on my exam.  He has good pulses, sensation, and strength distally.  No hip tenderness.  No abdominal tenderness or chest tenderness.  Lungs clear.  Patient clearly having severe Bautista on my evaluation with any joint movement.  We had a long discussion and clinically I do not suspect there is any infection.  Patient has had no preceding symptoms and all of this surrounded a nontraumatic twisting pop with Bautista.  I suspect he injured a ligament such as ACL  or MCL versus meniscus or tendon.  However, will get x-ray to look for fracture dislocation.  Low suspicion for a arterial or DVT type injury.  No evidence of infection.  X-ray obtained showing no fracture dislocation.  There is significant effusion seen.  Patient will be given Bautista medicine and placed in the immobilizer.  Will provide crutches.  He will need to call orthopedics for outpatient evaluation and management of this injury.   7:58 PM Patient was given morphine and a Percocet to try to help with his discomfort.  He was placed into a Larry immobilizer and given crutches.  The Ortho tech expressed concern about the ability for the patient to use crutches given his shoulder problems and instability issues however we attempted this.  Patient was unable to stand safely.  We attempted to use a walker and discussed a possible plan of discharge with PTR and case management to help get the patient a walker tomorrow however patient was unable to ambulate with a walker.  Patient still screaming in Bautista and is clearly unable to safely stand or ambulate at this time.  Had a long shared decision made conversation with patient and wife about further management.  She is concerned about his ability to safely ambulate and stand at home given his new Larry injury in the context of his balance issues from chemotherapy and his shoulder problems.  After observing patient try to ambulate, I agree.  We discussed the possibility of performing a Larry aspiration to try and relieve some of the pressure however, as patient is on blood thinners and we have no concern for infection, this was not felt to be appropriate as it would increase a chance for infection and would likely refill with blood/fluid quickly.  Patient's wife and patient agreed not to perform aspiration.  Given his inability to ambulate, we do not feel he is safe to go home at this time.  We will try to have patient admitted for Bautista control and further  management.  He will likely need to see PT/OT and once he is under control from a Bautista standpoint, he will likely need outpatient orthopedics management and MRI to further characterize the soft tissue injuries we suspect.  Hospitalist will be called for admission.  8:22 PM Joe spoke with Dr. Hal Hope who reports he is going to meet the patient to request some blood work redrawn and orthopedics be consulted.  CBC, CMP, INR, and coronavirus test was ordered.  Orthopedics will be called.  Patient will be admitted for further management of his likely soft tissue Larry injury with inability to safely ambulate.  9:43 PM Just spoke with orthopedics who recommended ice and Larry mobilization.  They feel he does not need advanced imaging or MRI tonight but feel that the orthopedic PA  could be called in the morning to have him evaluate and be seen by orthopedics.  They agree that given his inability to safely ambulate, he should be admitted overnight.      Final Clinical Impression(s) / ED Diagnoses Final diagnoses:  Injury of right Larry, initial encounter     Clinical Impression: 1. Injury of right Larry, initial encounter     Disposition: Admit  This note was prepared with assistance of Dragon voice recognition software. Occasional wrong-word or sound-a-like substitutions may have occurred due to the inherent limitations of voice recognition software.     Marcellino Fidalgo, Gwenyth Allegra, MD 08/22/19 0002

## 2019-08-21 NOTE — Progress Notes (Signed)
Orthopedic Tech Progress Note Patient Details:  Larry Bautista February 29, 1941 [id number]  Ortho Devices Type of Ortho Device: Knee Immobilizer, Crutches Ortho Device/Splint Interventions: Application, [name] 08/21/2019, 6:23 PM

## 2019-08-21 NOTE — Progress Notes (Signed)
ANTICOAGULATION CONSULT NOTE - Initial Consult  Pharmacy Consult for Heparin Indication: atrial fibrillation (when INR < 2)  Allergies  Allergen Reactions  . Celecoxib Rash  . Lisinopril Cough  . Simvastatin Other (See Comments)    Leg pain    Patient Measurements: Height: 5\' 8"  (172.7 cm) Weight: 175 lb (79.4 kg) IBW/kg (Calculated) : 68.4   Vital Signs: Temp: 99.1 F (37.3 C) (12/13 2109) Temp Source: Oral (12/13 2109) BP: 152/101 (12/13 2109) Pulse Rate: 84 (12/13 2109)  Labs: Recent Labs    08/21/19 2032  HGB 14.9  HCT 45.3  PLT 211  LABPROT 28.7*  INR 2.7*  CREATININE 0.80    Estimated Creatinine Clearance: 72.4 mL/min (by C-G formula based on SCr of 0.8 mg/dL).   Medical History: Past Medical History:  Diagnosis Date  . Allergy   . CHF (congestive heart failure) ([place])   . Chronic knee pain   . Coronary artery disease    with LAD DE stent 2004  . Heel spur, left   . Hyperlipidemia   . Hypertension   . Kidney stones   . Lower back pain    status post surgery for spondylolisthesis  . Migraine   . Persistent atrial fibrillation ([place])    longstanding persistent (since 05/2008)  . Plantar fasciitis     Medications:  See electronic med rec  Assessment: 78 y.o. M presents with L knee pain. Pt on coumadin PTA for afib. Home dose: 1.25mg  daily - last taken 12/12 2100. INR therapeutic (2.7) on admission.  Pharmacy consulted for heparin when INR < 2 as coumadin being held for possible surgery.  Goal of Therapy:  INR 2-3, Heparin level 0.3-0.7 units/ml Monitor platelets by anticoagulation protocol: Yes   Plan:  Daily INR Heparin when INR < 2  Sherlon Handing, PharmD, BCPS Please see amion for complete clinical pharmacist phone list 08/21/2019,10:58 PM

## 2019-08-22 ENCOUNTER — Encounter (HOSPITAL_COMMUNITY): Payer: Self-pay | Admitting: Internal Medicine

## 2019-08-22 DIAGNOSIS — Z823 Family history of stroke: Secondary | ICD-10-CM | POA: Diagnosis not present

## 2019-08-22 DIAGNOSIS — I480 Paroxysmal atrial fibrillation: Secondary | ICD-10-CM | POA: Diagnosis not present

## 2019-08-22 DIAGNOSIS — Z03818 Encounter for observation for suspected exposure to other biological agents ruled out: Secondary | ICD-10-CM | POA: Diagnosis not present

## 2019-08-22 DIAGNOSIS — E785 Hyperlipidemia, unspecified: Secondary | ICD-10-CM | POA: Diagnosis present

## 2019-08-22 DIAGNOSIS — J449 Chronic obstructive pulmonary disease, unspecified: Secondary | ICD-10-CM

## 2019-08-22 DIAGNOSIS — S8982XA Other specified injuries of left lower leg, initial encounter: Secondary | ICD-10-CM | POA: Diagnosis present

## 2019-08-22 DIAGNOSIS — M25569 Pain in unspecified knee: Secondary | ICD-10-CM | POA: Diagnosis not present

## 2019-08-22 DIAGNOSIS — I110 Hypertensive heart disease with heart failure: Secondary | ICD-10-CM | POA: Diagnosis present

## 2019-08-22 DIAGNOSIS — Z9221 Personal history of antineoplastic chemotherapy: Secondary | ICD-10-CM | POA: Diagnosis not present

## 2019-08-22 DIAGNOSIS — Z87891 Personal history of nicotine dependence: Secondary | ICD-10-CM | POA: Diagnosis not present

## 2019-08-22 DIAGNOSIS — S8002XA Contusion of left knee, initial encounter: Secondary | ICD-10-CM | POA: Diagnosis present

## 2019-08-22 DIAGNOSIS — M25562 Pain in left knee: Secondary | ICD-10-CM | POA: Diagnosis not present

## 2019-08-22 DIAGNOSIS — Z8249 Family history of ischemic heart disease and other diseases of the circulatory system: Secondary | ICD-10-CM | POA: Diagnosis not present

## 2019-08-22 DIAGNOSIS — Z955 Presence of coronary angioplasty implant and graft: Secondary | ICD-10-CM | POA: Diagnosis not present

## 2019-08-22 DIAGNOSIS — I2510 Atherosclerotic heart disease of native coronary artery without angina pectoris: Secondary | ICD-10-CM | POA: Diagnosis present

## 2019-08-22 DIAGNOSIS — Z825 Family history of asthma and other chronic lower respiratory diseases: Secondary | ICD-10-CM | POA: Diagnosis not present

## 2019-08-22 DIAGNOSIS — S8002XD Contusion of left knee, subsequent encounter: Secondary | ICD-10-CM | POA: Diagnosis not present

## 2019-08-22 DIAGNOSIS — N400 Enlarged prostate without lower urinary tract symptoms: Secondary | ICD-10-CM | POA: Diagnosis not present

## 2019-08-22 DIAGNOSIS — M25511 Pain in right shoulder: Secondary | ICD-10-CM | POA: Diagnosis not present

## 2019-08-22 DIAGNOSIS — Z8501 Personal history of malignant neoplasm of esophagus: Secondary | ICD-10-CM | POA: Diagnosis not present

## 2019-08-22 DIAGNOSIS — I4821 Permanent atrial fibrillation: Secondary | ICD-10-CM | POA: Diagnosis present

## 2019-08-22 DIAGNOSIS — Z923 Personal history of irradiation: Secondary | ICD-10-CM | POA: Diagnosis not present

## 2019-08-22 DIAGNOSIS — G92 Toxic encephalopathy: Secondary | ICD-10-CM | POA: Diagnosis not present

## 2019-08-22 DIAGNOSIS — Z7901 Long term (current) use of anticoagulants: Secondary | ICD-10-CM | POA: Diagnosis not present

## 2019-08-22 DIAGNOSIS — S8991XA Unspecified injury of right lower leg, initial encounter: Secondary | ICD-10-CM | POA: Diagnosis not present

## 2019-08-22 DIAGNOSIS — Z8052 Family history of malignant neoplasm of bladder: Secondary | ICD-10-CM | POA: Diagnosis not present

## 2019-08-22 DIAGNOSIS — R41841 Cognitive communication deficit: Secondary | ICD-10-CM | POA: Diagnosis not present

## 2019-08-22 DIAGNOSIS — I509 Heart failure, unspecified: Secondary | ICD-10-CM | POA: Diagnosis present

## 2019-08-22 DIAGNOSIS — M19011 Primary osteoarthritis, right shoulder: Secondary | ICD-10-CM | POA: Diagnosis not present

## 2019-08-22 DIAGNOSIS — Z20828 Contact with and (suspected) exposure to other viral communicable diseases: Secondary | ICD-10-CM | POA: Diagnosis present

## 2019-08-22 DIAGNOSIS — I10 Essential (primary) hypertension: Secondary | ICD-10-CM | POA: Diagnosis not present

## 2019-08-22 DIAGNOSIS — X501XXA Overexertion from prolonged static or awkward postures, initial encounter: Secondary | ICD-10-CM | POA: Diagnosis not present

## 2019-08-22 DIAGNOSIS — M6281 Muscle weakness (generalized): Secondary | ICD-10-CM | POA: Diagnosis not present

## 2019-08-22 DIAGNOSIS — Z801 Family history of malignant neoplasm of trachea, bronchus and lung: Secondary | ICD-10-CM | POA: Diagnosis not present

## 2019-08-22 DIAGNOSIS — T402X5A Adverse effect of other opioids, initial encounter: Secondary | ICD-10-CM | POA: Diagnosis not present

## 2019-08-22 DIAGNOSIS — Z800 Family history of malignant neoplasm of digestive organs: Secondary | ICD-10-CM | POA: Diagnosis not present

## 2019-08-22 DIAGNOSIS — Z66 Do not resuscitate: Secondary | ICD-10-CM | POA: Diagnosis not present

## 2019-08-22 DIAGNOSIS — U071 COVID-19: Secondary | ICD-10-CM | POA: Diagnosis not present

## 2019-08-22 DIAGNOSIS — J309 Allergic rhinitis, unspecified: Secondary | ICD-10-CM | POA: Diagnosis not present

## 2019-08-22 DIAGNOSIS — R2689 Other abnormalities of gait and mobility: Secondary | ICD-10-CM | POA: Diagnosis not present

## 2019-08-22 LAB — CBC
HCT: 43.9 % (ref 39.0–52.0)
Hemoglobin: 14.5 g/dL (ref 13.0–17.0)
MCH: 32.2 pg (ref 26.0–34.0)
MCHC: 33 g/dL (ref 30.0–36.0)
MCV: 97.6 fL (ref 80.0–100.0)
Platelets: 204 10*3/uL (ref 150–400)
RBC: 4.5 MIL/uL (ref 4.22–5.81)
RDW: 12.7 % (ref 11.5–15.5)
WBC: 19.3 10*3/uL — ABNORMAL HIGH (ref 4.0–10.5)
nRBC: 0 % (ref 0.0–0.2)

## 2019-08-22 LAB — BASIC METABOLIC PANEL
Anion gap: 10 (ref 5–15)
BUN: 12 mg/dL (ref 8–23)
CO2: 25 mmol/L (ref 22–32)
Calcium: 8.9 mg/dL (ref 8.9–10.3)
Chloride: 104 mmol/L (ref 98–111)
Creatinine, Ser: 0.73 mg/dL (ref 0.61–1.24)
GFR calc Af Amer: >60 mL/min (ref >60)
GFR calc non Af Amer: >60 mL/min (ref >60)
Glucose, Bld: 157 mg/dL — ABNORMAL HIGH (ref 70–99)
Potassium: 3.9 mmol/L (ref 3.5–5.1)
Sodium: 139 mmol/L (ref 135–145)

## 2019-08-22 LAB — SARS CORONAVIRUS 2 (TAT 6-24 HRS): SARS Coronavirus 2: NEGATIVE

## 2019-08-22 LAB — PROTIME-INR
INR: 2.7 — ABNORMAL HIGH (ref 0.8–1.2)
Prothrombin Time: 28.9 seconds — ABNORMAL HIGH (ref 11.4–15.2)

## 2019-08-22 MED ORDER — MORPHINE SULFATE (PF) 2 MG/ML IV SOLN
2.0000 mg | INTRAVENOUS | Status: DC | PRN
  Administered 2019-08-22: 2 mg via INTRAVENOUS
  Filled 2019-08-22: qty 1

## 2019-08-22 MED ORDER — SODIUM CHLORIDE 0.9 % IV SOLN
INTRAVENOUS | Status: AC
  Administered 2019-08-22: 13:00:00 via INTRAVENOUS

## 2019-08-22 MED ORDER — ACETAMINOPHEN 325 MG PO TABS
650.0000 mg | ORAL_TABLET | Freq: Four times a day (QID) | ORAL | Status: DC
  Administered 2019-08-22 – 2019-08-25 (×8): 650 mg via ORAL
  Filled 2019-08-22 (×9): qty 2

## 2019-08-22 MED ORDER — HYDROCODONE-ACETAMINOPHEN 5-325 MG PO TABS
1.0000 | ORAL_TABLET | Freq: Four times a day (QID) | ORAL | Status: DC | PRN
  Administered 2019-08-23 – 2019-08-24 (×2): 1 via ORAL
  Administered 2019-08-25: 2 via ORAL
  Filled 2019-08-22: qty 2
  Filled 2019-08-22 (×2): qty 1

## 2019-08-22 MED ORDER — VITAMIN K1 10 MG/ML IJ SOLN
5.0000 mg | Freq: Once | INTRAVENOUS | Status: AC
  Administered 2019-08-22: 5 mg via INTRAVENOUS
  Filled 2019-08-22: qty 0.5

## 2019-08-22 MED ORDER — TRAMADOL HCL 50 MG PO TABS
50.0000 mg | ORAL_TABLET | Freq: Two times a day (BID) | ORAL | Status: DC | PRN
  Administered 2019-08-25: 50 mg via ORAL
  Filled 2019-08-22: qty 1

## 2019-08-22 MED ORDER — HYDROMORPHONE HCL 1 MG/ML IJ SOLN
1.0000 mg | INTRAMUSCULAR | Status: DC | PRN
  Administered 2019-08-22: 1 mg via INTRAVENOUS
  Filled 2019-08-22: qty 1

## 2019-08-22 NOTE — Progress Notes (Addendum)
PROGRESS NOTE    Larry Bautista  [id number] DOB: 07-Nov-1940 DOA: 08/21/2019 PCP: Larry Sons, MD  Brief Narrative: 78 year old male with history of persistent atrial fibrillation on Coumadin, CAD/PCI, stents, hypertension, dyslipidemia, history of esophageal CA in remission, presented to the ED with left knee swelling and pain yesterday, he apparently twisted his left knee try to get into his car on saturday, x-ray noted large knee effusion, no fractures. -Patient was unable to ambulate yesterday hence admitted for observation -His white count was 18.4, INR was therapeutic at 2.7  Assessment & Plan:   Large left knee hematoma -Suspect traumatic hematoma, patient has a low-grade temp of 99.9 and leukocytosis of 19 K likely from same, orthopedics consulted -Likely conservative management -Hold Coumadin, let INR drift down, will give vitamin K x1 -Pain control, PT OT  Permanent atrial fibrillation -Rate controlled, continue metoprolol, hold Coumadin, anticoagulation in the setting of large left knee hematoma,  called and discussed risk of stroke while off anticoagulation, wife understands and agrees with holding this for now, she tells me that he was off Coumadin for 3 to 4 months last year during work-up for his esophageal cancer -Will give vitamin K x1  Encephalopathy -Patient is confused, oriented to self only this morning -Baseline mental status is normal per wife -Could be secondary to morphine he received last night -Discontinue IV narcotics, will schedule Tylenol, tramadol as needed and Vicodin for severe pain  CAD -Stable, continue beta-blocker  COPD -Stable, no wheezing, nebs as needed  DVT prophylaxis: INR therapeutic on Coumadin, held Code Status: Discussed CODE STATUS with the wife, patient is DNR Family Communication: No family at bedside, discussed with wife Larry Bautista Disposition Plan: To be determined, may need rehab, patient is elderly, confused and  unable to ambulate at this time, he is not stable for discharge home today  Consultants:   Orthopedics   Procedures:   Antimicrobials:    Subjective: -Confused and disoriented this morning  Objective: Vitals:   08/21/19 2109 08/22/19 0034 08/22/19 0605 08/22/19 0632  BP: (!) 152/101 (!) 150/68 131/61   Pulse: 84 (!) 58 66   Resp: 14 16 16    Temp: 99.1 F (37.3 C) 99.4 F (37.4 C) (!) 97.4 F (36.3 C)   TempSrc: Oral Oral Oral   SpO2: 97% 97% (!) 85% 94%  Weight:      Height:        Intake/Output Summary (Last 24 hours) at 08/22/2019 1131 Last data filed at 08/22/2019 0600 Gross per 24 hour  Intake --  Output 300 ml  Net -300 ml   Filed Weights   08/21/19 1704  Weight: 79.4 kg    Examination:  General exam: Confused, disoriented  respiratory system: Clear Cardiovascular system: S1 & S2 heard, RRR.   Gastrointestinal system: Abdomen is nondistended, soft and nontender.Normal bowel sounds heard. Central nervous system: Alert and oriented. No focal neurological deficits. Extremities: Left knee with large effusion, mild warmth and tenderness Skin: As above Psychiatry: Unable to assess    Data Reviewed:   CBC: Recent Labs  Lab 08/21/19 2032 08/22/19 0538  WBC 18.4* 19.3*  NEUTROABS 15.0*  --   HGB 14.9 14.5  HCT 45.3 43.9  MCV 97.8 97.6  PLT 211 0000000   Basic Metabolic Panel: Recent Labs  Lab 08/21/19 2032 08/22/19 0538  NA 139 139  K 4.5 3.9  CL 103 104  CO2 25 25  GLUCOSE 139* 157*  BUN 10 12  CREATININE 0.80 0.73  CALCIUM 9.0 8.9   GFR: Estimated Creatinine Clearance: 72.4 mL/min (by C-G formula based on SCr of 0.73 mg/dL). Liver Function Tests: Recent Labs  Lab 08/21/19 2032  AST 24  ALT 19  ALKPHOS 43  BILITOT 1.8*  PROT 6.1*  ALBUMIN 3.9   No results for input(s): LIPASE, AMYLASE in the last 168 hours. No results for input(s): AMMONIA in the last 168 hours. Coagulation Profile: Recent Labs  Lab 08/21/19 2032  08/22/19 0538  INR 2.7* 2.7*   Cardiac Enzymes: No results for input(s): CKTOTAL, CKMB, CKMBINDEX, TROPONINI in the last 168 hours. BNP (last 3 results) No results for input(s): PROBNP in the last 8760 hours. HbA1C: No results for input(s): HGBA1C in the last 72 hours. CBG: No results for input(s): GLUCAP in the last 168 hours. Lipid Profile: No results for input(s): CHOL, HDL, LDLCALC, TRIG, CHOLHDL, LDLDIRECT in the last 72 hours. Thyroid Function Tests: No results for input(s): TSH, T4TOTAL, FREET4, T3FREE, THYROIDAB in the last 72 hours. Anemia Panel: No results for input(s): VITAMINB12, FOLATE, FERRITIN, TIBC, IRON, RETICCTPCT in the last 72 hours. Urine analysis: No results found for: COLORURINE, APPEARANCEUR, LABSPEC, PHURINE, GLUCOSEU, HGBUR, BILIRUBINUR, KETONESUR, PROTEINUR, UROBILINOGEN, NITRITE, LEUKOCYTESUR Sepsis Labs: @LABRCNTIP (procalcitonin:4,lacticidven:4)  ) Recent Results (from the past 240 hour(s))  SARS CORONAVIRUS 2 (TAT 6-24 HRS) Nasopharyngeal Nasopharyngeal Swab     Status: None   Collection Time: 08/21/19  8:32 PM   Specimen: Nasopharyngeal Swab  Result Value Ref Range Status   SARS Coronavirus 2 NEGATIVE NEGATIVE Final    Comment: (NOTE) SARS-CoV-2 target nucleic acids are NOT DETECTED. The SARS-CoV-2 RNA is generally detectable in upper and lower respiratory specimens during the acute phase of infection. Negative results do not preclude SARS-CoV-2 infection, do not rule out co-infections with other pathogens, and should not be used as the sole basis for treatment or other patient management decisions. Negative results must be combined with clinical observations, patient history, and epidemiological information. The expected result is Negative. Fact Sheet for Patients: SugarRoll.be Fact Sheet for Healthcare Providers: https://www.woods-mathews.com/ This test is not yet approved or cleared by the [place] FDA and  has been authorized for detection and/or diagnosis of SARS-CoV-2 by FDA under an Emergency Use Authorization (EUA). This EUA will remain  in effect (meaning this test can be used) for the duration of the COVID-19 declaration under Section 56 4(b)(1) of the Act, 21 U.S.C. section 360bbb-3(b)(1), unless the authorization is terminated or revoked sooner. Performed at [facility], [place] [address] [place], [place] [address]          Radiology Studies: DG Knee Complete 4 Views Left  Result Date: 08/21/2019 CLINICAL DATA:  Left knee pain, twisting injury EXAM: LEFT KNEE - COMPLETE 4+ VIEW COMPARISON:  None. FINDINGS: No fracture or dislocation of the left knee. There is mild tricompartmental joint space narrowing and osteophytosis. Chondrocalcinosis. There is a large, nonspecific knee joint effusion. Soft tissue edema about the anterior and medial knee. IMPRESSION: No fracture or dislocation of the left knee. Large, nonspecific knee joint effusion and soft tissue edema about the anterior and medial knee. Electronically Signed   By: Eddie Candle M.D.   On: 08/21/2019 16:39        Scheduled Meds: . fluticasone  2 spray Each Nare Daily  . gabapentin  300 mg Oral Daily  . metoprolol succinate  25 mg Oral Daily  . pantoprazole  40 mg Oral Daily  . rosuvastatin  20 mg Oral q1800  .  tamsulosin  0.4 mg Oral QPC supper  . vitamin C  500 mg Oral Daily   Continuous Infusions: . sodium chloride       LOS: 0 days    Time spent: 42min  Domenic Polite, MD [facility]  08/22/2019, 11:31 AM

## 2019-08-22 NOTE — Evaluation (Signed)
Occupational Therapy Evaluation Patient Details Name: GINO SANTORA MRN: [id number] DOB: [birth year] Today's Date: 08/22/2019    History of Present Illness DEANDRAE TARTAGLIA is a 78 y.o. male with history of persistent atrial fibrillation, CAD status post stent, hyperlipidemia, hypertension presents to the ER after patient hurt his leg when he was trying to get into the car. Per ortho almost certainly a hemarthrosis 2/2 coumadin use   Clinical Impression   This 78 yo male admitted with above presents to acute OT with normally being totally independent with basic ADLs and now is Mod A to Total A for any ADL that requires him to be up on his feet, Max A for a stand pivot transfer, decreased sitting balance and increased pain. He will benefit from acute OT with follow up on CIR to get to a S level or better to go home with wife.    Follow Up Recommendations  CIR;Supervision/Assistance - 24 hour    Equipment Recommendations  3 in 1 bedside commode       Precautions / Restrictions Precautions Precautions: Fall Required Braces or Orthoses: Knee Immobilizer - Left Knee Immobilizer - Left: Other (comment)(for comfort)      Mobility Bed Mobility Overal bed mobility: Needs Assistance Bed Mobility: Supine to Sit     Supine to sit: Max assist;HOB elevated     General bed mobility comments: A for LLE and for trunk as well as step by step sequencing  Transfers Overall transfer level: Needs assistance Equipment used: Rolling walker (2 wheeled) Transfers: Sit to/from [facility] Sit to Stand: Min assist;From elevated surface Stand pivot transfers: Mod assist;Max assist       General transfer comment: started as Mod A and progressed to Max A for stand pivot with step-by-step cues    Balance Overall balance assessment: Needs assistance Sitting-balance support: Single extremity supported;Feet supported Sitting balance-Leahy Scale: Poor Sitting balance - Comments:  posterior lean tendency with intermittent cues to correct   Standing balance support: Bilateral upper extremity supported Standing balance-Leahy Scale: Poor Standing balance comment: reliant on RW and additional external support                           ADL either performed or assessed with clinical judgement   ADL Overall ADL's : Needs assistance/impaired Eating/Feeding: Independent Eating/Feeding Details (indicate cue type and reason): supported sitting Grooming: Set up;Supervision/safety Grooming Details (indicate cue type and reason): supported sitting Upper Body Bathing: Set up;Supervision/ safety Upper Body Bathing Details (indicate cue type and reason): supported sitting Lower Body Bathing: Total assistance Lower Body Bathing Details (indicate cue type and reason): Mod A sit<>stand rasied surface Upper Body Dressing : Minimal assistance Upper Body Dressing Details (indicate cue type and reason): supported sitting Lower Body Dressing: Total assistance Lower Body Dressing Details (indicate cue type and reason): Mod A sit<>stand rasied surface Toilet Transfer: Moderate assistance;Maximal assistance;Stand-pivot;RW Armed forces technical officer Details (indicate cue type and reason): started off as Mod A and progessed to Max A with VCs for sequencing entire tranfer from bed to recliner going to patient's left Toileting- Clothing Manipulation and Hygiene: Total assistance Toileting - Clothing Manipulation Details (indicate cue type and reason): Mod A sit<>stand rasied surface             Vision Patient Visual Report: No change from baseline              Pertinent Vitals/Pain Pain Assessment: 0-10 Pain Score: 6  Pain  Location: left knee with weigh bearing Pain Descriptors / Indicators: Aching;Sore Pain Intervention(s): Limited activity within patient's tolerance;Monitored during session;Repositioned     Hand Dominance Right      Communication  Communication Communication: No difficulties   Cognition Arousal/Alertness: Awake/alert Behavior During Therapy: WFL for tasks assessed/performed Overall Cognitive Status: Impaired/Different from baseline Area of Impairment: Following commands;Problem solving;Safety/judgement                       Following Commands: Follows one step commands with increased time Safety/Judgement: Decreased awareness of safety   Problem Solving: Slow processing;Difficulty sequencing;Requires tactile cues;Requires verbal cues                Home Living Family/patient expects to be discharged to:: Private residence Living Arrangements: Spouse/significant other Available Help at Discharge: Family;Available 24 hours/day Type of Home: House Home Access: Stairs to enter [employer] of Steps: 4 Entrance Stairs-Rails: Right;Left Home Layout: Two level;Bed/bath upstairs Alternate Level Stairs-Number of Steps: usually stay upstairs   Bathroom Shower/Tub: Tub/shower unit;Curtain   Bathroom Toilet: Standard                Prior Functioning/Environment Level of Independence: Independent                 OT Problem List: Decreased strength;Decreased range of motion;Impaired balance (sitting and/or standing);Decreased knowledge of precautions;Pain;Decreased cognition;Decreased safety awareness;Decreased knowledge of use of DME or AE      OT Treatment/Interventions: Self-care/ADL training;DME and/or AE instruction;Patient/family education;Balance training    OT Goals(Current goals can be found in the care plan section) Acute Rehab OT Goals Patient Stated Goal: to go to rehab and then home OT Goal Formulation: With patient/family Time For Goal Achievement: 09/05/19 Potential to Achieve Goals: Good  OT Frequency: Min 2X/week              AM-PAC OT "6 Clicks" Daily Activity     Outcome Measure Help from another person eating meals?: None Help from another person  taking care of personal grooming?: A Little Help from another person toileting, which includes using toliet, bedpan, or urinal?: A Lot Help from another person bathing (including washing, rinsing, drying)?: A Lot Help from another person to put on and taking off regular upper body clothing?: A Little Help from another person to put on and taking off regular lower body clothing?: Total 6 Click Score: 15   End of Session Equipment Utilized During Treatment: Gait belt;Rolling walker;Left knee immobilizer Nurse Communication: Mobility status;Need for lift equipment(sara stedy)  Activity Tolerance: Patient limited by pain Patient left: in chair;with call bell/phone within reach;with chair alarm set;with family/visitor present  OT Visit Diagnosis: Unsteadiness on feet (R26.81);Other abnormalities of gait and mobility (R26.89);Muscle weakness (generalized) (M62.81);Pain Pain - Right/Left: Left Pain - part of body: Knee                Time: [id number] OT Time Calculation (min): 36 min Charges:  OT General Charges $OT Visit: 1 Visit OT Evaluation $OT Eval Moderate Complexity: 1 Mod OT Treatments $Self Care/Home Management : 8-22 mins  [address], OTR/L Acute [employer] Pager [phone] Office [phone]     Almon Register 08/22/2019, 4:35 PM

## 2019-08-22 NOTE — Progress Notes (Signed)
ANTICOAGULATION CONSULT NOTE - Initial Consult  Pharmacy Consult for Heparin Indication: atrial fibrillation (when INR < 2)  Allergies  Allergen Reactions  . Celecoxib Rash  . Lisinopril Cough  . Simvastatin Other (See Comments)    Leg pain    Patient Measurements: Height: 5\' 8"  (172.7 cm) Weight: 175 lb (79.4 kg) IBW/kg (Calculated) : 68.4   Vital Signs: Temp: 97.4 F (36.3 C) (12/14 0605) Temp Source: Oral (12/14 0605) BP: 131/61 (12/14 0605) Pulse Rate: 66 (12/14 0605)  Labs: Recent Labs    08/21/19 2032 08/22/19 0538  HGB 14.9 14.5  HCT 45.3 43.9  PLT 211 204  LABPROT 28.7* 28.9*  INR 2.7* 2.7*  CREATININE 0.80 0.73    Estimated Creatinine Clearance: 72.4 mL/min (by C-G formula based on SCr of 0.73 mg/dL).   Medical History: Past Medical History:  Diagnosis Date  . Allergy   . CHF (congestive heart failure) ([place])   . Chronic knee pain   . Coronary artery disease    with LAD DE stent 2004  . Heel spur, left   . Hyperlipidemia   . Hypertension   . Kidney stones   . Lower back pain    status post surgery for spondylolisthesis  . Migraine   . Persistent atrial fibrillation ([place])    longstanding persistent (since 05/2008)  . Plantar fasciitis     Medications:  See electronic med rec  Assessment: 78 y.o. M presents with L knee pain. Pt on coumadin PTA for afib. Home dose: 1.25mg  daily - last taken 12/12 2100. INR therapeutic (2.7) on admission and again 2.7 this AM Pharmacy consulted for heparin when INR < 2 as coumadin being held for possible surgery.  Goal of Therapy:  INR 2-3, Heparin level 0.3-0.7 units/ml Monitor platelets by anticoagulation protocol: Yes   Plan:  Daily INR Heparin when INR < 2  Toivo Bordon A. Levada Dy, PharmD, BCPS, FNKF Clinical Pharmacist [place] Please utilize Amion for appropriate phone number to reach the unit pharmacist ([place])   08/22/2019,8:39 AM

## 2019-08-22 NOTE — Progress Notes (Signed)
Provider paged. Pt given pain meds with no effectiveness. Pt grimacing, moaning, and verbalized constant L knee pain. Immobilizer in place. Equipment checked and secured. Pt restless and attempting to get out of bed wihtout no assistance. Consistently rolling around in bed. Pt educated about fall prevention, and verbalized will call for help. Pt has poor safety awareness and does not call for assistance. Bed alarm placed. High fall risk band placed on patient. All four rails up in bed to prevent patient from rolling off the bed on the floor while sleeping. Pt verbalized understanding and accepting safety measure in place.

## 2019-08-22 NOTE — Progress Notes (Signed)
Orthopedic Tech Progress Note Patient Details:  Larry Bautista August 03, 1941 [id number]  Ortho Devices Type of Ortho Device: Ace wrap Ortho Device/Splint Location: LLE Ortho Device/Splint Interventions: Application, Ordered   Post Interventions Patient Tolerated: Well Instructions Provided: Care of device, Adjustment of device   [name] 08/22/2019, 1:48 PM

## 2019-08-22 NOTE — Consult Note (Signed)
Reason for Consult:Left knee pain Referring Physician: P Cayl Pike is an 78 y.o. male.  HPI: Larry Bautista was getting out of a car and wrenched his knee. It swelled up and became painful enough that he couldn't ambulate. He was seen in the ED and admitted for pain control and mobilization. Orthopedic surgery was consulted the following day.  Past Medical History:  Diagnosis Date  . Allergy   . CHF (congestive heart failure) ([place])   . Chronic knee pain   . Coronary artery disease    with LAD DE stent 2004  . Heel spur, left   . Hyperlipidemia   . Hypertension   . Kidney stones   . Lower back pain    status post surgery for spondylolisthesis  . Migraine   . Persistent atrial fibrillation ([place])    longstanding persistent (since 05/2008)  . Plantar fasciitis     Past Surgical History:  Procedure Laterality Date  . BACK SURGERY     spondylolisthesis  . CARDIAC CATHETERIZATION  03/06/2003  . CORONARY ANGIOPLASTY WITH STENT PLACEMENT  04/2003   CYPHER stent implantation in the LAD  . ESOPHAGOGASTRODUODENOSCOPY (EGD) WITH PROPOFOL N/A 01/07/2018   Procedure: ESOPHAGOGASTRODUODENOSCOPY (EGD) WITH PROPOFOL;  Surgeon: Wonda Horner, MD;  [facility]: [facility] ENDOSCOPY;  Service: Endoscopy;  Laterality: N/A;  . HAND SURGERY Right 2014   carpal tunnel  . SKIN CANCER EXCISION  1980's   Face  . VASCULAR SURGERY     Vascular Stent    Family History  Problem Relation Age of Onset  . Heart attack Father   . Colon cancer Father   . Congestive Heart Failure Father   . Atrial fibrillation Brother   . Stroke Brother   . Bladder Cancer Brother   . Lung cancer Mother   . Atrial fibrillation Brother   . COPD Brother   . Congestive Heart Failure Brother   . Lung cancer Brother   . Migraines Daughter     Social History:  reports that he quit smoking about 29 years ago. His smoking use included cigarettes. He started smoking about 64 years ago. He has a 70.00 pack-year smoking history. He  has never used smokeless tobacco. He reports previous alcohol use of about 2.0 standard drinks of alcohol per week. He reports that he does not use drugs.  Allergies:  Allergies  Allergen Reactions  . Celecoxib Rash  . Lisinopril Cough  . Simvastatin Other (See Comments)    Leg pain    Medications: I have reviewed the patient's current medications.  Results for orders placed or performed during the hospital encounter of 08/21/19 (from the past 48 hour(s))  Protime-INR     Status: Abnormal   Collection Time: 08/21/19  8:32 PM  Result Value Ref Range   Prothrombin Time 28.7 (H) 11.4 - 15.2 seconds   INR 2.7 (H) 0.8 - 1.2    Comment: (NOTE) INR goal varies based on device and disease states. Performed at [facility], [place] [address] [place], [place] [address]   CBC with Differential     Status: Abnormal   Collection Time: 08/21/19  8:32 PM  Result Value Ref Range   WBC 18.4 (H) 4.0 - 10.5 K/uL   RBC 4.63 4.22 - 5.81 MIL/uL   Hemoglobin 14.9 13.0 - 17.0 g/dL   HCT 45.3 39.0 - 52.0 %   MCV 97.8 80.0 - 100.0 fL   MCH 32.2 26.0 - 34.0 pg   MCHC 32.9  30.0 - 36.0 g/dL   RDW 12.4 11.5 - 15.5 %   Platelets 211 150 - 400 K/uL   nRBC 0.0 0.0 - 0.2 %   Neutrophils Relative % 81 %   Neutro Abs 15.0 (H) 1.7 - 7.7 K/uL   Lymphocytes Relative 4 %   Lymphs Abs 0.8 0.7 - 4.0 K/uL   Monocytes Relative 14 %   Monocytes Absolute 2.5 (H) 0.1 - 1.0 K/uL   Eosinophils Relative 0 %   Eosinophils Absolute 0.0 0.0 - 0.5 K/uL   Basophils Relative 0 %   Basophils Absolute 0.1 0.0 - 0.1 K/uL   Immature Granulocytes 1 %   Abs Immature Granulocytes 0.10 (H) 0.00 - 0.07 K/uL    Comment: Performed at [facility] [address] [place], [place] [address]  Comprehensive metabolic panel     Status: Abnormal   Collection Time: 08/21/19  8:32 PM  Result Value Ref Range   Sodium 139 135 - 145 mmol/L   Potassium 4.5 3.5 - 5.1 mmol/L   Chloride 103 98 - 111 mmol/L   CO2 25 22 - 32 mmol/L    Glucose, Bld 139 (H) 70 - 99 mg/dL   BUN 10 8 - 23 mg/dL   Creatinine, Ser 0.80 0.61 - 1.24 mg/dL   Calcium 9.0 8.9 - 10.3 mg/dL   Total Protein 6.1 (L) 6.5 - 8.1 g/dL   Albumin 3.9 3.5 - 5.0 g/dL   AST 24 15 - 41 U/L   ALT 19 0 - 44 U/L   Alkaline Phosphatase 43 38 - 126 U/L   Total Bilirubin 1.8 (H) 0.3 - 1.2 mg/dL   GFR calc non Af Amer >60 >60 mL/min   GFR calc Af Amer >60 >60 mL/min   Anion gap 11 5 - 15    Comment: Performed at [facility] [address] [place], [place] [address]  SARS CORONAVIRUS 2 (TAT 6-24 HRS) Nasopharyngeal Nasopharyngeal Swab     Status: None   Collection Time: 08/21/19  8:32 PM   Specimen: Nasopharyngeal Swab  Result Value Ref Range   SARS Coronavirus 2 NEGATIVE NEGATIVE    Comment: (NOTE) SARS-CoV-2 target nucleic acids are NOT DETECTED. The SARS-CoV-2 RNA is generally detectable in upper and lower respiratory specimens during the acute phase of infection. Negative results do not preclude SARS-CoV-2 infection, do not rule out co-infections with other pathogens, and should not be used as the sole basis for treatment or other patient management decisions. Negative results must be combined with clinical observations, patient history, and epidemiological information. The expected result is Negative. Fact Sheet for Patients: SugarRoll.be Fact Sheet for Healthcare Providers: https://www.woods-mathews.com/ This test is not yet approved or cleared by the [place] FDA and  has been authorized for detection and/or diagnosis of SARS-CoV-2 by FDA under an Emergency Use Authorization (EUA). This EUA will remain  in effect (meaning this test can be used) for the duration of the COVID-19 declaration under Section 56 4(b)(1) of the Act, 21 U.S.C. section 360bbb-3(b)(1), unless the authorization is terminated or revoked sooner. Performed at [facility], [place] [address] [place], [place] [id number]    Basic metabolic panel     Status: Abnormal   Collection Time: 08/22/19  5:38 AM  Result Value Ref Range   Sodium 139 135 - 145 mmol/L   Potassium 3.9 3.5 - 5.1 mmol/L   Chloride 104 98 - 111 mmol/L   CO2 25 22 - 32 mmol/L  Glucose, Bld 157 (H) 70 - 99 mg/dL   BUN 12 8 - 23 mg/dL   Creatinine, Ser 0.73 0.61 - 1.24 mg/dL   Calcium 8.9 8.9 - 10.3 mg/dL   GFR calc non Af Amer >60 >60 mL/min   GFR calc Af Amer >60 >60 mL/min   Anion gap 10 5 - 15    Comment: Performed at [facility] [address] [place], [place] [address]  CBC     Status: Abnormal   Collection Time: 08/22/19  5:38 AM  Result Value Ref Range   WBC 19.3 (H) 4.0 - 10.5 K/uL   RBC 4.50 4.22 - 5.81 MIL/uL   Hemoglobin 14.5 13.0 - 17.0 g/dL   HCT 43.9 39.0 - 52.0 %   MCV 97.6 80.0 - 100.0 fL   MCH 32.2 26.0 - 34.0 pg   MCHC 33.0 30.0 - 36.0 g/dL   RDW 12.7 11.5 - 15.5 %   Platelets 204 150 - 400 K/uL   nRBC 0.0 0.0 - 0.2 %    Comment: Performed at [facility], [place] [address] [place], [place] [address]  Protime-INR     Status: Abnormal   Collection Time: 08/22/19  5:38 AM  Result Value Ref Range   Prothrombin Time 28.9 (H) 11.4 - 15.2 seconds   INR 2.7 (H) 0.8 - 1.2    Comment: (NOTE) INR goal varies based on device and disease states. Performed at [facility], [place] [address] [place], [place] [address]     DG Knee Complete 4 Views Left  Result Date: 08/21/2019 CLINICAL DATA:  Left knee pain, twisting injury EXAM: LEFT KNEE - COMPLETE 4+ VIEW COMPARISON:  None. FINDINGS: No fracture or dislocation of the left knee. There is mild tricompartmental joint space narrowing and osteophytosis. Chondrocalcinosis. There is a large, nonspecific knee joint effusion. Soft tissue edema about the anterior and medial knee. IMPRESSION: No fracture or dislocation of the left knee. Large, nonspecific knee joint effusion and soft tissue edema about the anterior and medial knee. Electronically Signed   By:  Eddie Candle M.D.   On: 08/21/2019 16:39    Review of Systems  HENT: Negative for ear discharge, ear pain, hearing loss and tinnitus.   Eyes: Negative for photophobia and pain.  Respiratory: Negative for cough and shortness of breath.   Cardiovascular: Negative for chest pain.  Gastrointestinal: Negative for abdominal pain, nausea and vomiting.  Genitourinary: Negative for dysuria, flank pain, frequency and urgency.  Musculoskeletal: Positive for arthralgias (Left knee). Negative for back pain, myalgias and neck pain.  Neurological: Negative for dizziness and headaches.  Hematological: Does not bruise/bleed easily.  Psychiatric/Behavioral: The patient is not nervous/anxious.    Blood pressure (!) 104/53, pulse 71, temperature 97.8 F (36.6 C), temperature source Oral, resp. rate 16, height 5\' 8"  (1.727 m), weight 79.4 kg, SpO2 98 %. Physical Exam  Constitutional: He appears well-developed and well-nourished. No distress.  HENT:  Head: Normocephalic and atraumatic.  Eyes: Conjunctivae are normal. Right eye exhibits no discharge. Left eye exhibits no discharge. No scleral icterus.  Cardiovascular: Normal rate and regular rhythm.  Respiratory: Effort normal. No respiratory distress.  Musculoskeletal:     Cervical back: Normal range of motion.     Comments: LLE No traumatic wounds, ecchymosis, or rash  Mod TTP  No ankle effusion, mod knee effusion  Sens DPN, SPN, TN intact  Motor EHL, ext, flex, evers 5/5  DP 1+, PT 0, No significant  edema  Neurological: He is alert.  Skin: Skin is warm and dry. He is not diaphoretic.  Psychiatric: He has a normal mood and affect. His behavior is normal.    Assessment/Plan: Left knee pain -- Almost certainly a hemarthrosis 2/2 coumadin use. Recommend ACE wrap, KI for comfort, elevation,  and WBAT. I advised him and his wife on the long recovery for these. I recommend against aspiration. He may f/u with Dr. Doran Durand in 3-4 weeks. Multiple medical  problems including persistent atrial fibrillation on Coumadin, CAD/PCI, stents, hypertension, dyslipidemia, and history of esophageal CA in remission -- per primary service    Lisette Abu, PA-C Orthopedic Surgery [phone] 08/22/2019, 1:00 PM

## 2019-08-23 LAB — CBC
HCT: 44.1 % (ref 39.0–52.0)
Hemoglobin: 14.4 g/dL (ref 13.0–17.0)
MCH: 32.3 pg (ref 26.0–34.0)
MCHC: 32.7 g/dL (ref 30.0–36.0)
MCV: 98.9 fL (ref 80.0–100.0)
Platelets: 180 10*3/uL (ref 150–400)
RBC: 4.46 MIL/uL (ref 4.22–5.81)
RDW: 12.5 % (ref 11.5–15.5)
WBC: 16.2 10*3/uL — ABNORMAL HIGH (ref 4.0–10.5)
nRBC: 0 % (ref 0.0–0.2)

## 2019-08-23 LAB — BASIC METABOLIC PANEL
Anion gap: 7 (ref 5–15)
BUN: 11 mg/dL (ref 8–23)
CO2: 28 mmol/L (ref 22–32)
Calcium: 8.9 mg/dL (ref 8.9–10.3)
Chloride: 105 mmol/L (ref 98–111)
Creatinine, Ser: 0.78 mg/dL (ref 0.61–1.24)
GFR calc Af Amer: >60 mL/min (ref >60)
GFR calc non Af Amer: >60 mL/min (ref >60)
Glucose, Bld: 109 mg/dL — ABNORMAL HIGH (ref 70–99)
Potassium: 4.6 mmol/L (ref 3.5–5.1)
Sodium: 140 mmol/L (ref 135–145)

## 2019-08-23 LAB — PROTIME-INR
INR: 1.5 — ABNORMAL HIGH (ref 0.8–1.2)
Prothrombin Time: 17.5 seconds — ABNORMAL HIGH (ref 11.4–15.2)

## 2019-08-23 NOTE — Progress Notes (Addendum)
Rehab Admissions Coordinator Note:  Per OT recommendation, patient was screened by Michel Santee, PT, DPT for appropriateness for an Inpatient Acute Rehab Consult.  At this time, pt does not have the medical necessity to require an admission to a hospital based rehab program.  Recommend f/u at lower level of care.   Michel Santee, PT, DPT 08/23/2019, 9:19 AM  I can be reached at [phone].

## 2019-08-23 NOTE — TOC Initial Note (Addendum)
Transition of Care [facility]) - Initial/Assessment Note    Patient Details  Name: Larry Bautista MRN: [id number] Date of Birth: 01/28/1941  [facility] CM/SW Contact:    Marilu Favre, RN Phone Number: 08/23/2019, 2:14 PM  Clinical Narrative:                 [id number] Patient's wife called back she is also interested in [facility] in [place] and [facility] in [place].   Left messages for [facility] , awaiting call back.   Patient's wife understands she will be given SNF offers and will have to chose from list.   Spoke to patient at bedside. Patient consent for NCM to call wife Rayshon Grudzinski [phone]. Both voiced understanding [facility] has denied due to no medical necessity.   Both are in agreement for SNF.   Preference [facility] , and [facility] but are open to other SNF's. Will fax out and provide bed offers once received.  Spoke to Elyse Hsu at [facility] side she will review referral.  Also called Horris Latino at [facility] [phone] and faxed referral [phone]  Expected Discharge Plan: [place] Barriers to Discharge: Continued Medical Work up   Patient Goals and CMS Choice Patient states their goals for this hospitalization and ongoing recovery are:: to get stronger CMS Medicare.gov Compare Post Acute Care list provided to:: Patient    Expected Discharge Plan and Services Expected Discharge Plan: [place]   Discharge Planning Services: CM Consult Post Acute Care Choice: [place] Living arrangements for the past 2 months: Single Family Home                 DME Arranged: N/A         HH Arranged: NA          Prior Living Arrangements/Services Living arrangements for the past 2 months: Single Family Home Lives with:: Spouse Patient language and need for interpreter  reviewed:: Yes Do you feel safe going back to the place where you live?: Yes(After rehab)      Need for Family Participation in Patient Care: Yes (Comment) Care giver support system in place?: Yes (comment)   Criminal Activity/Legal Involvement Pertinent to Current Situation/Hospitalization: No - Comment as needed  Activities of Daily Living      Permission Sought/Granted   Permission granted to share information with : Yes, Verbal Permission Granted  Share Information with NAME: Cesareo Sotolongo [id number] spouse           Emotional Assessment Appearance:: Appears stated age     Orientation: : Oriented to Self, Oriented to Place, Oriented to  Time, Oriented to Situation Alcohol / Substance Use: Not Applicable Psych Involvement: No (comment)  Admission diagnosis:  Knee pain [M25.569] Injury of right knee, initial encounter [S89.91XA] Hematoma of left knee region [S80.02XA] Patient Active Problem List   Diagnosis Date Noted  . Hematoma of left knee region 08/22/2019  . Knee pain 08/21/2019  . Osteoarthritis of right shoulder 10/15/2018  . Pulmonary nodule 09/30/2018  . Gastroesophageal cancer ([place]) 01/14/2018  . MRSA nasal colonization 01/12/2018  . Acute GI bleeding 01/07/2018  . Acute blood loss anemia 01/07/2018  . COPD with chronic bronchitis ([place]) 12/08/2017  . Dyspnea 10/01/2016  . Abnormal PFTs 10/01/2016  . Hyperglycemia  03/28/2015  . Allergic rhinitis 01/24/2015  . Arthritis 01/24/2015  . Basal cell carcinoma-1990 01/24/2015  . BPH (benign prostatic hyperplasia) 01/24/2015  . BMI 29.0-29.9,adult 01/24/2015  . H/O renal calculi 01/24/2015  . Headache, migraine 01/24/2015  . AF (paroxysmal atrial fibrillation) ([place]) 01/24/2015  . Spinal stenosis 01/24/2015  . SPL (spondylolisthesis) 01/24/2015  . Benign paroxysmal positional nystagmus 01/24/2015  . Encounter for therapeutic drug monitoring 11/02/2013  . Hyperlipidemia 05/03/2009  . MIGRAINE HEADACHE  05/03/2009  . Essential hypertension 05/03/2009  . CAD in native artery 05/03/2009  . KNEE PAIN 05/03/2009  . Backache 05/03/2009  . History of tobacco use 05/03/2009   PCP:  [name], MD Pharmacy:   [facility], [place] [address] Phone: [phone] Fax: [phone]  [id number], [place] [address] [place] [address] Phone: [phone] Fax: [phone]     Social Determinants of Health (SDOH) Interventions    Readmission Risk Interventions No flowsheet data found.

## 2019-08-23 NOTE — Progress Notes (Signed)
PROGRESS NOTE    Larry Bautista  [id number] DOB: 1941-06-13 DOA: 08/21/2019 PCP: [name], MD  Brief Narrative: 78 year old male with history of persistent atrial fibrillation on Coumadin, CAD/PCI, stents, hypertension, dyslipidemia, history of esophageal CA in remission, presented to the ED with left knee swelling and pain 12/13, he apparently twisted his left knee try to get into his car on 12/12, x-ray noted large knee effusion, no fractures. -Patient was unable to ambulate -His white count was 18.4, INR was therapeutic at 2.7  Assessment & Plan:   Large left knee hemarthrosis -Suspect traumatic hematoma, patient has a low-grade temp of 99.9 and leukocytosis of 19 K likely from same, orthopedics consult appreciated, recommended Ace wrap and knee immobilizer for comfort, weightbearing as tolerated -Anticipate he will require long period of recovery -Coumadin discontinued temporarily, see discussion below -Pain control, PT OT, eval completed CIR recommended-declined by CIR this morning -Discussed with wife Vicente Males, she is unable to manage him currently with his limited mobility, will consult social work for short-term rehab  Permanent atrial fibrillation -Rate controlled, continue metoprolol, I held his Coumadin/anticoagulation in the setting of large left knee hemarthrosis, discussed this with the patient and extensively with patient's wife Owynn Thwaites, would recommend holding Coumadin for 2-3 weeks , wife understands that his stroke risk will be higher while off anticoagulation but agrees with this plan, [name] tells me that he was off Coumadin for 3 to 4 months last year during evaluation for esophageal cancer  Encephalopathy -Patient was confused and disoriented yesterday morning in the setting of pain and multiple doses of IV narcotics -Mental status is normal at appropriate per wife at baseline, stopped IV morphine -Continue scheduled Tylenol, tramadol, Vicodin for severe  pain  CAD -Stable, continue beta-blocker  COPD -Stable, no wheezing, nebs as needed  History of esophageal cancer -Status post chemo and XRT in 2019, reportedly in remission per wife  DVT prophylaxis: Coumadin held in the setting of large left knee hemarthrosis  code Status: Discussed CODE STATUS with the wife, patient is DNR Family Communication: No family at bedside, discussed with wife Ariel Dethloff 12/14 and 12/15 Disposition Plan: will need rehab  Consultants:   Orthopedics   Procedures:   Antimicrobials:    Subjective: -awake and alert this morning  Objective: Vitals:   08/22/19 1232 08/22/19 1817 08/22/19 2114 08/23/19 0500  BP: (!) 104/53 122/68 140/80 (!) 145/87  Pulse: 71 79 87 84  Resp: 16 18 18 18   Temp: 97.8 F (36.6 C) (!) 100.7 F (38.2 C) 99.6 F (37.6 C) 98.6 F (37 C)  TempSrc: Oral Oral Oral Oral  SpO2: 98% 98% 95% 98%  Weight:      Height:        Intake/Output Summary (Last 24 hours) at 08/23/2019 1109 Last data filed at 08/22/2019 2121 Gross per 24 hour  Intake 182.3 ml  Output 100 ml  Net 82.3 ml   Filed Weights   08/21/19 1704  Weight: 79.4 kg    Examination:  General exam: Confused, disoriented  respiratory system: Clear Cardiovascular system: S1 & S2 heard, RRR.   Gastrointestinal system: Abdomen is nondistended, soft and nontender.Normal bowel sounds heard. Central nervous system: Alert and oriented. No focal neurological deficits. Extremities: Left knee with large effusion, mild warmth and tenderness Skin: As above Psychiatry: Unable to assess    Data Reviewed:   CBC: Recent Labs  Lab 08/21/19 2032 08/22/19 0538 08/23/19 0629  WBC 18.4* 19.3* 16.2*  NEUTROABS 15.0*  --   --  HGB 14.9 14.5 14.4  HCT 45.3 43.9 44.1  MCV 97.8 97.6 98.9  PLT 211 204 [id number]   Basic Metabolic Panel: Recent Labs  Lab 08/21/19 2032 08/22/19 0538 08/23/19 0629  NA 139 139 140  K 4.5 3.9 4.6  CL 103 104 105  CO2 25 25 28     GLUCOSE 139* 157* 109*  BUN 10 12 11   CREATININE 0.80 0.73 0.78  CALCIUM 9.0 8.9 8.9   GFR: Estimated Creatinine Clearance: 72.4 mL/min (by C-G formula based on SCr of 0.78 mg/dL). Liver Function Tests: Recent Labs  Lab 08/21/19 2032  AST 24  ALT 19  ALKPHOS 43  BILITOT 1.8*  PROT 6.1*  ALBUMIN 3.9   No results for input(s): LIPASE, AMYLASE in the last 168 hours. No results for input(s): AMMONIA in the last 168 hours. Coagulation Profile: Recent Labs  Lab 08/21/19 2032 08/22/19 0538 08/23/19 0629  INR 2.7* 2.7* 1.5*   Cardiac Enzymes: No results for input(s): CKTOTAL, CKMB, CKMBINDEX, TROPONINI in the last 168 hours. BNP (last 3 results) No results for input(s): PROBNP in the last 8760 hours. HbA1C: No results for input(s): HGBA1C in the last 72 hours. CBG: No results for input(s): GLUCAP in the last 168 hours. Lipid Profile: No results for input(s): CHOL, HDL, LDLCALC, TRIG, CHOLHDL, LDLDIRECT in the last 72 hours. Thyroid Function Tests: No results for input(s): TSH, T4TOTAL, FREET4, T3FREE, THYROIDAB in the last 72 hours. Anemia Panel: No results for input(s): VITAMINB12, FOLATE, FERRITIN, TIBC, IRON, RETICCTPCT in the last 72 hours. Urine analysis: No results found for: COLORURINE, APPEARANCEUR, LABSPEC, PHURINE, GLUCOSEU, HGBUR, BILIRUBINUR, KETONESUR, PROTEINUR, UROBILINOGEN, NITRITE, LEUKOCYTESUR Sepsis Labs: @LABRCNTIP (procalcitonin:4,lacticidven:4)  ) Recent Results (from the past 240 hour(s))  SARS CORONAVIRUS 2 (TAT 6-24 HRS) Nasopharyngeal Nasopharyngeal Swab     Status: None   Collection Time: 08/21/19  8:32 PM   Specimen: Nasopharyngeal Swab  Result Value Ref Range Status   SARS Coronavirus 2 NEGATIVE NEGATIVE Final    Comment: (NOTE) SARS-CoV-2 target nucleic acids are NOT DETECTED. The SARS-CoV-2 RNA is generally detectable in upper and lower respiratory specimens during the acute phase of infection. Negative results do not preclude  SARS-CoV-2 infection, do not rule out co-infections with other pathogens, and should not be used as the sole basis for treatment or other patient management decisions. Negative results must be combined with clinical observations, patient history, and epidemiological information. The expected result is Negative. Fact Sheet for Patients: SugarRoll.be Fact Sheet for Healthcare Providers: https://www.woods-mathews.com/ This test is not yet approved or cleared by the [place] FDA and  has been authorized for detection and/or diagnosis of SARS-CoV-2 by FDA under an Emergency Use Authorization (EUA). This EUA will remain  in effect (meaning this test can be used) for the duration of the COVID-19 declaration under Section 56 4(b)(1) of the Act, 21 U.S.C. section 360bbb-3(b)(1), unless the authorization is terminated or revoked sooner. Performed at [facility], [place] [address] [place], [place] [address]          Radiology Studies: DG Knee Complete 4 Views Left  Result Date: 08/21/2019 CLINICAL DATA:  Left knee pain, twisting injury EXAM: LEFT KNEE - COMPLETE 4+ VIEW COMPARISON:  None. FINDINGS: No fracture or dislocation of the left knee. There is mild tricompartmental joint space narrowing and osteophytosis. Chondrocalcinosis. There is a large, nonspecific knee joint effusion. Soft tissue edema about the anterior and medial knee. IMPRESSION: No fracture or dislocation of the left knee. Large, nonspecific knee joint  effusion and soft tissue edema about the anterior and medial knee. Electronically Signed   By: Eddie Candle M.D.   On: 08/21/2019 16:39        Scheduled Meds: . acetaminophen  650 mg Oral QID  . fluticasone  2 spray Each Nare Daily  . gabapentin  300 mg Oral Daily  . metoprolol succinate  25 mg Oral Daily  . pantoprazole  40 mg Oral Daily  . rosuvastatin  20 mg Oral q1800  . tamsulosin  0.4 mg Oral QPC supper  .  vitamin C  500 mg Oral Daily   Continuous Infusions:    LOS: 1 day    Time spent: 59min  Domenic Polite, MD [facility]  08/23/2019, 11:09 AM

## 2019-08-23 NOTE — NC FL2 (Signed)
[place] LEVEL OF CARE SCREENING TOOL     IDENTIFICATION  Patient Name: Larry Bautista Birthdate: 16-Oct-1940 Sex: male Admission Date (Current Location): 08/21/2019  [facility] and [place] Number:  Herbalist and Address:  The [place], [place] [address] [place], [place] [address]      Provider Number: [id number]  Attending Physician Name and Address:  Domenic Polite, MD  Relative Name and Phone Number:  Yeraldo Dorff    Current Level of Care: Hospital Recommended Level of Care: [place] Prior Approval Number:    Date Approved/Denied:   PASRR Number: [id number]  Discharge Plan: SNF    Current Diagnoses: Patient Active Problem List   Diagnosis Date Noted  . Hematoma of left knee region 08/22/2019  . Knee pain 08/21/2019  . Osteoarthritis of right shoulder 10/15/2018  . Pulmonary nodule 09/30/2018  . Gastroesophageal cancer ([place]) 01/14/2018  . MRSA nasal colonization 01/12/2018  . Acute GI bleeding 01/07/2018  . Acute blood loss anemia 01/07/2018  . COPD with chronic bronchitis ([place]) 12/08/2017  . Dyspnea 10/01/2016  . Abnormal PFTs 10/01/2016  . Hyperglycemia 03/28/2015  . Allergic rhinitis 01/24/2015  . Arthritis 01/24/2015  . Basal cell carcinoma-1990 01/24/2015  . BPH (benign prostatic hyperplasia) 01/24/2015  . BMI 29.0-29.9,adult 01/24/2015  . H/O renal calculi 01/24/2015  . Headache, migraine 01/24/2015  . AF (paroxysmal atrial fibrillation) ([place]) 01/24/2015  . Spinal stenosis 01/24/2015  . SPL (spondylolisthesis) 01/24/2015  . Benign paroxysmal positional nystagmus 01/24/2015  . Encounter for therapeutic drug monitoring 11/02/2013  . Hyperlipidemia 05/03/2009  . MIGRAINE HEADACHE 05/03/2009  . Essential hypertension 05/03/2009  . CAD in native artery 05/03/2009  . KNEE PAIN 05/03/2009  . Backache 05/03/2009  . History of tobacco use 05/03/2009    Orientation RESPIRATION BLADDER  Height & Weight     Self, Time, Situation, Place  Normal Continent Weight: 79.4 kg Height:  5\' 8"  (172.7 cm)  BEHAVIORAL SYMPTOMS/MOOD NEUROLOGICAL BOWEL NUTRITION STATUS      Continent Diet  AMBULATORY STATUS COMMUNICATION OF NEEDS Skin   Extensive Assist Verbally Other (Comment)(left knee hemarthrosis , ace wrap, knee immbolizer for comfort , weight bearing as tolerated , knee swollen)                       Personal Care Assistance Level of Assistance  Bathing, Dressing Bathing Assistance: Limited assistance   Dressing Assistance: Limited assistance     Functional Limitations Info             SPECIAL CARE FACTORS FREQUENCY  PT (By licensed PT), OT (By licensed OT)     PT Frequency: five times a week OT Frequency: five times a week            Contractures Contractures Info: Not present    Additional Factors Info  Code Status, Allergies Code Status Info: DNR Allergies Info: celecoxib, lisinopril, simvastatin           Current Medications (08/23/2019):  This is the current hospital active medication list Current Facility-Administered Medications  Medication Dose Route Frequency Provider Last Rate Last Admin  . acetaminophen (TYLENOL) tablet 650 mg  650 mg Oral Q6H PRN Rise Patience, MD       Or  . acetaminophen (TYLENOL) suppository 650 mg  650 mg Rectal Q6H PRN Rise Patience, MD      . acetaminophen (TYLENOL) tablet 500 mg  500 mg Oral QHS PRN  Rise Patience, MD   500 mg at 08/22/19 0518  . acetaminophen (TYLENOL) tablet 650 mg  650 mg Oral QID Domenic Polite, MD   650 mg at 08/23/19 1144  . albuterol (PROVENTIL) (2.5 MG/3ML) 0.083% nebulizer solution 2.5 mg  2.5 mg Inhalation Q6H PRN Rise Patience, MD      . fluticasone [facility]) 50 MCG/ACT nasal spray 2 spray  2 spray Each Nare Daily Rise Patience, MD   2 spray at 08/23/19 [id number]  . gabapentin (NEURONTIN) capsule 300 mg  300 mg Oral Daily Rise Patience, MD   300 mg  at 08/23/19 [id number]  . HYDROcodone-acetaminophen (NORCO/VICODIN) 5-325 MG per tablet 1-2 tablet  1-2 tablet Oral Q6H PRN Domenic Polite, MD   1 tablet at 08/23/19 0110  . metoprolol succinate (TOPROL-XL) 24 hr tablet 25 mg  25 mg Oral Daily Rise Patience, MD   25 mg at 08/23/19 0819  . nitroGLYCERIN (NITROSTAT) SL tablet 0.4 mg  0.4 mg Sublingual Q5 min PRN Rise Patience, MD      . ondansetron [facility]) tablet 4 mg  4 mg Oral Q6H PRN Rise Patience, MD       Or  . ondansetron [facility]) injection 4 mg  4 mg Intravenous Q6H PRN Rise Patience, MD      . pantoprazole (PROTONIX) EC tablet 40 mg  40 mg Oral Daily Rise Patience, MD   40 mg at 08/23/19 [id number]  . rosuvastatin (CRESTOR) tablet 20 mg  20 mg Oral q1800 Rise Patience, MD   20 mg at 08/22/19 1756  . tamsulosin (FLOMAX) capsule 0.4 mg  0.4 mg Oral QPC supper Rise Patience, MD   0.4 mg at 08/22/19 1756  . traMADol (ULTRAM) tablet 50 mg  50 mg Oral Q12H PRN Domenic Polite, MD      . vitamin C (ASCORBIC ACID) tablet 500 mg  500 mg Oral Daily Rise Patience, MD   500 mg at 08/23/19 0820     Discharge Medications: Please see discharge summary for a list of discharge medications.  Relevant Imaging Results:  Relevant Lab Results:   Additional Information SSI [id number] [place]  Nikhil Osei, E[place]on [place], [place]

## 2019-08-23 NOTE — Evaluation (Signed)
Physical Therapy Evaluation Patient Details Name: Larry Bautista MRN: [id number] DOB: 1941-08-15 Today's Date: 08/23/2019   History of Present Illness  Larry Bautista is a 78 y.o. male with history of persistent atrial fibrillation, CAD status post stent, hyperlipidemia, hypertension presents to the ER after patient hurt his leg when he was trying to get into the car. Per ortho almost certainly a hemarthrosis 2/2 coumadin use  Clinical Impression  Pt was in a great deal of pain to move LLE with immobilizer on in bed, as well as with standing on both AD's.  Crutches are not practical yet, as pt is cognitively not following instructions well enough and is distracted by pain on L knee.  Gilford Rile is better to control WB on LLE, but pt is not sure about when and where he came to have some crutches.  Will recommend rehab since pt is not able to move at all unless assisted, and the amount of assistance is significant to be practical for home.  Follow acutely to work on standing, strengthening and control of balance with standing on any AD.    Follow Up Recommendations CIR    Equipment Recommendations  None recommended by PT    Recommendations for Other Services Rehab consult     Precautions / Restrictions Precautions Precautions: Fall Required Braces or Orthoses: Knee Immobilizer - Left Knee Immobilizer - Left: Other (comment) Restrictions Weight Bearing Restrictions: No      Mobility  Bed Mobility Overal bed mobility: Needs Assistance Bed Mobility: Supine to Sit     Supine to sit: Mod assist     General bed mobility comments: mod to support trunk and assist with LLE in immobilizer  Transfers Overall transfer level: Needs assistance Equipment used: Rolling walker (2 wheeled);Crutches Transfers: Sit to/from Stand Sit to Stand: Mod assist;Min assist         General transfer comment: mod to power up initially then min assist, mod to control descent to chair with 100% cues for  sequence  Ambulation/Gait Ambulation/Gait assistance: Mod assist;Min assist Gait Distance (Feet): 3 Feet Assistive device: Rolling walker (2 wheeled);1 person hand held assist Gait Pattern/deviations: Step-to pattern;Decreased stride length;Wide base of support;Trunk flexed Gait velocity: reduced Gait velocity interpretation: <1.31 ft/sec, indicative of household ambulator General Gait Details: dense cues to unload L knee with walker, sliding steps on RLE due to difficulty of accepting wgt on LLE, severe pain  Stairs            Wheelchair Mobility    Modified Rankin (Stroke Patients Only)       Balance Overall balance assessment: Needs assistance Sitting-balance support: Feet supported Sitting balance-Leahy Scale: Fair Sitting balance - Comments: lists back on the bed but finally could contain her sitting balance to fair   Standing balance support: Bilateral upper extremity supported;During functional activity Standing balance-Leahy Scale: Poor Standing balance comment: RW with cues to unload LLE braced leg                             Pertinent Vitals/Pain Pain Assessment: 0-10 Pain Score: 8  Pain Location: left knee with weigh bearing Pain Descriptors / Indicators: Grimacing;Jabbing Pain Intervention(s): Limited activity within patient's tolerance;Monitored during session;Premedicated before session;Repositioned    Home Living Family/patient expects to be discharged to:: Private residence Living Arrangements: Spouse/significant other Available Help at Discharge: Family;Available 24 hours/day Type of Home: House Home Access: Stairs to enter Entrance Stairs-Rails: Right;Left Entrance Stairs-Number of Steps:  4 Home Layout: Two level;Bed/bath upstairs        Prior Function                 Hand Dominance        Extremity/Trunk Assessment                Communication      Cognition Arousal/Alertness: Awake/alert Behavior During  Therapy: WFL for tasks assessed/performed Overall Cognitive Status: Impaired/Different from baseline Area of Impairment: Problem solving;Awareness;Safety/judgement;Following commands;Memory;Attention;Orientation                 Orientation Level: Situation Current Attention Level: Selective Memory: Decreased recall of precautions;Decreased short-term memory Following Commands: Follows one step commands inconsistently;Follows one step commands with increased time Safety/Judgement: Decreased awareness of safety;Decreased awareness of deficits Awareness: Intellectual Problem Solving: Slow processing;Decreased initiation;Requires verbal cues;Requires tactile cues;Difficulty sequencing General Comments: pt reports he has crutches because he was ready to train with them, was home a week before this.        General Comments General comments (skin integrity, edema, etc.): Pt is up to walk with help, noted his distance is contained by pain, tends to forget his sequence without verbal cues    Exercises     Assessment/Plan    PT Assessment Patient needs continued PT services  PT Problem List Decreased strength;Decreased range of motion;Decreased activity tolerance;Decreased balance;Decreased mobility;Decreased coordination;Decreased cognition;Decreased knowledge of use of DME;Decreased safety awareness;Decreased knowledge of precautions;Cardiopulmonary status limiting activity;Obesity;Decreased skin integrity;Pain       PT Treatment Interventions DME instruction;Gait training;Stair training;Functional mobility training;Therapeutic activities;Therapeutic exercise;Balance training;Neuromuscular re-education;Patient/family education    PT Goals (Current goals can be found in the Care Plan section)  Acute Rehab PT Goals Patient Stated Goal: to learn crutches PT Goal Formulation: With patient Time For Goal Achievement: 09/06/19 Potential to Achieve Goals: Good    Frequency Min 2X/week    Barriers to discharge Decreased caregiver support;Inaccessible home environment home with wife, stairs to enter    Co-evaluation               AM-PAC PT "6 Clicks" Mobility  Outcome Measure Help needed turning from your back to your side while in a flat bed without using bedrails?: A Lot Help needed moving from lying on your back to sitting on the side of a flat bed without using bedrails?: A Lot Help needed moving to and from a bed to a chair (including a wheelchair)?: A Lot Help needed standing up from a chair using your arms (e.g., wheelchair or bedside chair)?: A Lot Help needed to walk in hospital room?: A Lot Help needed climbing 3-5 steps with a railing? : Total 6 Click Score: 11    End of Session Equipment Utilized During Treatment: Gait belt Activity Tolerance: Patient limited by fatigue;Treatment limited secondary to medical complications (Comment) Patient left: in chair;with call bell/phone within reach;with chair alarm set;Other (comment)(nursing notified about being up and how to help back to bed) Nurse Communication: Mobility status PT Visit Diagnosis: Unsteadiness on feet (R26.81);Muscle weakness (generalized) (M62.81);Difficulty in walking, not elsewhere classified (R26.2)    Time: [id number] PT Time Calculation (min) (ACUTE ONLY): 38 min   Charges:   PT Evaluation $PT Eval Moderate Complexity: 1 Mod PT Treatments $Gait Training: 8-22 mins $Therapeutic Activity: 8-22 mins       Ramond Dial 08/23/2019, 1:24 PM   Mee Hives, PT MS Acute Rehab Dept. Number: [place] and [place]

## 2019-08-24 DIAGNOSIS — S8002XA Contusion of left knee, initial encounter: Secondary | ICD-10-CM

## 2019-08-24 LAB — CBC
HCT: 44.9 % (ref 39.0–52.0)
Hemoglobin: 14.6 g/dL (ref 13.0–17.0)
MCH: 31.8 pg (ref 26.0–34.0)
MCHC: 32.5 g/dL (ref 30.0–36.0)
MCV: 97.8 fL (ref 80.0–100.0)
Platelets: 206 10*3/uL (ref 150–400)
RBC: 4.59 MIL/uL (ref 4.22–5.81)
RDW: 12.3 % (ref 11.5–15.5)
WBC: 13.9 10*3/uL — ABNORMAL HIGH (ref 4.0–10.5)
nRBC: 0 % (ref 0.0–0.2)

## 2019-08-24 LAB — BASIC METABOLIC PANEL
Anion gap: 9 (ref 5–15)
BUN: 12 mg/dL (ref 8–23)
CO2: 28 mmol/L (ref 22–32)
Calcium: 9.3 mg/dL (ref 8.9–10.3)
Chloride: 104 mmol/L (ref 98–111)
Creatinine, Ser: 0.88 mg/dL (ref 0.61–1.24)
GFR calc Af Amer: >60 mL/min (ref >60)
GFR calc non Af Amer: >60 mL/min (ref >60)
Glucose, Bld: 122 mg/dL — ABNORMAL HIGH (ref 70–99)
Potassium: 4.2 mmol/L (ref 3.5–5.1)
Sodium: 141 mmol/L (ref 135–145)

## 2019-08-24 LAB — PROTIME-INR
INR: 1.2 (ref 0.8–1.2)
Prothrombin Time: 15.4 seconds — ABNORMAL HIGH (ref 11.4–15.2)

## 2019-08-24 LAB — SARS CORONAVIRUS 2 (TAT 6-24 HRS): SARS Coronavirus 2: NEGATIVE

## 2019-08-24 MED ORDER — ENOXAPARIN SODIUM 80 MG/0.8ML ~~LOC~~ SOLN: 80.0000 mg | Freq: Two times a day (BID) | SUBCUTANEOUS | Status: DC

## 2019-08-24 MED ORDER — METHOCARBAMOL 500 MG PO TABS: 500.0000 mg | ORAL_TABLET | Freq: Four times a day (QID) | ORAL | Status: DC | PRN

## 2019-08-24 MED ORDER — ENOXAPARIN (LOVENOX) PATIENT EDUCATION KIT
PACK | Freq: Once | Status: AC
  Filled 2019-08-24: qty 1

## 2019-08-24 MED ORDER — ENOXAPARIN SODIUM 40 MG/0.4ML ~~LOC~~ SOLN
40.0000 mg | SUBCUTANEOUS | Status: DC
  Administered 2019-08-24: 40 mg via SUBCUTANEOUS
  Filled 2019-08-24: qty 0.4

## 2019-08-24 NOTE — Progress Notes (Signed)
Progress Note    Larry Bautista  [id number] DOB: November 17, 1940  DOA: 08/21/2019 PCP: [name], MD    Brief Narrative:    Medical records reviewed and are as summarized below:  Larry Bautista is an 78 y.o. male with history of persistent atrial fibrillation on Coumadin, CAD/PCI, stents, hypertension, dyslipidemia, history of esophageal CA in remission, presented to the ED with left knee swelling and pain 12/13, he apparently twisted his left knee try to get into his car on 12/12, x-ray noted large knee effusion, no fractures.    Assessment/Plan:   Principal Problem:   Knee pain Active Problems:   Essential hypertension   CAD in native artery   AF (paroxysmal atrial fibrillation) (HCC)   COPD with chronic bronchitis (HCC)   Hematoma of left knee region   Large left knee hemarthrosis -Suspect traumatic hematoma, patient has a low-grade temp of 99.9 and leukocytosis of 19 K likely from same, orthopedics consult appreciated, recommended Ace wrap and knee immobilizer for comfort, weightbearing as tolerated -Anticipate he will require long period of recovery -Coumadin discontinued temporarily, see discussion below -Pain control, PT OT -social work for short-term rehab (will order COVID test)  Permanent atrial fibrillation -Rate controlled, continue metoprolol - Coumadin/anticoagulation held in the setting of large left knee hemarthrosis -Dr. Broadus John discussed this with the patient and extensively with patient's wife [name] that we would recommend holding Coumadin for 2-3 weeks, wife understands that his stroke risk will be higher while off anticoagulation but agrees with this plan, [name] tells me that he was off Coumadin for 3 to 4 months last year during evaluation for esophageal cancer  Encephalopathy -Patient was confused and disoriented on day #2 morning in the setting of pain and multiple doses of IV narcotics -mental status seems improved after IV  morphine stopped -Continue scheduled Tylenol and I will add robaxin for spasms  CAD -Stable, continue beta-blocker  COPD -Stable, no wheezing, nebs as needed  History of esophageal cancer -Status post chemo and XRT in 2019, reportedly in remission per wife    Family Communication/Anticipated D/C date and plan/Code Status   DVT prophylaxis: scds-- will use lovenox as VTE Prophylaxis for now Code Status: dnr Family Communication:  Disposition Plan: SNF in AM   Medical Consultants:    ortho  Subjective:   Having spasms  Objective:    Vitals:   08/23/19 1231 08/23/19 1847 08/24/19 0010 08/24/19 0557  BP: 111/78 120/80 132/83 (!) 146/88  Pulse: 80 79 75 79  Resp: 18 18 18 18   Temp: 98.1 F (36.7 C) 98 F (36.7 C) 99.3 F (37.4 C) 98.3 F (36.8 C)  TempSrc: Oral Oral Oral Oral  SpO2: 97% 98% 96% 95%  Weight:      Height:        Intake/Output Summary (Last 24 hours) at 08/24/2019 1004 Last data filed at 08/24/2019 [id number] Gross per 24 hour  Intake -  Output 1000 ml  Net -1000 ml   Filed Weights   08/21/19 1704  Weight: 79.4 kg    Exam: In bed, uncomfortable appearring rrr No increased work of breathing Alert to person, time, place +BS, soft Condom catheter in place  Data Reviewed:   I have personally reviewed following labs and imaging studies:  Labs: Labs show the following:   Basic Metabolic Panel: Recent Labs  Lab 08/21/19 2032 08/22/19 0538 08/23/19 0629 08/24/19 0525  NA 139 139 140 141  K 4.5 3.9 4.6 4.2  CL 103 104 105 104  CO2 25 25 28 28   GLUCOSE 139* 157* 109* 122*  BUN 10 12 11 12   CREATININE 0.80 0.73 0.78 0.88  CALCIUM 9.0 8.9 8.9 9.3   GFR Estimated Creatinine Clearance: 65.9 mL/min (by C-G formula based on SCr of 0.88 mg/dL). Liver Function Tests: Recent Labs  Lab 08/21/19 2032  AST 24  ALT 19  ALKPHOS 43  BILITOT 1.8*  PROT 6.1*  ALBUMIN 3.9   No results for input(s): LIPASE, AMYLASE in the last 168  hours. No results for input(s): AMMONIA in the last 168 hours. Coagulation profile Recent Labs  Lab 08/21/19 2032 08/22/19 0538 08/23/19 0629 08/24/19 0525  INR 2.7* 2.7* 1.5* 1.2    CBC: Recent Labs  Lab 08/21/19 2032 08/22/19 0538 08/23/19 0629 08/24/19 0525  WBC 18.4* 19.3* 16.2* 13.9*  NEUTROABS 15.0*  --   --   --   HGB 14.9 14.5 14.4 14.6  HCT 45.3 43.9 44.1 44.9  MCV 97.8 97.6 98.9 97.8  PLT 211 204 180 206   Cardiac Enzymes: No results for input(s): CKTOTAL, CKMB, CKMBINDEX, TROPONINI in the last 168 hours. BNP (last 3 results) No results for input(s): PROBNP in the last 8760 hours. CBG: No results for input(s): GLUCAP in the last 168 hours. D-Dimer: No results for input(s): DDIMER in the last 72 hours. Hgb A1c: No results for input(s): HGBA1C in the last 72 hours. Lipid Profile: No results for input(s): CHOL, HDL, LDLCALC, TRIG, CHOLHDL, LDLDIRECT in the last 72 hours. Thyroid function studies: No results for input(s): TSH, T4TOTAL, T3FREE, THYROIDAB in the last 72 hours.  Invalid input(s): FREET3 Anemia work up: No results for input(s): VITAMINB12, FOLATE, FERRITIN, TIBC, IRON, RETICCTPCT in the last 72 hours. Sepsis Labs: Recent Labs  Lab 08/21/19 2032 08/22/19 0538 08/23/19 0629 08/24/19 0525  WBC 18.4* 19.3* 16.2* 13.9*    Microbiology Recent Results (from the past 240 hour(s))  SARS CORONAVIRUS 2 (TAT 6-24 HRS) Nasopharyngeal Nasopharyngeal Swab     Status: None   Collection Time: 08/21/19  8:32 PM   Specimen: Nasopharyngeal Swab  Result Value Ref Range Status   SARS Coronavirus 2 NEGATIVE NEGATIVE Final    Comment: (NOTE) SARS-CoV-2 target nucleic acids are NOT DETECTED. The SARS-CoV-2 RNA is generally detectable in upper and lower respiratory specimens during the acute phase of infection. Negative results do not preclude SARS-CoV-2 infection, do not rule out co-infections with other pathogens, and should not be used as the sole basis  for treatment or other patient management decisions. Negative results must be combined with clinical observations, patient history, and epidemiological information. The expected result is Negative. Fact Sheet for Patients: SugarRoll.be Fact Sheet for Healthcare Providers: https://www.woods-mathews.com/ This test is not yet approved or cleared by the [place] FDA and  has been authorized for detection and/or diagnosis of SARS-CoV-2 by FDA under an Emergency Use Authorization (EUA). This EUA will remain  in effect (meaning this test can be used) for the duration of the COVID-19 declaration under Section 56 4(b)(1) of the Act, 21 U.S.C. section 360bbb-3(b)(1), unless the authorization is terminated or revoked sooner. Performed at [facility], [place] [address] [place], [place] [address]     Procedures and diagnostic studies:  No results found.  Medications:   . acetaminophen  650 mg Oral QID  . fluticasone  2 spray Each Nare Daily  . gabapentin  300 mg Oral Daily  . metoprolol succinate  25 mg Oral Daily  .  pantoprazole  40 mg Oral Daily  . rosuvastatin  20 mg Oral q1800  . tamsulosin  0.4 mg Oral QPC supper  . vitamin C  500 mg Oral Daily   Continuous Infusions:   LOS: 2 days   Geradine Girt  [facility]   How to contact the [facility] Attending or Consulting provider [place] or covering provider during after hours [place], for this patient?  1. Check the care team in [facility] and look for a) attending/consulting TRH provider listed and b) the [facility] team listed 2. Log into [email or web address] and use [place]'s universal password to access. If you do not have the password, please contact the hospital operator. 3. Locate the [facility] provider you are looking for under [facility] and page to a number that you can be directly reached. 4. If you still have difficulty reaching the provider, please page the [facility] (Director on Call) for  the Hospitalists listed on amion for assistance.  08/24/2019, 10:04 AM

## 2019-08-24 NOTE — Progress Notes (Addendum)
ANTICOAGULATION CONSULT NOTE - Initial Consult  Pharmacy Consult for enoxaparin Indication: atrial fibrillation in setting of holding warfarin  Allergies  Allergen Reactions  . Celecoxib Rash  . Lisinopril Cough  . Simvastatin Other (See Comments)    Leg pain    Patient Measurements: Height: 5\' 8"  (172.7 cm) Weight: 175 lb (79.4 kg) IBW/kg (Calculated) : 68.4 Heparin Dosing Weight: 79.4  Vital Signs: Temp: 98.3 F (36.8 C) (12/16 0557) Temp Source: Oral (12/16 0557) BP: 146/88 (12/16 0557) Pulse Rate: 79 (12/16 0557)  Labs: Recent Labs    08/22/19 0538 08/23/19 0629 08/24/19 0525  HGB 14.5 14.4 14.6  HCT 43.9 44.1 44.9  PLT 204 180 206  LABPROT 28.9* 17.5* 15.4*  INR 2.7* 1.5* 1.2  CREATININE 0.73 0.78 0.88    Estimated Creatinine Clearance: 65.9 mL/min (by C-G formula based on SCr of 0.88 mg/dL).   Medical History: Past Medical History:  Diagnosis Date  . Allergy   . CHF (congestive heart failure) ([place])   . Chronic knee pain   . Coronary artery disease    with LAD DE stent 2004  . Heel spur, left   . Hyperlipidemia   . Hypertension   . Kidney stones   . Lower back pain    status post surgery for spondylolisthesis  . Migraine   . Persistent atrial fibrillation ([place])    longstanding persistent (since 05/2008)  . Plantar fasciitis    Assessment: 78 yo male with persistent atrial fibrillation on warfarin PTA. Patient with traumatic hemarthrosis for which warfarin is being held. Plan is to hold warfarin for  2-3 weeks and start enoxaparin therapy as a bridge per MD. Pharmacy has been asked to dose enoxaparin for prophylaxis.   Patient's CrCl ~66, Hgb 14.6, and INR 1.2 today  Goal of Therapy:  Prevention of stroke and VTE Monitor platelets by anticoagulation protocol: Yes   Plan:  - Enoxaparin 40mg  q24h -Would suggest CM consult to determine insurance coverage for enoxaparin prior to discharge -Will monitor for bleeding and for renal function while  on enoxaparin inpatient.   Jowan Skillin A Dayona Shaheen 08/24/2019,10:22 AM

## 2019-08-24 NOTE — Progress Notes (Signed)
RN went over lovenox at home kit with the patient and how to, give himself the medication pt AOx4 and states understanding atm.

## 2019-08-24 NOTE — TOC Progression Note (Addendum)
Transition of Care [facility]) - Progression Note    Patient Details  Name: Larry Bautista MRN: [id number] Date of Birth: [birth year]  Transition of Care [facility]) CM/SW [place], [place] Phone Number: 08/24/2019, 10:23 AM  Clinical Narrative:    12:15pm- CSW called and spoke with pt wife, provided offers; pt wife interested in 48 and 5 star facilities- verbally gave her the three with those ratings ([place], [place]). She is interested in pt discharge to [facility]. Updated Elyse Hsu in admissions who will call wife to complete paperwork and answer questions regarding room and bringing items.   CSW awaits COVID swab result, will complete PTAR paperwork.   10:27am- CSW spoke with [facility], she does not think they will have any rooms tomorrow, states she will call this writer to update on bed availability this afternoon.   10:23am- CSW has called and left f/u voice message for [facility], [facility] and [facility]).   [facility] has declined- no beds available today or tomorrow.    Expected Discharge Plan: [place] Barriers to Discharge: Continued Medical Work up  Expected Discharge Plan and Services Expected Discharge Plan: [place]   Discharge Planning Services: CM Consult Post Acute Care Choice: [place] Living arrangements for the past 2 months: Single Family Home           DME Arranged: N/A HH Arranged: NA   Readmission Risk Interventions No flowsheet data found.

## 2019-08-25 DIAGNOSIS — N400 Enlarged prostate without lower urinary tract symptoms: Secondary | ICD-10-CM | POA: Diagnosis not present

## 2019-08-25 DIAGNOSIS — I2510 Atherosclerotic heart disease of native coronary artery without angina pectoris: Secondary | ICD-10-CM | POA: Diagnosis not present

## 2019-08-25 DIAGNOSIS — Z955 Presence of coronary angioplasty implant and graft: Secondary | ICD-10-CM | POA: Diagnosis not present

## 2019-08-25 DIAGNOSIS — Z03818 Encounter for observation for suspected exposure to other biological agents ruled out: Secondary | ICD-10-CM | POA: Diagnosis not present

## 2019-08-25 DIAGNOSIS — S8002XD Contusion of left knee, subsequent encounter: Secondary | ICD-10-CM | POA: Diagnosis not present

## 2019-08-25 DIAGNOSIS — Z87891 Personal history of nicotine dependence: Secondary | ICD-10-CM | POA: Diagnosis not present

## 2019-08-25 DIAGNOSIS — R2689 Other abnormalities of gait and mobility: Secondary | ICD-10-CM | POA: Diagnosis not present

## 2019-08-25 DIAGNOSIS — S8991XA Unspecified injury of right lower leg, initial encounter: Secondary | ICD-10-CM

## 2019-08-25 DIAGNOSIS — U071 COVID-19: Secondary | ICD-10-CM | POA: Diagnosis not present

## 2019-08-25 DIAGNOSIS — R41841 Cognitive communication deficit: Secondary | ICD-10-CM | POA: Diagnosis not present

## 2019-08-25 DIAGNOSIS — R791 Abnormal coagulation profile: Secondary | ICD-10-CM | POA: Diagnosis not present

## 2019-08-25 DIAGNOSIS — M19011 Primary osteoarthritis, right shoulder: Secondary | ICD-10-CM | POA: Diagnosis not present

## 2019-08-25 DIAGNOSIS — Z9221 Personal history of antineoplastic chemotherapy: Secondary | ICD-10-CM | POA: Diagnosis not present

## 2019-08-25 DIAGNOSIS — R531 Weakness: Secondary | ICD-10-CM | POA: Diagnosis not present

## 2019-08-25 DIAGNOSIS — E785 Hyperlipidemia, unspecified: Secondary | ICD-10-CM | POA: Diagnosis not present

## 2019-08-25 DIAGNOSIS — M25062 Hemarthrosis, left knee: Secondary | ICD-10-CM | POA: Diagnosis not present

## 2019-08-25 DIAGNOSIS — M6281 Muscle weakness (generalized): Secondary | ICD-10-CM | POA: Diagnosis not present

## 2019-08-25 DIAGNOSIS — M25562 Pain in left knee: Secondary | ICD-10-CM | POA: Diagnosis not present

## 2019-08-25 DIAGNOSIS — R262 Difficulty in walking, not elsewhere classified: Secondary | ICD-10-CM | POA: Diagnosis not present

## 2019-08-25 DIAGNOSIS — J309 Allergic rhinitis, unspecified: Secondary | ICD-10-CM | POA: Diagnosis not present

## 2019-08-25 DIAGNOSIS — Z7901 Long term (current) use of anticoagulants: Secondary | ICD-10-CM | POA: Diagnosis not present

## 2019-08-25 DIAGNOSIS — S8002XA Contusion of left knee, initial encounter: Secondary | ICD-10-CM | POA: Diagnosis not present

## 2019-08-25 DIAGNOSIS — J449 Chronic obstructive pulmonary disease, unspecified: Secondary | ICD-10-CM | POA: Diagnosis not present

## 2019-08-25 DIAGNOSIS — I480 Paroxysmal atrial fibrillation: Secondary | ICD-10-CM | POA: Diagnosis not present

## 2019-08-25 DIAGNOSIS — I4891 Unspecified atrial fibrillation: Secondary | ICD-10-CM | POA: Diagnosis not present

## 2019-08-25 DIAGNOSIS — Z8501 Personal history of malignant neoplasm of esophagus: Secondary | ICD-10-CM | POA: Diagnosis not present

## 2019-08-25 DIAGNOSIS — M25511 Pain in right shoulder: Secondary | ICD-10-CM | POA: Diagnosis not present

## 2019-08-25 DIAGNOSIS — I10 Essential (primary) hypertension: Secondary | ICD-10-CM | POA: Diagnosis not present

## 2019-08-25 DIAGNOSIS — G9340 Encephalopathy, unspecified: Secondary | ICD-10-CM | POA: Diagnosis not present

## 2019-08-25 LAB — CBC
HCT: 43.3 % (ref 39.0–52.0)
Hemoglobin: 14.3 g/dL (ref 13.0–17.0)
MCH: 32.3 pg (ref 26.0–34.0)
MCHC: 33 g/dL (ref 30.0–36.0)
MCV: 97.7 fL (ref 80.0–100.0)
Platelets: 231 10*3/uL (ref 150–400)
RBC: 4.43 MIL/uL (ref 4.22–5.81)
RDW: 12.4 % (ref 11.5–15.5)
WBC: 10.8 10*3/uL — ABNORMAL HIGH (ref 4.0–10.5)
nRBC: 0 % (ref 0.0–0.2)

## 2019-08-25 LAB — BASIC METABOLIC PANEL
Anion gap: 10 (ref 5–15)
BUN: 15 mg/dL (ref 8–23)
CO2: 27 mmol/L (ref 22–32)
Calcium: 8.9 mg/dL (ref 8.9–10.3)
Chloride: 103 mmol/L (ref 98–111)
Creatinine, Ser: 0.72 mg/dL (ref 0.61–1.24)
GFR calc Af Amer: >60 mL/min (ref >60)
GFR calc non Af Amer: >60 mL/min (ref >60)
Glucose, Bld: 118 mg/dL — ABNORMAL HIGH (ref 70–99)
Potassium: 3.7 mmol/L (ref 3.5–5.1)
Sodium: 140 mmol/L (ref 135–145)

## 2019-08-25 MED ORDER — HYDROCODONE-ACETAMINOPHEN 5-325 MG PO TABS: 1.0000 | ORAL_TABLET | Freq: Four times a day (QID) | ORAL | 0 refills | Status: DC | PRN

## 2019-08-25 MED ORDER — ENOXAPARIN SODIUM 40 MG/0.4ML ~~LOC~~ SOLN: 40.0000 mg | SUBCUTANEOUS | Status: DC

## 2019-08-25 MED ORDER — ACETAMINOPHEN 325 MG PO TABS: 650.0000 mg | ORAL_TABLET | Freq: Four times a day (QID) | ORAL | Status: DC

## 2019-08-25 MED ORDER — METHOCARBAMOL 500 MG PO TABS: 500.0000 mg | ORAL_TABLET | Freq: Four times a day (QID) | ORAL | Status: DC | PRN

## 2019-08-25 NOTE — Social Work (Signed)
Clinical Social Worker facilitated patient discharge including contacting patient family and facility to confirm patient discharge plans.  Clinical information faxed to facility and family agreeable with plan.  CSW arranged ambulance transport via [facility] to [facility]. [facility] scheduled for 2pm.  RN to call [phone]  with report prior to discharge.  Clinical Social Worker will sign off for now as social work intervention is no longer needed. Please consult [place] again if new need arises.  Westley Hummer, MSW, [place] Social Worker [phone]

## 2019-08-25 NOTE — Discharge Summary (Signed)
Physician Discharge Summary  [name] [id number] DOB: [birth year] DOA: 08/21/2019  PCP: [name], MD  Admit date: 08/21/2019 Discharge date: 08/25/2019  Admitted From: home Discharge disposition: SNF   Recommendations for Outpatient Follow-Up:   1. lovenox SQ as DVT prophylaxis until more mobile  2. Resume coumadin in 2 weeks- will need follow up with Dr. Doran Durand 3. Recommend ACE wrap, KI for comfort, elevation,  and WBAT 4. Scheduled tylenol for 1 week then change to PRN   Discharge Diagnosis:   Principal Problem:   Knee pain Active Problems:   Essential hypertension   CAD in native artery   AF (paroxysmal atrial fibrillation) (HCC)   COPD with chronic bronchitis (HCC)   Hematoma of left knee region    Discharge Condition: Improved.  Diet recommendation: Low sodium, heart healthy  Wound care: None.  Code status: DNR.   History of Present Illness:  Larry Bautista is a 78 y.o. male with history of persistent atrial fibrillation, CAD status post stent, hyperlipidemia, hypertension presents to the ER after patient hurt his leg when he was trying to get into the car.  Patient states he twisted his left knee when he was trying to get into the car almost 24 hours ago.  The pain and swelling has gradually worsened and decided to come to the ER at the point when patient was unable to walk on it.  Denies any fever chills shortness of breath chest pain.   Hospital Course by Problem:   Large left knee hemarthrosis -Suspect traumatic hematoma, patient has a low-grade temp of 99.9 and leukocytosis of 19 K likely from same, orthopedics consultappreciated, recommended Ace wrap and knee immobilizer for comfort, weightbearing as tolerated -Anticipate he will require long period of recovery -Coumadin discontinued temporarily, see discussion below  Permanent atrial fibrillation -Rate controlled, continue metoprolol - Coumadin/anticoagulation held in  the setting of large left knee hemarthrosis -Dr. Broadus John discussed this with the patient and extensively with patient's wife Larry Bautista that we would recommend holding Coumadin for 2-3weeks,wife understands that his stroke risk will be higher while off anticoagulation but agrees with this plan,Rosa tells me that he was off Coumadin for 3 to 4 months last year during evaluation for esophageal cancer  Encephalopathy -Patientwas confused and disoriented on day #2 morning in the setting of pain and multiple doses of IV narcotics -mental status seems improved after IV morphine stopped and on day of discharge back to baseline -Continue scheduled Tylenol and add PRN robaxin for spasms  CAD -Stable, continue beta-blocker  COPD -Stable, no wheezing, nebs as needed  History of esophageal cancer -Status post chemo and XRT in 2019, reportedly in remission per wife    Medical Consultants:    ortho  Discharge Exam:   Vitals:   08/25/19 0041 08/25/19 0432  BP: (!) 142/93 (!) 148/87  Pulse: 78 76  Resp: 18 18  Temp: 97.9 F (36.6 C) 98.2 F (36.8 C)  SpO2: 97% 98%   Vitals:   08/24/19 1216 08/24/19 1819 08/25/19 0041 08/25/19 0432  BP: 136/67 105/77 (!) 142/93 (!) 148/87  Pulse: 74 60 78 76  Resp: 16 16 18 18   Temp: 98.1 F (36.7 C) 98.4 F (36.9 C) 97.9 F (36.6 C) 98.2 F (36.8 C)  TempSrc: Oral Oral Oral Oral  SpO2: 99% 96% 97% 98%  Weight:      Height:        General exam: Appears calm and comfortable.  The results of significant diagnostics from this hospitalization (including imaging, microbiology, ancillary and laboratory) are listed below for reference.     Procedures and Diagnostic Studies:   DG Knee Complete 4 Views Left  Result Date: 08/21/2019 CLINICAL DATA:  Left knee pain, twisting injury EXAM: LEFT KNEE - COMPLETE 4+ VIEW COMPARISON:  None. FINDINGS: No fracture or dislocation of the left knee. There is mild tricompartmental joint space narrowing  and osteophytosis. Chondrocalcinosis. There is a large, nonspecific knee joint effusion. Soft tissue edema about the anterior and medial knee. IMPRESSION: No fracture or dislocation of the left knee. Large, nonspecific knee joint effusion and soft tissue edema about the anterior and medial knee. Electronically Signed   By: Eddie Candle M.D.   On: 08/21/2019 16:39     Labs:   Basic Metabolic Panel: Recent Labs  Lab 08/21/19 2032 08/22/19 0538 08/23/19 0629 08/24/19 0525 08/25/19 0102  NA 139 139 140 141 140  K 4.5 3.9 4.6 4.2 3.7  CL 103 104 105 104 103  CO2 25 25 28 28 27   GLUCOSE 139* 157* 109* 122* 118*  BUN 10 12 11 12 15   CREATININE 0.80 0.73 0.78 0.88 0.72  CALCIUM 9.0 8.9 8.9 9.3 8.9   GFR Estimated Creatinine Clearance: 72.4 mL/min (by C-G formula based on SCr of 0.72 mg/dL). Liver Function Tests: Recent Labs  Lab 08/21/19 2032  AST 24  ALT 19  ALKPHOS 43  BILITOT 1.8*  PROT 6.1*  ALBUMIN 3.9   No results for input(s): LIPASE, AMYLASE in the last 168 hours. No results for input(s): AMMONIA in the last 168 hours. Coagulation profile Recent Labs  Lab 08/21/19 2032 08/22/19 0538 08/23/19 0629 08/24/19 0525  INR 2.7* 2.7* 1.5* 1.2    CBC: Recent Labs  Lab 08/21/19 2032 08/22/19 0538 08/23/19 0629 08/24/19 0525 08/25/19 0102  WBC 18.4* 19.3* 16.2* 13.9* 10.8*  NEUTROABS 15.0*  --   --   --   --   HGB 14.9 14.5 14.4 14.6 14.3  HCT 45.3 43.9 44.1 44.9 43.3  MCV 97.8 97.6 98.9 97.8 97.7  PLT 211 204 180 206 231   Cardiac Enzymes: No results for input(s): CKTOTAL, CKMB, CKMBINDEX, TROPONINI in the last 168 hours. BNP: Invalid input(s): POCBNP CBG: No results for input(s): GLUCAP in the last 168 hours. D-Dimer No results for input(s): DDIMER in the last 72 hours. Hgb A1c No results for input(s): HGBA1C in the last 72 hours. Lipid Profile No results for input(s): CHOL, HDL, LDLCALC, TRIG, CHOLHDL, LDLDIRECT in the last 72 hours. Thyroid function  studies No results for input(s): TSH, T4TOTAL, T3FREE, THYROIDAB in the last 72 hours.  Invalid input(s): FREET3 Anemia work up No results for input(s): VITAMINB12, FOLATE, FERRITIN, TIBC, IRON, RETICCTPCT in the last 72 hours. Microbiology Recent Results (from the past 240 hour(s))  SARS CORONAVIRUS 2 (TAT 6-24 HRS) Nasopharyngeal Nasopharyngeal Swab     Status: None   Collection Time: 08/21/19  8:32 PM   Specimen: Nasopharyngeal Swab  Result Value Ref Range Status   SARS Coronavirus 2 NEGATIVE NEGATIVE Final    Comment: (NOTE) SARS-CoV-2 target nucleic acids are NOT DETECTED. The SARS-CoV-2 RNA is generally detectable in upper and lower respiratory specimens during the acute phase of infection. Negative results do not preclude SARS-CoV-2 infection, do not rule out co-infections with other pathogens, and should not be used as the sole basis for treatment or other patient management decisions. Negative results must be combined with clinical observations, patient history,  and epidemiological information. The expected result is Negative. Fact Sheet for Patients: SugarRoll.be Fact Sheet for Healthcare Providers: https://www.woods-mathews.com/ This test is not yet approved or cleared by the [place] FDA and  has been authorized for detection and/or diagnosis of SARS-CoV-2 by FDA under an Emergency Use Authorization (EUA). This EUA will remain  in effect (meaning this test can be used) for the duration of the COVID-19 declaration under Section 56 4(b)(1) of the Act, 21 U.S.C. section 360bbb-3(b)(1), unless the authorization is terminated or revoked sooner. Performed at [facility], [place] [address], [place], [place] [address]   SARS CORONAVIRUS 2 (TAT 6-24 HRS) Nasopharyngeal Nasopharyngeal Swab     Status: None   Collection Time: 08/24/19  5:35 PM   Specimen: Nasopharyngeal Swab  Result Value Ref Range Status   SARS Coronavirus 2  NEGATIVE NEGATIVE Final    Comment: (NOTE) SARS-CoV-2 target nucleic acids are NOT DETECTED. The SARS-CoV-2 RNA is generally detectable in upper and lower respiratory specimens during the acute phase of infection. Negative results do not preclude SARS-CoV-2 infection, do not rule out co-infections with other pathogens, and should not be used as the sole basis for treatment or other patient management decisions. Negative results must be combined with clinical observations, patient history, and epidemiological information. The expected result is Negative. Fact Sheet for Patients: SugarRoll.be Fact Sheet for Healthcare Providers: https://www.woods-mathews.com/ This test is not yet approved or cleared by the [place] FDA and  has been authorized for detection and/or diagnosis of SARS-CoV-2 by FDA under an Emergency Use Authorization (EUA). This EUA will remain  in effect (meaning this test can be used) for the duration of the COVID-19 declaration under Section 56 4(b)(1) of the Act, 21 U.S.C. section 360bbb-3(b)(1), unless the authorization is terminated or revoked sooner. Performed at [facility], [place] [address], [place],  [address]      Discharge Instructions:   Discharge Instructions    Diet - low sodium heart healthy   Complete by: As directed    Increase activity slowly   Complete by: As directed      Allergies as of 08/25/2019      Reactions   Celecoxib Rash   Lisinopril Cough   Simvastatin Other (See Comments)   Leg pain      Medication List    STOP taking these medications   diphenhydramine-acetaminophen 25-500 MG Tabs tablet Commonly known as: TYLENOL PM   VITAMIN C PO   warfarin 2.5 MG tablet Commonly known as: COUMADIN     TAKE these medications   acetaminophen 325 MG tablet Commonly known as: TYLENOL Take 2 tablets (650 mg total) by mouth QID.   albuterol 108 (90 Base) MCG/ACT  inhaler Commonly known as: ProAir HFA Inhale 1-2 puffs into the lungs every 6 (six) hours as needed for wheezing or shortness of breath.   cetirizine 10 MG tablet Commonly known as: ZYRTEC Take 10 mg by mouth daily.   enoxaparin 40 MG/0.4ML injection Commonly known as: LOVENOX Inject 0.4 mLs (40 mg total) into the skin daily for 6 days.   eplerenone 25 MG tablet Commonly known as: INSPRA TAKE ONE (1) TABLET EACH DAY What changed: See the new instructions.   fluticasone 50 MCG/ACT nasal spray Commonly known as: FLONASE USE 2 SPRAYS IN EACH NOSTRIL DAILY   gabapentin 300 MG capsule Commonly known as: NEURONTIN Take 300 mg by mouth daily.   HYDROcodone-acetaminophen 5-325 MG tablet Commonly known as: NORCO/VICODIN Take 1 tablet  by mouth every 6 (six) hours as needed for moderate pain or severe pain.   methocarbamol 500 MG tablet Commonly known as: ROBAXIN Take 1 tablet (500 mg total) by mouth every 6 (six) hours as needed for muscle spasms.   metoprolol succinate 25 MG 24 hr tablet Commonly known as: TOPROL-XL TAKE ONE (1) TABLET EACH DAY What changed: See the new instructions.   nitroGLYCERIN 0.4 MG SL tablet Commonly known as: NITROSTAT Place 1 tablet (0.4 mg total) under the tongue every 5 (five) minutes as needed.   pantoprazole 40 MG tablet Commonly known as: PROTONIX TAKE ONE (1) TABLET EACH DAY What changed: See the new instructions.   psyllium 58.6 % powder Commonly known as: METAMUCIL Take 1 packet by mouth daily.   rosuvastatin 20 MG tablet Commonly known as: CRESTOR Take 1 tablet (20 mg total) by mouth daily.   tamsulosin 0.4 MG Caps capsule Commonly known as: FLOMAX Take 0.4 mg by mouth daily after supper.   vitamin C 500 MG tablet Commonly known as: ASCORBIC ACID Take 500 mg by mouth daily.      Contact information for after-discharge care    Destination    [facility] .   Service: Skilled  Nursing Contact information: [address] [place] [address] [place] [phone]               Time coordinating discharge: 35 min  Signed:  Geradine Girt DO  [facility] 08/25/2019, 8:51 AM

## 2019-08-25 NOTE — Care Management Important Message (Signed)
Important Message  Patient Details  Name: Larry Bautista MRN: [id number] Date of Birth: [birth year]   Medicare Important Message Given:  Yes     Orbie Pyo 08/25/2019, 2:13 PM

## 2019-08-25 NOTE — Progress Notes (Signed)
Patient d/c to SNF, transported by ptar. Discharge instructions and paper work sent by [facility]. Report called to SNF by Janetta Hora RN  Felissa Blouch, Tivis Ringer, RN

## 2019-09-06 ENCOUNTER — Other Ambulatory Visit: Payer: Self-pay | Admitting: *Deleted

## 2019-09-06 NOTE — Patient Outreach (Signed)
Member screened for potential [facility] Care Management needs as a benefit of [place] Medicare.  Mr. Burczyk is currently receiving skilled therapy at [facility].  Per chart review, member lived with wife prior.   Noted Mr. Ishmael' Primary Care Provider has a [place] Management team. Writer will keep the [facility] team updated.   Will continue to follow while Mr. Laible resides at [facility].   Marthenia Rolling, MSN-Ed, RN,BSN [place] Acute Care Coordinator [phone] [facility]) [phone]  (Toll free office)

## 2019-09-07 DIAGNOSIS — I2510 Atherosclerotic heart disease of native coronary artery without angina pectoris: Secondary | ICD-10-CM | POA: Diagnosis not present

## 2019-09-07 DIAGNOSIS — M25062 Hemarthrosis, left knee: Secondary | ICD-10-CM | POA: Diagnosis not present

## 2019-09-07 DIAGNOSIS — I4891 Unspecified atrial fibrillation: Secondary | ICD-10-CM | POA: Diagnosis not present

## 2019-09-07 DIAGNOSIS — G9340 Encephalopathy, unspecified: Secondary | ICD-10-CM | POA: Diagnosis not present

## 2019-09-08 DIAGNOSIS — M25062 Hemarthrosis, left knee: Secondary | ICD-10-CM | POA: Diagnosis not present

## 2019-09-08 DIAGNOSIS — I2510 Atherosclerotic heart disease of native coronary artery without angina pectoris: Secondary | ICD-10-CM | POA: Diagnosis not present

## 2019-09-08 DIAGNOSIS — I10 Essential (primary) hypertension: Secondary | ICD-10-CM | POA: Diagnosis not present

## 2019-09-08 DIAGNOSIS — E785 Hyperlipidemia, unspecified: Secondary | ICD-10-CM | POA: Diagnosis not present

## 2019-09-10 DIAGNOSIS — R791 Abnormal coagulation profile: Secondary | ICD-10-CM | POA: Diagnosis not present

## 2019-09-10 DIAGNOSIS — I4891 Unspecified atrial fibrillation: Secondary | ICD-10-CM | POA: Diagnosis not present

## 2019-09-14 DIAGNOSIS — I2510 Atherosclerotic heart disease of native coronary artery without angina pectoris: Secondary | ICD-10-CM | POA: Diagnosis not present

## 2019-09-14 DIAGNOSIS — M25062 Hemarthrosis, left knee: Secondary | ICD-10-CM | POA: Diagnosis not present

## 2019-09-14 DIAGNOSIS — I4891 Unspecified atrial fibrillation: Secondary | ICD-10-CM | POA: Diagnosis not present

## 2019-09-14 DIAGNOSIS — G9340 Encephalopathy, unspecified: Secondary | ICD-10-CM | POA: Diagnosis not present

## 2019-09-15 DIAGNOSIS — M25062 Hemarthrosis, left knee: Secondary | ICD-10-CM | POA: Diagnosis not present

## 2019-09-15 DIAGNOSIS — I4891 Unspecified atrial fibrillation: Secondary | ICD-10-CM | POA: Diagnosis not present

## 2019-09-15 DIAGNOSIS — G9340 Encephalopathy, unspecified: Secondary | ICD-10-CM | POA: Diagnosis not present

## 2019-09-15 DIAGNOSIS — U071 COVID-19: Secondary | ICD-10-CM | POA: Diagnosis not present

## 2019-09-19 DIAGNOSIS — I4891 Unspecified atrial fibrillation: Secondary | ICD-10-CM | POA: Diagnosis not present

## 2019-09-20 DIAGNOSIS — M25062 Hemarthrosis, left knee: Secondary | ICD-10-CM | POA: Diagnosis not present

## 2019-09-20 DIAGNOSIS — I4891 Unspecified atrial fibrillation: Secondary | ICD-10-CM | POA: Diagnosis not present

## 2019-09-20 DIAGNOSIS — U071 COVID-19: Secondary | ICD-10-CM | POA: Diagnosis not present

## 2019-09-20 DIAGNOSIS — R791 Abnormal coagulation profile: Secondary | ICD-10-CM | POA: Diagnosis not present

## 2019-09-22 DIAGNOSIS — M25062 Hemarthrosis, left knee: Secondary | ICD-10-CM | POA: Diagnosis not present

## 2019-09-22 DIAGNOSIS — R791 Abnormal coagulation profile: Secondary | ICD-10-CM | POA: Diagnosis not present

## 2019-09-22 DIAGNOSIS — U071 COVID-19: Secondary | ICD-10-CM | POA: Diagnosis not present

## 2019-09-22 DIAGNOSIS — I4891 Unspecified atrial fibrillation: Secondary | ICD-10-CM | POA: Diagnosis not present

## 2019-09-26 DIAGNOSIS — Z7901 Long term (current) use of anticoagulants: Secondary | ICD-10-CM | POA: Diagnosis not present

## 2019-09-28 ENCOUNTER — Other Ambulatory Visit: Payer: Self-pay | Admitting: *Deleted

## 2019-09-28 DIAGNOSIS — U071 COVID-19: Secondary | ICD-10-CM | POA: Diagnosis not present

## 2019-09-28 DIAGNOSIS — R531 Weakness: Secondary | ICD-10-CM | POA: Diagnosis not present

## 2019-09-28 DIAGNOSIS — M25062 Hemarthrosis, left knee: Secondary | ICD-10-CM | POA: Diagnosis not present

## 2019-09-28 DIAGNOSIS — R262 Difficulty in walking, not elsewhere classified: Secondary | ICD-10-CM | POA: Diagnosis not present

## 2019-09-28 NOTE — Patient Outreach (Signed)
Screened for potential [facility] Care Management needs as a benefit of  [employer].  Larry Bautista is currently receiving skilled therapy at [facility].   Telephone call made to Mya Caserta (wife) at [phone]. Patient identifiers confirmed.  [name] confirms that member will come home on tomorrow 09/29/19. States he will have [place]. Mrs. Altamira states member recently had COVID and was not working with therapy due to being quarantined. She is concerned that he is deconditioned but states [name] is ready to come home. Mrs. Meuse states she can provide assistance and will be with him 24/7 post SNF.   Discussed that [name] PCP office  has a [place] Management team. Made wife aware that writer will update the team. [name] states she is hopeful that they will be able to have a virtual appointment since they live over 1 hr away.   [name] expressed appreciation of the call.   Update sent to [place] with [facility].   Marthenia Rolling, MSN-Ed, RN,BSN [place] Acute Care Coordinator [phone] [facility]) [phone]  (Toll free office)

## 2019-09-30 ENCOUNTER — Telehealth: Payer: Self-pay | Admitting: Interventional Cardiology

## 2019-09-30 ENCOUNTER — Ambulatory Visit: Payer: Self-pay

## 2019-09-30 DIAGNOSIS — U071 COVID-19: Secondary | ICD-10-CM | POA: Diagnosis not present

## 2019-09-30 NOTE — Chronic Care Management (AMB) (Signed)
  Chronic Care Management   Note  09/30/2019 Name: Larry Bautista MRN: [id number] DOB: [birth year]   Attempted initial outreach with [name]. He was receiving skilled therapy at [facility] with an anticipated discharge date of 09/29/19.  A HIPAA compliant voice message was left today requesting a return call.   PLAN -Will attempt contact again next week if call is not returned today.    Horris Latino [facility] Care Management [phone]

## 2019-09-30 NOTE — Telephone Encounter (Signed)
Magda Paganini from [facility] calling requesting Dr. Tamala Julian sign physical therapy orders for the patient. She states the patient does not see a PCP.

## 2019-09-30 NOTE — Telephone Encounter (Signed)
I spoke to [place] from [facility] [phone] who would like Dr Tamala Julian to sign PT orders for the patient.  I informed her that Dr Tamala Julian returns to office on 1/25.  She is fine with that. Please advise, thank you

## 2019-10-03 NOTE — Telephone Encounter (Signed)
Spoke with Magda Paganini and let her know that Dr. Tamala Julian would not be the one to sign the orders since PT is for a non cardiac reason.  Magda Paganini thanked me for the call.

## 2019-10-04 ENCOUNTER — Ambulatory Visit (INDEPENDENT_AMBULATORY_CARE_PROVIDER_SITE_OTHER): Payer: Medicare Other | Admitting: *Deleted

## 2019-10-04 ENCOUNTER — Other Ambulatory Visit: Payer: Self-pay

## 2019-10-04 ENCOUNTER — Telehealth: Payer: Self-pay

## 2019-10-04 DIAGNOSIS — Z5181 Encounter for therapeutic drug level monitoring: Secondary | ICD-10-CM | POA: Diagnosis not present

## 2019-10-04 DIAGNOSIS — I4891 Unspecified atrial fibrillation: Secondary | ICD-10-CM

## 2019-10-04 LAB — POCT INR: INR: 2.8 (ref 2.0–3.0)

## 2019-10-04 NOTE — Telephone Encounter (Signed)
Copied from [place] [id number]. Topic: General - Other >> Oct 04, 2019  1:19 PM Rainey Pines A wrote: Magda Paganini called to see if Dr. Caryn Section  would allow patient to do a virtual visit to count for as a visit to okay patient continuing home health PT order for patient hurt knee. Best contact [phone]

## 2019-10-04 NOTE — Telephone Encounter (Signed)
LMTCB

## 2019-10-04 NOTE — Telephone Encounter (Signed)
Please advise 

## 2019-10-04 NOTE — Telephone Encounter (Signed)
This patient has not been seen in nearly 2 years. Medicare requires a face-to-face visit and exam to document medical necessity within 60 days of order.

## 2019-10-04 NOTE — Patient Instructions (Addendum)
Description   Continue taking 2.5mg  daily except 1.25mg  on Sundays, Tuesdays and Thursdays. Recheck INR in 1 week. Coumadin clinic # [phone]. Main # [phone].

## 2019-10-04 NOTE — Telephone Encounter (Signed)
Copied from [place] [phone]. Topic: General - Inquiry >> Oct 04, 2019  9:48 AM Mathis Bud wrote: Reason for CRM: Patient wife called requesting PT home health visit.  Patient states he already contacted advanced home health.  Patient needs a PA.   Call back [phone]

## 2019-10-05 DIAGNOSIS — M19011 Primary osteoarthritis, right shoulder: Secondary | ICD-10-CM | POA: Diagnosis not present

## 2019-10-05 DIAGNOSIS — M25462 Effusion, left knee: Secondary | ICD-10-CM | POA: Diagnosis not present

## 2019-10-05 DIAGNOSIS — S83242A Other tear of medial meniscus, current injury, left knee, initial encounter: Secondary | ICD-10-CM | POA: Diagnosis not present

## 2019-10-06 ENCOUNTER — Other Ambulatory Visit: Payer: Self-pay | Admitting: Interventional Cardiology

## 2019-10-07 ENCOUNTER — Ambulatory Visit: Payer: Self-pay

## 2019-10-07 NOTE — Chronic Care Management (AMB) (Signed)
  Chronic Care Management   Note  10/07/2019 Name: RAVON TSOU MRN: [id number] DOB: 24-Jun-1941    Attempted outreach with Mr. Halas to discuss the Chronic Care Management program. Per his spouse Vicente Males, he was not available at the time of the call. Reports he has been doing well since returning home. He has multiple appointments over the next few weeks. He also needs to schedule and complete a follow up with Dr. Caryn Section. CCM Case Manager contact information was provided. Mrs. Kowalewski agreed to relay information and follow-up within the next few weeks.  Follow up plan: Pending return call from Mr. Seaberg. Will plan to follow for chronic care management if he agrees to the services.   Horris Latino [facility] Practice/[facility] Care Management [phone]

## 2019-10-07 NOTE — Telephone Encounter (Signed)
That's fine

## 2019-10-10 DIAGNOSIS — M25562 Pain in left knee: Secondary | ICD-10-CM | POA: Diagnosis not present

## 2019-10-10 NOTE — Telephone Encounter (Signed)
Tried [employer]. Left message to call back.

## 2019-10-11 NOTE — Telephone Encounter (Signed)
I called and spoke with Physical therapist Magda Paganini and advised her as below. Magda Paganini states she would contact patient's husband to let her know, and they would call back to schedule a virtual office visit.

## 2019-10-12 DIAGNOSIS — M25562 Pain in left knee: Secondary | ICD-10-CM | POA: Diagnosis not present

## 2019-10-13 ENCOUNTER — Ambulatory Visit (INDEPENDENT_AMBULATORY_CARE_PROVIDER_SITE_OTHER): Payer: Medicare Other | Admitting: *Deleted

## 2019-10-13 ENCOUNTER — Other Ambulatory Visit: Payer: Self-pay

## 2019-10-13 DIAGNOSIS — Z5181 Encounter for therapeutic drug level monitoring: Secondary | ICD-10-CM | POA: Diagnosis not present

## 2019-10-13 DIAGNOSIS — I4891 Unspecified atrial fibrillation: Secondary | ICD-10-CM

## 2019-10-13 LAB — POCT INR: INR: 2.6 (ref 2.0–3.0)

## 2019-10-13 NOTE — Patient Instructions (Signed)
Description   Continue taking 2.5mg  daily except 1.25mg  on Sundays, Tuesdays and Thursdays. Recheck INR in 3 weeks. Coumadin clinic # [phone]. Main # [phone].

## 2019-11-01 ENCOUNTER — Telehealth: Payer: Medicare Other

## 2019-11-01 ENCOUNTER — Ambulatory Visit: Payer: Self-pay

## 2019-11-01 NOTE — Chronic Care Management (AMB) (Signed)
  Chronic Care Management   Outreach Note  11/01/2019 Name: Larry Bautista MRN: [id number] DOB: 1941/03/25  Primary Care Provider: [name], MD Reason for referral : Chronic Care Management   An unsuccessful telephone outreach was attempted today. [name] was referred to the case management team for assistance with care management and care coordination.   A HIPAA compliant voice message was left requesting a return call.   Follow Up Plan:  The care management team will reach out to [name] again next week.   Horris Latino [facility] Care Management [phone]

## 2019-11-03 ENCOUNTER — Ambulatory Visit (INDEPENDENT_AMBULATORY_CARE_PROVIDER_SITE_OTHER): Payer: Medicare Other | Admitting: *Deleted

## 2019-11-03 ENCOUNTER — Other Ambulatory Visit: Payer: Self-pay

## 2019-11-03 DIAGNOSIS — Z5181 Encounter for therapeutic drug level monitoring: Secondary | ICD-10-CM

## 2019-11-03 DIAGNOSIS — I4891 Unspecified atrial fibrillation: Secondary | ICD-10-CM | POA: Diagnosis not present

## 2019-11-03 LAB — POCT INR: INR: 3.1 — AB (ref 2.0–3.0)

## 2019-11-03 NOTE — Patient Instructions (Signed)
Description   Continue taking 2.5mg  daily except 1.25mg  on Sundays, Tuesdays and Thursdays. Eat an extra serving of greens today.  Recheck INR in 4 weeks. Coumadin clinic # [phone]. Main # [phone].

## 2019-11-05 ENCOUNTER — Other Ambulatory Visit: Payer: Self-pay | Admitting: Oncology

## 2019-11-10 ENCOUNTER — Telehealth: Payer: Medicare Other

## 2019-11-10 ENCOUNTER — Ambulatory Visit: Payer: Self-pay

## 2019-11-10 NOTE — Chronic Care Management (AMB) (Signed)
  Chronic Care Management   Outreach Note  11/10/2019 Name: Larry Bautista MRN: [id number] DOB: 1941-07-07  Primary Care Provider: [name], MD Reason for referral : Chronic Care Management   An unsuccessful telephone outreach was attempted today. Larry Bautista was referred to the case management team for assistance with care management and care coordination.   A HIPAA compliant voice message was left requesting a return call.  Follow Up Plan:  The care management team will reach out to Larry Bautista again within the next two weeks.   Horris Latino [facility] Care Management [phone]

## 2019-11-24 ENCOUNTER — Telehealth: Payer: Medicare Other

## 2019-11-24 ENCOUNTER — Ambulatory Visit: Payer: Self-pay

## 2019-11-24 NOTE — Chronic Care Management (AMB) (Signed)
  Chronic Care Management   Outreach Note  11/24/2019 Name: Larry Bautista MRN: [id number] DOB: August 06, 1941   Primary Care Provider: [name], MD Reason for referral : Chronic Care Management   Third unsuccessful telephone outreach was attempted today. [name] was referred to the care management team for assistance with chronic care management and care coordination. His primary care provider will be notified of our unsuccessful attempts to establish and maintain contact. The care management team will gladly outreach at any time in the future if he is interested in receiving assistance.  PLAN The care management team will gladly follow up with [name] after the primary care provider has a conversation with him regarding recommendation for care management engagement and subsequent re-referral for care management services.   Horris Latino [facility] Practice/[facility] Care Management [phone]

## 2019-12-01 ENCOUNTER — Ambulatory Visit (INDEPENDENT_AMBULATORY_CARE_PROVIDER_SITE_OTHER): Payer: Medicare Other

## 2019-12-01 ENCOUNTER — Other Ambulatory Visit: Payer: Self-pay

## 2019-12-01 DIAGNOSIS — I4891 Unspecified atrial fibrillation: Secondary | ICD-10-CM

## 2019-12-01 DIAGNOSIS — Z5181 Encounter for therapeutic drug level monitoring: Secondary | ICD-10-CM | POA: Diagnosis not present

## 2019-12-01 DIAGNOSIS — I480 Paroxysmal atrial fibrillation: Secondary | ICD-10-CM | POA: Diagnosis not present

## 2019-12-01 LAB — POCT INR: INR: 2.5 (ref 2.0–3.0)

## 2019-12-01 NOTE — Patient Instructions (Signed)
Description   Continue taking 2.5mg  daily except 1.25mg  on Sundays, Tuesdays and Thursdays.  Recheck INR in 4 weeks. Coumadin clinic # [phone]. Main # [phone].

## 2019-12-29 ENCOUNTER — Ambulatory Visit (INDEPENDENT_AMBULATORY_CARE_PROVIDER_SITE_OTHER): Payer: Medicare Other | Admitting: *Deleted

## 2019-12-29 ENCOUNTER — Other Ambulatory Visit: Payer: Self-pay

## 2019-12-29 DIAGNOSIS — I4891 Unspecified atrial fibrillation: Secondary | ICD-10-CM

## 2019-12-29 DIAGNOSIS — Z5181 Encounter for therapeutic drug level monitoring: Secondary | ICD-10-CM

## 2019-12-29 DIAGNOSIS — I480 Paroxysmal atrial fibrillation: Secondary | ICD-10-CM | POA: Diagnosis not present

## 2019-12-29 LAB — POCT INR: INR: 2.6 (ref 2.0–3.0)

## 2019-12-29 NOTE — Patient Instructions (Addendum)
Description   Continue taking 2.5mg  daily except 1.25mg  on Sundays, Tuesdays and Thursdays.  Recheck INR in 6 weeks. Coumadin clinic # [phone]. Main # [phone].

## 2020-01-26 ENCOUNTER — Other Ambulatory Visit: Payer: Self-pay | Admitting: Oncology

## 2020-01-30 DIAGNOSIS — M1712 Unilateral primary osteoarthritis, left knee: Secondary | ICD-10-CM | POA: Diagnosis not present

## 2020-01-31 ENCOUNTER — Ambulatory Visit: Payer: Self-pay

## 2020-01-31 NOTE — Telephone Encounter (Signed)
Pt. Reports he has had lower right abdominal pain for 1 year, but getting worse the past 3 weeks. No radiation of pain. No nausea, constipation, or fever. Hurts worse with standing. Pain 5/10. Requests to see Dr. Caryn Section only. Appointment made. Instructed to call back if symptoms worsen. Reason for Disposition . [1] MODERATE pain (e.g., interferes with normal activities) AND [2] pain comes and goes (cramps) AND [3] present > 24 hours  (Exception: pain with Vomiting or Diarrhea - see that Guideline)  Answer Assessment - Initial Assessment Questions 1. LOCATION: "Where does it hurt?"      Right lower abdomen on right 2. RADIATION: "Does the pain shoot anywhere else?" (e.g., chest, back)     No 3. ONSET: "When did the pain begin?" (Minutes, hours or days ago)      Started awhile back but worse 2-3 weeks 4. SUDDEN: "Gradual or sudden onset?"     Gradual 5. PATTERN "Does the pain come and go, or is it constant?"    - If constant: "Is it getting better, staying the same, or worsening?"      (Note: Constant means the pain never goes away completely; most serious pain is constant and it progresses)     - If intermittent: "How long does it last?" "Do you have pain now?"     (Note: Intermittent means the pain goes away completely between bouts)     Comes and goes. Worse with standing. 6. SEVERITY: "How bad is the pain?"  (e.g., Scale 1-10; mild, moderate, or severe)    - MILD (1-3): doesn't interfere with normal activities, abdomen soft and not tender to touch     - MODERATE (4-7): interferes with normal activities or awakens from sleep, tender to touch     - SEVERE (8-10): excruciating pain, doubled over, unable to do any normal activities       5 7. RECURRENT SYMPTOM: "Have you ever had this type of abdominal pain before?" If so, ask: "When was the last time?" and "What happened that time?"      No 8. CAUSE: "What do you think is causing the abdominal pain?"     Unsure 9. RELIEVING/AGGRAVATING  FACTORS: "What makes it better or worse?" (e.g., movement, antacids, bowel movement)     Movement 10. OTHER SYMPTOMS: "Has there been any vomiting, diarrhea, constipation, or urine problems?"       No  Protocols used: ABDOMINAL PAIN - MALE-A-AH

## 2020-02-08 ENCOUNTER — Ambulatory Visit (INDEPENDENT_AMBULATORY_CARE_PROVIDER_SITE_OTHER): Payer: Medicare Other | Admitting: Family Medicine

## 2020-02-08 ENCOUNTER — Telehealth: Payer: Self-pay

## 2020-02-08 ENCOUNTER — Other Ambulatory Visit: Payer: Self-pay

## 2020-02-08 VITALS — BP 140/84 | HR 87 | Temp 98.2°F | Wt 185.0 lb

## 2020-02-08 DIAGNOSIS — C160 Malignant neoplasm of cardia: Secondary | ICD-10-CM | POA: Diagnosis not present

## 2020-02-08 DIAGNOSIS — K4090 Unilateral inguinal hernia, without obstruction or gangrene, not specified as recurrent: Secondary | ICD-10-CM

## 2020-02-08 DIAGNOSIS — Z23 Encounter for immunization: Secondary | ICD-10-CM | POA: Diagnosis not present

## 2020-02-08 DIAGNOSIS — R1031 Right lower quadrant pain: Secondary | ICD-10-CM

## 2020-02-08 NOTE — Telephone Encounter (Signed)
Copied from [place] [phone]. Topic: General - Inquiry >> Feb 08, 2020  1:37 PM Mathis Bud wrote: Reason for CRM: Patient is requesting to go to a general surgeon in [place], not in [place].   Call back [phone]

## 2020-02-08 NOTE — Progress Notes (Signed)
   Acute Office Visit  Subjective:    Patient ID: Larry Bautista, male    DOB: 14-Apr-1941, 79 y.o.   MRN: [id number]  Chief Complaint  Patient presents with  . Abdominal Pain    HPI Patient is in today for abdominal pain.  Pt. Reported to the triage nurse that he has had lower right inguinal pain for 1 year, but getting worse the past 3 weeks.  No radiation of pain. No nausea, constipation, or fever. Hurts worse with standing and walking.  Goes away when he sits down.  Patient feels he is having some swelling in the suprapubic area and when he sits down he feels there is fluid in that area.   He states his wife thinks it is a hernia.  He reports that the swelling area comes and goes.  Lifting something will also cause the pain.  Pain at it's worst is 5/10.  No urinary symptoms.     Allergies  Allergen Reactions  . Celecoxib Rash  . Lisinopril Cough  . Simvastatin Other (See Comments)    Leg pain    Review of Systems  Constitutional: Positive for fatigue (chronic). Negative for chills and fever.  Respiratory: Positive for shortness of breath (chronic). Negative for cough and wheezing.   Cardiovascular: Negative for chest pain, palpitations and leg swelling.  Gastrointestinal: Positive for abdominal distention and abdominal pain. Negative for anal bleeding, blood in stool, constipation, diarrhea, nausea, rectal pain and vomiting.  Genitourinary: Negative for difficulty urinating, dysuria, enuresis, frequency and hematuria.      Objective:    Physical Exam BP 140/84 (BP Location: Right Arm, Patient Position: Sitting, Cuff Size: Normal)   Pulse 87   Temp 98.2 F (36.8 C) (Skin)   Wt 185 lb (83.9 kg)   SpO2 97%   BMI 28.13 kg/m    General appearance:  Overweight male, cooperative and in no acute distress Head: Normocephalic, without obvious abnormality, atraumatic GU/Abd: tender with deep palpation of right inguinal area with slight bulging when standing.        Assessment & Plan:    1. Right lower quadrant abdominal pain   2. Right inguinal hernia (suspected) - Ambulatory referral to General Surgery  3. Adenocarcinoma of gastroesophageal junction (HCC)  - Pneumococcal polysaccharide vaccine 23-valent greater than or equal to 2yo subcutaneous/IM  4. Need for vaccination against Streptococcus pneumoniae  - Pneumococcal polysaccharide vaccine 23-valent greater than or equal to 2yo subcutaneous/IM

## 2020-02-09 ENCOUNTER — Other Ambulatory Visit: Payer: Self-pay

## 2020-02-09 ENCOUNTER — Ambulatory Visit (INDEPENDENT_AMBULATORY_CARE_PROVIDER_SITE_OTHER): Payer: Medicare Other | Admitting: Pharmacist

## 2020-02-09 DIAGNOSIS — Z5181 Encounter for therapeutic drug level monitoring: Secondary | ICD-10-CM | POA: Diagnosis not present

## 2020-02-09 DIAGNOSIS — I480 Paroxysmal atrial fibrillation: Secondary | ICD-10-CM | POA: Diagnosis not present

## 2020-02-09 DIAGNOSIS — I4891 Unspecified atrial fibrillation: Secondary | ICD-10-CM

## 2020-02-09 LAB — POCT INR: INR: 3.1 — AB (ref 2.0–3.0)

## 2020-02-09 NOTE — Patient Instructions (Signed)
Eat some greens tonight and continue taking 2.5mg  daily except 1.25mg  on Sundays, Tuesdays and Thursdays.  Recheck INR in 6 weeks. Coumadin clinic # [phone]. Main # [phone].

## 2020-02-10 ENCOUNTER — Telehealth: Payer: Self-pay

## 2020-02-10 NOTE — Telephone Encounter (Signed)
Copied from [place] [id number]. Topic: General - Other >> Feb 10, 2020  3:08 PM Hinda Lenis D wrote: Reason for CRM PT wants Dr Caryn Section nurse to call him back, he needs a surgeon in [place] / please advise

## 2020-02-13 NOTE — Telephone Encounter (Addendum)
Tried calling patient. Left message to call back. Need more information. Is patient requesting a referral to a surgeon? If so need referral information. What is the reason for the referral, and is this a new problem or has Dr. Caryn Section seen the patient for this problem before? [place] for [facility] triage to discuss with patient.

## 2020-02-13 NOTE — Telephone Encounter (Signed)
Order for referral was placed on 02/08/2020. Patient refused to go to [facility] location. He request a sugeon in [place]. Sarah, do we need to place an new order for gen. Surgery referral? Or can the existing referral order be used?

## 2020-02-13 NOTE — Telephone Encounter (Signed)
Pt called back; the pt states he has talked to Dr Caryn Section regarding the issue of needing a Education officer, environmental; he states he was previously referred to a surgeoun in the [place] area, but he would like to be seen in [place] that is in the  [place] in [place]; the surgery is for suspected hernia; he can be contacted be contacted at [phone]; will route to office for final disposition.

## 2020-02-17 DIAGNOSIS — R3121 Asymptomatic microscopic hematuria: Secondary | ICD-10-CM | POA: Diagnosis not present

## 2020-02-17 DIAGNOSIS — R311 Benign essential microscopic hematuria: Secondary | ICD-10-CM | POA: Diagnosis not present

## 2020-02-17 DIAGNOSIS — K4090 Unilateral inguinal hernia, without obstruction or gangrene, not specified as recurrent: Secondary | ICD-10-CM | POA: Diagnosis not present

## 2020-03-19 ENCOUNTER — Other Ambulatory Visit: Payer: Self-pay | Admitting: Family Medicine

## 2020-03-19 ENCOUNTER — Other Ambulatory Visit: Payer: Self-pay | Admitting: Interventional Cardiology

## 2020-03-19 DIAGNOSIS — M1712 Unilateral primary osteoarthritis, left knee: Secondary | ICD-10-CM | POA: Diagnosis not present

## 2020-03-22 ENCOUNTER — Other Ambulatory Visit: Payer: Self-pay

## 2020-03-22 ENCOUNTER — Ambulatory Visit (INDEPENDENT_AMBULATORY_CARE_PROVIDER_SITE_OTHER): Payer: Medicare Other | Admitting: Pharmacist

## 2020-03-22 DIAGNOSIS — Z5181 Encounter for therapeutic drug level monitoring: Secondary | ICD-10-CM

## 2020-03-22 DIAGNOSIS — I4891 Unspecified atrial fibrillation: Secondary | ICD-10-CM

## 2020-03-22 DIAGNOSIS — Z7901 Long term (current) use of anticoagulants: Secondary | ICD-10-CM | POA: Diagnosis not present

## 2020-03-22 DIAGNOSIS — I480 Paroxysmal atrial fibrillation: Secondary | ICD-10-CM

## 2020-03-22 LAB — POCT INR: INR: 3 (ref 2.0–3.0)

## 2020-03-22 NOTE — Patient Instructions (Addendum)
Description   Eat some greens tonight and continue taking 2.5mg  daily except 1.25mg  on Sundays, Tuesdays and Thursdays.  Recheck INR in 6 weeks. Coumadin clinic # [phone]. Main # [phone].  Have surgeon fax [place] a clearance form is he schedules you for surgery.  Our fax number is [phone].

## 2020-03-23 DIAGNOSIS — R319 Hematuria, unspecified: Secondary | ICD-10-CM | POA: Diagnosis not present

## 2020-03-26 DIAGNOSIS — M1712 Unilateral primary osteoarthritis, left knee: Secondary | ICD-10-CM | POA: Diagnosis not present

## 2020-03-26 DIAGNOSIS — M25511 Pain in right shoulder: Secondary | ICD-10-CM | POA: Diagnosis not present

## 2020-03-27 ENCOUNTER — Other Ambulatory Visit: Payer: Self-pay | Admitting: Surgery

## 2020-03-27 DIAGNOSIS — K4090 Unilateral inguinal hernia, without obstruction or gangrene, not specified as recurrent: Secondary | ICD-10-CM | POA: Diagnosis not present

## 2020-03-30 ENCOUNTER — Telehealth: Payer: Self-pay | Admitting: *Deleted

## 2020-03-30 NOTE — Telephone Encounter (Signed)
Called pt and scheduled appt for pre-op clearance on 04/02/2020 @ 9:45 AM with Truitt Merle, NP. Pt will arrive early wearing a mask.

## 2020-03-30 NOTE — Telephone Encounter (Signed)
Forwarded to requesting party via Epic fax function 

## 2020-03-30 NOTE — Telephone Encounter (Signed)
Patient with diagnosis of afib on warfarin for anticoagulation.    Procedure: RIGHT INGUINAL HERNIA REPAIR  Date of procedure: TBD  CHADS2-VASc score of  5 (CHF, HTN, AGE, CAD, AGE)  Per office protocol, patient can hold warfarin for 5 days prior to procedure.    Patient will NOT need bridging with Lovenox (enoxaparin) around procedure.

## 2020-03-30 NOTE — Telephone Encounter (Signed)
° °  [facility] Pre-operative Risk Assessment    HEARTCARE STAFF: - Please ensure there is not already an duplicate clearance open for this procedure. - Under Visit Info/Reason for Call, type in Other and utilize the format Clearance MM/DD/YY or Clearance TBD. Do not use dashes or single digits. - If request is for dental extraction, please clarify the # of teeth to be extracted.  Request for surgical clearance:  1. What type of surgery is being performed?  RIGHT INGUINAL HERNIA REPAIR WITH MESH   2. When is this surgery scheduled?   TBD  3. What type of clearance is required (medical clearance vs. Pharmacy clearance to hold med vs. Both)?  PHARMACY  4. Are there any medications that need to be held prior to surgery and how long?3 COUMADIN   5. Practice name and name of physician performing surgery?   [facility] / DR. BLACKMAN  6. What is the office phone number?  [phone]   7.   What is the office fax number?  [phone]  8.   Anesthesia type (None, local, MAC, general) ?  GENERAL   Jeanann Lewandowsky 03/30/2020, 11:19 AM  _________________________________________________________________   (provider comments below)

## 2020-03-30 NOTE — Telephone Encounter (Signed)
Primary Cardiologist:Henry Nicholes Stairs III, MD  Chart reviewed as part of pre-operative protocol coverage. Because of BENIGNO CHECK past medical history and time since last visit, he/she will require a follow-up visit in order to better assess preoperative cardiovascular risk.  Pre-op covering staff: - Please schedule appointment and call patient to inform them. - Please contact requesting surgeon's office via preferred method (i.e, phone, fax) to inform them of need for appointment prior to surgery.  If applicable, this message will also be routed to pharmacy pool and/or primary cardiologist for input on holding anticoagulant/antiplatelet agent as requested below so that this information is available at time of patient's appointment.   Deberah Pelton, NP  03/30/2020, 1:40 PM

## 2020-03-31 NOTE — Progress Notes (Signed)
CARDIOLOGY OFFICE NOTE  Date:  04/02/2020    Larry Bautista Date of Birth: [birth year] Medical Record #[id number]  PCP:  [name], MD  Cardiologist:  Tamala Julian  Chief Complaint  Patient presents with  . Pre-op Exam    Seen for Dr. Tamala Julian    History of Present Illness: Larry Bautista is a 79 y.o. male who presents today for a surgical clearance visit. Seen for Dr. Tamala Julian.   He has a history of known CAD with remote stent to the LAD in 2004, persistent atrial fibrillation, anticoagulation therapy with Coumadin, hypertension, and chronic diastolic heart failure.  He was last seen by Dr. Tamala Julian in September of 2020 and was stable from our standpoint. He had been found to have esophageal cancer and had had chemo and radiation and was told he was in remission.   Comes in today. Here alone. To have right inguinal hernia surgery - this has gotten more uncomfortable for him. Coumadin has been addressed by pharmacy. Surgery not scheduled. He feels like he is doing well. No chest pain. He has stable DOE - this is no worse. He is not dizzy or lightheaded. He can complete over 4 mets of activity - he can walk around the block and go up steps - doing activities in the house and some yard work - has gotten more limited by a torn meniscus - he has not wished to have surgery and had some shots instead - using a cane. No falls. Has had a little bit of a rash on his arms - not itchy - was outside yesterday. No bleeding/bruising with his coumadin.    Past Medical History:  Diagnosis Date  . Allergy   . CHF (congestive heart failure) ([place])   . Chronic knee pain   . Coronary artery disease    with LAD DE stent 2004  . Heel spur, left   . Hyperlipidemia   . Hypertension   . Kidney stones   . Lower back pain    status post surgery for spondylolisthesis  . Migraine   . Persistent atrial fibrillation ([place])    longstanding persistent (since 05/2008)  . Plantar fasciitis     Past Surgical  History:  Procedure Laterality Date  . BACK SURGERY     spondylolisthesis  . CARDIAC CATHETERIZATION  03/06/2003  . CORONARY ANGIOPLASTY WITH STENT PLACEMENT  04/2003   CYPHER stent implantation in the LAD  . ESOPHAGOGASTRODUODENOSCOPY (EGD) WITH PROPOFOL N/A 01/07/2018   Procedure: ESOPHAGOGASTRODUODENOSCOPY (EGD) WITH PROPOFOL;  Surgeon: Wonda Horner, MD;  Location: [facility] ENDOSCOPY;  Service: Endoscopy;  Laterality: N/A;  . HAND SURGERY Right 2014   carpal tunnel  . SKIN CANCER EXCISION  1980's   Face  . VASCULAR SURGERY     Vascular Stent     Medications: Current Meds  Medication Sig  . acetaminophen (TYLENOL) 325 MG tablet Take 2 tablets (650 mg total) by mouth QID.  [name] albuterol (PROAIR HFA) 108 (90 Base) MCG/ACT inhaler Inhale 1-2 puffs into the lungs every 6 (six) hours as needed for wheezing or shortness of breath.  . Calcium 200 MG TABS Take by mouth.  . cetirizine (ZYRTEC) 10 MG tablet Take 10 mg by mouth daily.  . diphenhydramine-acetaminophen (TYLENOL PM) 25-500 MG TABS tablet Take 1 tablet by mouth at bedtime as needed.  [name] eplerenone (INSPRA) 25 MG tablet TAKE ONE (1) TABLET EACH DAY  . fluticasone (FLONASE) 50 MCG/ACT nasal spray USE 2 SPRAYS IN  EACH NOSTRIL DAILY  . gabapentin (NEURONTIN) 300 MG capsule Take 300 mg by mouth daily.   . influenza vaccine adjuvanted (FLUAD QUADRIVALENT) 0.5 ML injection Fluad Quad 2020-2021(97yrup)(PF) 60 mcg (15 mcg x 4)/0.555mIM syringe  PHARMACY ADMINISTERED  . metoprolol succinate (TOPROL-XL) 25 MG 24 hr tablet TAKE ONE (1) TABLET EACH DAY  . nitroGLYCERIN (NITROSTAT) 0.4 MG SL tablet Place 1 tablet (0.4 mg total) under the tongue every 5 (five) minutes as needed.  . pantoprazole (PROTONIX) 40 MG tablet Take 1 tablet (40 mg total) by mouth daily. NEEDS TO MAKE APPOINTMENT FOR CONTINUED REFILLS-[phone]  . psyllium (METAMUCIL) 58.6 % powder Take 1 packet by mouth daily.   . rosuvastatin (CRESTOR) 20 MG tablet TAKE ONE (1) TABLET EACH  DAY  . tamsulosin (FLOMAX) 0.4 MG CAPS capsule Take 0.4 mg by mouth daily after supper.  . vitamin C (ASCORBIC ACID) 500 MG tablet Take 500 mg by mouth daily.  . Marland Kitchenarfarin (COUMADIN) 2.5 MG tablet Take as directed by the coumadin clinic  . warfarin (COUMADIN) 5 MG tablet Take as directed by the coumadin clinic  . Zoster Vaccine Adjuvanted ([facility] Campusinjection Shingrix (PF) 50 mcg/0.5 mL intramuscular suspension, kit  PHARMACY ADMINISTERED     Allergies: Allergies  Allergen Reactions  . Celecoxib Rash  . Lisinopril Cough  . Simvastatin Other (See Comments)    Leg pain    Social History: The patient  reports that he quit smoking about 30 years ago. His smoking use included cigarettes. He started smoking about 65 years ago. He has a 70.00 pack-year smoking history. He has never used smokeless tobacco. He reports previous alcohol use of about 2.0 standard drinks of alcohol per week. He reports that he does not use drugs.   Family History: The patient's family history includes Atrial fibrillation in his brother and brother; Bladder Cancer in his brother; COPD in his brother; Colon cancer in his father; Congestive Heart Failure in his brother and father; Heart attack in his father; Lung cancer in his brother and mother; Migraines in his daughter; Stroke in his brother.   Review of Systems: Please see the history of present illness.   All other systems are reviewed and negative.   Physical Exam: VS:  BP 124/80   Pulse 56   Ht _0  (1.727 m)   Wt 184 lb 6.4 oz (83.6 kg)   SpO2 97%   BMI 28.04 kg/m  .  BMI Body mass index is 28.04 kg/m.  Wt Readings from Last 3 Encounters:  04/02/20 184 lb 6.4 oz (83.6 kg)  02/08/20 185 lb (83.9 kg)  08/21/19 175 lb (79.4 kg)    General: Pleasant. Alert and in no acute distress.  He weighed 187 at his last visit here.  Cardiac: Irregular irregular rhythm. Rate is ok. No edema.  Respiratory:  Lungs are clear to auscultation bilaterally with normal  work of breathing.  GI: Soft and nontender.  MS: No deformity or atrophy. Gait and ROM intact.  Skin: Warm and dry. Color is normal. Does have a red splotchy rash on both forearms.  Neuro:  Strength and sensation are intact and no gross focal deficits noted.  Psych: Alert, appropriate and with normal affect.   LABORATORY DATA:  EKG:  EKG is ordered today.  Personally reviewed by me. This demonstrates AF with CVR - HR is 56 today.  Lab Results  Component Value Date   WBC 10.8 (H) 08/25/2019   HGB 14.3 08/25/2019   HCT  43.3 08/25/2019   PLT 231 08/25/2019   GLUCOSE 118 (H) 08/25/2019   CHOL 152 07/22/2019   TRIG 129 07/22/2019   HDL 54 07/22/2019   LDLDIRECT 136.6 08/24/2013   LDLCALC 75 07/22/2019   ALT 19 08/21/2019   AST 24 08/21/2019   NA 140 08/25/2019   K 3.7 08/25/2019   CL 103 08/25/2019   CREATININE 0.72 08/25/2019   BUN 15 08/25/2019   CO2 27 08/25/2019   TSH 0.47 11/06/2016   INR 3.0 03/22/2020   HGBA1C 6.0 (H) 12/11/2017     BNP (last 3 results) No results for input(s): BNP in the last 8760 hours.  ProBNP (last 3 results) No results for input(s): PROBNP in the last 8760 hours.   Other Studies Reviewed Today:    ASSESSMENT & PLAN:    1. Pre op clearance for hernia surgery - felt to be an acceptable candidate for surgery from our standpoint. No worrisome cardiac symptoms. Can complete over 4 mets of activity. Will be available as needed.   2. Persistent AF - managed with rate control and anticoagulation.   3. Chronic coumadin therapy - pharmacy has addressed holding for surgery with NO bridging recommended. He is to hold 5 days prior and restart at direction of general surgery.   4. HTN - BP looks good - would stay on current regimen.   5. CAD with remote stent to the LAD in 2004 - no worrisome symptoms.   6. HLD - on Crestor - he is not fasting today for labs but can get on return.   Current medicines are reviewed with the patient today.  The  patient does not have concerns regarding medicines other than what has been noted above.  The following changes have been made:  See above.  Labs/ tests ordered today include:    Orders Placed This Encounter  Procedures  . EKG 12-Lead     Disposition:   FU with Dr. Tamala Julian as planned later this fall with labs.    Patient is agreeable to this plan and will call if any problems develop in the interim.   SignedTruitt Merle, NP  04/02/2020 10:23 AM  [facility] [address] [place], [place]  [address] Phone: [phone] Fax: [phone]

## 2020-04-02 ENCOUNTER — Encounter: Payer: Self-pay | Admitting: Nurse Practitioner

## 2020-04-02 ENCOUNTER — Other Ambulatory Visit: Payer: Self-pay

## 2020-04-02 ENCOUNTER — Ambulatory Visit (INDEPENDENT_AMBULATORY_CARE_PROVIDER_SITE_OTHER): Payer: Medicare Other | Admitting: Nurse Practitioner

## 2020-04-02 VITALS — BP 124/80 | HR 56 | Ht 68.0 in | Wt 184.4 lb

## 2020-04-02 DIAGNOSIS — I2510 Atherosclerotic heart disease of native coronary artery without angina pectoris: Secondary | ICD-10-CM | POA: Diagnosis not present

## 2020-04-02 DIAGNOSIS — Z7901 Long term (current) use of anticoagulants: Secondary | ICD-10-CM | POA: Diagnosis not present

## 2020-04-02 DIAGNOSIS — I4821 Permanent atrial fibrillation: Secondary | ICD-10-CM | POA: Diagnosis not present

## 2020-04-02 DIAGNOSIS — E7849 Other hyperlipidemia: Secondary | ICD-10-CM | POA: Diagnosis not present

## 2020-04-02 DIAGNOSIS — Z01810 Encounter for preprocedural cardiovascular examination: Secondary | ICD-10-CM | POA: Diagnosis not present

## 2020-04-02 NOTE — Patient Instructions (Addendum)
After Visit Summary:  We will be checking the following labs today - NONE   Medication Instructions:    Continue with your current medicines.    If you need a refill on your cardiac medications before your next appointment, please call your pharmacy.     Testing/Procedures To Be Arranged:  N/A  Follow-Up:   See Dr. Tamala Julian in October.     At [facility], you and your health needs are our priority.  As part of our continuing mission to provide you with exceptional heart care, we have created designated Provider Care Teams.  These Care Teams include your primary Cardiologist (physician) and Advanced Practice Providers (APPs -  Physician Assistants and Nurse Practitioners) who all work together to provide you with the care you need, when you need it.  Special Instructions:  . Stay safe, wash your hands for at least 20 seconds and wear a mask when needed.  . It was good to talk with you today.   . I will send a note over to Dr. Rush Farmer . Hold your Coumadin for 5 days prior to your surgery and restart when Dr. Rush Farmer says to   Call the [place] office at [phone] if you have any questions, problems or concerns.

## 2020-04-03 DIAGNOSIS — M1712 Unilateral primary osteoarthritis, left knee: Secondary | ICD-10-CM | POA: Diagnosis not present

## 2020-04-19 ENCOUNTER — Other Ambulatory Visit: Payer: Self-pay

## 2020-04-19 ENCOUNTER — Encounter (HOSPITAL_BASED_OUTPATIENT_CLINIC_OR_DEPARTMENT_OTHER): Payer: Self-pay | Admitting: Surgery

## 2020-04-23 ENCOUNTER — Other Ambulatory Visit (HOSPITAL_COMMUNITY)
Admission: RE | Admit: 2020-04-23 | Discharge: 2020-04-23 | Disposition: A | Discharge status: Home / Self Care | Payer: Medicare Other | Source: Ambulatory Visit | Attending: Surgery | Admitting: Surgery

## 2020-04-23 DIAGNOSIS — Z01812 Encounter for preprocedural laboratory examination: Secondary | ICD-10-CM | POA: Diagnosis not present

## 2020-04-23 DIAGNOSIS — Z20822 Contact with and (suspected) exposure to covid-19: Secondary | ICD-10-CM | POA: Diagnosis not present

## 2020-04-23 LAB — SARS CORONAVIRUS 2 (TAT 6-24 HRS): SARS Coronavirus 2: NEGATIVE

## 2020-04-25 ENCOUNTER — Encounter (HOSPITAL_BASED_OUTPATIENT_CLINIC_OR_DEPARTMENT_OTHER)
Admission: RE | Admit: 2020-04-25 | Discharge: 2020-04-25 | Disposition: A | Discharge status: Home / Self Care | Payer: Medicare Other | Source: Ambulatory Visit | Attending: Surgery | Admitting: Surgery

## 2020-04-25 ENCOUNTER — Other Ambulatory Visit: Payer: Self-pay

## 2020-04-25 DIAGNOSIS — N400 Enlarged prostate without lower urinary tract symptoms: Secondary | ICD-10-CM | POA: Diagnosis not present

## 2020-04-25 DIAGNOSIS — J45909 Unspecified asthma, uncomplicated: Secondary | ICD-10-CM | POA: Diagnosis not present

## 2020-04-25 DIAGNOSIS — E7800 Pure hypercholesterolemia, unspecified: Secondary | ICD-10-CM | POA: Diagnosis not present

## 2020-04-25 DIAGNOSIS — Z87891 Personal history of nicotine dependence: Secondary | ICD-10-CM | POA: Diagnosis not present

## 2020-04-25 DIAGNOSIS — I509 Heart failure, unspecified: Secondary | ICD-10-CM | POA: Diagnosis not present

## 2020-04-25 DIAGNOSIS — Z85820 Personal history of malignant melanoma of skin: Secondary | ICD-10-CM | POA: Diagnosis not present

## 2020-04-25 DIAGNOSIS — I4891 Unspecified atrial fibrillation: Secondary | ICD-10-CM | POA: Diagnosis not present

## 2020-04-25 DIAGNOSIS — Z7901 Long term (current) use of anticoagulants: Secondary | ICD-10-CM | POA: Diagnosis not present

## 2020-04-25 DIAGNOSIS — Z79899 Other long term (current) drug therapy: Secondary | ICD-10-CM | POA: Diagnosis not present

## 2020-04-25 DIAGNOSIS — I110 Hypertensive heart disease with heart failure: Secondary | ICD-10-CM | POA: Diagnosis not present

## 2020-04-25 DIAGNOSIS — Z955 Presence of coronary angioplasty implant and graft: Secondary | ICD-10-CM | POA: Diagnosis not present

## 2020-04-25 DIAGNOSIS — I2510 Atherosclerotic heart disease of native coronary artery without angina pectoris: Secondary | ICD-10-CM | POA: Diagnosis not present

## 2020-04-25 DIAGNOSIS — K4090 Unilateral inguinal hernia, without obstruction or gangrene, not specified as recurrent: Secondary | ICD-10-CM | POA: Diagnosis not present

## 2020-04-25 LAB — BASIC METABOLIC PANEL
Anion gap: 8 (ref 5–15)
BUN: 12 mg/dL (ref 8–23)
CO2: 27 mmol/L (ref 22–32)
Calcium: 9.5 mg/dL (ref 8.9–10.3)
Chloride: 105 mmol/L (ref 98–111)
Creatinine, Ser: 0.73 mg/dL (ref 0.61–1.24)
GFR calc Af Amer: >60 mL/min (ref >60)
GFR calc non Af Amer: >60 mL/min (ref >60)
Glucose, Bld: 93 mg/dL (ref 70–99)
Potassium: 5 mmol/L (ref 3.5–5.1)
Sodium: 140 mmol/L (ref 135–145)

## 2020-04-25 LAB — PROTIME-INR
INR: 1.2 (ref 0.8–1.2)
Prothrombin Time: 14.9 seconds (ref 11.4–15.2)

## 2020-04-25 MED ORDER — CHLORHEXIDINE GLUCONATE CLOTH 2 % EX PADS: 6.0000 | MEDICATED_PAD | Freq: Once | CUTANEOUS | Status: DC

## 2020-04-25 MED ORDER — ENSURE PRE-SURGERY PO LIQD: 296.0000 mL | Freq: Once | ORAL | Status: DC

## 2020-04-25 NOTE — Progress Notes (Signed)
      Enhanced Recovery after Surgery for Orthopedics Enhanced Recovery after Surgery is a protocol used to improve the stress on your body and your recovery after surgery.  Patient Instructions  . The night before surgery:  o No food after midnight. ONLY clear liquids after midnight  . The day of surgery (if you do NOT have diabetes):  o Drink ONE (1) Pre-Surgery Clear Ensure as directed.   o This drink was given to you during your hospital  pre-op appointment visit. o The pre-op nurse will instruct you on the time to drink the  Pre-Surgery Ensure depending on your surgery time. o Finish the drink at the designated time by the pre-op nurse.  o Nothing else to drink after completing the  Pre-Surgery Clear Ensure.  . The day of surgery (if you have diabetes): o Drink ONE (1) Gatorade 2 (G2) as directed. o This drink was given to you during your hospital  pre-op appointment visit.  o The pre-op nurse will instruct you on the time to drink the   Gatorade 2 (G2) depending on your surgery time. o Color of the Gatorade may vary. Red is not allowed. o Nothing else to drink after completing the  Gatorade 2 (G2).         If you have questions, please contact your surgeon's office.   Pt given instructions on how to use soap given to him at visit.

## 2020-04-25 NOTE — H&P (Signed)
Larry Bautista Appointment: 03/27/2020 3:20 PM Location: [facility] Patient #: [id number] DOB: April 22, 1941 Married / Language: Larry Bautista / Race: White Male   History of Present Illness Larry Bautista A. Ninfa Linden MD; 03/27/2020 3:41 PM) The patient is a 79 year old male who presents with an inguinal hernia.  Chief complaint: Right inguinal hernia  This is a 79 year old gentleman with a history of A. fib on Coumadin who is referred here for a symptomatic right inguinal hernia. He reports he has had inguinal discomfort for several years but is now having an increasingly enlarging bulge in the right groin. He reports reduced. He has pain but no nausea or vomiting. Bowel movements are normal. His medical history does include CHF and has had previous heart stents in the past.   Past Surgical History Malachi Bonds, CMA; 03/27/2020 3:08 PM) Spinal Surgery - Lower Back   Diagnostic Studies History Malachi Bonds, CMA; 03/27/2020 3:08 PM) Colonoscopy  5-10 years ago  Allergies Malachi Bonds, CMA; 03/27/2020 3:09 PM) CeleBREX *ANALGESICS - ANTI-INFLAMMATORY*   Medication History Malachi Bonds, CMA; 03/27/2020 3:11 PM) Albuterol Sulfate (108 (90 Base)MCG/ACT Aero Pow Br Act, Inhalation) Active. Calcium (200MG  Tablet, Oral) Active. Cetirizine HCl (10MG  Tablet, Oral) Active. Eplerenone (25MG  Tablet, Oral) Active. Fluticasone Propionate (50MCG/ACT Suspension, Nasal) Active. Gabapentin (300MG  Capsule, Oral) Active. Metoprolol Succinate ER (25MG  Tablet ER 24HR, Oral) Active. Nitroglycerin (0.4MG  Tab Sublingual, Sublingual) Active. Pantoprazole Sodium (40MG  Tablet DR, Oral) Active. Rosuvastatin Calcium (20MG  Tablet, Oral) Active. Tamsulosin HCl (0.4MG  Capsule, Oral) Active. Warfarin Sodium (2.5MG  Tablet, Oral) Active. Warfarin Sodium (5MG  Tablet, Oral) Active. Vitamin C (500MG  Capsule, Oral) Active. Medications Reconciled  Social History Malachi Bonds, CMA;  03/27/2020 3:08 PM) Alcohol use  Occasional alcohol use. Caffeine use  Carbonated beverages, Coffee. No drug use  Tobacco use  Former smoker.  Family History Malachi Bonds, CMA; 03/27/2020 3:08 PM) Arthritis  Brother, Father, Mother. Cancer  Brother, Mother. Colon Cancer  Father. Heart Disease  Father. Hypertension  Brother, Sister. Migraine Headache  Father. Rectal Cancer  Father. Respiratory Condition  Brother, Mother.  Other Problems Malachi Bonds, CMA; 03/27/2020 3:08 PM) Arthritis  Atrial Fibrillation  Back Pain  Cancer  Enlarged Prostate  Hemorrhoids  High blood pressure  Hypercholesterolemia  Kidney Stone  Melanoma  Migraine Headache  Other disease, cancer, significant illness  Transfusion history     Review of Systems Malachi Bonds CMA; 03/27/2020 3:08 PM) General Present- Weight Gain. Not Present- Appetite Loss, Chills, Fatigue, Fever, Night Sweats and Weight Loss. HEENT Present- Hearing Loss and Ringing in the Ears. Not Present- Earache, Hoarseness, Nose Bleed, Oral Ulcers, Seasonal Allergies, Sinus Pain, Sore Throat, Visual Disturbances, Wears glasses/contact lenses and Yellow Eyes. Respiratory Present- Chronic Cough and Wheezing. Not Present- Bloody sputum, Difficulty Breathing and Snoring. Cardiovascular Present- Rapid Heart Rate and Shortness of Breath. Not Present- Chest Pain, Difficulty Breathing Lying Down, Leg Cramps, Palpitations and Swelling of Extremities. Gastrointestinal Present- Abdominal Pain, Bloating, Excessive gas and Hemorrhoids. Not Present- Bloody Stool, Change in Bowel Habits, Chronic diarrhea, Constipation, Difficulty Swallowing, Gets full quickly at meals, Indigestion, Nausea, Rectal Pain and Vomiting. Male Genitourinary Present- Blood in Urine and Impotence. Not Present- Change in Urinary Stream, Frequency, Nocturia, Painful Urination, Urgency and Urine Leakage. Musculoskeletal Present- Back Pain, Joint Pain and Muscle  Pain. Not Present- Joint Stiffness, Muscle Weakness and Swelling of Extremities. Neurological Present- Decreased Memory, Numbness, Tingling, Trouble walking and Weakness. Not Present- Fainting, Headaches, Seizures and Tremor. Hematology Present- Blood Thinners and Easy Bruising. Not Present- Excessive  bleeding, Gland problems, HIV and Persistent Infections.  Vitals (Chemira Jones CMA; 03/27/2020 3:09 PM) 03/27/2020 3:08 PM Weight: 183 lb Height: 67in Body Surface Area: 1.95 m Body Mass Index: 28.66 kg/m  Pulse: 61 (Regular)  BP: 122/82(Sitting, Left Arm, Standard)       Physical Exam (Toribio Seiber A. Ninfa Linden MD; 03/27/2020 3:41 PM) The physical exam findings are as follows: Note: On exam, he is elderly and appearance  His abdomen is soft and nontender.  There is a large, easily reducible right inguinal hernia without evidence of left inguinal hernia    Assessment & Plan (Aurelius Gildersleeve A. Ninfa Linden MD; 03/27/2020 3:43 PM) RIGHT INGUINAL HERNIA (K40.90) Impression: I discussed the diagnosis of an inguinal hernia with the patient and his wife. I reviewed his notes in the electronic medical records. As he is symptomatic from the hernia and is on chronic anticoagulation medications, and elective hernia repair with mesh was recommended. He will need to come off of his Coumadin for 5 days and will also need clearance by cardiology. I discussed the diagnosis with him in detail. I discussed proceeding with an open inguinal hernia repair with mesh. I rather do this the laparoscopic as will be resuming any granulation medications that day and to limit the anesthesia given his age and medical condition. I discussed the surgical procedure in detail. I discussed the risks of surgery which includes but is not limited to bleeding, infection, injury to surrounding structures, the use of mesh, hernia recurrence, nerve entrapment, chronic pain, cardiopulmonary issues, etc. He understands and wished to proceed  with surgery once clearance from cardiology has been obtained.

## 2020-04-26 ENCOUNTER — Encounter (HOSPITAL_BASED_OUTPATIENT_CLINIC_OR_DEPARTMENT_OTHER)
Admission: RE | Disposition: A | Discharge status: Home / Self Care | Payer: Self-pay | Source: Home / Self Care | Attending: Surgery

## 2020-04-26 ENCOUNTER — Ambulatory Visit (HOSPITAL_BASED_OUTPATIENT_CLINIC_OR_DEPARTMENT_OTHER): Payer: Medicare Other | Admitting: Anesthesiology

## 2020-04-26 ENCOUNTER — Encounter (HOSPITAL_BASED_OUTPATIENT_CLINIC_OR_DEPARTMENT_OTHER): Payer: Self-pay | Admitting: Surgery

## 2020-04-26 ENCOUNTER — Other Ambulatory Visit: Payer: Self-pay

## 2020-04-26 ENCOUNTER — Ambulatory Visit (HOSPITAL_COMMUNITY)
Admission: RE | Admit: 2020-04-26 | Discharge: 2020-04-26 | Disposition: A | Discharge status: Home / Self Care | Payer: Medicare Other | Attending: Surgery | Admitting: Surgery

## 2020-04-26 DIAGNOSIS — I110 Hypertensive heart disease with heart failure: Secondary | ICD-10-CM | POA: Insufficient documentation

## 2020-04-26 DIAGNOSIS — Z955 Presence of coronary angioplasty implant and graft: Secondary | ICD-10-CM | POA: Diagnosis not present

## 2020-04-26 DIAGNOSIS — Z87891 Personal history of nicotine dependence: Secondary | ICD-10-CM | POA: Insufficient documentation

## 2020-04-26 DIAGNOSIS — Z85820 Personal history of malignant melanoma of skin: Secondary | ICD-10-CM | POA: Insufficient documentation

## 2020-04-26 DIAGNOSIS — E7800 Pure hypercholesterolemia, unspecified: Secondary | ICD-10-CM | POA: Diagnosis not present

## 2020-04-26 DIAGNOSIS — J45909 Unspecified asthma, uncomplicated: Secondary | ICD-10-CM | POA: Insufficient documentation

## 2020-04-26 DIAGNOSIS — K4090 Unilateral inguinal hernia, without obstruction or gangrene, not specified as recurrent: Secondary | ICD-10-CM | POA: Diagnosis not present

## 2020-04-26 DIAGNOSIS — I509 Heart failure, unspecified: Secondary | ICD-10-CM | POA: Diagnosis not present

## 2020-04-26 DIAGNOSIS — N400 Enlarged prostate without lower urinary tract symptoms: Secondary | ICD-10-CM | POA: Diagnosis not present

## 2020-04-26 DIAGNOSIS — I2510 Atherosclerotic heart disease of native coronary artery without angina pectoris: Secondary | ICD-10-CM | POA: Diagnosis not present

## 2020-04-26 DIAGNOSIS — I4891 Unspecified atrial fibrillation: Secondary | ICD-10-CM | POA: Insufficient documentation

## 2020-04-26 DIAGNOSIS — Z7901 Long term (current) use of anticoagulants: Secondary | ICD-10-CM | POA: Diagnosis not present

## 2020-04-26 DIAGNOSIS — Z79899 Other long term (current) drug therapy: Secondary | ICD-10-CM | POA: Diagnosis not present

## 2020-04-26 DIAGNOSIS — I10 Essential (primary) hypertension: Secondary | ICD-10-CM | POA: Diagnosis not present

## 2020-04-26 DIAGNOSIS — G8918 Other acute postprocedural pain: Secondary | ICD-10-CM | POA: Diagnosis not present

## 2020-04-26 HISTORY — DX: Cardiac arrhythmia, unspecified: I49.9

## 2020-04-26 HISTORY — PX: INGUINAL HERNIA REPAIR: SHX194

## 2020-04-26 SURGERY — REPAIR, HERNIA, INGUINAL, ADULT
Anesthesia: General | Site: Groin | Laterality: Right

## 2020-04-26 MED ORDER — FENTANYL CITRATE (PF) 100 MCG/2ML IJ SOLN
INTRAMUSCULAR | Status: AC
  Filled 2020-04-26: qty 2

## 2020-04-26 MED ORDER — BUPIVACAINE HCL (PF) 0.5 % IJ SOLN
INTRAMUSCULAR | Status: DC | PRN
  Administered 2020-04-26: 10 mL

## 2020-04-26 MED ORDER — TRAMADOL HCL 50 MG PO TABS: 50.0000 mg | ORAL_TABLET | Freq: Four times a day (QID) | ORAL | 0 refills | Status: DC | PRN

## 2020-04-26 MED ORDER — MIDAZOLAM HCL 2 MG/2ML IJ SOLN
INTRAMUSCULAR | Status: AC
  Filled 2020-04-26: qty 2

## 2020-04-26 MED ORDER — LACTATED RINGERS IV SOLN: INTRAVENOUS | Status: DC

## 2020-04-26 MED ORDER — FENTANYL CITRATE (PF) 100 MCG/2ML IJ SOLN
25.0000 ug | INTRAMUSCULAR | Status: DC | PRN
  Administered 2020-04-26: 25 ug via INTRAVENOUS

## 2020-04-26 MED ORDER — CEFAZOLIN SODIUM-DEXTROSE 2-4 GM/100ML-% IV SOLN
INTRAVENOUS | Status: AC
  Filled 2020-04-26: qty 100

## 2020-04-26 MED ORDER — FENTANYL CITRATE (PF) 100 MCG/2ML IJ SOLN
INTRAMUSCULAR | Status: DC | PRN
Start: 2020-04-26 — End: 2020-04-26
  Administered 2020-04-26: 25 ug via INTRAVENOUS

## 2020-04-26 MED ORDER — DEXAMETHASONE SODIUM PHOSPHATE 10 MG/ML IJ SOLN
INTRAMUSCULAR | Status: DC | PRN
  Administered 2020-04-26: 10 mg

## 2020-04-26 MED ORDER — FENTANYL CITRATE (PF) 100 MCG/2ML IJ SOLN
100.0000 ug | Freq: Once | INTRAMUSCULAR | Status: AC
  Administered 2020-04-26: 100 ug via INTRAVENOUS

## 2020-04-26 MED ORDER — PHENYLEPHRINE 40 MCG/ML (10ML) SYRINGE FOR IV PUSH (FOR BLOOD PRESSURE SUPPORT)
PREFILLED_SYRINGE | INTRAVENOUS | Status: AC
  Filled 2020-04-26: qty 10

## 2020-04-26 MED ORDER — ACETAMINOPHEN 325 MG PO TABS: 325.0000 mg | ORAL_TABLET | ORAL | Status: DC | PRN

## 2020-04-26 MED ORDER — ACETAMINOPHEN 500 MG PO TABS
1000.0000 mg | ORAL_TABLET | ORAL | Status: AC
  Administered 2020-04-26: 1000 mg via ORAL

## 2020-04-26 MED ORDER — PROPOFOL 10 MG/ML IV BOLUS
INTRAVENOUS | Status: DC | PRN
  Administered 2020-04-26: 200 mg via INTRAVENOUS

## 2020-04-26 MED ORDER — PROPOFOL 10 MG/ML IV BOLUS
INTRAVENOUS | Status: AC
  Filled 2020-04-26: qty 20

## 2020-04-26 MED ORDER — MEPERIDINE HCL 25 MG/ML IJ SOLN: 6.2500 mg | INTRAMUSCULAR | Status: DC | PRN

## 2020-04-26 MED ORDER — ACETAMINOPHEN 160 MG/5ML PO SOLN: 325.0000 mg | ORAL | Status: DC | PRN

## 2020-04-26 MED ORDER — OXYCODONE HCL 5 MG/5ML PO SOLN: 5.0000 mg | Freq: Once | ORAL | Status: AC | PRN

## 2020-04-26 MED ORDER — OXYCODONE HCL 5 MG PO TABS
ORAL_TABLET | ORAL | Status: AC
  Filled 2020-04-26: qty 1

## 2020-04-26 MED ORDER — ACETAMINOPHEN 10 MG/ML IV SOLN: 1000.0000 mg | Freq: Once | INTRAVENOUS | Status: DC | PRN

## 2020-04-26 MED ORDER — LIDOCAINE 2% (20 MG/ML) 5 ML SYRINGE
INTRAMUSCULAR | Status: AC
  Filled 2020-04-26: qty 5

## 2020-04-26 MED ORDER — MIDAZOLAM HCL 2 MG/2ML IJ SOLN
2.0000 mg | Freq: Once | INTRAMUSCULAR | Status: AC
  Administered 2020-04-26: 2 mg via INTRAVENOUS

## 2020-04-26 MED ORDER — DEXAMETHASONE SODIUM PHOSPHATE 4 MG/ML IJ SOLN
INTRAMUSCULAR | Status: DC | PRN
  Administered 2020-04-26: 5 mg via INTRAVENOUS

## 2020-04-26 MED ORDER — GABAPENTIN 100 MG PO CAPS: 100.0000 mg | ORAL_CAPSULE | ORAL | Status: DC

## 2020-04-26 MED ORDER — ACETAMINOPHEN 500 MG PO TABS
ORAL_TABLET | ORAL | Status: AC
  Filled 2020-04-26: qty 2

## 2020-04-26 MED ORDER — LIDOCAINE HCL (CARDIAC) PF 100 MG/5ML IV SOSY
PREFILLED_SYRINGE | INTRAVENOUS | Status: DC | PRN
  Administered 2020-04-26: 100 mg via INTRAVENOUS

## 2020-04-26 MED ORDER — ONDANSETRON HCL 4 MG/2ML IJ SOLN
INTRAMUSCULAR | Status: DC | PRN
  Administered 2020-04-26: 4 mg via INTRAVENOUS

## 2020-04-26 MED ORDER — BUPIVACAINE-EPINEPHRINE (PF) 0.5% -1:200000 IJ SOLN
INTRAMUSCULAR | Status: DC | PRN
  Administered 2020-04-26 (×6): 5 mL via PERINEURAL

## 2020-04-26 MED ORDER — ONDANSETRON HCL 4 MG/2ML IJ SOLN: 4.0000 mg | Freq: Once | INTRAMUSCULAR | Status: DC | PRN

## 2020-04-26 MED ORDER — EPHEDRINE 5 MG/ML INJ
INTRAVENOUS | Status: AC
  Filled 2020-04-26: qty 10

## 2020-04-26 MED ORDER — OXYCODONE HCL 5 MG PO TABS
5.0000 mg | ORAL_TABLET | Freq: Once | ORAL | Status: AC | PRN
  Administered 2020-04-26: 5 mg via ORAL

## 2020-04-26 MED ORDER — CEFAZOLIN SODIUM-DEXTROSE 2-4 GM/100ML-% IV SOLN
2.0000 g | INTRAVENOUS | Status: AC
  Administered 2020-04-26: 2 g via INTRAVENOUS

## 2020-04-26 MED ORDER — FENTANYL CITRATE (PF) 100 MCG/2ML IJ SOLN
INTRAMUSCULAR | Status: AC
Start: 2020-04-26 — End: ?
  Filled 2020-04-26: qty 2

## 2020-04-26 MED ORDER — SUCCINYLCHOLINE CHLORIDE 200 MG/10ML IV SOSY
PREFILLED_SYRINGE | INTRAVENOUS | Status: AC
  Filled 2020-04-26: qty 10

## 2020-04-26 SURGICAL SUPPLY — 44 items
ADH SKN CLS APL DERMABOND .7 (GAUZE/BANDAGES/DRESSINGS) ×1
APL PRP STRL LF DISP 70% ISPRP (MISCELLANEOUS) ×1
BLADE CLIPPER SURG (BLADE) ×3 IMPLANT
BLADE SURG 15 STRL LF DISP TIS (BLADE) ×1 IMPLANT
BLADE SURG 15 STRL SS (BLADE) ×3
CANISTER SUCT 1200ML W/VALVE (MISCELLANEOUS) IMPLANT
CHLORAPREP W/TINT 26 (MISCELLANEOUS) ×3 IMPLANT
COVER BACK TABLE 60X90IN (DRAPES) ×3 IMPLANT
COVER MAYO STAND STRL (DRAPES) ×3 IMPLANT
COVER WAND RF STERILE (DRAPES) IMPLANT
DECANTER SPIKE VIAL GLASS SM (MISCELLANEOUS) IMPLANT
DERMABOND ADVANCED (GAUZE/BANDAGES/DRESSINGS) ×2
DERMABOND ADVANCED .7 DNX12 (GAUZE/BANDAGES/DRESSINGS) ×1 IMPLANT
DRAIN PENROSE 1/2X12 LTX STRL (WOUND CARE) ×3 IMPLANT
DRAPE LAPAROTOMY 100X72 PEDS (DRAPES) ×3 IMPLANT
DRAPE UTILITY XL STRL (DRAPES) ×3 IMPLANT
ELECT REM PT RETURN 9FT ADLT (ELECTROSURGICAL) ×3
ELECTRODE REM PT RTRN 9FT ADLT (ELECTROSURGICAL) ×1 IMPLANT
GLOVE SURG SIGNA 7.5 PF LTX (GLOVE) ×3 IMPLANT
GOWN STRL REUS W/ TWL LRG LVL3 (GOWN DISPOSABLE) ×1 IMPLANT
GOWN STRL REUS W/ TWL XL LVL3 (GOWN DISPOSABLE) ×1 IMPLANT
GOWN STRL REUS W/TWL LRG LVL3 (GOWN DISPOSABLE) ×3
GOWN STRL REUS W/TWL XL LVL3 (GOWN DISPOSABLE) ×3
MESH PARIETEX PROGRIP RIGHT (Mesh General) ×2 IMPLANT
NDL HYPO 25X1 1.5 SAFETY (NEEDLE) ×1 IMPLANT
NEEDLE HYPO 25X1 1.5 SAFETY (NEEDLE) ×3 IMPLANT
NS IRRIG 1000ML POUR BTL (IV SOLUTION) IMPLANT
PACK BASIN DAY SURGERY FS (CUSTOM PROCEDURE TRAY) ×3 IMPLANT
PENCIL SMOKE EVACUATOR (MISCELLANEOUS) ×3 IMPLANT
SLEEVE SCD COMPRESS KNEE MED (MISCELLANEOUS) ×3 IMPLANT
SPONGE INTESTINAL PEANUT (DISPOSABLE) IMPLANT
SPONGE LAP 4X18 RFD (DISPOSABLE) ×3 IMPLANT
SUT MNCRL AB 4-0 PS2 18 (SUTURE) ×3 IMPLANT
SUT SILK 2 0 SH (SUTURE) ×2 IMPLANT
SUT VIC AB 2-0 CT1 27 (SUTURE) ×6
SUT VIC AB 2-0 CT1 TAPERPNT 27 (SUTURE) ×2 IMPLANT
SUT VIC AB 3-0 CT1 27 (SUTURE) ×3
SUT VIC AB 3-0 CT1 27XBRD (SUTURE) ×1 IMPLANT
SYR BULB EAR ULCER 3OZ GRN STR (SYRINGE) IMPLANT
SYR CONTROL 10ML LL (SYRINGE) ×3 IMPLANT
TOWEL GREEN STERILE FF (TOWEL DISPOSABLE) ×3 IMPLANT
TUBE CONNECTING 20'X1/4 (TUBING)
TUBE CONNECTING 20X1/4 (TUBING) IMPLANT
YANKAUER SUCT BULB TIP NO VENT (SUCTIONS) IMPLANT

## 2020-04-26 NOTE — Anesthesia Procedure Notes (Signed)
Anesthesia Regional Block: TAP block   Pre-Anesthetic Checklist: ,, timeout performed, Correct Patient, Correct Site, Correct Laterality, Correct Procedure, Correct Position, site marked, Risks and benefits discussed,  Surgical consent,  Pre-op evaluation,  At surgeon's request and post-op pain management  Laterality: Right and N/A  Prep: chloraprep       Needles:  Injection technique: Single-shot  Needle Type: Echogenic Stimulator Needle     Needle Length: 9cm  Needle Gauge: 20   Needle insertion depth: 1.5 cm   Additional Needles:   Procedures:,,,, ultrasound used (permanent image in chart),,,,  Narrative:  Start time: 04/26/2020 12:30 PM End time: 04/26/2020 12:40 PM Injection made incrementally with aspirations every 5 mL.  Performed by: Personally  Anesthesiologist: Lyn Hollingshead, MD

## 2020-04-26 NOTE — Interval H&P Note (Signed)
History and Physical Interval Note: no change in H and P  04/26/2020 1:48 PM  Larry Bautista  has presented today for surgery, with the diagnosis of RIGHT INGUINAL HERNIA.  The various methods of treatment have been discussed with the patient and family. After consideration of risks, benefits and other options for treatment, the patient has consented to  Procedure(s): RIGHT INGUINAL HERNIA REPAIR WITH MESH (Right) as a surgical intervention.  The patient's history has been reviewed, patient examined, no change in status, stable for surgery.  I have reviewed the patient's chart and labs.  Questions were answered to the patient's satisfaction.     Coralie Keens

## 2020-04-26 NOTE — Anesthesia Preprocedure Evaluation (Signed)
Anesthesia Evaluation  Patient identified by MRN, date of birth, ID band Patient awake    Reviewed: Allergy & Precautions, NPO status , Patient's Chart, lab work & pertinent test results, reviewed documented beta blocker date and time   History of Anesthesia Complications Negative for: history of anesthetic complications  Airway Mallampati: I  TM Distance: >3 FB Neck ROM: Full    Dental  (+) Caps, Dental Advisory Given   Pulmonary asthma (inhalers, prednisone) , former smoker,    breath sounds clear to auscultation       Cardiovascular hypertension, Pt. on medications and Pt. on home beta blockers (-) angina+ CAD and + Cardiac Stents  + dysrhythmias Atrial Fibrillation  Rhythm:Irregular Rate:Bradycardia     Neuro/Psych negative neurological ROS  negative psych ROS   GI/Hepatic Neg liver ROS, GI bleed   Endo/Other  negative endocrine ROS  Renal/GU   negative genitourinary   Musculoskeletal   Abdominal Normal abdominal exam  (+)   Peds  Hematology  (+) Blood dyscrasia (Coumadin: INR 1.57), , Hb 8.4 s/p 2u pRBCs   Anesthesia Other Findings   Reproductive/Obstetrics                             Anesthesia Physical  Anesthesia Plan  ASA: III  Anesthesia Plan: General   Post-op Pain Management:    Induction: Intravenous  PONV Risk Score and Plan: 4 or greater and Ondansetron and Treatment may vary due to age or medical condition  Airway Management Planned: Oral ETT  Additional Equipment: None  Intra-op Plan:   Post-operative Plan: Extubation in OR  Informed Consent: I have reviewed the patients History and Physical, chart, labs and discussed the procedure including the risks, benefits and alternatives for the proposed anesthesia with the patient or authorized representative who has indicated his/her understanding and acceptance.     Dental advisory given  Plan Discussed with:  CRNA and Surgeon  Anesthesia Plan Comments:         Anesthesia Quick Evaluation

## 2020-04-26 NOTE — Discharge Instructions (Signed)
[facility]  UMBILICAL OR INGUINAL HERNIA REPAIR: POST OP INSTRUCTIONS  Always review your discharge instruction sheet given to you by the facility where your surgery was performed. IF YOU HAVE DISABILITY OR FAMILY LEAVE FORMS, YOU MUST BRING THEM TO THE OFFICE FOR PROCESSING.   DO NOT GIVE THEM TO YOUR DOCTOR.  1. A  prescription for pain medication may be given to you upon discharge.  Take your pain medication as prescribed, if needed.  If narcotic pain medicine is not needed, then you may take acetaminophen (Tylenol) or ibuprofen (Advil) as needed. No Tylenol until 6:30pm if needed. 2. Take your usually prescribed medications unless otherwise directed. If you need a refill on your pain medication, please contact your pharmacy.  They will contact our office to request authorization. Prescriptions will not be filled after 5 pm or on week-ends. 3. You should follow a light diet the first 24 hours after arrival home, such as soup and crackers, etc.  Be sure to include lots of fluids daily.  Resume your normal diet the day after surgery. 4.Most patients will experience some swelling and bruising around the umbilicus or in the groin and scrotum.  Ice packs and reclining will help.  Swelling and bruising can take several days to resolve.  6. It is common to experience some constipation if taking pain medication after surgery.  Increasing fluid intake and taking a stool softener (such as Colace) will usually help or prevent this problem from occurring.  A mild laxative (Milk of Magnesia or Miralax) should be taken according to package directions if there are no bowel movements after 48 hours. 7. Unless discharge instructions indicate otherwise, you may remove your bandages 24-48 hours after surgery, and you may shower at that time.  You may have steri-strips (small skin tapes) in place directly over the incision.  These strips should be left on the skin for 7-10 days.  If your  surgeon used skin glue on the incision, you may shower in 24 hours.  The glue will flake off over the next 2-3 weeks.  Any sutures or staples will be removed at the office during your follow-up visit. 8. ACTIVITIES:  You may resume regular (light) daily activities beginning the next day--such as daily self-care, walking, climbing stairs--gradually increasing activities as tolerated.  You may have sexual intercourse when it is comfortable.  Refrain from any heavy lifting or straining until approved by your doctor.  a.You may drive when you are no longer taking prescription pain medication, you can comfortably wear a seatbelt, and you can safely maneuver your car and apply brakes. b.RETURN TO WORK:   _____________________________________________  9.You should see your doctor in the office for a follow-up appointment approximately 2-3 weeks after your surgery.  Make sure that you call for this appointment within a day or two after you arrive home to insure a convenient appointment time. 10.OTHER INSTRUCTIONS: _OK TO SHOWER STARTING TOMORROW ICE PACK, TYLENOL ALSO FOR PAIN NO LIFTING MORE THAN 15 TO 20 POUNDS FOR 4 WEEKS________________________    _____________________________________  WHEN TO CALL YOUR DOCTOR: 1. Fever over 101.0 2. Inability to urinate 3. Nausea and/or vomiting 4. Extreme swelling or bruising 5. Continued bleeding from incision. 6. Increased pain, redness, or drainage from the incision  The clinic staff is available to answer your questions during regular business hours.  Please don't hesitate to call and ask to speak to one of the nurses for clinical concerns.  If you have a  medical emergency, go to the nearest emergency room or call 911.  A surgeon from [facility] is always on call at the hospital   [address] [place], [place], [place]  [address] ?  [address], [place], [place]   [address] [phone] ? [phone] ? FAX [phone] Web  site: [email or web address]   Post Anesthesia Home Care Instructions  Activity: Get plenty of rest for the remainder of the day. A responsible individual must stay with you for 24 hours following the procedure.  For the next 24 hours, DO NOT: -Drive a car -Paediatric nurse -Drink alcoholic beverages -Take any medication unless instructed by your physician -Make any legal decisions or sign important papers.  Meals: Start with liquid foods such as gelatin or soup. Progress to regular foods as tolerated. Avoid greasy, spicy, heavy foods. If nausea and/or vomiting occur, drink only clear liquids until the nausea and/or vomiting subsides. Call your physician if vomiting continues.  Special Instructions/Symptoms: Your throat may feel dry or sore from the anesthesia or the breathing tube placed in your throat during surgery. If this causes discomfort, gargle with warm salt water. The discomfort should disappear within 24 hours.  If you had a scopolamine patch placed behind your ear for the management of post- operative nausea and/or vomiting:  1. The medication in the patch is effective for 72 hours, after which it should be removed.  Wrap patch in a tissue and discard in the trash. Wash hands thoroughly with soap and water. 2. You may remove the patch earlier than 72 hours if you experience unpleasant side effects which may include dry mouth, dizziness or visual disturbances. 3. Avoid touching the patch. Wash your hands with soap and water after contact with the patch.

## 2020-04-26 NOTE — Anesthesia Postprocedure Evaluation (Signed)
Anesthesia Post Note  Patient: Larry Bautista  Procedure(s) Performed: RIGHT INGUINAL HERNIA REPAIR WITH MESH (Right Groin)     Patient location during evaluation: Phase II Anesthesia Type: General Level of consciousness: awake Pain management: pain level controlled Vital Signs Assessment: post-procedure vital signs reviewed and stable Respiratory status: spontaneous breathing Cardiovascular status: stable Postop Assessment: no apparent nausea or vomiting Anesthetic complications: no   No complications documented.  Last Vitals:  Vitals:   04/26/20 1545 04/26/20 1603  BP: (!) 143/84 (!) 152/85  Pulse: 62 76  Resp: 17 18  Temp:  36.6 C  SpO2: 95% 98%    Last Pain:  Vitals:   04/26/20 1603  PainSc: 3                  John [address]

## 2020-04-26 NOTE — Anesthesia Procedure Notes (Signed)
Procedure Name: LMA Insertion Date/Time: 04/26/2020 2:15 PM Performed by: Willa Frater, CRNA Pre-anesthesia Checklist: Patient identified, Emergency Drugs available, Suction available and Patient being monitored Patient Re-evaluated:Patient Re-evaluated prior to induction Oxygen Delivery Method: Circle system utilized Preoxygenation: Pre-oxygenation with 100% oxygen Induction Type: IV induction Ventilation: Mask ventilation without difficulty LMA: LMA inserted LMA Size: 5.0 Number of attempts: 1 Airway Equipment and Method: Bite block Placement Confirmation: positive ETCO2 Tube secured with: Tape Dental Injury: Teeth and Oropharynx as per pre-operative assessment

## 2020-04-26 NOTE — Op Note (Signed)
RIGHT INGUINAL HERNIA REPAIR WITH MESH  Procedure Note  Larry Bautista 04/26/2020   Pre-op Diagnosis: RIGHT INGUINAL HERNIA     Post-op Diagnosis: same  Procedure(s): RIGHT INGUINAL HERNIA REPAIR WITH MESH  Surgeon(s): Coralie Keens, MD  Anesthesia: General  Staff:  Circulator: Patric Dykes, RN Relief Circulator: McDonough-Hughes, Delene Ruffini, RN Scrub Person: Arnetha Massy A  Estimated Blood Loss: Minimal               Findings: The patient was found to have an indirect right inguinal hernia which was repaired with a piece of progrip Prolene mesh from [employer]  Procedure: The patient was brought to the operating room and identifies the correct patient.  He was placed upon on the operating room table and general anesthesia was induced.  He had received a preoperative tap block by anesthesia in the holding area.  His abdomen was prepped and draped in usual sterile fashion.  I anesthetized the skin of the right inguinal area with Marcaine.  I then made a longitudinal incision with a scalpel I then dissected down through Scarpa's fascia with electrocautery.  The external oblique fascia was then opened toward the internal and external rings.  This testicular cord and structures were controlled with a Penrose drain.  The patient had a large indirect hernia sac.  All contents were reduced from the sac.  I then dissected the sac down to the base.  I tied off the base with a 2-0 silk suture and then excised the redundant sac.  Next a piece of large ProGrip Prolene mesh was brought to the field.  I placed that against the pubic tubercle and then brought around the cord structures and secured in place.  I then sutured the mesh in place with 2 separate 2-0 Vicryl sutures.  Wide coverage of the internal ring and inguinal floor was achieved.  I then closed the external beak fascia over the top of this with a running 2-0 Vicryl suture.  Scarpa's fascia was then closed interrupted 3-0 Vicryl  sutures and the skin was closed with a running 4-0 Monocryl.  Dermabond was then applied.  The patient tolerated the procedure well.  All the counts were correct at the end of the procedure.  The patient was then extubated in the operating room and taken in stable condition to the recovery room.          Coralie Keens   Date: 04/26/2020  Time: 2:57 PM

## 2020-04-26 NOTE — Transfer of Care (Signed)
Immediate Anesthesia Transfer of Care Note  Patient: Larry Bautista  Procedure(s) Performed: RIGHT INGUINAL HERNIA REPAIR WITH MESH (Right Groin)  Patient Location: PACU  Anesthesia Type:GA combined with regional for post-op pain  Level of Consciousness: sedated  Airway & Oxygen Therapy: Patient Spontanous Breathing and Patient connected to face mask oxygen  Post-op Assessment: Report given to RN and Post -op Vital signs reviewed and stable  Post vital signs: Reviewed and stable  Last Vitals:  Vitals Value Taken Time  BP    Temp    Pulse    Resp 14 04/26/20 1505  SpO2    Vitals shown include unvalidated device data.  Last Pain:  Vitals:   04/26/20 1206  PainSc: 0-No pain      Patients Stated Pain Goal: 3 (41/74/08 1448)  Complications: No complications documented.

## 2020-04-27 ENCOUNTER — Encounter (HOSPITAL_BASED_OUTPATIENT_CLINIC_OR_DEPARTMENT_OTHER): Payer: Self-pay | Admitting: Surgery

## 2020-04-27 NOTE — Addendum Note (Signed)
Addendum  created 04/27/20 1135 by Tawni Millers, CRNA   Charge Capture section accepted

## 2020-05-03 ENCOUNTER — Ambulatory Visit (INDEPENDENT_AMBULATORY_CARE_PROVIDER_SITE_OTHER): Payer: Medicare Other | Admitting: Pharmacist

## 2020-05-03 ENCOUNTER — Other Ambulatory Visit: Payer: Self-pay

## 2020-05-03 DIAGNOSIS — Z5181 Encounter for therapeutic drug level monitoring: Secondary | ICD-10-CM | POA: Diagnosis not present

## 2020-05-03 DIAGNOSIS — I4891 Unspecified atrial fibrillation: Secondary | ICD-10-CM

## 2020-05-03 DIAGNOSIS — I480 Paroxysmal atrial fibrillation: Secondary | ICD-10-CM | POA: Diagnosis not present

## 2020-05-03 LAB — POCT INR: INR: 2 (ref 2.0–3.0)

## 2020-05-03 NOTE — Patient Instructions (Signed)
Description   Take 2.5mg  today, then continue taking 2.5mg  daily except 1.25mg  on Sundays, Tuesdays and Thursdays.  Recheck INR in 6 weeks. Coumadin clinic # [phone]. Main # [phone].

## 2020-05-11 DIAGNOSIS — Z23 Encounter for immunization: Secondary | ICD-10-CM | POA: Diagnosis not present

## 2020-05-16 ENCOUNTER — Other Ambulatory Visit: Payer: Self-pay | Admitting: Interventional Cardiology

## 2020-05-16 ENCOUNTER — Other Ambulatory Visit: Payer: Self-pay | Admitting: Oncology

## 2020-05-16 MED ORDER — METOPROLOL SUCCINATE ER 25 MG PO TB24: ORAL_TABLET | ORAL | 3 refills | Status: DC

## 2020-05-17 ENCOUNTER — Encounter: Payer: Self-pay | Admitting: *Deleted

## 2020-05-17 ENCOUNTER — Telehealth: Payer: Self-pay | Admitting: Oncology

## 2020-05-17 NOTE — Telephone Encounter (Signed)
Scheduled apt per 9/9 schedule message - pt is aware of apt date and time

## 2020-05-17 NOTE — Progress Notes (Signed)
Electronic refill request for Protonix received. Last refill of #90 stated he needs appointment for future refills. Refilled script now for #30 with 2nd notice to make appointment. Scheduling message sent to get him in in the next month with Dr. Benay Spice or NP.

## 2020-06-14 ENCOUNTER — Ambulatory Visit (INDEPENDENT_AMBULATORY_CARE_PROVIDER_SITE_OTHER): Payer: Medicare Other | Admitting: *Deleted

## 2020-06-14 ENCOUNTER — Other Ambulatory Visit: Payer: Self-pay

## 2020-06-14 DIAGNOSIS — Z5181 Encounter for therapeutic drug level monitoring: Secondary | ICD-10-CM

## 2020-06-14 DIAGNOSIS — I480 Paroxysmal atrial fibrillation: Secondary | ICD-10-CM

## 2020-06-14 DIAGNOSIS — I4891 Unspecified atrial fibrillation: Secondary | ICD-10-CM | POA: Diagnosis not present

## 2020-06-14 LAB — POCT INR: INR: 3.3 — AB (ref 2.0–3.0)

## 2020-06-14 NOTE — Patient Instructions (Addendum)
Description   Hold today's dose then continue taking 2.5mg  daily except 1.25mg  on Sundays, Tuesdays and Thursdays.  Recheck INR in 3 weeks (normally 6 weeks). Coumadin clinic # [phone]. Main # [phone].

## 2020-06-20 DIAGNOSIS — M5442 Lumbago with sciatica, left side: Secondary | ICD-10-CM | POA: Diagnosis not present

## 2020-06-21 ENCOUNTER — Telehealth: Payer: Self-pay | Admitting: Oncology

## 2020-06-21 ENCOUNTER — Inpatient Hospital Stay: Payer: Medicare Other | Attending: Oncology | Admitting: Oncology

## 2020-06-21 ENCOUNTER — Other Ambulatory Visit: Payer: Self-pay

## 2020-06-21 VITALS — BP 140/88 | HR 63 | Temp 96.9°F | Resp 17 | Ht 68.0 in | Wt 186.0 lb

## 2020-06-21 DIAGNOSIS — I4891 Unspecified atrial fibrillation: Secondary | ICD-10-CM | POA: Insufficient documentation

## 2020-06-21 DIAGNOSIS — I2510 Atherosclerotic heart disease of native coronary artery without angina pectoris: Secondary | ICD-10-CM | POA: Insufficient documentation

## 2020-06-21 DIAGNOSIS — Z87891 Personal history of nicotine dependence: Secondary | ICD-10-CM | POA: Diagnosis not present

## 2020-06-21 DIAGNOSIS — C160 Malignant neoplasm of cardia: Secondary | ICD-10-CM | POA: Insufficient documentation

## 2020-06-21 DIAGNOSIS — R0609 Other forms of dyspnea: Secondary | ICD-10-CM | POA: Diagnosis not present

## 2020-06-21 DIAGNOSIS — R911 Solitary pulmonary nodule: Secondary | ICD-10-CM | POA: Diagnosis not present

## 2020-06-21 DIAGNOSIS — Z7901 Long term (current) use of anticoagulants: Secondary | ICD-10-CM | POA: Diagnosis not present

## 2020-06-21 DIAGNOSIS — Z87442 Personal history of urinary calculi: Secondary | ICD-10-CM | POA: Diagnosis not present

## 2020-06-21 DIAGNOSIS — Z923 Personal history of irradiation: Secondary | ICD-10-CM | POA: Diagnosis not present

## 2020-06-21 NOTE — Progress Notes (Signed)
  [place] OFFICE PROGRESS NOTE   Diagnosis: Gastroesophageal cancer  INTERVAL HISTORY:   Larry Bautista was last seen at the Cancer center in February 2020.  He has a history of multiple intervening illnesses including COVID-19 infection, a left knee hemorrhage, and hernia repair.  No dysphagia.  Good appetite.  He says he lost his toenails following treatment for esophagus cancer.  The toenails have regrown.  Objective:  Vital signs in last 24 hours:  Blood pressure 140/88, pulse 63, temperature (!) 96.9 F (36.1 C), temperature source Tympanic, resp. rate 17, height 5\' 8"  (1.727 m), weight 186 lb (84.4 kg), SpO2 99 %.    HEENT: Neck without mass Lymphatics: No cervical, supraclavicular, axillary, or inguinal nodes Resp: End inspiratory rhonchi at the left posterior base, no respiratory distress Cardio: Irregular GI: No mass, nontender, no hepatosplenomegaly Vascular: No leg edema  Skin: Toenail exam is unremarkable.  There is a firm approximate 2 cm area of subcutaneous fullness at the medial right axilla (appearance of an inclusion cyst or sebaceous cyst)  Portacath/PICC-without erythema  Lab Results:  Lab Results  Component Value Date   WBC 10.8 (H) 08/25/2019   HGB 14.3 08/25/2019   HCT 43.3 08/25/2019   MCV 97.7 08/25/2019   PLT 231 08/25/2019   NEUTROABS 15.0 (H) 08/21/2019    Lab Results  Component Value Date   CEA1 <1.00 01/19/2018     Medications: I have reviewed the patient's current medications.   Assessment/Plan:  1. Adenocarcinoma of the GE junction ? Partially obstructing mass noted at the GE junction on endoscopy 01/07/2018, nonobstructing ? CTs 01/14/2018, sub-solid 5 mm right upper lobe nodule, mild eccentric wall thickening at the GE junction ? PET scan 01/27/2018-hypermetabolism at the GE junction, no evidence of metastatic disease ? Initiation of radiation and weekly Taxol/carboplatin 02/03/2018 ? Week 2 Taxol/carboplatin  02/10/2018 ? Week 3 Taxol/carboplatin 02/17/2018 ? Week 4 Taxol/carboplatin 02/24/2018 ? Week 5 Taxol/carboplatin 03/03/2018 ? Radiation completed 03/17/2018 ? CTs 05/06/2018- unchanged right upper lobe nodule, esophagus appears normal 2. History of GI bleeding secondary to #1 3. Atrial fibrillation-maintained on Coumadin 4. Coronary artery disease 5. Chronic exertional dyspnea-likely COPD 6. History of tobacco use 7. History of kidney stones 8. Focus of hypermetabolism at the right prostate on the PET scan 01/27/2018, PSA normal on 02/10/2018 9. History of thrombocytopenia secondary to chemotherapy 10. Odynophagia secondary to chemotherapy/radiation-resolved 11. Rash-most likely secondary to radiation, though the rash was in an atypical distribution;  resolved     Disposition: Larry Bautista is in remission from gastroesophageal cancer.  He will return for an office visit in 6 months.  The cutaneous nodule in the right axilla is likely a benign cyst.  He will seek medical attention if this area enlarges.   Betsy Coder, MD  06/21/2020  11:24 AM

## 2020-06-21 NOTE — Telephone Encounter (Signed)
Scheduled appointments per 10/14 los. Spoke to patient who is aware of appointment date and time. Gave patient calendar print out.

## 2020-06-22 ENCOUNTER — Other Ambulatory Visit: Payer: Self-pay | Admitting: *Deleted

## 2020-06-22 MED ORDER — PANTOPRAZOLE SODIUM 40 MG PO TBEC
40.0000 mg | DELAYED_RELEASE_TABLET | Freq: Every day | ORAL | 2 refills | Status: DC
Start: 2020-06-22 — End: 2020-06-22

## 2020-06-22 MED ORDER — PANTOPRAZOLE SODIUM 40 MG PO TBEC
40.0000 mg | DELAYED_RELEASE_TABLET | Freq: Every day | ORAL | 2 refills | Status: DC
Start: 2020-06-22 — End: 2021-02-07

## 2020-06-22 NOTE — Progress Notes (Signed)
Electronic refill for pantoprazole did not go thru. Called in refill.

## 2020-06-22 NOTE — Progress Notes (Signed)
Refilled pantoprazole per Dr. Benay Spice.

## 2020-07-01 NOTE — Progress Notes (Signed)
Cardiology Office Note:    Date:  07/02/2020   ID:  Larry Bautista, DOB [birth year], MRN [id number]  PCP:  Larry Sons, MD  Cardiologist:  Larry Grooms, MD   Referring MD: Larry Sons, MD   Chief Complaint  Patient presents with   Coronary Artery Disease   Atrial Fibrillation   Hyperlipidemia   Advice Only    Paresthesias hands and legs    History of Present Illness:    Larry Bautista is a 79 y.o. male with a hx of CAD, chronic atrial fibrillation, anticoagulation therapy, hypertension, and chronic diastolic heart failure.  Larry Bautista had his INR checked earlier today. He is status post chemotherapy and radiation for adenocarcinoma of the esophagus in July 2019. He is swallowing well. Appetite has been good.  Since chemotherapy has not been able to walk as consistently because of difficulty with balance and paresthesias. Only once has he fallen. He does state that there is dyspnea if he rushes. There is no associated chest discomfort. He denies orthopnea.  He denies palpitations. He has not had syncope.  Past Medical History:  Diagnosis Date   Allergy    CHF (congestive heart failure) (HCC)    Chronic knee pain    Coronary artery disease    with LAD DE stent 2004   Dysrhythmia    Heel spur, left    Hyperlipidemia    Hypertension    Kidney stones    Lower back pain    status post surgery for spondylolisthesis   Migraine    Persistent atrial fibrillation ([place])    longstanding persistent (since 05/2008)   Plantar fasciitis     Past Surgical History:  Procedure Laterality Date   BACK SURGERY     spondylolisthesis   CARDIAC CATHETERIZATION  03/06/2003   CORONARY ANGIOPLASTY WITH STENT PLACEMENT  04/2003   CYPHER stent implantation in the LAD   ESOPHAGOGASTRODUODENOSCOPY (EGD) WITH PROPOFOL N/A 01/07/2018   Procedure: ESOPHAGOGASTRODUODENOSCOPY (EGD) WITH PROPOFOL;  Surgeon: Larry Horner, MD;  Location: [facility];  Service:  Endoscopy;  Laterality: N/A;   HAND SURGERY Right 2014   carpal tunnel   INGUINAL HERNIA REPAIR Right 04/26/2020   Procedure: RIGHT INGUINAL HERNIA REPAIR WITH MESH;  Surgeon: Larry Keens, MD;  Location: [facility];  Service: General;  Laterality: Right;   SKIN CANCER EXCISION  1980's   Face   VASCULAR SURGERY     Vascular Stent    Current Medications: Current Meds  Medication Sig   acetaminophen (TYLENOL) 325 MG tablet Take 2 tablets (650 mg total) by mouth QID.   albuterol (PROAIR HFA) 108 (90 Base) MCG/ACT inhaler Inhale 1-2 puffs into the lungs every 6 (six) hours as needed for wheezing or shortness of breath.   Calcium 200 MG TABS Take by mouth.   cetirizine (ZYRTEC) 10 MG tablet Take 10 mg by mouth daily.   diphenhydramine-acetaminophen (TYLENOL PM) 25-500 MG TABS tablet Take 1 tablet by mouth at bedtime as needed.   eplerenone (INSPRA) 25 MG tablet TAKE ONE (1) TABLET EACH DAY   fluticasone (FLONASE) 50 MCG/ACT nasal spray USE 2 SPRAYS IN EACH NOSTRIL DAILY   gabapentin (NEURONTIN) 300 MG capsule Take 300 mg by mouth daily.    metoprolol succinate (TOPROL-XL) 25 MG 24 hr tablet TAKE ONE (1) TABLET EACH DAY   nitroGLYCERIN (NITROSTAT) 0.4 MG SL tablet Place 1 tablet (0.4 mg total) under the tongue every 5 (five) minutes as needed.  pantoprazole (PROTONIX) 40 MG tablet Take 1 tablet (40 mg total) by mouth daily.   psyllium (METAMUCIL) 58.6 % powder Take 1 packet by mouth daily.    rosuvastatin (CRESTOR) 20 MG tablet TAKE ONE (1) TABLET EACH DAY   tamsulosin (FLOMAX) 0.4 MG CAPS capsule Take 0.4 mg by mouth daily after supper.   traMADol (ULTRAM) 50 MG tablet Take 1 tablet (50 mg total) by mouth every 6 (six) hours as needed for severe pain.   vitamin C (ASCORBIC ACID) 500 MG tablet Take 500 mg by mouth daily.   warfarin (COUMADIN) 2.5 MG tablet Take as directed by the coumadin clinic   warfarin (COUMADIN) 5 MG tablet Take as directed by  the coumadin clinic     Allergies:   Celecoxib, Lisinopril, and Simvastatin   Social History   Socioeconomic History   Marital status: Married    Spouse name: Larry Bautista   Number of children: 1   Years of education: Not on file   Highest education level: Bachelor's degree (e.g., BA, AB, BS)  Occupational History   Occupation: retired  Tobacco Use   Smoking status: Former Smoker    Packs/day: 2.00    Years: 35.00    Pack years: 70.00    Types: Cigarettes    Start date: 09/08/1954    Quit date: 11/06/1989    Years since quitting: 30.6   Smokeless tobacco: Never Used   Tobacco comment: Smoked from [phone] (35 years)  Vaping Use   Vaping Use: Never used  Substance and Sexual Activity   Alcohol use: Not Currently    Alcohol/week: 2.0 standard drinks    Types: 2 Standard drinks or equivalent per week    Comment: 1961-2009 (Last drink 2009)    Drug use: No   Sexual activity: Not on file  Other Topics Concern   Not on file  Social History Narrative   Retired Insurance underwriter. Married in 2015. Has one daughter, and 4 grandchildren. He lives in [place].   Social Determinants of Health   Financial Resource Strain:    Difficulty of Paying Living Expenses: Not on file  Food Insecurity:    Worried About Charity fundraiser in the Last Year: Not on file   [employer] of Food in the Last Year: Not on file  Transportation Needs:    Lack of Transportation (Medical): Not on file   Lack of Transportation (Non-Medical): Not on file  Physical Activity:    Days of Exercise per Week: Not on file   Minutes of Exercise per Session: Not on file  Stress:    Feeling of Stress : Not on file  Social Connections:    Frequency of Communication with Friends and Family: Not on file   Frequency of Social Gatherings with Friends and Family: Not on file   Attends Religious Services: Not on file   Active Member of Clubs or Organizations: Not on file   Attends Theatre manager Meetings: Not on file   Marital Status: Not on file     Family History: The patient's family history includes Atrial fibrillation in his brother and brother; Bladder Cancer in his brother; COPD in his brother; Colon cancer in his father; Congestive Heart Failure in his brother and father; Heart attack in his father; Lung cancer in his brother and mother; Migraines in his daughter; Stroke in his brother.  ROS:   Please see the history of present illness.    Has history of chronic low  back disease. Has had paresthesias and difficulty with balance since chemo/radiation therapy for his esophagus. He has had no significant bleeding related to Coumadin use. All other systems reviewed and are negative.  EKGs/Labs/Other Studies Reviewed:    The following studies were reviewed today: Cardiac MRI 2013:\ IMPRESSION:  1. Suboptimal images due to gating difficulty from irregular heart  rate.   2. Normal LV size and systolic function.   3. Mildly dilated RV with normal systolic function. No RV mass was  noted.   4. No myocardial delayed enhancement, so no definitive evidence for  prior MI, infiltrative disease, or myocarditis.    EKG:  EKG April 02, 2020 demonstrated atrial fib, controlled rate, left axis deviation, poor R wave progression. Borderline low voltage in the limb leads. No change from prior tracings.  Recent Labs: 08/21/2019: ALT 19 08/25/2019: Hemoglobin 14.3; Platelets 231 04/25/2020: BUN 12; Creatinine, Ser 0.73; Potassium 5.0; Sodium 140  Recent Lipid Panel    Component Value Date/Time   CHOL 152 07/22/2019 1036   TRIG 129 07/22/2019 1036   HDL 54 07/22/2019 1036   CHOLHDL 2.8 07/22/2019 1036   CHOLHDL 3 01/24/2015 0926   VLDL 26.8 01/24/2015 0926   LDLCALC 75 07/22/2019 1036   LDLDIRECT 136.6 08/24/2013 0938    Physical Exam:    VS:  BP 118/64    Pulse 62    Ht 5\' 7"  (1.702 m)    Wt 188 lb (85.3 kg)    SpO2 96%    BMI 29.44 kg/m     Wt Readings from  Last 3 Encounters:  07/02/20 188 lb (85.3 kg)  06/21/20 186 lb (84.4 kg)  04/26/20 182 lb 5.1 oz (82.7 kg)     GEN: Elderly and frail.. No acute distress HEENT: Normal NECK: No JVD. LYMPHATICS: No lymphadenopathy CARDIAC: Irregularly irregular RR without murmur or gallop. However, there is bilateral 1+ ankle to shin edema. VASCULAR:  Normal Pulses. No bruits. RESPIRATORY:  Clear to auscultation without rales, wheezing or rhonchi  ABDOMEN: Soft, non-tender, non-distended, No pulsatile mass, MUSCULOSKELETAL: No deformity  SKIN: Warm and dry NEUROLOGIC: Guarded gait and requires a cane to walk. Alert and oriented x 3 PSYCHIATRIC:  Normal affect   ASSESSMENT:    1. Chronic atrial fibrillation ([place])   2. Current use of long term anticoagulation   3. CAD in native artery   4. Essential hypertension   5. Other hyperlipidemia   6. Educated about COVID-19 virus infection    PLAN:    In order of problems listed above:  1. He has a controlled ventricular response. Toprol-XL is being used to control rate. He is also on Coumadin therapy to prevent stroke. His Chads Vas score 4. 2. Continue Coumadin therapy. Next INR November 17. 3. Secondary prevention discussed in detail. He is not getting physical activity as he once did. I encouraged him to try increasing episodes of walking but he needs to be careful not to fall given neuropathy.  4. Blood pressure control is excellent on his current regimen which should be continued. That includes Toprol-XL 25 mg/day, Inspra 25 mg/day, and salt restriction. 5. Continue Crestor 20 mg/day. Lipid panel will be done fasting in 2 weeks. 6. He has been vaccinated and has not suffered COVID-19 disease.  Overall education and awareness concerning primary/secondary risk prevention was discussed in detail: LDL less than 70, hemoglobin A1c less than 7, blood pressure target less than 130/80 mmHg, >150 minutes of moderate aerobic activity per week, avoidance of  smoking, weight control (via diet and exercise), and continued surveillance/management of/for obstructive sleep apnea. Accurately evaluate hands   Medication Adjustments/Labs and Tests Ordered: Current medicines are reviewed at length with the patient today.  Concerns regarding medicines are outlined above.  Orders Placed This Encounter  Procedures   Lipid panel   Hepatic function panel   No orders of the defined types were placed in this encounter.   Patient Instructions  Medication Instructions:  Your physician recommends that you continue on your current medications as directed. Please refer to the Current Medication list given to you today.  *If you need a refill on your cardiac medications before your next appointment, please call your pharmacy*   Lab Work: Lipid and Liver at the time of your next Coumadin appointment.  You will need to be fasting (nothing to eat or drink after midnight except water and black coffee).  If you have labs (blood work) drawn today and your tests are completely normal, you will receive your results only by:  [place] (if you have MyChart) OR  A paper copy in the mail If you have any lab test that is abnormal or we need to change your treatment, we will call you to review the results.   Testing/Procedures: None   Follow-Up: At [facility], you and your health needs are our priority.  As part of our continuing mission to provide you with exceptional heart care, we have created designated Provider Care Teams.  These Care Teams include your primary Cardiologist (physician) and Advanced Practice Providers (APPs -  Physician Assistants and Nurse Practitioners) who all work together to provide you with the care you need, when you need it.  We recommend signing up for the patient portal called "MyChart".  Sign up information is provided on this After Visit Summary.  MyChart is used to connect with patients for Virtual Visits (Telemedicine).   Patients are able to view lab/test results, encounter notes, upcoming appointments, etc.  Non-urgent messages can be sent to your provider as well.   To learn more about what you can do with MyChart, go to [email or web address].    Your next appointment:   12 month(s)  The format for your next appointment:   In Person  Provider:   You may see Larry Grooms, MD or one of the following Advanced Practice Providers on your designated Care Team:    Truitt Merle, NP  Cecilie Kicks, NP  Kathyrn Drown, NP    Other Instructions      Signed, Larry Grooms, MD  07/02/2020 2:09 PM    [place]

## 2020-07-02 ENCOUNTER — Ambulatory Visit (INDEPENDENT_AMBULATORY_CARE_PROVIDER_SITE_OTHER): Payer: Medicare Other | Admitting: Interventional Cardiology

## 2020-07-02 ENCOUNTER — Encounter: Payer: Self-pay | Admitting: Interventional Cardiology

## 2020-07-02 ENCOUNTER — Other Ambulatory Visit: Payer: Self-pay

## 2020-07-02 ENCOUNTER — Ambulatory Visit (INDEPENDENT_AMBULATORY_CARE_PROVIDER_SITE_OTHER): Payer: Medicare Other | Admitting: *Deleted

## 2020-07-02 VITALS — BP 118/64 | HR 62 | Ht 67.0 in | Wt 188.0 lb

## 2020-07-02 DIAGNOSIS — I482 Chronic atrial fibrillation, unspecified: Secondary | ICD-10-CM | POA: Diagnosis not present

## 2020-07-02 DIAGNOSIS — I10 Essential (primary) hypertension: Secondary | ICD-10-CM | POA: Diagnosis not present

## 2020-07-02 DIAGNOSIS — I4891 Unspecified atrial fibrillation: Secondary | ICD-10-CM

## 2020-07-02 DIAGNOSIS — Z5181 Encounter for therapeutic drug level monitoring: Secondary | ICD-10-CM

## 2020-07-02 DIAGNOSIS — Z7189 Other specified counseling: Secondary | ICD-10-CM

## 2020-07-02 DIAGNOSIS — Z7901 Long term (current) use of anticoagulants: Secondary | ICD-10-CM

## 2020-07-02 DIAGNOSIS — E7849 Other hyperlipidemia: Secondary | ICD-10-CM | POA: Diagnosis not present

## 2020-07-02 DIAGNOSIS — I2510 Atherosclerotic heart disease of native coronary artery without angina pectoris: Secondary | ICD-10-CM

## 2020-07-02 LAB — POCT INR: INR: 3.4 — AB (ref 2.0–3.0)

## 2020-07-02 NOTE — Patient Instructions (Signed)
Medication Instructions:  Your physician recommends that you continue on your current medications as directed. Please refer to the Current Medication list given to you today.  *If you need a refill on your cardiac medications before your next appointment, please call your pharmacy*   Lab Work: Lipid and Liver at the time of your next Coumadin appointment.  You will need to be fasting (nothing to eat or drink after midnight except water and black coffee).  If you have labs (blood work) drawn today and your tests are completely normal, you will receive your results only by: [name] MyChart Message (if you have MyChart) OR . A paper copy in the mail If you have any lab test that is abnormal or we need to change your treatment, we will call you to review the results.   Testing/Procedures: None   Follow-Up: At [facility], you and your health needs are our priority.  As part of our continuing mission to provide you with exceptional heart care, we have created designated Provider Care Teams.  These Care Teams include your primary Cardiologist (physician) and Advanced Practice Providers (APPs -  Physician Assistants and Nurse Practitioners) who all work together to provide you with the care you need, when you need it.  We recommend signing up for the patient portal called "MyChart".  Sign up information is provided on this After Visit Summary.  MyChart is used to connect with patients for Virtual Visits (Telemedicine).  Patients are able to view lab/test results, encounter notes, upcoming appointments, etc.  Non-urgent messages can be sent to your provider as well.   To learn more about what you can do with MyChart, go to [email or web address].    Your next appointment:   12 month(s)  The format for your next appointment:   In Person  Provider:   You may see Sinclair Grooms, MD or one of the following Advanced Practice Providers on your designated Care Team:    Truitt Merle, NP  Cecilie Kicks, NP  Kathyrn Drown, NP    Other Instructions

## 2020-07-02 NOTE — Patient Instructions (Signed)
Description   Hold today's dose then start  taking  1.25 mg daily except  2.5 mg on Monday, Wednesday and Friday. Recheck INR in 3 weeks. Coumadin clinic # [phone]. Main # [phone].

## 2020-07-06 ENCOUNTER — Ambulatory Visit: Payer: Medicare Other | Admitting: Interventional Cardiology

## 2020-07-17 ENCOUNTER — Telehealth: Payer: Self-pay | Admitting: Family Medicine

## 2020-07-17 NOTE — Telephone Encounter (Signed)
Copied from [place] [id number]. Topic: Medicare AWV >> Jul 17, 2020 11:08 AM Cher Nakai R wrote: Reason for IOE:VOJJ message for patient to call back and schedule Medicare Annual Wellness Visit (AWV) either virtually or in office.  Last AWV 11/12/2017  Please schedule at anytime with [facility] Health Advisor.  If any questions, please contact me at [phone]

## 2020-07-18 NOTE — Progress Notes (Signed)
Subjective:   Larry Bautista is a 79 y.o. male who presents for Medicare Annual/Subsequent preventive examination.  I connected with Larry Bautista today by telephone and verified that I am speaking with the correct person using two identifiers. Location patient: home Location provider: work Persons participating in the virtual visit: patient, provider.   I discussed the limitations, risks, security and privacy concerns of performing an evaluation and management service by telephone and the availability of in person appointments. I also discussed with the patient that there may be a patient responsible charge related to this service. The patient expressed understanding and verbally consented to this telephonic visit.    Interactive audio and video telecommunications were attempted between this provider and patient, however failed, due to patient having technical difficulties OR patient did not have access to video capability.  We continued and completed visit with audio only.   Review of Systems    N/A  Cardiac Risk Factors include: advanced age (>69men, >16 women);dyslipidemia;male gender;hypertension     Objective:    There were no vitals filed for this visit. There is no height or weight on file to calculate BMI.  Advanced Directives 07/19/2020 04/26/2020 04/19/2020 08/22/2019 08/21/2019 10/18/2018 07/19/2018  Does Patient Have a Medical Advance Directive? Yes Yes Yes Yes No Yes Yes  Type of Paramedic of [place];Living will - [place];Living will Living will;Healthcare Power of Attorney - Living will;Healthcare Power of Attorney Living will  Does patient want to make changes to medical advance directive? - No - Patient declined No - Patient declined No - Guardian declined - No - Patient declined No - Patient declined  Copy of [place] in Chart? Yes - validated most recent copy scanned in chart (See row information) -  Yes - validated most recent copy scanned in chart (See row information) No - copy requested - Yes - validated most recent copy scanned in chart (See row information) -  Would patient like information on creating a medical advance directive? - - - No - Patient declined No - Patient declined - -    Current Medications (verified) Outpatient Encounter Medications as of 07/19/2020  Medication Sig  . acetaminophen (TYLENOL) 325 MG tablet Take 2 tablets (650 mg total) by mouth QID. (Patient taking differently: Take 325 mg by mouth at bedtime. )  . albuterol (PROAIR HFA) 108 (90 Base) MCG/ACT inhaler Inhale 1-2 puffs into the lungs every 6 (six) hours as needed for wheezing or shortness of breath.  . cetirizine (ZYRTEC) 10 MG tablet Take 10 mg by mouth daily.  . diphenhydramine-acetaminophen (TYLENOL PM) 25-500 MG TABS tablet Take 1 tablet by mouth at bedtime as needed.  [name] eplerenone (INSPRA) 25 MG tablet TAKE ONE (1) TABLET EACH DAY  . fluticasone (FLONASE) 50 MCG/ACT nasal spray USE 2 SPRAYS IN EACH NOSTRIL DAILY  . gabapentin (NEURONTIN) 300 MG capsule Take 300 mg by mouth every other day.   . metoprolol succinate (TOPROL-XL) 25 MG 24 hr tablet TAKE ONE (1) TABLET EACH DAY  . nitroGLYCERIN (NITROSTAT) 0.4 MG SL tablet Place 1 tablet (0.4 mg total) under the tongue every 5 (five) minutes as needed.  . pantoprazole (PROTONIX) 40 MG tablet Take 1 tablet (40 mg total) by mouth daily.  . psyllium (METAMUCIL) 58.6 % powder Take 1 packet by mouth daily.   . rosuvastatin (CRESTOR) 20 MG tablet TAKE ONE (1) TABLET EACH DAY  . tamsulosin (FLOMAX) 0.4 MG CAPS capsule Take 0.4  mg by mouth daily after supper.  . vitamin C (ASCORBIC ACID) 500 MG tablet Take 500 mg by mouth daily.  [name] warfarin (COUMADIN) 2.5 MG tablet Take as directed by the coumadin clinic  . warfarin (COUMADIN) 5 MG tablet Take as directed by the coumadin clinic  . Calcium 200 MG TABS Take by mouth. (Patient not taking: Reported on 07/19/2020)   . traMADol (ULTRAM) 50 MG tablet Take 1 tablet (50 mg total) by mouth every 6 (six) hours as needed for severe pain. (Patient not taking: Reported on 07/19/2020)   No facility-administered encounter medications on file as of 07/19/2020.    Allergies (verified) Celecoxib, Lisinopril, and Simvastatin   History: Past Medical History:  Diagnosis Date  . Allergy   . CHF (congestive heart failure) ([place])   . Chronic knee pain   . Coronary artery disease    with LAD DE stent 2004  . Dysrhythmia   . Heel spur, left   . Hyperlipidemia   . Hypertension   . Kidney stones   . Lower back pain    status post surgery for spondylolisthesis  . Migraine   . Persistent atrial fibrillation ([place])    longstanding persistent (since 05/2008)  . Plantar fasciitis    Past Surgical History:  Procedure Laterality Date  . BACK SURGERY     spondylolisthesis  . CARDIAC CATHETERIZATION  03/06/2003  . CORONARY ANGIOPLASTY WITH STENT PLACEMENT  04/2003   CYPHER stent implantation in the LAD  . ESOPHAGOGASTRODUODENOSCOPY (EGD) WITH PROPOFOL N/A 01/07/2018   Procedure: ESOPHAGOGASTRODUODENOSCOPY (EGD) WITH PROPOFOL;  Surgeon: Wonda Horner, MD;  Location[facility] ENDOSCOPY;  Service: Endoscopy;  Laterality: N/A;  . HAND SURGERY Right 2014   carpal tunnel  . INGUINAL HERNIA REPAIR Right 04/26/2020   Procedure: RIGHT INGUINAL HERNIA REPAIR WITH MESH;  Surgeon: Coralie Keens, MD;  Location: [place];  Service: General;  Laterality: Right;  . SKIN CANCER EXCISION  1980's   Face  . VASCULAR SURGERY     Vascular Stent   Family History  Problem Relation Age of Onset  . Heart attack Father   . Colon cancer Father   . Congestive Heart Failure Father   . Atrial fibrillation Brother   . Stroke Brother   . Bladder Cancer Brother   . Lung cancer Mother   . Atrial fibrillation Brother   . COPD Brother   . Congestive Heart Failure Brother   . Lung cancer Brother   . Migraines Daughter     Social History   Socioeconomic History  . Marital status: Married    Spouse name: Larry Bautista  . Number of children: 1  . Years of education: Not on file  . Highest education level: Bachelor's degree (e.g., BA, AB, BS)  Occupational History  . Occupation: retired  Tobacco Use  . Smoking status: Former Smoker    Packs/day: 2.00    Years: 35.00    Pack years: 70.00    Types: Cigarettes    Start date: 09/08/1954    Quit date: 11/06/1989    Years since quitting: 30.7  . Smokeless tobacco: Never Used  . Tobacco comment: Smoked from [phone] (35 years)  Vaping Use  . Vaping Use: Never used  Substance and Sexual Activity  . Alcohol use: Not Currently    Alcohol/week: 2.0 standard drinks    Types: 2 Standard drinks or equivalent per week    Comment: 1961-2009 (Last drink 2009)   . Drug use: No  .  Sexual activity: Not on file  Other Topics Concern  . Not on file  Social History Narrative   Retired Insurance underwriter. Married in 2015. Has one daughter, and 4 grandchildren. He lives in [place].   Social Determinants of Health   Financial Resource Strain: Low Risk   . Difficulty of Paying Living Expenses: Not hard at all  Food Insecurity: No Food Insecurity  . Worried About Charity fundraiser in the Last Year: Never true  . Ran Out of Food in the Last Year: Never true  Transportation Needs: No Transportation Needs  . Lack of Transportation (Medical): No  . Lack of Transportation (Non-Medical): No  Physical Activity: Inactive  . Days of Exercise per Week: 0 days  . Minutes of Exercise per Session: 0 min  Stress: No Stress Concern Present  . Feeling of Stress : Not at all  Social Connections: Moderately Integrated  . Frequency of Communication with Friends and Family: More than three times a week  . Frequency of Social Gatherings with Friends and Family: Once a week  . Attends Religious Services: Never  . Active Member of Clubs or Organizations: Yes  . Attends Theatre manager Meetings: More than 4 times per year  . Marital Status: Married    Tobacco Counseling Counseling given: Not Answered Comment: Smoked from [phone] (35 years)   Clinical Intake:  Pre-visit preparation completed: Yes  Pain : No/denies pain     Nutritional Risks: None Diabetes: No  How often do you need to have someone help you when you read instructions, pamphlets, or other written materials from your doctor or pharmacy?: 1 - Never  Diabetic? No  Interpreter Needed?: No  Information entered by :: [facility], LPN   Activities of Daily Living In your present state of health, do you have any difficulty performing the following activities: 07/19/2020 04/26/2020  Hearing? N N  Vision? N Y  Difficulty concentrating or making decisions? N N  Walking or climbing stairs? N N  Dressing or bathing? N N  Doing errands, shopping? N -  Preparing Food and eating ? N -  Using the Toilet? N -  In the past six months, have you accidently leaked urine? N -  Do you have problems with loss of bowel control? N -  Managing your Medications? N -  Managing your Finances? N -  Housekeeping or managing your Housekeeping? N -  Some recent data might be hidden    Patient Care Team: [name], MD as PCP - General (Family Medicine) Belva Crome, MD as PCP - Cardiology (Cardiology) Festus Aloe, MD as Consulting Physician (Urology) Verner Chol, MD as Consulting Physician (Sports Medicine) Wayne Both, MD (Oncology) Coralie Keens, MD as Consulting Physician (General Surgery) Ladell Pier, MD as Consulting Physician (Oncology) Kristeen Miss, MD as Consulting Physician (Neurosurgery) [facility]  Indicate any recent Medical Services you may have received from other than [facility] providers in the past year (date may be approximate).     Assessment:   This is a routine wellness examination for [place].  Hearing/Vision screen No  exam data present  Dietary issues and exercise activities discussed: Current Exercise Habits: The patient does not participate in regular exercise at present, Exercise limited by: None identified  Goals    . DIET - REDUCE PORTION SIZE     Recommend decreasing amount of portions sizes by half and eating 3 meals a day with 2 healthy snacks in between.     [name]  Increase water intake     Recommend to drink at least 6-8 8oz glasses of water per day.      Depression Screen PHQ 2/9 Scores 07/19/2020 09/16/2018 01/19/2018 11/12/2017 07/09/2016 05/13/2016 03/28/2015  PHQ - 2 Score 0 0 0 0 0 0 0    Fall Risk Fall Risk  07/19/2020 09/16/2018 01/19/2018 11/12/2017 07/09/2016  Falls in the past year? 1 0 No No No  Number falls in past yr: 1 - - - -  Injury with Fall? 0 - - - -  Follow up Falls prevention discussed - - - -    Any stairs in or around the home? Yes  If so, are there any without handrails? No  Home free of loose throw rugs in walkways, pet beds, electrical cords, etc? Yes  Adequate lighting in your home to reduce risk of falls? Yes   ASSISTIVE DEVICES UTILIZED TO PREVENT FALLS:  Life alert? Yes  Use of a cane, walker or w/c? Yes  Grab bars in the bathroom? Yes  Shower chair or bench in shower? No  Elevated toilet seat or a handicapped toilet? No    Cognitive Function: Declined today.      6CIT Screen 07/09/2016  What Year? 0 points  What month? 0 points  What time? 0 points  Count back from 20 0 points  Months in reverse 0 points  Repeat phrase 0 points  Total Score 0    Immunizations Immunization History  Administered Date(s) Administered  . Influenza, High Dose Seasonal PF 05/01/2016, 04/08/2017, 04/22/2018  . Influenza-Unspecified 06/04/2015, 05/11/2020  . Moderna SARS-COVID-2 Vaccination 12/07/2019, 01/04/2020, 07/09/2020  . Pneumococcal Conjugate-13 03/28/2015  . Pneumococcal Polysaccharide-23 02/08/2020  . Tdap 05/17/2012  . Zoster 11/04/2005  . Zoster Recombinat  (Shingrix) 06/21/2019, 01/11/2020    TDAP status: Up to date Flu Vaccine status: Up to date Pneumococcal vaccine status: Up to date Covid-19 vaccine status: Completed vaccines  Qualifies for Shingles Vaccine? Yes   Zostavax completed Yes   Shingrix Completed?: Yes  Screening Tests Health Maintenance  Topic Date Due  . TETANUS/TDAP  05/17/2022  . INFLUENZA VACCINE  Completed  . COVID-19 Vaccine  Completed  . PNA vac Low Risk Adult  Completed    Health Maintenance  There are no preventive care reminders to display for this patient.  Colorectal cancer screening: No longer required.   Lung Cancer Screening: (Low Dose CT Chest recommended if Age 44-80 years, 30 pack-year currently smoking OR have quit w/in 15years.) does not qualify.   Additional Screening:  Vision Screening: Recommended annual ophthalmology exams for early detection of glaucoma and other disorders of the eye. Is the patient up to date with their annual eye exam?  Yes  Who is the provider or what is the name of the office in which the patient attends annual eye exams? [facility]. If pt is not established with a provider, would they like to be referred to a provider to establish care? No .   Dental Screening: Recommended annual dental exams for proper oral hygiene  Community Resource Referral / Chronic Care Management: CRR required this visit?  No   CCM required this visit?  No      Plan:     I have personally reviewed and noted the following in the patient's chart:   . Medical and social history . Use of alcohol, tobacco or illicit drugs  . Current medications and supplements . Functional ability and status . Nutritional status . Physical activity .  Advanced directives . List of other physicians . Hospitalizations, surgeries, and ER visits in previous 12 months . Vitals . Screenings to include cognitive, depression, and falls . Referrals and appointments  In addition, I have reviewed and  discussed with patient certain preventive protocols, quality metrics, and best practice recommendations. A written personalized care plan for preventive services as well as general preventive health recommendations were provided to patient.     Kortne All [place], [place]   [id number]   Nurse Notes: None.

## 2020-07-19 ENCOUNTER — Other Ambulatory Visit: Payer: Self-pay

## 2020-07-19 ENCOUNTER — Ambulatory Visit (INDEPENDENT_AMBULATORY_CARE_PROVIDER_SITE_OTHER): Payer: Medicare Other

## 2020-07-19 DIAGNOSIS — Z0000 Encounter for general adult medical examination without abnormal findings: Secondary | ICD-10-CM | POA: Diagnosis not present

## 2020-07-19 NOTE — Patient Instructions (Signed)
Larry Bautista , Thank you for taking time to come for your Medicare Wellness Visit. I appreciate your ongoing commitment to your health goals. Please review the following plan we discussed and let me know if I can assist you in the future.   Screening recommendations/referrals: Colonoscopy: No longer required.  Recommended yearly ophthalmology/optometry visit for glaucoma screening and checkup Recommended yearly dental visit for hygiene and checkup  Vaccinations: Influenza vaccine: Done 05/11/20 Pneumococcal vaccine: Completed series Tdap vaccine: Up to date, due 05/2022 Shingles vaccine: Completed series    Advanced directives: Currently on file.  Conditions/risks identified: Recommend to increase water intake to 6-8 8 oz glasses a day. Also recommend to eat 3 small meals a day with 2 healthy snacks in between.   Next appointment: 07/25/21 @ 2:00 PM for an AWV. Declined scheduling a follow up with PCP right now.   Preventive Care 16 Years and Older, Male Preventive care refers to lifestyle choices and visits with your health care provider that can promote health and wellness. What does preventive care include?  A yearly physical exam. This is also called an annual well check.  Dental exams once or twice a year.  Routine eye exams. Ask your health care provider how often you should have your eyes checked.  Personal lifestyle choices, including:  Daily care of your teeth and gums.  Regular physical activity.  Eating a healthy diet.  Avoiding tobacco and drug use.  Limiting alcohol use.  Practicing safe sex.  Taking low doses of aspirin every day.  Taking vitamin and mineral supplements as recommended by your health care provider. What happens during an annual well check? The services and screenings done by your health care provider during your annual well check will depend on your age, overall health, lifestyle risk factors, and family history of disease. Counseling  Your  health care provider may ask you questions about your:  Alcohol use.  Tobacco use.  Drug use.  Emotional well-being.  Home and relationship well-being.  Sexual activity.  Eating habits.  History of falls.  Memory and ability to understand (cognition).  Work and work Statistician. Screening  You may have the following tests or measurements:  Height, weight, and BMI.  Blood pressure.  Lipid and cholesterol levels. These may be checked every 5 years, or more frequently if you are over 73 years old.  Skin check.  Lung cancer screening. You may have this screening every year starting at age 56 if you have a 30-pack-year history of smoking and currently smoke or have quit within the past 15 years.  Fecal occult blood test (FOBT) of the stool. You may have this test every year starting at age 62.  Flexible sigmoidoscopy or colonoscopy. You may have a sigmoidoscopy every 5 years or a colonoscopy every 10 years starting at age 75.  Prostate cancer screening. Recommendations will vary depending on your family history and other risks.  Hepatitis C blood test.  Hepatitis B blood test.  Sexually transmitted disease (STD) testing.  Diabetes screening. This is done by checking your blood sugar (glucose) after you have not eaten for a while (fasting). You may have this done every 1-3 years.  Abdominal aortic aneurysm (AAA) screening. You may need this if you are a current or former smoker.  Osteoporosis. You may be screened starting at age 22 if you are at high risk. Talk with your health care provider about your test results, treatment options, and if necessary, the need for more tests. Vaccines  Your health care provider may recommend certain vaccines, such as:  Influenza vaccine. This is recommended every year.  Tetanus, diphtheria, and acellular pertussis (Tdap, Td) vaccine. You may need a Td booster every 10 years.  Zoster vaccine. You may need this after age  74.  Pneumococcal 13-valent conjugate (PCV13) vaccine. One dose is recommended after age 19.  Pneumococcal polysaccharide (PPSV23) vaccine. One dose is recommended after age 2. Talk to your health care provider about which screenings and vaccines you need and how often you need them. This information is not intended to replace advice given to you by your health care provider. Make sure you discuss any questions you have with your health care provider. Document Released: 09/21/2015 Document Revised: 05/14/2016 Document Reviewed: 06/26/2015 Elsevier Interactive Patient Education  2017 [place] Prevention in the Home Falls can cause injuries. They can happen to people of all ages. There are many things you can do to make your home safe and to help prevent falls. What can I do on the outside of my home?  Regularly fix the edges of walkways and driveways and fix any cracks.  Remove anything that might make you trip as you walk through a door, such as a raised step or threshold.  Trim any bushes or trees on the path to your home.  Use bright outdoor lighting.  Clear any walking paths of anything that might make someone trip, such as rocks or tools.  Regularly check to see if handrails are loose or broken. Make sure that both sides of any steps have handrails.  Any raised decks and porches should have guardrails on the edges.  Have any leaves, snow, or ice cleared regularly.  Use sand or salt on walking paths during winter.  Clean up any spills in your garage right away. This includes oil or grease spills. What can I do in the bathroom?  Use night lights.  Install grab bars by the toilet and in the tub and shower. Do not use towel bars as grab bars.  Use non-skid mats or decals in the tub or shower.  If you need to sit down in the shower, use a plastic, non-slip stool.  Keep the floor dry. Clean up any water that spills on the floor as soon as it happens.  Remove  soap buildup in the tub or shower regularly.  Attach bath mats securely with double-sided non-slip rug tape.  Do not have throw rugs and other things on the floor that can make you trip. What can I do in the bedroom?  Use night lights.  Make sure that you have a light by your bed that is easy to reach.  Do not use any sheets or blankets that are too big for your bed. They should not hang down onto the floor.  Have a firm chair that has side arms. You can use this for support while you get dressed.  Do not have throw rugs and other things on the floor that can make you trip. What can I do in the kitchen?  Clean up any spills right away.  Avoid walking on wet floors.  Keep items that you use a lot in easy-to-reach places.  If you need to reach something above you, use a strong step stool that has a grab bar.  Keep electrical cords out of the way.  Do not use floor polish or wax that makes floors slippery. If you must use wax, use non-skid floor wax.  Do  not have throw rugs and other things on the floor that can make you trip. What can I do with my stairs?  Do not leave any items on the stairs.  Make sure that there are handrails on both sides of the stairs and use them. Fix handrails that are broken or loose. Make sure that handrails are as long as the stairways.  Check any carpeting to make sure that it is firmly attached to the stairs. Fix any carpet that is loose or worn.  Avoid having throw rugs at the top or bottom of the stairs. If you do have throw rugs, attach them to the floor with carpet tape.  Make sure that you have a light switch at the top of the stairs and the bottom of the stairs. If you do not have them, ask someone to add them for you. What else can I do to help prevent falls?  Wear shoes that:  Do not have high heels.  Have rubber bottoms.  Are comfortable and fit you well.  Are closed at the toe. Do not wear sandals.  If you use a  stepladder:  Make sure that it is fully opened. Do not climb a closed stepladder.  Make sure that both sides of the stepladder are locked into place.  Ask someone to hold it for you, if possible.  Clearly mark and make sure that you can see:  Any grab bars or handrails.  First and last steps.  Where the edge of each step is.  Use tools that help you move around (mobility aids) if they are needed. These include:  Canes.  Walkers.  Scooters.  Crutches.  Turn on the lights when you go into a dark area. Replace any light bulbs as soon as they burn out.  Set up your furniture so you have a clear path. Avoid moving your furniture around.  If any of your floors are uneven, fix them.  If there are any pets around you, be aware of where they are.  Review your medicines with your doctor. Some medicines can make you feel dizzy. This can increase your chance of falling. Ask your doctor what other things that you can do to help prevent falls. This information is not intended to replace advice given to you by your health care provider. Make sure you discuss any questions you have with your health care provider. Document Released: 06/21/2009 Document Revised: 01/31/2016 Document Reviewed: 09/29/2014 Elsevier Interactive Patient Education  2017 [employer].

## 2020-07-24 ENCOUNTER — Other Ambulatory Visit: Payer: Medicare Other

## 2020-07-24 ENCOUNTER — Ambulatory Visit (INDEPENDENT_AMBULATORY_CARE_PROVIDER_SITE_OTHER): Payer: Medicare Other | Admitting: *Deleted

## 2020-07-24 ENCOUNTER — Other Ambulatory Visit: Payer: Self-pay

## 2020-07-24 DIAGNOSIS — Z5181 Encounter for therapeutic drug level monitoring: Secondary | ICD-10-CM | POA: Diagnosis not present

## 2020-07-24 DIAGNOSIS — I2510 Atherosclerotic heart disease of native coronary artery without angina pectoris: Secondary | ICD-10-CM

## 2020-07-24 DIAGNOSIS — I4891 Unspecified atrial fibrillation: Secondary | ICD-10-CM

## 2020-07-24 LAB — POCT INR: INR: 3 (ref 2.0–3.0)

## 2020-07-24 NOTE — Patient Instructions (Addendum)
Description    Continue taking  1.25 mg daily except  2.5 mg on Monday, Wednesday and Friday. Recheck INR in 4 weeks. Coumadin clinic # [phone]. Main # [phone].

## 2020-07-25 LAB — HEPATIC FUNCTION PANEL
ALT: 24 IU/L (ref 0–44)
AST: 32 IU/L (ref 0–40)
Albumin: 4.4 g/dL (ref 3.7–4.7)
Alkaline Phosphatase: 53 IU/L (ref 44–121)
Bilirubin Total: 0.5 mg/dL (ref 0.0–1.2)
Bilirubin, Direct: 0.19 mg/dL (ref 0.00–0.40)
Total Protein: 6.4 g/dL (ref 6.0–8.5)

## 2020-07-25 LAB — LIPID PANEL
Chol/HDL Ratio: 2.6 ratio (ref 0.0–5.0)
Cholesterol, Total: 128 mg/dL (ref 100–199)
HDL: 49 mg/dL (ref >39)
LDL Chol Calc (NIH): 60 mg/dL (ref 0–99)
Triglycerides: 101 mg/dL (ref 0–149)
VLDL Cholesterol Cal: 19 mg/dL (ref 5–40)

## 2020-08-06 DIAGNOSIS — M25511 Pain in right shoulder: Secondary | ICD-10-CM | POA: Diagnosis not present

## 2020-08-06 DIAGNOSIS — M19011 Primary osteoarthritis, right shoulder: Secondary | ICD-10-CM | POA: Diagnosis not present

## 2020-08-21 ENCOUNTER — Other Ambulatory Visit: Payer: Self-pay

## 2020-08-21 ENCOUNTER — Ambulatory Visit (INDEPENDENT_AMBULATORY_CARE_PROVIDER_SITE_OTHER): Payer: Medicare Other | Admitting: *Deleted

## 2020-08-21 DIAGNOSIS — Z5181 Encounter for therapeutic drug level monitoring: Secondary | ICD-10-CM | POA: Diagnosis not present

## 2020-08-21 DIAGNOSIS — I480 Paroxysmal atrial fibrillation: Secondary | ICD-10-CM | POA: Diagnosis not present

## 2020-08-21 DIAGNOSIS — I4891 Unspecified atrial fibrillation: Secondary | ICD-10-CM

## 2020-08-21 LAB — POCT INR: INR: 2.7 (ref 2.0–3.0)

## 2020-08-21 NOTE — Patient Instructions (Signed)
Description   Continue taking Warfarin 1.25 mg daily except  2.5 mg on Monday, Wednesday and Friday. Recheck INR in 5 weeks. Coumadin clinic # [phone]. Main # [phone].

## 2020-09-05 DIAGNOSIS — N200 Calculus of kidney: Secondary | ICD-10-CM | POA: Diagnosis not present

## 2020-09-05 DIAGNOSIS — R319 Hematuria, unspecified: Secondary | ICD-10-CM | POA: Diagnosis not present

## 2020-09-05 DIAGNOSIS — N400 Enlarged prostate without lower urinary tract symptoms: Secondary | ICD-10-CM | POA: Diagnosis not present

## 2020-09-27 ENCOUNTER — Other Ambulatory Visit: Payer: Self-pay

## 2020-09-27 ENCOUNTER — Ambulatory Visit (INDEPENDENT_AMBULATORY_CARE_PROVIDER_SITE_OTHER): Payer: Medicare Other | Admitting: *Deleted

## 2020-09-27 DIAGNOSIS — Z5181 Encounter for therapeutic drug level monitoring: Secondary | ICD-10-CM

## 2020-09-27 DIAGNOSIS — I4891 Unspecified atrial fibrillation: Secondary | ICD-10-CM | POA: Diagnosis not present

## 2020-09-27 LAB — POCT INR: INR: 2.8 (ref 2.0–3.0)

## 2020-09-27 NOTE — Patient Instructions (Signed)
Description   Continue taking Warfarin 1.25 mg daily except  2.5 mg on Monday, Wednesday and Friday. Recheck INR in 6 weeks. Coumadin clinic # [phone]. Main # [phone].

## 2020-11-05 ENCOUNTER — Other Ambulatory Visit: Payer: Self-pay | Admitting: Family Medicine

## 2020-11-05 DIAGNOSIS — M1711 Unilateral primary osteoarthritis, right knee: Secondary | ICD-10-CM | POA: Diagnosis not present

## 2020-11-05 DIAGNOSIS — M19011 Primary osteoarthritis, right shoulder: Secondary | ICD-10-CM | POA: Diagnosis not present

## 2020-11-08 ENCOUNTER — Ambulatory Visit (INDEPENDENT_AMBULATORY_CARE_PROVIDER_SITE_OTHER): Payer: Medicare Other

## 2020-11-08 ENCOUNTER — Other Ambulatory Visit: Payer: Self-pay

## 2020-11-08 DIAGNOSIS — Z5181 Encounter for therapeutic drug level monitoring: Secondary | ICD-10-CM | POA: Diagnosis not present

## 2020-11-08 DIAGNOSIS — I480 Paroxysmal atrial fibrillation: Secondary | ICD-10-CM | POA: Diagnosis not present

## 2020-11-08 DIAGNOSIS — I4891 Unspecified atrial fibrillation: Secondary | ICD-10-CM

## 2020-11-08 LAB — POCT INR: INR: 2.3 (ref 2.0–3.0)

## 2020-11-08 NOTE — Patient Instructions (Signed)
Description   Continue taking Warfarin 1.25 mg daily except  2.5 mg on Mondays, Wednesdays and Fridays. Recheck INR in 6 weeks. Coumadin clinic # [phone]. Main # [phone].    

## 2020-12-18 ENCOUNTER — Telehealth: Payer: Self-pay | Admitting: Oncology

## 2020-12-18 ENCOUNTER — Other Ambulatory Visit: Payer: Self-pay

## 2020-12-18 ENCOUNTER — Inpatient Hospital Stay: Payer: Medicare Other | Attending: Oncology | Admitting: Oncology

## 2020-12-18 ENCOUNTER — Other Ambulatory Visit: Payer: Self-pay | Admitting: Interventional Cardiology

## 2020-12-18 VITALS — BP 124/80 | HR 99 | Temp 98.7°F | Resp 18 | Ht 67.0 in | Wt 187.0 lb

## 2020-12-18 DIAGNOSIS — R609 Edema, unspecified: Secondary | ICD-10-CM | POA: Insufficient documentation

## 2020-12-18 DIAGNOSIS — Z923 Personal history of irradiation: Secondary | ICD-10-CM | POA: Diagnosis not present

## 2020-12-18 DIAGNOSIS — Z87891 Personal history of nicotine dependence: Secondary | ICD-10-CM | POA: Diagnosis not present

## 2020-12-18 DIAGNOSIS — I4891 Unspecified atrial fibrillation: Secondary | ICD-10-CM | POA: Diagnosis not present

## 2020-12-18 DIAGNOSIS — C160 Malignant neoplasm of cardia: Secondary | ICD-10-CM | POA: Diagnosis not present

## 2020-12-18 DIAGNOSIS — T451X5A Adverse effect of antineoplastic and immunosuppressive drugs, initial encounter: Secondary | ICD-10-CM | POA: Diagnosis not present

## 2020-12-18 DIAGNOSIS — I2510 Atherosclerotic heart disease of native coronary artery without angina pectoris: Secondary | ICD-10-CM | POA: Diagnosis not present

## 2020-12-18 DIAGNOSIS — J449 Chronic obstructive pulmonary disease, unspecified: Secondary | ICD-10-CM | POA: Diagnosis not present

## 2020-12-18 DIAGNOSIS — Z79899 Other long term (current) drug therapy: Secondary | ICD-10-CM | POA: Insufficient documentation

## 2020-12-18 DIAGNOSIS — Z87442 Personal history of urinary calculi: Secondary | ICD-10-CM | POA: Insufficient documentation

## 2020-12-18 DIAGNOSIS — K208 Other esophagitis without bleeding: Secondary | ICD-10-CM

## 2020-12-18 DIAGNOSIS — R1310 Dysphagia, unspecified: Secondary | ICD-10-CM | POA: Insufficient documentation

## 2020-12-18 DIAGNOSIS — R200 Anesthesia of skin: Secondary | ICD-10-CM | POA: Diagnosis not present

## 2020-12-18 DIAGNOSIS — Z7901 Long term (current) use of anticoagulants: Secondary | ICD-10-CM | POA: Insufficient documentation

## 2020-12-18 NOTE — Progress Notes (Signed)
  [place] OFFICE PROGRESS NOTE   Diagnosis: Gastroesophageal cancer  INTERVAL HISTORY:   [name] returns as scheduled.  No dysphagia.  No bleeding.  Good appetite.  He has intermittent "stinging "in the left foot.  This has been a chronic symptom.  He reports mild numbness in the left fingers since completion of chemotherapy.  He has difficulty with balance when he first stands up.  Objective:  Vital signs in last 24 hours:  Blood pressure 124/80, pulse 99, temperature 98.7 F (37.1 C), temperature source Tympanic, resp. rate 18, height 5\' 7"  (1.702 m), weight 187 lb (84.8 kg), SpO2 100 %.    Lymphatics: No cervical, supraclavicular, axillary, or inguinal nodes.  Firm fullness in the medial right axilla-cyst?  No discrete lymph node. Resp: End inspiratory rhonchi at the posterior base bilaterally.  No respiratory distress Cardio: Irregular GI: No hepatosplenomegaly, nontender Vascular: Trace pitting edema at the left greater than right lower leg. Neuro: Finger-to-nose testing is normal     Lab Results:  Lab Results  Component Value Date   WBC 10.8 (H) 08/25/2019   HGB 14.3 08/25/2019   HCT 43.3 08/25/2019   MCV 97.7 08/25/2019   PLT 231 08/25/2019   NEUTROABS 15.0 (H) 08/21/2019    CMP  Lab Results  Component Value Date   NA 140 04/25/2020   K 5.0 04/25/2020   CL 105 04/25/2020   CO2 27 04/25/2020   GLUCOSE 93 04/25/2020   BUN 12 04/25/2020   CREATININE 0.73 04/25/2020   CALCIUM 9.5 04/25/2020   PROT 6.4 07/24/2020   ALBUMIN 4.4 07/24/2020   AST 32 07/24/2020   ALT 24 07/24/2020   ALKPHOS 53 07/24/2020   BILITOT 0.5 07/24/2020   GFRNONAA >60 04/25/2020   GFRAA >60 04/25/2020     Medications: I have reviewed the patient's current medications.   Assessment/Plan: 1. Adenocarcinoma of the GE junction ? Partially obstructing mass noted at the GE junction on endoscopy 01/07/2018, nonobstructing ? CTs 01/14/2018, sub-solid 5 mm right  upper lobe nodule, mild eccentric wall thickening at the GE junction ? PET scan 01/27/2018-hypermetabolism at the GE junction, no evidence of metastatic disease ? Initiation of radiation and weekly Taxol/carboplatin 02/03/2018 ? Week 2 Taxol/carboplatin 02/10/2018 ? Week 3 Taxol/carboplatin 02/17/2018 ? Week 4 Taxol/carboplatin 02/24/2018 ? Week 5 Taxol/carboplatin 03/03/2018 ? Radiation completed 03/17/2018 ? CTs 05/06/2018- unchanged right upper lobe nodule, esophagus appears normal 2. History of GI bleeding secondary to #1 3. Atrial fibrillation-maintained on Coumadin 4. Coronary artery disease 5. Chronic exertional dyspnea-likely COPD 6. History of tobacco use 7. History of kidney stones 8. Focus of hypermetabolism at the right prostate on the PET scan 01/27/2018, PSA normal on 02/10/2018 9. History of thrombocytopenia secondary to chemotherapy 10. Odynophagia secondary to chemotherapy/radiation-resolved 11. Rash-most likely secondary to radiation, though the rash was in an atypical distribution;  resolved     Disposition: [name] is in clinical remission from gastroesophageal cancer.  He will call for bleeding or dysphagia.  He will return for an office visit in 6 months.  Betsy Coder, MD  12/18/2020  11:42 AM

## 2020-12-18 NOTE — Telephone Encounter (Signed)
Scheduled appt per 4/12 los - gave patient AVS and calender per los.

## 2020-12-20 ENCOUNTER — Other Ambulatory Visit: Payer: Self-pay

## 2020-12-20 ENCOUNTER — Ambulatory Visit (INDEPENDENT_AMBULATORY_CARE_PROVIDER_SITE_OTHER): Payer: Medicare Other | Admitting: Pharmacist

## 2020-12-20 DIAGNOSIS — Z5181 Encounter for therapeutic drug level monitoring: Secondary | ICD-10-CM | POA: Diagnosis not present

## 2020-12-20 DIAGNOSIS — I4891 Unspecified atrial fibrillation: Secondary | ICD-10-CM | POA: Diagnosis not present

## 2020-12-20 DIAGNOSIS — I480 Paroxysmal atrial fibrillation: Secondary | ICD-10-CM | POA: Diagnosis not present

## 2020-12-20 LAB — POCT INR: INR: 2.3 (ref 2.0–3.0)

## 2020-12-20 NOTE — Patient Instructions (Signed)
Description   Continue taking Warfarin 1.25 mg daily except  2.5 mg on Mondays, Wednesdays and Fridays. Recheck INR in 6 weeks. Coumadin clinic # [phone]. Main # [phone].

## 2021-01-10 ENCOUNTER — Other Ambulatory Visit: Payer: Self-pay | Admitting: Interventional Cardiology

## 2021-01-22 DIAGNOSIS — M25511 Pain in right shoulder: Secondary | ICD-10-CM | POA: Diagnosis not present

## 2021-01-29 ENCOUNTER — Other Ambulatory Visit: Payer: Self-pay | Admitting: Interventional Cardiology

## 2021-01-30 ENCOUNTER — Ambulatory Visit (INDEPENDENT_AMBULATORY_CARE_PROVIDER_SITE_OTHER): Payer: Medicare Other | Admitting: *Deleted

## 2021-01-30 ENCOUNTER — Other Ambulatory Visit: Payer: Self-pay

## 2021-01-30 DIAGNOSIS — I480 Paroxysmal atrial fibrillation: Secondary | ICD-10-CM

## 2021-01-30 DIAGNOSIS — Z5181 Encounter for therapeutic drug level monitoring: Secondary | ICD-10-CM

## 2021-01-30 DIAGNOSIS — I4891 Unspecified atrial fibrillation: Secondary | ICD-10-CM | POA: Diagnosis not present

## 2021-01-30 LAB — POCT INR: INR: 2.2 (ref 2.0–3.0)

## 2021-01-30 NOTE — Patient Instructions (Signed)
Description   Continue taking Warfarin 1.25 mg daily except  2.5 mg on Mondays, Wednesdays and Fridays. Recheck INR in 6 weeks. Call with any new medications or procedure/surgery dates. [facility] # [phone]. Main # [phone].

## 2021-02-07 ENCOUNTER — Other Ambulatory Visit: Payer: Self-pay | Admitting: Oncology

## 2021-02-15 DIAGNOSIS — N401 Enlarged prostate with lower urinary tract symptoms: Secondary | ICD-10-CM | POA: Diagnosis not present

## 2021-02-15 DIAGNOSIS — R3911 Hesitancy of micturition: Secondary | ICD-10-CM | POA: Diagnosis not present

## 2021-02-15 DIAGNOSIS — N200 Calculus of kidney: Secondary | ICD-10-CM | POA: Diagnosis not present

## 2021-02-19 DIAGNOSIS — M25511 Pain in right shoulder: Secondary | ICD-10-CM | POA: Diagnosis not present

## 2021-03-12 ENCOUNTER — Other Ambulatory Visit: Payer: Self-pay

## 2021-03-12 ENCOUNTER — Ambulatory Visit (INDEPENDENT_AMBULATORY_CARE_PROVIDER_SITE_OTHER): Payer: Medicare Other | Admitting: Pharmacist

## 2021-03-12 DIAGNOSIS — I4891 Unspecified atrial fibrillation: Secondary | ICD-10-CM | POA: Diagnosis not present

## 2021-03-12 DIAGNOSIS — I480 Paroxysmal atrial fibrillation: Secondary | ICD-10-CM | POA: Diagnosis not present

## 2021-03-12 DIAGNOSIS — Z5181 Encounter for therapeutic drug level monitoring: Secondary | ICD-10-CM

## 2021-03-12 LAB — POCT INR: INR: 1.7 — AB (ref 2.0–3.0)

## 2021-03-12 NOTE — Patient Instructions (Signed)
Description   Take 2.5mg  of warfarin today, then continue taking warfarin 1.25 mg daily except  2.5 mg on Mondays, Wednesdays and Fridays. Recheck INR in 4 weeks. Call with any new medications or procedure/surgery dates. [facility] # [phone]. Main # [phone].

## 2021-04-09 ENCOUNTER — Other Ambulatory Visit: Payer: Self-pay

## 2021-04-09 ENCOUNTER — Ambulatory Visit (INDEPENDENT_AMBULATORY_CARE_PROVIDER_SITE_OTHER): Payer: Medicare Other | Admitting: *Deleted

## 2021-04-09 DIAGNOSIS — Z5181 Encounter for therapeutic drug level monitoring: Secondary | ICD-10-CM

## 2021-04-09 DIAGNOSIS — I4891 Unspecified atrial fibrillation: Secondary | ICD-10-CM | POA: Diagnosis not present

## 2021-04-09 LAB — POCT INR: INR: 2 (ref 2.0–3.0)

## 2021-04-09 NOTE — Patient Instructions (Signed)
Description   Take 2.'5mg'$  of warfarin today, then continue taking warfarin 1.25 mg daily except  2.5 mg on Mondays, Wednesdays and Fridays. Recheck INR in 5 weeks. Call with any new medications or procedure/surgery dates. [facility] # [phone]. Main # [phone].

## 2021-04-30 ENCOUNTER — Other Ambulatory Visit: Payer: Self-pay | Admitting: Interventional Cardiology

## 2021-05-14 ENCOUNTER — Ambulatory Visit (INDEPENDENT_AMBULATORY_CARE_PROVIDER_SITE_OTHER): Payer: Medicare Other | Admitting: *Deleted

## 2021-05-14 ENCOUNTER — Other Ambulatory Visit: Payer: Self-pay

## 2021-05-14 DIAGNOSIS — Z5181 Encounter for therapeutic drug level monitoring: Secondary | ICD-10-CM

## 2021-05-14 DIAGNOSIS — I480 Paroxysmal atrial fibrillation: Secondary | ICD-10-CM | POA: Diagnosis not present

## 2021-05-14 DIAGNOSIS — I4891 Unspecified atrial fibrillation: Secondary | ICD-10-CM | POA: Diagnosis not present

## 2021-05-14 LAB — POCT INR: INR: 2.2 (ref 2.0–3.0)

## 2021-05-14 NOTE — Patient Instructions (Signed)
Description   Continue taking warfarin 1.25 mg daily except  2.5 mg on Mondays, Wednesdays and Fridays. Recheck INR in 6 weeks. Call with any new medications or procedure/surgery dates. [facility] # [phone]. Main # [phone]. Fax number for shoulder procedure clearance [phone].

## 2021-05-30 DIAGNOSIS — Z23 Encounter for immunization: Secondary | ICD-10-CM | POA: Diagnosis not present

## 2021-06-05 ENCOUNTER — Other Ambulatory Visit: Payer: Self-pay

## 2021-06-05 ENCOUNTER — Ambulatory Visit (INDEPENDENT_AMBULATORY_CARE_PROVIDER_SITE_OTHER): Payer: Medicare Other | Admitting: Dermatology

## 2021-06-05 ENCOUNTER — Encounter: Payer: Self-pay | Admitting: Dermatology

## 2021-06-05 DIAGNOSIS — Z1283 Encounter for screening for malignant neoplasm of skin: Secondary | ICD-10-CM

## 2021-06-05 DIAGNOSIS — L578 Other skin changes due to chronic exposure to nonionizing radiation: Secondary | ICD-10-CM

## 2021-06-05 DIAGNOSIS — D1800 Hemangioma unspecified site: Secondary | ICD-10-CM | POA: Diagnosis not present

## 2021-06-05 DIAGNOSIS — L821 Other seborrheic keratosis: Secondary | ICD-10-CM

## 2021-06-05 DIAGNOSIS — Z85828 Personal history of other malignant neoplasm of skin: Secondary | ICD-10-CM

## 2021-06-05 DIAGNOSIS — Z86018 Personal history of other benign neoplasm: Secondary | ICD-10-CM | POA: Diagnosis not present

## 2021-06-05 DIAGNOSIS — L814 Other melanin hyperpigmentation: Secondary | ICD-10-CM | POA: Diagnosis not present

## 2021-06-05 DIAGNOSIS — D229 Melanocytic nevi, unspecified: Secondary | ICD-10-CM | POA: Diagnosis not present

## 2021-06-05 DIAGNOSIS — L820 Inflamed seborrheic keratosis: Secondary | ICD-10-CM | POA: Diagnosis not present

## 2021-06-05 NOTE — Progress Notes (Signed)
Follow-Up Visit   Subjective  Larry Bautista is a 80 y.o. male who presents for the following: Annual Exam (History of BCC of left face treated elsewhere (1990) - TBSE today). The patient presents for Total-Body Skin Exam (TBSE) for skin cancer screening and mole check.  The following portions of the chart were reviewed this encounter and updated as appropriate:   Tobacco  Allergies  Meds  Problems  Med Hx  Surg Hx  Fam Hx     Review of Systems:  No other skin or systemic complaints except as noted in HPI or Assessment and Plan.  Objective  Well appearing patient in no apparent distress; mood and affect are within normal limits.  A full examination was performed including scalp, head, eyes, ears, nose, lips, neck, chest, axillae, abdomen, back, buttocks, bilateral upper extremities, bilateral lower extremities, hands, feet, fingers, toes, fingernails, and toenails. All findings within normal limits unless otherwise noted below.  Face x 21, right elbow x 1 - total 22 (22) Erythematous keratotic or waxy stuck-on papule or plaque.    Assessment & Plan   History of Basal Cell Carcinoma of the Skin - No evidence of recurrence today - Recommend regular full body skin exams - Recommend daily broad spectrum sunscreen SPF 30+ to sun-exposed areas, reapply every 2 hours as needed.  - Call if any new or changing lesions are noted between office visits  History of Dysplastic Nevi - No evidence of recurrence today - Recommend regular full body skin exams - Recommend daily broad spectrum sunscreen SPF 30+ to sun-exposed areas, reapply every 2 hours as needed.  - Call if any new or changing lesions are noted between office visits  Lentigines - Scattered tan macules - Due to sun exposure - Benign-appearing, observe - Recommend daily broad spectrum sunscreen SPF 30+ to sun-exposed areas, reapply every 2 hours as needed. - Call for any changes  Seborrheic Keratoses - Stuck-on,  waxy, tan-brown papules and/or plaques  - Benign-appearing - Discussed benign etiology and prognosis. - Observe - Call for any changes  Melanocytic Nevi - Tan-brown and/or pink-flesh-colored symmetric macules and papules - Benign appearing on exam today - Observation - Call clinic for new or changing moles - Recommend daily use of broad spectrum spf 30+ sunscreen to sun-exposed areas.   Hemangiomas - Red papules - Discussed benign nature - Observe - Call for any changes  Actinic Damage - Chronic condition, secondary to cumulative UV/sun exposure - diffuse scaly erythematous macules with underlying dyspigmentation - Recommend daily broad spectrum sunscreen SPF 30+ to sun-exposed areas, reapply every 2 hours as needed.  - Staying in the shade or wearing long sleeves, sun glasses (UVA+UVB protection) and wide brim hats (4-inch brim around the entire circumference of the hat) are also recommended for sun protection.  - Call for new or changing lesions.  Skin cancer screening performed today.  Inflamed seborrheic keratosis Face x 21, right elbow x 1 - total 22  Destruction of lesion - Face x 21, right elbow x 1 - total 22 Complexity: simple   Destruction method: cryotherapy   Informed consent: discussed and consent obtained   Timeout:  patient name, date of birth, surgical site, and procedure verified Lesion destroyed using liquid nitrogen: Yes   Region frozen until ice ball extended beyond lesion: Yes   Outcome: patient tolerated procedure well with no complications   Post-procedure details: wound care instructions given    Skin cancer screening  Return in about 6 months (  around 12/03/2021) for Follow up.  I, Ashok Cordia, CMA, am acting as scribe for Sarina Ser, MD . Documentation: I have reviewed the above documentation for accuracy and completeness, and I agree with the above.  Sarina Ser, MD

## 2021-06-05 NOTE — Patient Instructions (Signed)

## 2021-06-07 ENCOUNTER — Encounter: Payer: Self-pay | Admitting: Dermatology

## 2021-06-13 ENCOUNTER — Ambulatory Visit: Payer: Self-pay | Admitting: *Deleted

## 2021-06-13 NOTE — Telephone Encounter (Signed)
Patient is calling to report he has possible insect bites on his ankle after working out in the yard 2 weeks ago. Patient states he has swelling , redness and pain with skin flaking. Patient advised he needs evaluation of his ankle- may not be insect related.

## 2021-06-13 NOTE — Telephone Encounter (Signed)
Reason for Disposition  [1] After 14 days AND [2] insect bite isn't healed  Answer Assessment - Initial Assessment Questions 1. TYPE of INSECT: "What type of insect was it?"      Not sure 2. ONSET: "When did you get bitten?"      2 weeks ago 3. LOCATION: "Where is the insect bite located?"      Left ankle 4. REDNESS: "Is the area red or pink?" If Yes, ask: "What size is area of redness?" (inches or cm). "When did the redness start?"     Red and skin flaking 5. PAIN: "Is there any pain?" If Yes, ask: "How bad is it?"  (Scale 1-10; or mild, moderate, severe)     Painful to touch- moderate 6. ITCHING: "Does it itch?" If Yes, ask: "How bad is the itch?"    - MILD: doesn't interfere with normal activities   - MODERATE-SEVERE: interferes with work, school, sleep, or other activities      No itching 7. SWELLING: "How big is the swelling?" (inches, cm, or compare to coins)     Yes- localized to ankle bone 8. OTHER SYMPTOMS: "Do you have any other symptoms?"  (e.g., difficulty breathing, hives)     no 9. PREGNANCY: "Is there any chance you are pregnant?" "When was your last menstrual period?"     N/a  Protocols used: Insect Bite-A-AH

## 2021-06-19 DIAGNOSIS — R42 Dizziness and giddiness: Secondary | ICD-10-CM | POA: Diagnosis not present

## 2021-06-21 ENCOUNTER — Inpatient Hospital Stay: Payer: Medicare Other | Admitting: Oncology

## 2021-06-24 ENCOUNTER — Emergency Department (HOSPITAL_COMMUNITY)
Admission: EM | Admit: 2021-06-24 | Discharge: 2021-06-25 | Disposition: A | Discharge status: Home / Self Care | Payer: Medicare Other | Attending: Emergency Medicine | Admitting: Emergency Medicine

## 2021-06-24 DIAGNOSIS — R519 Headache, unspecified: Secondary | ICD-10-CM | POA: Diagnosis not present

## 2021-06-24 DIAGNOSIS — J449 Chronic obstructive pulmonary disease, unspecified: Secondary | ICD-10-CM | POA: Diagnosis not present

## 2021-06-24 DIAGNOSIS — M542 Cervicalgia: Secondary | ICD-10-CM | POA: Insufficient documentation

## 2021-06-24 DIAGNOSIS — Z955 Presence of coronary angioplasty implant and graft: Secondary | ICD-10-CM | POA: Insufficient documentation

## 2021-06-24 DIAGNOSIS — I4891 Unspecified atrial fibrillation: Secondary | ICD-10-CM | POA: Diagnosis not present

## 2021-06-24 DIAGNOSIS — Z049 Encounter for examination and observation for unspecified reason: Secondary | ICD-10-CM | POA: Diagnosis not present

## 2021-06-24 DIAGNOSIS — Z7901 Long term (current) use of anticoagulants: Secondary | ICD-10-CM | POA: Insufficient documentation

## 2021-06-24 DIAGNOSIS — Z87891 Personal history of nicotine dependence: Secondary | ICD-10-CM | POA: Diagnosis not present

## 2021-06-24 DIAGNOSIS — R29818 Other symptoms and signs involving the nervous system: Secondary | ICD-10-CM | POA: Diagnosis not present

## 2021-06-24 DIAGNOSIS — I2510 Atherosclerotic heart disease of native coronary artery without angina pectoris: Secondary | ICD-10-CM | POA: Diagnosis not present

## 2021-06-24 DIAGNOSIS — I509 Heart failure, unspecified: Secondary | ICD-10-CM | POA: Insufficient documentation

## 2021-06-24 DIAGNOSIS — Z79899 Other long term (current) drug therapy: Secondary | ICD-10-CM | POA: Diagnosis not present

## 2021-06-24 DIAGNOSIS — Z85828 Personal history of other malignant neoplasm of skin: Secondary | ICD-10-CM | POA: Diagnosis not present

## 2021-06-24 DIAGNOSIS — I6523 Occlusion and stenosis of bilateral carotid arteries: Secondary | ICD-10-CM | POA: Diagnosis not present

## 2021-06-24 DIAGNOSIS — I672 Cerebral atherosclerosis: Secondary | ICD-10-CM | POA: Diagnosis not present

## 2021-06-24 DIAGNOSIS — I771 Stricture of artery: Secondary | ICD-10-CM | POA: Diagnosis not present

## 2021-06-24 DIAGNOSIS — I110 Hypertensive heart disease with heart failure: Secondary | ICD-10-CM | POA: Diagnosis not present

## 2021-06-24 DIAGNOSIS — M47812 Spondylosis without myelopathy or radiculopathy, cervical region: Secondary | ICD-10-CM | POA: Diagnosis not present

## 2021-06-25 ENCOUNTER — Other Ambulatory Visit: Payer: Self-pay

## 2021-06-25 ENCOUNTER — Emergency Department (HOSPITAL_COMMUNITY): Payer: Medicare Other

## 2021-06-25 ENCOUNTER — Encounter (HOSPITAL_COMMUNITY): Payer: Self-pay | Admitting: Emergency Medicine

## 2021-06-25 DIAGNOSIS — R519 Headache, unspecified: Secondary | ICD-10-CM | POA: Diagnosis not present

## 2021-06-25 DIAGNOSIS — I6523 Occlusion and stenosis of bilateral carotid arteries: Secondary | ICD-10-CM | POA: Diagnosis not present

## 2021-06-25 DIAGNOSIS — M47812 Spondylosis without myelopathy or radiculopathy, cervical region: Secondary | ICD-10-CM | POA: Diagnosis not present

## 2021-06-25 DIAGNOSIS — R29818 Other symptoms and signs involving the nervous system: Secondary | ICD-10-CM | POA: Diagnosis not present

## 2021-06-25 DIAGNOSIS — I771 Stricture of artery: Secondary | ICD-10-CM | POA: Diagnosis not present

## 2021-06-25 DIAGNOSIS — I672 Cerebral atherosclerosis: Secondary | ICD-10-CM | POA: Diagnosis not present

## 2021-06-25 LAB — BASIC METABOLIC PANEL
Anion gap: 6 (ref 5–15)
BUN: 11 mg/dL (ref 8–23)
CO2: 28 mmol/L (ref 22–32)
Calcium: 9.2 mg/dL (ref 8.9–10.3)
Chloride: 107 mmol/L (ref 98–111)
Creatinine, Ser: 0.77 mg/dL (ref 0.61–1.24)
GFR, Estimated: >60 mL/min (ref >60)
Glucose, Bld: 103 mg/dL — ABNORMAL HIGH (ref 70–99)
Potassium: 4.8 mmol/L (ref 3.5–5.1)
Sodium: 141 mmol/L (ref 135–145)

## 2021-06-25 LAB — CBC
HCT: 45.5 % (ref 39.0–52.0)
Hemoglobin: 14.4 g/dL (ref 13.0–17.0)
MCH: 31.9 pg (ref 26.0–34.0)
MCHC: 31.6 g/dL (ref 30.0–36.0)
MCV: 100.7 fL — ABNORMAL HIGH (ref 80.0–100.0)
Platelets: 246 10*3/uL (ref 150–400)
RBC: 4.52 MIL/uL (ref 4.22–5.81)
RDW: 12.7 % (ref 11.5–15.5)
WBC: 12.6 10*3/uL — ABNORMAL HIGH (ref 4.0–10.5)
nRBC: 0 % (ref 0.0–0.2)

## 2021-06-25 LAB — SEDIMENTATION RATE: Sed Rate: 13 mm/hr (ref 0–16)

## 2021-06-25 LAB — C-REACTIVE PROTEIN: CRP: 3 mg/dL — ABNORMAL HIGH (ref <1.0)

## 2021-06-25 MED ORDER — IOHEXOL 350 MG/ML SOLN
80.0000 mL | Freq: Once | INTRAVENOUS | Status: AC | PRN
  Administered 2021-06-25: 80 mL via INTRAVENOUS

## 2021-06-25 MED ORDER — OXYCODONE-ACETAMINOPHEN 5-325 MG PO TABS: 1.0000 | ORAL_TABLET | Freq: Four times a day (QID) | ORAL | 0 refills | Status: DC | PRN

## 2021-06-25 NOTE — ED Triage Notes (Addendum)
Per [facility], pt has had head and neck 10/10 pain.  Pt has hx of two neck/back surgeries.  Pt given 4mg  of Morphine inroute, improved pain.   179/109 CBG 151 20G L AC

## 2021-06-25 NOTE — Discharge Instructions (Addendum)

## 2021-06-25 NOTE — ED Provider Notes (Signed)
Emergency Medicine Provider Triage Evaluation Note  Larry Bautista , a 80 y.o. male  was evaluated in triage.  Pt complains of neck pain tonight. Patient has chronic neck pain however tonight while watching TV he got very quick onset pain to the right neck that radiated into his head and face. Severe in nature. Worse with head movements. Morphine by EMS improved pain, no other alleviating factors. No trauma. No numbness/weakness. Has not experienced this particular pain previously. .  Review of Systems  Positive: Neck pain, headache Negative: Numbness, weakness, vision change  Physical Exam  BP (!) 159/102 (BP Location: Right Arm)   Pulse 69   Temp 98.7 F (37.1 C) (Oral)   Resp 18   SpO2 95%  Gen:   Awake, no distress   Resp:  Normal effort  MSK:   Moves extremities without difficulty  Other:  Sensation grossly intact to Ues. 5/5 symmetric grip strength. No midline c spine tenderness. No significant tenderness with paraspinal muscle palpation.   Medical Decision Making  Medically screening exam initiated at 12:18 AM.  Appropriate orders placed.  Larry Bautista was informed that the remainder of the evaluation will be completed by another provider, this initial triage assessment does not replace that evaluation, and the importance of remaining in the ED until their evaluation is complete.  Head/neck pain.    Leafy Kindle 06/25/21 0035    Merrily Pew, MD 06/25/21 [phone]

## 2021-06-25 NOTE — ED Provider Notes (Addendum)
[facility] EMERGENCY DEPARTMENT Provider Note   CSN: [id number] Arrival date & time: 06/24/21  2244     History Chief Complaint  Patient presents with   Neck Pain    Larry Bautista is a 80 y.o. 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  Patient presents with complaint of headache.  He complains of sudden onset of pain which he feels at the back of his head more so on the right side and in the neck.  He also has severe pain that radiates around the scalp and into his face.  He acknowledges intermittent jaw claudication but denies any changes in vision.  He has had severe pain, sharp pain.  He has taken tramadol at home without any relief of his pain.  His wife states that this is the first time he has been pain-free in several days after several doses of morphine prior to arrival.  Patient denies fever, chills, rash.  He has a history of migraine headaches   Neck Pain     Past Medical History:  Diagnosis Date   Allergy    CHF (congestive heart failure) (HCC)    Chronic knee pain    Coronary artery disease    with LAD DE stent 2004   Dysplastic nevus 03/26/2016   right mid med calf   Dysrhythmia    Heel spur, left    Hyperlipidemia    Hypertension    Kidney stones    Lower back pain    status post surgery for spondylolisthesis   Migraine    Persistent atrial fibrillation ([place])    longstanding persistent (since 05/2008)   Plantar fasciitis     Patient Active Problem List   Diagnosis Date Noted   Hematoma of left knee region 08/22/2019   Knee pain 08/21/2019   Osteoarthritis of right shoulder 10/15/2018   Pulmonary nodule 09/30/2018   Adenocarcinoma of gastroesophageal junction ([place]) 01/14/2018   MRSA nasal colonization 01/12/2018   Acute GI  bleeding 01/07/2018   Acute blood loss anemia 01/07/2018   COPD with chronic bronchitis ([place]) 12/08/2017   Dyspnea 10/01/2016   Abnormal PFTs 10/01/2016   Hyperglycemia 03/28/2015   Allergic rhinitis 01/24/2015   Arthritis 01/24/2015   Basal cell carcinoma-1990 01/24/2015   BPH (benign prostatic hyperplasia) 01/24/2015   BMI 29.0-29.9,adult 01/24/2015   H/O renal calculi 01/24/2015   Headache, migraine 01/24/2015   AF (paroxysmal atrial fibrillation) ([place]) 01/24/2015   Spinal stenosis 01/24/2015   SPL (spondylolisthesis) 01/24/2015   Benign paroxysmal positional nystagmus 01/24/2015   Encounter for therapeutic drug monitoring 11/02/2013   Hyperlipidemia 05/03/2009   MIGRAINE HEADACHE 05/03/2009   Essential hypertension 05/03/2009   CAD in native artery 05/03/2009   KNEE PAIN 05/03/2009   Backache 05/03/2009   History of tobacco use 05/03/2009    Past Surgical History:  Procedure Laterality Date   BACK SURGERY     spondylolisthesis   CARDIAC CATHETERIZATION  03/06/2003   CORONARY ANGIOPLASTY WITH STENT PLACEMENT  04/2003   CYPHER stent implantation in the LAD   ESOPHAGOGASTRODUODENOSCOPY (EGD) WITH PROPOFOL N/A 01/07/2018   Procedure: ESOPHAGOGASTRODUODENOSCOPY (EGD) WITH PROPOFOL;  Surgeon: Wonda Horner, MD;  Location: [facility] ENDOSCOPY;  Service: Endoscopy;  Laterality: N/A;   HAND SURGERY Right 2014   carpal tunnel   INGUINAL HERNIA REPAIR Right 04/26/2020   Procedure: RIGHT INGUINAL HERNIA REPAIR  WITH MESH;  Surgeon: Coralie Keens, MD;  Location: [facility];  Service: General;  Laterality: Right;   SKIN CANCER EXCISION  1980's   Face   VASCULAR SURGERY     Vascular Stent       Family History  Problem Relation Age of Onset   Heart attack Father    Colon cancer Father    Congestive Heart Failure Father    Atrial fibrillation Brother    Stroke Brother    Bladder Cancer Brother    Lung cancer Mother    Atrial fibrillation Brother    COPD Brother     Congestive Heart Failure Brother    Lung cancer Brother    Migraines Daughter     Social History   Tobacco Use   Smoking status: Former    Packs/day: 2.00    Years: 35.00    Pack years: 70.00    Types: Cigarettes    Start date: 09/08/1954    Quit date: 11/06/1989    Years since quitting: 31.6   Smokeless tobacco: Never   Tobacco comments:    Smoked from [phone] (35 years)  Vaping Use   Vaping Use: Never used  Substance Use Topics   Alcohol use: Not Currently    Alcohol/week: 2.0 standard drinks    Types: 2 Standard drinks or equivalent per week    Comment: 1961-2009 (Last drink 2009)    Drug use: No    Home Medications Prior to Admission medications   Medication Sig Start Date End Date Taking? Authorizing Provider  acetaminophen (TYLENOL) 325 MG tablet Take 2 tablets (650 mg total) by mouth QID. Patient taking differently: Take 325 mg by mouth at bedtime. 08/25/19   Geradine Girt, DO  albuterol (PROAIR HFA) 108 (90 Base) MCG/ACT inhaler Inhale 1-2 puffs into the lungs every 6 (six) hours as needed for wheezing or shortness of breath. 12/08/17   Noralee Space, MD  Calcium 200 MG TABS Take by mouth. Patient not taking: No sig reported    [provider]  cetirizine (ZYRTEC) 10 MG tablet Take 10 mg by mouth daily.    [provider]  diclofenac Sodium (VOLTAREN) 1 % GEL Apply topically 4 (four) times daily.    [provider]  diphenhydramine-acetaminophen (TYLENOL PM) 25-500 MG TABS tablet Take 1 tablet by mouth at bedtime as needed.    [provider]  eplerenone (INSPRA) 25 MG tablet TAKE ONE (1) TABLET EACH DAY 01/29/21   Belva Crome, MD  fluticasone [facility]) 50 MCG/ACT nasal spray USE 2 SPRAYS IN [facility] NOSTRIL DAILY 11/05/20   [name], MD  gabapentin (NEURONTIN) 300 MG capsule Take 300 mg by mouth every other day.     [provider]  HYDROcodone-acetaminophen (NORCO/VICODIN) 5-325 MG tablet Take 1 tablet by mouth  every 6 (six) hours as needed for moderate pain.    [provider]  metoprolol succinate (TOPROL-XL) 25 MG 24 hr tablet TAKE ONE (1) TABLET EACH DAY 01/29/21   Belva Crome, MD  nitroGLYCERIN (NITROSTAT) 0.4 MG SL tablet Place 1 tablet (0.4 mg total) under the tongue every 5 (five) minutes as needed. 06/06/19   Belva Crome, MD  oxyCODONE-acetaminophen (PERCOCET) 5-325 MG tablet Take 1 tablet by mouth every 6 (six) hours as needed. 06/25/21   Margarita Mail, PA-C  pantoprazole (PROTONIX) 40 MG tablet TAKE ONE (1) TABLET EACH DAY 02/07/21   Ladell Pier, MD  psyllium (METAMUCIL) 58.6 % powder  Take 1 packet by mouth daily.     [provider]  rosuvastatin (CRESTOR) 20 MG tablet TAKE ONE (1) TABLET EACH DAY 12/18/20   Belva Crome, MD  tamsulosin (FLOMAX) 0.4 MG CAPS capsule Take 0.4 mg by mouth daily after supper.    [provider]  traMADol (ULTRAM) 50 MG tablet Take 1 tablet (50 mg total) by mouth every 6 (six) hours as needed for severe pain. Patient not taking: No sig reported 04/26/20   Coralie Keens, MD  vitamin C (ASCORBIC ACID) 500 MG tablet Take 500 mg by mouth daily.    [provider]  warfarin (COUMADIN) 2.5 MG tablet Take 1.25mg  (1/2 tablet) on Sunday, Tuesday, Thursday, and Saturday or as directed by Anticoagulation Clinic. 04/30/21   Belva Crome, MD  warfarin (COUMADIN) 5 MG tablet Take 1/2 tablet by mouth on Monday, Wednesday, and Friday or as directed by Coumadin Clinic. 01/10/21   Belva Crome, MD    Allergies    Celecoxib, Lisinopril, and Simvastatin  Review of Systems   Review of Systems  Musculoskeletal:  Positive for neck pain.  Ten systems reviewed and are negative for acute change, except as noted in the HPI.   Physical Exam Updated Vital Signs BP 119/73 (BP Location: Left Arm)   Pulse (!) 56   Temp 97.7 F (36.5 C) (Oral)   Resp 16   SpO2 97%   Physical Exam Vitals and nursing note reviewed.  Constitutional:       General: He is not in acute distress.    Appearance: He is well-developed. He is not diaphoretic.  HENT:     Head: Normocephalic and atraumatic.  Eyes:     General: No scleral icterus.    Conjunctiva/sclera: Conjunctivae normal.     Pupils: Pupils are equal, round, and reactive to light.     Comments: No horizontal, vertical or rotational nystagmus  Neck:     Comments: Full active and passive ROM with No midline  tenderness BL Paraspinal tenderness, no tenderness over the greater or lesser occipital nerves BL No nuchal rigidity or meningeal signs Cardiovascular:     Rate and Rhythm: Normal rate and regular rhythm.  Pulmonary:     Effort: Pulmonary effort is normal. No respiratory distress.     Breath sounds: Normal breath sounds. No wheezing or rales.  Abdominal:     General: Bowel sounds are normal.     Palpations: Abdomen is soft.     Tenderness: There is no abdominal tenderness. There is no guarding or rebound.  Musculoskeletal:        General: Normal range of motion.     Cervical back: Normal range of motion and neck supple.  Lymphadenopathy:     Cervical: No cervical adenopathy.  Skin:    General: Skin is warm and dry.     Findings: No rash.  Neurological:     Mental Status: He is alert and oriented to person, place, and time.     Cranial Nerves: No cranial nerve deficit.     Motor: No abnormal muscle tone.     Coordination: Coordination normal.     Deep Tendon Reflexes: Reflexes are normal and symmetric.     Comments: Mental Status:  Alert, oriented, thought content appropriate. Speech fluent without evidence of aphasia. Able to follow 2 step commands without difficulty.  Cranial Nerves:  II:  Peripheral visual fields grossly normal, pupils equal, round, reactive to light III,IV, VI: ptosis not present, extra-ocular  motions intact bilaterally  V,VII: smile symmetric, facial light touch sensation equal VIII: hearing grossly normal bilaterally  IX,X: midline uvula  rise  XI: bilateral shoulder shrug equal and strong XII: midline tongue extension  Motor:  5/5 in upper and lower extremities bilaterally including strong and equal grip strength and dorsiflexion/plantar flexion Sensory: Pinprick and light touch normal in all extremities.  Deep Tendon Reflexes: 2+ and symmetric  Cerebellar: normal finger-to-nose with bilateral upper extremities Gait: normal gait and balance CV: distal pulses palpable throughout   Psychiatric:        Behavior: Behavior normal.        Thought Content: Thought content normal.        Judgment: Judgment normal.    ED Results / Procedures / Treatments   Labs (all labs ordered are listed, but only abnormal results are displayed) Labs Reviewed  CBC - Abnormal; Notable for the following components:      Result Value   WBC 12.6 (*)    MCV 100.7 (*)    All other components within normal limits  BASIC METABOLIC PANEL - Abnormal; Notable for the following components:   Glucose, Bld 103 (*)    All other components within normal limits  C-REACTIVE PROTEIN - Abnormal; Notable for the following components:   CRP 3.0 (*)    All other components within normal limits  SEDIMENTATION RATE    EKG None  Radiology CT Angio Head W or Wo Contrast  Result Date: 06/25/2021 CLINICAL DATA:  80 year old male with neck pain, neurologic deficit. EXAM: CT ANGIOGRAPHY HEAD AND NECK TECHNIQUE: Multidetector CT imaging of the head and neck was performed using the standard protocol during bolus administration of intravenous contrast. Multiplanar CT image reconstructions and MIPs were obtained to evaluate the vascular anatomy. Carotid stenosis measurements (when applicable) are obtained utilizing NASCET criteria, using the distal internal carotid diameter as the denominator. CONTRAST:  79mL OMNIPAQUE IOHEXOL 350 MG/ML SOLN COMPARISON:  Cervical spine radiographs 10/07/2012. FINDINGS: CT HEAD Brain: Calcified atherosclerosis at the skull base.  Cerebral volume is within normal limits for age. No midline shift, ventriculomegaly, mass effect, evidence of mass lesion, intracranial hemorrhage or evidence of cortically based acute infarction. There is moderate for age patchy and confluent bilateral cerebral white matter hypodensity, heterogeneous hypodensity in the bilateral basal ganglia. No cortical encephalomalacia identified. Calvarium and skull base: No acute osseous abnormality identified. Paranasal sinuses: Mild mucosal thickening bilateral maxillary sinuses. Other Visualized paranasal sinuses and mastoids are well aerated. Orbits: Visualized orbits and scalp soft tissues are within normal limits. CTA NECK Skeleton: Widespread cervical spine degeneration, with severe facet arthropathy at C2-C3 on the left and suspicion of developing facet ankylosis there (series 13, image 88). Lower cervical disc and endplate degeneration. Probable benign C7 vertebral body bone island. No acute osseous abnormality identified. Upper chest: Small low-density layering left pleural effusion visible in the apex. Centrilobular emphysema. No superior mediastinal lymphadenopathy. Other neck: No acute finding. Aortic arch: Moderate Calcified aortic atherosclerosis. 3 vessel arch configuration. Right carotid system: Mild brachiocephalic artery plaque without stenosis. Tortuous right CCA origin. Minimal calcified plaque at the right carotid bifurcation affecting the ECA. Mildly tortuous but otherwise negative cervical right ICA. Left carotid system: Mildly tortuous left CCA. Negative left carotid bifurcation. Negative cervical left ICA. Vertebral arteries: Mild calcified plaque and tortuosity of the proximal right subclavian artery without stenosis. Normal right vertebral artery origin. Tortuous right V1 segment. The right vertebral is non dominant but patent to the skull base without plaque  or stenosis. Mild to moderate atherosclerosis of the proximal left subclavian artery  without stenosis. Normal left vertebral artery origin. Dominant left vertebral. Tortuous left V1 segment. Left vertebral is patent to the skull base without stenosis. CTA HEAD Posterior circulation: Dominant left V4 segment. Normal left PICA origin. Diminutive right V4 continues to the basilar. Patent vertebrobasilar junction and basilar artery with mild tortuosity but no stenosis. Patent SCA and PCA origins. Right posterior communicating artery is present. The left is diminutive or absent. Mildly tortuous left PCA branches. No PCA stenosis. Anterior circulation: Both ICA siphons are patent. There is moderate bilateral siphon calcified plaque. There is mild to moderate supraclinoid stenosis on the left (series 13, image 87). Similar mild to moderate supraclinoid stenosis on the right. Normal right posterior communicating artery origin. Patent carotid termini. Normal MCA and ACA origins. Diminutive anterior communicating artery. Bilateral ACA branches are within normal limits. Left MCA M1 segment and trifurcation are patent without stenosis. Left MCA branches are within normal limits. Right MCA M1 bifurcates early without stenosis. Right MCA branches are within normal limits. Venous sinuses: Patent. Anatomic variants: Dominant left vertebral artery. Review of the MIP images confirms the above findings IMPRESSION: 1. Negative for large vessel occlusion. Minimal atherosclerosis in the neck. Moderate bilateral ICA siphon calcified plaque with mild to moderate bilateral supraclinoid ICA stenosis. 2. No acute intracranial abnormality by CT. Moderate for age bilateral white matter and basal ganglia hypodensity compatible with small vessel disease. 3. Small layering left pleural effusion in the lung apex. Emphysema (ICD10-J43.9). Aortic Atherosclerosis (ICD10-I70.0). 4. Widespread cervical spine degeneration, with severe left C2-C3 facet arthropathy and possible developing facet ankylosis. Electronically Signed   By: Genevie Ann M.D.   On: 06/25/2021 06:41   CT Angio Neck W and/or Wo Contrast  Result Date: 06/25/2021 CLINICAL DATA:  80 year old male with neck pain, neurologic deficit. EXAM: CT ANGIOGRAPHY HEAD AND NECK TECHNIQUE: Multidetector CT imaging of the head and neck was performed using the standard protocol during bolus administration of intravenous contrast. Multiplanar CT image reconstructions and MIPs were obtained to evaluate the vascular anatomy. Carotid stenosis measurements (when applicable) are obtained utilizing NASCET criteria, using the distal internal carotid diameter as the denominator. CONTRAST:  50mL OMNIPAQUE IOHEXOL 350 MG/ML SOLN COMPARISON:  Cervical spine radiographs 10/07/2012. FINDINGS: CT HEAD Brain: Calcified atherosclerosis at the skull base. Cerebral volume is within normal limits for age. No midline shift, ventriculomegaly, mass effect, evidence of mass lesion, intracranial hemorrhage or evidence of cortically based acute infarction. There is moderate for age patchy and confluent bilateral cerebral white matter hypodensity, heterogeneous hypodensity in the bilateral basal ganglia. No cortical encephalomalacia identified. Calvarium and skull base: No acute osseous abnormality identified. Paranasal sinuses: Mild mucosal thickening bilateral maxillary sinuses. Other Visualized paranasal sinuses and mastoids are well aerated. Orbits: Visualized orbits and scalp soft tissues are within normal limits. CTA NECK Skeleton: Widespread cervical spine degeneration, with severe facet arthropathy at C2-C3 on the left and suspicion of developing facet ankylosis there (series 13, image 88). Lower cervical disc and endplate degeneration. Probable benign C7 vertebral body bone island. No acute osseous abnormality identified. Upper chest: Small low-density layering left pleural effusion visible in the apex. Centrilobular emphysema. No superior mediastinal lymphadenopathy. Other neck: No acute finding. Aortic arch:  Moderate Calcified aortic atherosclerosis. 3 vessel arch configuration. Right carotid system: Mild brachiocephalic artery plaque without stenosis. Tortuous right CCA origin. Minimal calcified plaque at the right carotid bifurcation affecting the ECA. Mildly tortuous but otherwise negative  cervical right ICA. Left carotid system: Mildly tortuous left CCA. Negative left carotid bifurcation. Negative cervical left ICA. Vertebral arteries: Mild calcified plaque and tortuosity of the proximal right subclavian artery without stenosis. Normal right vertebral artery origin. Tortuous right V1 segment. The right vertebral is non dominant but patent to the skull base without plaque or stenosis. Mild to moderate atherosclerosis of the proximal left subclavian artery without stenosis. Normal left vertebral artery origin. Dominant left vertebral. Tortuous left V1 segment. Left vertebral is patent to the skull base without stenosis. CTA HEAD Posterior circulation: Dominant left V4 segment. Normal left PICA origin. Diminutive right V4 continues to the basilar. Patent vertebrobasilar junction and basilar artery with mild tortuosity but no stenosis. Patent SCA and PCA origins. Right posterior communicating artery is present. The left is diminutive or absent. Mildly tortuous left PCA branches. No PCA stenosis. Anterior circulation: Both ICA siphons are patent. There is moderate bilateral siphon calcified plaque. There is mild to moderate supraclinoid stenosis on the left (series 13, image 87). Similar mild to moderate supraclinoid stenosis on the right. Normal right posterior communicating artery origin. Patent carotid termini. Normal MCA and ACA origins. Diminutive anterior communicating artery. Bilateral ACA branches are within normal limits. Left MCA M1 segment and trifurcation are patent without stenosis. Left MCA branches are within normal limits. Right MCA M1 bifurcates early without stenosis. Right MCA branches are within  normal limits. Venous sinuses: Patent. Anatomic variants: Dominant left vertebral artery. Review of the MIP images confirms the above findings IMPRESSION: 1. Negative for large vessel occlusion. Minimal atherosclerosis in the neck. Moderate bilateral ICA siphon calcified plaque with mild to moderate bilateral supraclinoid ICA stenosis. 2. No acute intracranial abnormality by CT. Moderate for age bilateral white matter and basal ganglia hypodensity compatible with small vessel disease. 3. Small layering left pleural effusion in the lung apex. Emphysema (ICD10-J43.9). Aortic Atherosclerosis (ICD10-I70.0). 4. Widespread cervical spine degeneration, with severe left C2-C3 facet arthropathy and possible developing facet ankylosis. Electronically Signed   By: Genevie Ann M.D.   On: 06/25/2021 06:41    Procedures Procedures   Medications Ordered in ED Medications  iohexol (OMNIPAQUE) 350 MG/ML injection 80 mL (80 mLs Intravenous Contrast Given 06/25/21 2035)    ED Course  I have reviewed the triage vital signs and the nursing notes.  Pertinent labs & imaging results that were available during my care of the patient were reviewed by me and considered in my medical decision making (see chart for details).    MDM Rules/Calculators/A&P                            Larry Bautista presents with headache Given the large differential diagnosis for Larry Bautista, the decision making in this case is of high complexity.  I reviewed labs including CBC which shows mildly elevated white blood cell count of 12.6 likely acute phase reaction, BMP with mildly elevated glucose also of insignificant value.  Sed rate within normal limits, CRP just above normal.  I CT angiogram of the head and neck which showed no acute abnormalities.  I discussed findings of the CT scan of the head and neck including finding of pleural effusion at the apex of the lung. Patient does not appear to have giant cell arteritis given his sed  rate and CRP values.  He has no headache at this time.[name] PDMP reviewed during this encounter. Pain medications given at discharge.   After evaluating  all of the data points in this case, the presentation of Larry Bautista is NOT consistent with skull fracture, meningitis/encephalitis, SAH/sentinel bleed, Intracranial Hemorrhage (ICH) (subdural/epidural), acute obstructive hydrocephalus, space occupying lesions, CVA, CO Poisoning, Basilar/vertebral artery dissection, preeclampsia, cerebral venous thrombosis, hypertensive emergency, temporal Arteritis, Idiopathic Intracranial Hypertension (pseudotumor cerebri).  Strict return and follow-up precautions have been given by me personally or by detailed written instructions verbalized by nursing staff using the teach back method to patient/family/caregiver.  Data Reviewed/Counseling: I have reviewed the patient's vital signs, nursing notes, and other relevant tests/information. I had a detailed discussion regarding the historical points, exam findings, and any diagnostic results supporting the discharge diagnosis. I also discussed the need for outpatient follow-up and the need to return to the ED if symptoms worsen or if there are any questions or concerns that arise at hom  Final Clinical Impression(s) / ED Diagnoses Final diagnoses:  Bad headache    Rx / DC Orders ED Discharge Orders          Ordered    oxyCODONE-acetaminophen (PERCOCET) 5-325 MG tablet  Every 6 hours PRN,   Status:  Discontinued        06/25/21 1246    oxyCODONE-acetaminophen (PERCOCET) 5-325 MG tablet  Every 6 hours PRN        06/25/21 1249             Margarita Mail, PA-C 06/25/21 1316    Margarita Mail, PA-C 06/25/21 1317    Valarie Merino, MD 06/30/21 [phone]

## 2021-06-26 ENCOUNTER — Encounter: Payer: Self-pay | Admitting: Oncology

## 2021-06-28 ENCOUNTER — Ambulatory Visit (INDEPENDENT_AMBULATORY_CARE_PROVIDER_SITE_OTHER): Payer: Medicare Other | Admitting: *Deleted

## 2021-06-28 ENCOUNTER — Other Ambulatory Visit: Payer: Self-pay

## 2021-06-28 DIAGNOSIS — I4891 Unspecified atrial fibrillation: Secondary | ICD-10-CM

## 2021-06-28 DIAGNOSIS — Z5181 Encounter for therapeutic drug level monitoring: Secondary | ICD-10-CM | POA: Diagnosis not present

## 2021-06-28 LAB — POCT INR: INR: 2.5 (ref 2.0–3.0)

## 2021-06-28 NOTE — Patient Instructions (Signed)
Description   Continue taking warfarin 1.25 mg daily except  2.5 mg on Mondays, Wednesdays and Fridays. Recheck INR in 6 weeks. Call with any new medications or procedure/surgery dates. Coumadin Clinic # [phone]. Main # [phone]. Fax number for shoulder procedure clearance [phone].

## 2021-07-09 DIAGNOSIS — M2578 Osteophyte, vertebrae: Secondary | ICD-10-CM | POA: Diagnosis not present

## 2021-07-09 DIAGNOSIS — M47812 Spondylosis without myelopathy or radiculopathy, cervical region: Secondary | ICD-10-CM | POA: Diagnosis not present

## 2021-07-09 DIAGNOSIS — M4802 Spinal stenosis, cervical region: Secondary | ICD-10-CM | POA: Diagnosis not present

## 2021-07-09 DIAGNOSIS — M542 Cervicalgia: Secondary | ICD-10-CM | POA: Diagnosis not present

## 2021-07-25 ENCOUNTER — Ambulatory Visit (INDEPENDENT_AMBULATORY_CARE_PROVIDER_SITE_OTHER): Payer: Medicare Other

## 2021-07-25 VITALS — BP 160/82 | Temp 98.2°F | Resp 24 | Ht 67.0 in | Wt 193.6 lb

## 2021-07-25 DIAGNOSIS — Z0000 Encounter for general adult medical examination without abnormal findings: Secondary | ICD-10-CM | POA: Diagnosis not present

## 2021-07-25 NOTE — Progress Notes (Signed)
Subjective:   Larry Bautista is a 80 y.o. male who presents for Medicare Annual/Subsequent preventive examination.  Review of Systems           Objective:    Today's Vitals   07/25/21 1442 07/25/21 1449  BP: (!) 160/82   Resp: (!) 24   Temp: 98.2 F (36.8 C)   TempSrc: Oral   Weight: 193 lb 9.6 oz (87.8 kg)   Height: 5\' 7"  (1.702 m)   PainSc:  1    Body mass index is 30.32 kg/m.  Advanced Directives 07/25/2021 07/19/2020 04/26/2020 04/19/2020 08/22/2019 08/21/2019 10/18/2018  Does Patient Have a Medical Advance Directive? Yes Yes Yes Yes Yes No Yes  Type of Paramedic of [place];Living will [place];Living will - [place];Living will Living will;Healthcare Power of Attorney - Living will;Healthcare Power of Attorney  Does patient want to make changes to medical advance directive? Yes (Inpatient - patient defers changing a medical advance directive and declines information at this time) - No - Patient declined No - Patient declined No - Guardian declined - No - Patient declined  Copy of [place] in Chart? Yes - validated most recent copy scanned in chart (See row information) Yes - validated most recent copy scanned in chart (See row information) - Yes - validated most recent copy scanned in chart (See row information) No - copy requested - Yes - validated most recent copy scanned in chart (See row information)  Would patient like information on creating a medical advance directive? - - - - No - Patient declined No - Patient declined -    Current Medications (verified) Outpatient Encounter Medications as of 07/25/2021  Medication Sig   acetaminophen (TYLENOL) 325 MG tablet Take 2 tablets (650 mg total) by mouth QID. (Patient taking differently: Take 325 mg by mouth at bedtime.)   albuterol (PROAIR HFA) 108 (90 Base) MCG/ACT inhaler Inhale 1-2 puffs into the lungs every 6 (six) hours as needed  for wheezing or shortness of breath.   Calcium 200 MG TABS Take by mouth. (Patient not taking: No sig reported)   cetirizine (ZYRTEC) 10 MG tablet Take 10 mg by mouth daily.   diclofenac Sodium (VOLTAREN) 1 % GEL Apply topically 4 (four) times daily.   diphenhydramine-acetaminophen (TYLENOL PM) 25-500 MG TABS tablet Take 1 tablet by mouth at bedtime as needed.   eplerenone (INSPRA) 25 MG tablet TAKE ONE (1) TABLET EACH DAY   fluticasone (FLONASE) 50 MCG/ACT nasal spray USE 2 SPRAYS IN EACH NOSTRIL DAILY   gabapentin (NEURONTIN) 300 MG capsule Take 300 mg by mouth every other day.    HYDROcodone-acetaminophen (NORCO/VICODIN) 5-325 MG tablet Take 1 tablet by mouth every 6 (six) hours as needed for moderate pain.   metoprolol succinate (TOPROL-XL) 25 MG 24 hr tablet TAKE ONE (1) TABLET EACH DAY   nitroGLYCERIN (NITROSTAT) 0.4 MG SL tablet Place 1 tablet (0.4 mg total) under the tongue every 5 (five) minutes as needed.   oxyCODONE-acetaminophen (PERCOCET) 5-325 MG tablet Take 1 tablet by mouth every 6 (six) hours as needed.   pantoprazole (PROTONIX) 40 MG tablet TAKE ONE (1) TABLET EACH DAY   psyllium (METAMUCIL) 58.6 % powder Take 1 packet by mouth daily.    rosuvastatin (CRESTOR) 20 MG tablet TAKE ONE (1) TABLET EACH DAY   tamsulosin (FLOMAX) 0.4 MG CAPS capsule Take 0.4 mg by mouth daily after supper.   traMADol (ULTRAM) 50 MG tablet Take  1 tablet (50 mg total) by mouth every 6 (six) hours as needed for severe pain. (Patient not taking: No sig reported)   vitamin C (ASCORBIC ACID) 500 MG tablet Take 500 mg by mouth daily.   warfarin (COUMADIN) 2.5 MG tablet Take 1.25mg  (1/2 tablet) on Sunday, Tuesday, Thursday, and Saturday or as directed by Anticoagulation Clinic.   warfarin (COUMADIN) 5 MG tablet Take 1/2 tablet by mouth on Monday, Wednesday, and Friday or as directed by Coumadin Clinic.   No facility-administered encounter medications on file as of 07/25/2021.    Allergies  (verified) Celecoxib, Lisinopril, and Simvastatin   History: Past Medical History:  Diagnosis Date   Allergy    CHF (congestive heart failure) (HCC)    Chronic knee pain    Coronary artery disease    with LAD DE stent 2004   Dysplastic nevus 03/26/2016   right mid med calf   Dysrhythmia    Heel spur, left    Hyperlipidemia    Hypertension    Kidney stones    Lower back pain    status post surgery for spondylolisthesis   Migraine    Persistent atrial fibrillation ([place])    longstanding persistent (since 05/2008)   Plantar fasciitis    Past Surgical History:  Procedure Laterality Date   BACK SURGERY     spondylolisthesis   CARDIAC CATHETERIZATION  03/06/2003   CORONARY ANGIOPLASTY WITH STENT PLACEMENT  04/2003   CYPHER stent implantation in the LAD   ESOPHAGOGASTRODUODENOSCOPY (EGD) WITH PROPOFOL N/A 01/07/2018   Procedure: ESOPHAGOGASTRODUODENOSCOPY (EGD) WITH PROPOFOL;  Surgeon: Wonda Horner, MD;  Location: [facility];  Service: Endoscopy;  Laterality: N/A;   HAND SURGERY Right 2014   carpal tunnel   INGUINAL HERNIA REPAIR Right 04/26/2020   Procedure: RIGHT INGUINAL HERNIA REPAIR WITH MESH;  Surgeon: Coralie Keens, MD;  Location: [facility];  Service: General;  Laterality: Right;   SKIN CANCER EXCISION  1980's   Face   VASCULAR SURGERY     Vascular Stent   Family History  Problem Relation Age of Onset   Heart attack Father    Colon cancer Father    Congestive Heart Failure Father    Atrial fibrillation Brother    Stroke Brother    Bladder Cancer Brother    Lung cancer Mother    Atrial fibrillation Brother    COPD Brother    Congestive Heart Failure Brother    Lung cancer Brother    Migraines Daughter    Social History   Socioeconomic History   Marital status: Married    Spouse name: Scotland Korver   Number of children: 1   Years of education: Not on file   Highest education level: Bachelor's degree (e.g., BA, AB, BS)   Occupational History   Occupation: retired  Tobacco Use   Smoking status: Former    Packs/day: 2.00    Years: 35.00    Pack years: 70.00    Types: Cigarettes    Start date: 09/08/1954    Quit date: 11/06/1989    Years since quitting: 31.7   Smokeless tobacco: Never   Tobacco comments:    Smoked from [phone] (35 years)  Vaping Use   Vaping Use: Never used  Substance and Sexual Activity   Alcohol use: Not Currently    Alcohol/week: 2.0 standard drinks    Types: 2 Standard drinks or equivalent per week    Comment: 1961-2009 (Last drink 2009)    Drug use: No  Sexual activity: Not on file  Other Topics Concern   Not on file  Social History Narrative   Retired Insurance underwriter. Married in 2015. Has one daughter, and 4 grandchildren. He lives in [place].   Social Determinants of Health   Financial Resource Strain: Low Risk    Difficulty of Paying Living Expenses: Not hard at all  Food Insecurity: No Food Insecurity   Worried About Charity fundraiser in the Last Year: Never true   [place] in the Last Year: Never true  Transportation Needs: No Transportation Needs   Lack of Transportation (Medical): No   Lack of Transportation (Non-Medical): No  Physical Activity: Unknown   Days of Exercise per Week: Not on file   Minutes of Exercise per Session: 0 min  Stress: No Stress Concern Present   Feeling of Stress : Not at all  Social Connections: Moderately Isolated   Frequency of Communication with Friends and Family: Once a week   Frequency of Social Gatherings with Friends and Family: Never   Attends Religious Services: More than 4 times per year   Active Member of [employer] or Organizations: No   Attends Archivist Meetings: Never   Marital Status: Married    Tobacco Counseling Counseling given: Not Answered Tobacco comments: Smoked from [phone] (35 years)   Clinical Intake:  Pre-visit preparation completed: Yes  Pain : 0-10 Pain Score: 1  Pain Type:  Chronic pain Pain Location: Shoulder Pain Orientation: Right, Upper Pain Descriptors / Indicators: Aching Pain Onset: More than a month ago Pain Frequency: Intermittent     Nutritional Status: BMI > 30  Obese Nutritional Risks: None Diabetes: No  How often do you need to have someone help you when you read instructions, pamphlets, or other written materials from your doctor or pharmacy?: 1 - Never    Information entered by :: Kirke Shaggy, LPN   Activities of Daily Living No flowsheet data found.  Patient Care Team: [name], MD as PCP - General (Family Medicine) Belva Crome, MD as PCP - Cardiology (Cardiology) Festus Aloe, MD as Consulting Physician (Urology) Verner Chol, MD as Consulting Physician (Sports Medicine) Coralie Keens, MD as Consulting Physician (General Surgery) Ladell Pier, MD as Consulting Physician (Oncology) Kristeen Miss, MD as Consulting Physician (Neurosurgery) [facility]  Indicate any recent Medical Services you may have received from other than [facility] providers in the past year (date may be approximate).     Assessment:   This is a routine wellness examination for Gerald.  Hearing/Vision screen No results found.  Dietary issues and exercise activities discussed:     Goals Addressed             This Visit's Progress    Cut out extra servings         Depression Screen PHQ 2/9 Scores 07/25/2021 07/19/2020 09/16/2018 01/19/2018 11/12/2017 07/09/2016 05/13/2016  PHQ - 2 Score 2 0 0 0 0 0 0  PHQ- 9 Score 4 - - - - - -    Fall Risk Fall Risk  07/25/2021 07/19/2020 09/16/2018 01/19/2018 11/12/2017  Falls in the past year? 1 1 0 No No  Number falls in past yr: 0 1 - - -  Injury with Fall? 0 0 - - -  Risk for fall due to : History of fall(s);Impaired balance/gait;Impaired mobility - - - -  Follow up Falls prevention discussed Falls prevention discussed - - -    FALL  RISK PREVENTION PERTAINING TO  THE HOME:  Any stairs in or around the home? Yes  If so, are there any without handrails? No  Home free of loose throw rugs in walkways, pet beds, electrical cords, etc? Yes  Adequate lighting in your home to reduce risk of falls? Yes   ASSISTIVE DEVICES UTILIZED TO PREVENT FALLS:  Life alert? No  Use of a cane, walker or w/c? Yes  Grab bars in the bathroom? Yes  Shower chair or bench in shower? Yes  Elevated toilet seat or a handicapped toilet? No   TIMED UP AND GO:  Was the test performed? Yes .  Length of time to ambulate 10 feet: 7 sec.   Gait slow and steady with assistive device  Cognitive Function:     6CIT Screen 07/09/2016  What Year? 0 points  What month? 0 points  What time? 0 points  Count back from 20 0 points  Months in reverse 0 points  Repeat phrase 0 points  Total Score 0    Immunizations Immunization History  Administered Date(s) Administered   Influenza, High Dose Seasonal PF 05/01/2016, 04/08/2017, 04/22/2018   Influenza-Unspecified 06/04/2015, 05/11/2020, 06/04/2021   Moderna Sars-Covid-2 Vaccination 12/07/2019, 01/04/2020, 07/09/2020   Pneumococcal Conjugate-13 03/28/2015   Pneumococcal Polysaccharide-23 02/08/2020   Tdap 05/17/2012   Zoster Recombinat (Shingrix) 06/21/2019, 01/11/2020   Zoster, Live 11/04/2005    TDAP status: Up to date  Flu Vaccine status: Up to date  Pneumococcal vaccine status: Up to date  Covid-19 vaccine status: Completed vaccines  Qualifies for Shingles Vaccine? Yes   Zostavax completed Yes   Shingrix Completed?: Yes  Screening Tests Health Maintenance  Topic Date Due   COVID-19 Vaccine (4 - Booster for Moderna series) 09/03/2020   TETANUS/TDAP  05/17/2022   Pneumonia Vaccine 4+ Years old  Completed   INFLUENZA VACCINE  Completed   Zoster Vaccines- Shingrix  Completed   HPV VACCINES  Aged Out    Health Maintenance  Health Maintenance Due  Topic Date Due   COVID-19 Vaccine (4 - Booster for Moderna  series) 09/03/2020    Colorectal cancer screening: No longer required.   Lung Cancer Screening: (Low Dose CT Chest recommended if Age 44-80 years, 30 pack-year currently smoking OR have quit w/in 15years.) does not qualify.    Additional Screening:  Hepatitis C Screening: does not qualify;   Vision Screening: Recommended annual ophthalmology exams for early detection of glaucoma and other disorders of the eye. Is the patient up to date with their annual eye exam?  Yes  Who is the provider or what is the name of the office in which the patient attends annual eye exams? Doesn't remember name of MD  Dental Screening: Recommended annual dental exams for proper oral hygiene  Community Resource Referral / Chronic Care Management: CRR required this visit?  No   CCM required this visit?  No      Plan:     I have personally reviewed and noted the following in the patient's chart:   Medical and social history Use of alcohol, tobacco or illicit drugs  Current medications and supplements including opioid prescriptions. Patient is currently taking opioid prescriptions. Information provided to patient regarding non-opioid alternatives. Patient advised to discuss non-opioid treatment plan with their provider. Functional ability and status Nutritional status Physical activity Advanced directives List of other physicians Hospitalizations, surgeries, and ER visits in previous 12 months Vitals Screenings to include cognitive, depression, and falls Referrals and appointments  In addition,  I have reviewed and discussed with patient certain preventive protocols, quality metrics, and best practice recommendations. A written personalized care plan for preventive services as well as general preventive health recommendations were provided to patient.     Dionisio David, LPN   [id number]   Nurse Notes: none

## 2021-07-25 NOTE — Patient Instructions (Signed)
Larry Bautista , Thank you for taking time to come for your Medicare Wellness Visit. I appreciate your ongoing commitment to your health goals. Please review the following plan we discussed and let me know if I can assist you in the future.   Screening recommendations/referrals: Colonoscopy: 20 years ago Recommended yearly ophthalmology/optometry visit for glaucoma screening and checkup Recommended yearly dental visit for hygiene and checkup  Vaccinations: Influenza vaccine: 06/04/21 Pneumococcal vaccine: 02/08/20 Tdap vaccine: 05/17/12 Shingles vaccine: 06/21/19, 01/11/20   Covid-19: 12/07/19, 01/04/20, 07/09/20  Advanced directives: yes  Conditions/risks identified:   Next appointment: Follow up in one year for your annual wellness visit.   Preventive Care 42 Years and Older, Male Preventive care refers to lifestyle choices and visits with your health care provider that can promote health and wellness. What does preventive care include? A yearly physical exam. This is also called an annual well check. Dental exams once or twice a year. Routine eye exams. Ask your health care provider how often you should have your eyes checked. Personal lifestyle choices, including: Daily care of your teeth and gums. Regular physical activity. Eating a healthy diet. Avoiding tobacco and drug use. Limiting alcohol use. Practicing safe sex. Taking low doses of aspirin every day. Taking vitamin and mineral supplements as recommended by your health care provider. What happens during an annual well check? The services and screenings done by your health care provider during your annual well check will depend on your age, overall health, lifestyle risk factors, and family history of disease. Counseling  Your health care provider may ask you questions about your: Alcohol use. Tobacco use. Drug use. Emotional well-being. Home and relationship well-being. Sexual activity. Eating habits. History of  falls. Memory and ability to understand (cognition). Work and work Statistician. Screening  You may have the following tests or measurements: Height, weight, and BMI. Blood pressure. Lipid and cholesterol levels. These may be checked every 5 years, or more frequently if you are over 8 years old. Skin check. Lung cancer screening. You may have this screening every year starting at age 49 if you have a 30-pack-year history of smoking and currently smoke or have quit within the past 15 years. Fecal occult blood test (FOBT) of the stool. You may have this test every year starting at age 27. Flexible sigmoidoscopy or colonoscopy. You may have a sigmoidoscopy every 5 years or a colonoscopy every 10 years starting at age 54. Prostate cancer screening. Recommendations will vary depending on your family history and other risks. Hepatitis C blood test. Hepatitis B blood test. Sexually transmitted disease (STD) testing. Diabetes screening. This is done by checking your blood sugar (glucose) after you have not eaten for a while (fasting). You may have this done every 1-3 years. Abdominal aortic aneurysm (AAA) screening. You may need this if you are a current or former smoker. Osteoporosis. You may be screened starting at age 24 if you are at high risk. Talk with your health care provider about your test results, treatment options, and if necessary, the need for more tests. Vaccines  Your health care provider may recommend certain vaccines, such as: Influenza vaccine. This is recommended every year. Tetanus, diphtheria, and acellular pertussis (Tdap, Td) vaccine. You may need a Td booster every 10 years. Zoster vaccine. You may need this after age 37. Pneumococcal 13-valent conjugate (PCV13) vaccine. One dose is recommended after age 37. Pneumococcal polysaccharide (PPSV23) vaccine. One dose is recommended after age 80. Talk to your health care provider about  which screenings and vaccines you need and  how often you need them. This information is not intended to replace advice given to you by your health care provider. Make sure you discuss any questions you have with your health care provider. Document Released: 09/21/2015 Document Revised: 05/14/2016 Document Reviewed: 06/26/2015 Elsevier Interactive Patient Education  2017 [employer] Prevention in the Home Falls can cause injuries. They can happen to people of all ages. There are many things you can do to make your home safe and to help prevent falls. What can I do on the outside of my home? Regularly fix the edges of walkways and driveways and fix any cracks. Remove anything that might make you trip as you walk through a door, such as a raised step or threshold. Trim any bushes or trees on the path to your home. Use bright outdoor lighting. Clear any walking paths of anything that might make someone trip, such as rocks or tools. Regularly check to see if handrails are loose or broken. Make sure that both sides of any steps have handrails. Any raised decks and porches should have guardrails on the edges. Have any leaves, snow, or ice cleared regularly. Use sand or salt on walking paths during winter. Clean up any spills in your garage right away. This includes oil or grease spills. What can I do in the bathroom? Use night lights. Install grab bars by the toilet and in the tub and shower. Do not use towel bars as grab bars. Use non-skid mats or decals in the tub or shower. If you need to sit down in the shower, use a plastic, non-slip stool. Keep the floor dry. Clean up any water that spills on the floor as soon as it happens. Remove soap buildup in the tub or shower regularly. Attach bath mats securely with double-sided non-slip rug tape. Do not have throw rugs and other things on the floor that can make you trip. What can I do in the bedroom? Use night lights. Make sure that you have a light by your bed that is easy to  reach. Do not use any sheets or blankets that are too big for your bed. They should not hang down onto the floor. Have a firm chair that has side arms. You can use this for support while you get dressed. Do not have throw rugs and other things on the floor that can make you trip. What can I do in the kitchen? Clean up any spills right away. Avoid walking on wet floors. Keep items that you use a lot in easy-to-reach places. If you need to reach something above you, use a strong step stool that has a grab bar. Keep electrical cords out of the way. Do not use floor polish or wax that makes floors slippery. If you must use wax, use non-skid floor wax. Do not have throw rugs and other things on the floor that can make you trip. What can I do with my stairs? Do not leave any items on the stairs. Make sure that there are handrails on both sides of the stairs and use them. Fix handrails that are broken or loose. Make sure that handrails are as long as the stairways. Check any carpeting to make sure that it is firmly attached to the stairs. Fix any carpet that is loose or worn. Avoid having throw rugs at the top or bottom of the stairs. If you do have throw rugs, attach them to the floor with  carpet tape. Make sure that you have a light switch at the top of the stairs and the bottom of the stairs. If you do not have them, ask someone to add them for you. What else can I do to help prevent falls? Wear shoes that: Do not have high heels. Have rubber bottoms. Are comfortable and fit you well. Are closed at the toe. Do not wear sandals. If you use a stepladder: Make sure that it is fully opened. Do not climb a closed stepladder. Make sure that both sides of the stepladder are locked into place. Ask someone to hold it for you, if possible. Clearly mark and make sure that you can see: Any grab bars or handrails. First and last steps. Where the edge of each step is. Use tools that help you move  around (mobility aids) if they are needed. These include: Canes. Walkers. Scooters. Crutches. Turn on the lights when you go into a dark area. Replace any light bulbs as soon as they burn out. Set up your furniture so you have a clear path. Avoid moving your furniture around. If any of your floors are uneven, fix them. If there are any pets around you, be aware of where they are. Review your medicines with your doctor. Some medicines can make you feel dizzy. This can increase your chance of falling. Ask your doctor what other things that you can do to help prevent falls. This information is not intended to replace advice given to you by your health care provider. Make sure you discuss any questions you have with your health care provider. Document Released: 06/21/2009 Document Revised: 01/31/2016 Document Reviewed: 09/29/2014 Elsevier Interactive Patient Education  2017 [employer].

## 2021-07-26 ENCOUNTER — Other Ambulatory Visit: Payer: Self-pay | Admitting: Interventional Cardiology

## 2021-07-26 ENCOUNTER — Other Ambulatory Visit: Payer: Self-pay | Admitting: Oncology

## 2021-07-29 DIAGNOSIS — M47812 Spondylosis without myelopathy or radiculopathy, cervical region: Secondary | ICD-10-CM | POA: Diagnosis not present

## 2021-08-04 NOTE — Progress Notes (Signed)
Cardiology Office Note:    Date:  08/05/2021   ID:  Larry Bautista, DOB June 19, 1941, MRN [id number]  PCP:  [name], MD  Cardiologist:  Sinclair Grooms, MD   Referring MD: [name], MD   Chief Complaint  Patient presents with   Atrial Fibrillation   Congestive Heart Failure    History of Present Illness:    Larry Bautista is a 80 y.o. male with a hx of CAD, chronic atrial fibrillation, anticoagulation therapy, hypertension, and chronic diastolic heart failure.    He is having increasing dyspnea on exertion, lower extremity swelling, no chest pain, and denies orthopnea.  Dyspnea is preventing certain activities.  He denies palpitations and syncope/near syncope.  Past Medical History:  Diagnosis Date   Allergy    CHF (congestive heart failure) (HCC)    Chronic knee pain    Coronary artery disease    with LAD DE stent 2004   Dysplastic nevus 03/26/2016   right mid med calf   Dysrhythmia    Heel spur, left    Hyperlipidemia    Hypertension    Kidney stones    Lower back pain    status post surgery for spondylolisthesis   Migraine    Persistent atrial fibrillation ([place])    longstanding persistent (since 05/2008)   Plantar fasciitis     Past Surgical History:  Procedure Laterality Date   BACK SURGERY     spondylolisthesis   CARDIAC CATHETERIZATION  03/06/2003   CORONARY ANGIOPLASTY WITH STENT PLACEMENT  04/2003   CYPHER stent implantation in the LAD   ESOPHAGOGASTRODUODENOSCOPY (EGD) WITH PROPOFOL N/A 01/07/2018   Procedure: ESOPHAGOGASTRODUODENOSCOPY (EGD) WITH PROPOFOL;  Surgeon: Wonda Horner, MD;  Location: [facility] ENDOSCOPY;  Service: Endoscopy;  Laterality: N/A;   HAND SURGERY Right 2014   carpal tunnel   INGUINAL HERNIA REPAIR Right 04/26/2020   Procedure: RIGHT INGUINAL HERNIA REPAIR WITH MESH;  Surgeon: Coralie Keens, MD;  Location: [place];  Service: General;  Laterality: Right;   SKIN CANCER EXCISION  1980's   Face    VASCULAR SURGERY     Vascular Stent    Current Medications: Current Meds  Medication Sig   acetaminophen (TYLENOL) 325 MG tablet Take 2 tablets (650 mg total) by mouth QID. (Patient taking differently: Take 325 mg by mouth at bedtime.)   albuterol (PROAIR HFA) 108 (90 Base) MCG/ACT inhaler Inhale 1-2 puffs into the lungs every 6 (six) hours as needed for wheezing or shortness of breath.   Calcium 200 MG TABS Take by mouth.   cetirizine (ZYRTEC) 10 MG tablet Take 10 mg by mouth daily.   diclofenac Sodium (VOLTAREN) 1 % GEL Apply topically 4 (four) times daily.   diphenhydramine-acetaminophen (TYLENOL PM) 25-500 MG TABS tablet Take 1 tablet by mouth at bedtime as needed.   empagliflozin (JARDIANCE) 10 MG TABS tablet Take 1 tablet (10 mg total) by mouth daily before breakfast.   eplerenone (INSPRA) 25 MG tablet TAKE ONE (1) TABLET EACH DAY   fluticasone (FLONASE) 50 MCG/ACT nasal spray USE 2 SPRAYS IN EACH NOSTRIL DAILY   gabapentin (NEURONTIN) 100 MG capsule Take 100 mg by mouth daily.   HYDROcodone-acetaminophen (NORCO/VICODIN) 5-325 MG tablet Take 1 tablet by mouth every 6 (six) hours as needed for moderate pain.   nitroGLYCERIN (NITROSTAT) 0.4 MG SL tablet Place 1 tablet (0.4 mg total) under the tongue every 5 (five) minutes as needed.   oxyCODONE-acetaminophen (PERCOCET) 5-325 MG tablet Take  1 tablet by mouth every 6 (six) hours as needed.   pantoprazole (PROTONIX) 40 MG tablet TAKE ONE (1) TABLET EACH DAY   psyllium (METAMUCIL) 58.6 % powder Take 1 packet by mouth daily.    rosuvastatin (CRESTOR) 20 MG tablet TAKE ONE (1) TABLET EACH DAY   tamsulosin (FLOMAX) 0.4 MG CAPS capsule Take 0.4 mg by mouth daily after supper.   tiZANidine (ZANAFLEX) 4 MG tablet Take 4 mg by mouth daily.   traMADol (ULTRAM) 50 MG tablet Take 1 tablet (50 mg total) by mouth every 6 (six) hours as needed for severe pain.   vitamin C (ASCORBIC ACID) 500 MG tablet Take 500 mg by mouth daily.   warfarin  (COUMADIN) 2.5 MG tablet Take 1.25mg  (1/2 tablet) on Sunday, Tuesday, Thursday, and Saturday or as directed by Anticoagulation Clinic.   warfarin (COUMADIN) 5 MG tablet TAKE 1/2 TABLET ON MONDAY, WEDNESDAY ANDFRIDAY OR AS DIRECTED BY COUMADIN CLINIC   [DISCONTINUED] gabapentin (NEURONTIN) 300 MG capsule Take 300 mg by mouth every other day.    [DISCONTINUED] metoprolol succinate (TOPROL-XL) 25 MG 24 hr tablet TAKE ONE (1) TABLET EACH DAY     Allergies:   Celecoxib, Lisinopril, and Simvastatin   Social History   Socioeconomic History   Marital status: Married    Spouse name: Darryel Diodato   Number of children: 1   Years of education: Not on file   Highest education level: Bachelor's degree (e.g., BA, AB, BS)  Occupational History   Occupation: retired  Tobacco Use   Smoking status: Former    Packs/day: 2.00    Years: 35.00    Pack years: 70.00    Types: Cigarettes    Start date: 09/08/1954    Quit date: 11/06/1989    Years since quitting: 31.7   Smokeless tobacco: Never   Tobacco comments:    Smoked from [phone] (35 years)  Vaping Use   Vaping Use: Never used  Substance and Sexual Activity   Alcohol use: Not Currently    Alcohol/week: 2.0 standard drinks    Types: 2 Standard drinks or equivalent per week    Comment: 1961-2009 (Last drink 2009)    Drug use: No   Sexual activity: Not on file  Other Topics Concern   Not on file  Social History Narrative   Retired Insurance underwriter. Married in 2015. Has one daughter, and 4 grandchildren. He lives in [place].   Social Determinants of Health   Financial Resource Strain: Low Risk    Difficulty of Paying Living Expenses: Not hard at all  Food Insecurity: No Food Insecurity   Worried About Charity fundraiser in the Last Year: Never true   [place] in the Last Year: Never true  Transportation Needs: No Transportation Needs   Lack of Transportation (Medical): No   Lack of Transportation (Non-Medical): No  Physical  Activity: Unknown   Days of Exercise per Week: Not on file   Minutes of Exercise per Session: 0 min  Stress: No Stress Concern Present   Feeling of Stress : Not at all  Social Connections: Moderately Isolated   Frequency of Communication with Friends and Family: Once a week   Frequency of Social Gatherings with Friends and Family: Never   Attends Religious Services: More than 4 times per year   Active Member of [employer] or Organizations: No   Attends Archivist Meetings: Never   Marital Status: Married     Family History: The patient's  family history includes Atrial fibrillation in his brother and brother; Bladder Cancer in his brother; COPD in his brother; Colon cancer in his father; Congestive Heart Failure in his brother and father; Heart attack in his father; Lung cancer in his brother and mother; Migraines in his daughter; Stroke in his brother.  ROS:   Please see the history of present illness.    He has exertional intolerance.  All other systems reviewed and are negative.  EKGs/Labs/Other Studies Reviewed:    The following studies were reviewed today: No recent imaging.  Needs an echocardiogram.  EKG:  EKG atrial fibrillation with slow ventricular response of 50 bpm.  Low voltage.  Nonspecific T wave flattening.  When compared to prior EKG from April 02, 2020, the heart rate is slower.  Recent Labs: 06/25/2021: BUN 11; Creatinine, Ser 0.77; Hemoglobin 14.4; Platelets 246; Potassium 4.8; Sodium 141  Recent Lipid Panel    Component Value Date/Time   CHOL 128 07/24/2020 1336   TRIG 101 07/24/2020 1336   HDL 49 07/24/2020 1336   CHOLHDL 2.6 07/24/2020 1336   CHOLHDL 3 01/24/2015 0926   VLDL 26.8 01/24/2015 0926   LDLCALC 60 07/24/2020 1336   LDLDIRECT 136.6 08/24/2013 0938    Physical Exam:    VS:  BP 112/72   Pulse (!) 50   Ht 5\' 7"  (1.702 m)   Wt 186 lb 9.6 oz (84.6 kg)   SpO2 96%   BMI 29.23 kg/m     Wt Readings from Last 3 Encounters:  08/05/21 186  lb 9.6 oz (84.6 kg)  07/25/21 193 lb 9.6 oz (87.8 kg)  12/18/20 187 lb (84.8 kg)     GEN: Compatible with age. No acute distress HEENT: Normal NECK: No JVD. LYMPHATICS: No lymphadenopathy CARDIAC: No murmur. RRR no gallop, or edema. VASCULAR:  Normal Pulses. No bruits. RESPIRATORY:  Clear to auscultation without rales, wheezing or rhonchi  ABDOMEN: Soft, non-tender, non-distended, No pulsatile mass, MUSCULOSKELETAL: No deformity  SKIN: Warm and dry NEUROLOGIC:  Alert and oriented x 3 PSYCHIATRIC:  Normal affect   ASSESSMENT:    1. Permanent atrial fibrillation ([place])   2. CAD in native artery   3. Essential hypertension   4. Other hyperlipidemia   5. Current use of long term anticoagulation   6. SOB (shortness of breath)   7. Chronic diastolic heart failure (HCC)    PLAN:    In order of problems listed above:  Slow ventricular response.  Rule out chronotropic incompetence.  Decrease Toprol-XL to 12.5 mg daily or 25 mg Monday, Wednesday, and Friday.  We will likely need to be discontinued when next seen if heart rate is less than 60. Secondary prevention discussed.  Not having active angina. Blood pressure is relatively low.  Decreasing the metoprolol will help support blood pressure which needs to be a bit higher at his age. Continue Crestor 20 mg/day. Continue warfarin and follow-up in Coumadin clinic. Suspect diastolic heart failure.  Start Jardiance 10 mg/day.  2D Doppler echocardiogram and basic metabolic panel in 2 to 4 weeks.  Clinical follow-up in 6 to 8 weeks.  If edema and dyspnea has improved, will continue Jardiance.  Further recommendations may be made after echo is assessed.    Medication Adjustments/Labs and Tests Ordered: Current medicines are reviewed at length with the patient today.  Concerns regarding medicines are outlined above.  Orders Placed This Encounter  Procedures   Pro b natriuretic peptide   Basic metabolic panel   EKG [id number]  ECHOCARDIOGRAM COMPLETE   Meds ordered this encounter  Medications   metoprolol succinate (TOPROL-XL) 25 MG 24 hr tablet    Sig: Take 1 tablet (25 mg total) by mouth every Monday, Wednesday, and Friday.    Dispense:  45 tablet    Refill:  3   empagliflozin (JARDIANCE) 10 MG TABS tablet    Sig: Take 1 tablet (10 mg total) by mouth daily before breakfast.    Dispense:  30 tablet    Refill:  11    Patient Instructions  Medication Instructions:  1) DECREASE Metoprolol Succinate to 25mg  once daily on Monday, Wednesday and Friday.  *If you need a refill on your cardiac medications before your next appointment, please call your pharmacy*   Lab Work:  If you have labs (blood work) drawn today and your tests are completely normal, you will receive your results only by: [place] (if you have MyChart) OR A paper copy in the mail If you have any lab test that is abnormal or we need to change your treatment, we will call you to review the results.   Testing/Procedures:    Follow-Up: At [facility], you and your health needs are our priority.  As part of our continuing mission to provide you with exceptional heart care, we have created designated Provider Care Teams.  These Care Teams include your primary Cardiologist (physician) and Advanced Practice Providers (APPs -  Physician Assistants and Nurse Practitioners) who all work together to provide you with the care you need, when you need it.  We recommend signing up for the patient portal called "MyChart".  Sign up information is provided on this After Visit Summary.  MyChart is used to connect with patients for Virtual Visits (Telemedicine).  Patients are able to view lab/test results, encounter notes, upcoming appointments, etc.  Non-urgent messages can be sent to your provider as well.   To learn more about what you can do with MyChart, go to [email or web address].    Your next appointment:   6 week(s)  The format for your  next appointment:   In Person   Signed, Sinclair Grooms, MD  08/05/2021 4:31 PM    [place]

## 2021-08-05 ENCOUNTER — Other Ambulatory Visit: Payer: Self-pay

## 2021-08-05 ENCOUNTER — Ambulatory Visit (INDEPENDENT_AMBULATORY_CARE_PROVIDER_SITE_OTHER): Payer: Medicare Other | Admitting: *Deleted

## 2021-08-05 ENCOUNTER — Encounter: Payer: Self-pay | Admitting: Interventional Cardiology

## 2021-08-05 ENCOUNTER — Ambulatory Visit (INDEPENDENT_AMBULATORY_CARE_PROVIDER_SITE_OTHER): Payer: Medicare Other | Admitting: Interventional Cardiology

## 2021-08-05 VITALS — BP 112/72 | HR 50 | Ht 67.0 in | Wt 186.6 lb

## 2021-08-05 DIAGNOSIS — I5032 Chronic diastolic (congestive) heart failure: Secondary | ICD-10-CM | POA: Diagnosis not present

## 2021-08-05 DIAGNOSIS — I10 Essential (primary) hypertension: Secondary | ICD-10-CM | POA: Diagnosis not present

## 2021-08-05 DIAGNOSIS — E7849 Other hyperlipidemia: Secondary | ICD-10-CM | POA: Diagnosis not present

## 2021-08-05 DIAGNOSIS — R0602 Shortness of breath: Secondary | ICD-10-CM

## 2021-08-05 DIAGNOSIS — I4821 Permanent atrial fibrillation: Secondary | ICD-10-CM | POA: Diagnosis not present

## 2021-08-05 DIAGNOSIS — Z7901 Long term (current) use of anticoagulants: Secondary | ICD-10-CM | POA: Diagnosis not present

## 2021-08-05 DIAGNOSIS — I2510 Atherosclerotic heart disease of native coronary artery without angina pectoris: Secondary | ICD-10-CM

## 2021-08-05 DIAGNOSIS — I4891 Unspecified atrial fibrillation: Secondary | ICD-10-CM

## 2021-08-05 DIAGNOSIS — I480 Paroxysmal atrial fibrillation: Secondary | ICD-10-CM

## 2021-08-05 DIAGNOSIS — Z5181 Encounter for therapeutic drug level monitoring: Secondary | ICD-10-CM | POA: Diagnosis not present

## 2021-08-05 LAB — POCT INR: INR: 2.5 (ref 2.0–3.0)

## 2021-08-05 MED ORDER — METOPROLOL SUCCINATE ER 25 MG PO TB24: 25.0000 mg | ORAL_TABLET | ORAL | 3 refills | Status: DC

## 2021-08-05 MED ORDER — EMPAGLIFLOZIN 10 MG PO TABS: 10.0000 mg | ORAL_TABLET | Freq: Every day | ORAL | 11 refills | Status: DC

## 2021-08-05 NOTE — Patient Instructions (Signed)
Description   Continue taking warfarin 1.25 mg daily except  2.5 mg on Mondays, Wednesdays and Fridays. Recheck INR in 6 weeks. Call with any new medications or procedure/surgery dates. [facility] # [phone]. Main # [phone]. Fax number for shoulder procedure clearance [phone].

## 2021-08-05 NOTE — Patient Instructions (Signed)
Medication Instructions:  1) DECREASE Metoprolol Succinate to 25mg  once daily on Monday, Wednesday and Friday. 2) START Jardiance 10mg  once daily  *If you need a refill on your cardiac medications before your next appointment, please call your pharmacy*   Lab Work: Pro BNP today  BMET same day as echocardiogram  If you have labs (blood work) drawn today and your tests are completely normal, you will receive your results only by: [place] (if you have MyChart) OR A paper copy in the mail If you have any lab test that is abnormal or we need to change your treatment, we will call you to review the results.   Testing/Procedures: Your physician has requested that you have an echocardiogram. Echocardiography is a painless test that uses sound waves to create images of your heart. It provides your doctor with information about the size and shape of your heart and how well your heart's chambers and valves are working. This procedure takes approximately one hour. There are no restrictions for this procedure.   Follow-Up: At [facility], you and your health needs are our priority.  As part of our continuing mission to provide you with exceptional heart care, we have created designated Provider Care Teams.  These Care Teams include your primary Cardiologist (physician) and Advanced Practice Providers (APPs -  Physician Assistants and Nurse Practitioners) who all work together to provide you with the care you need, when you need it.  We recommend signing up for the patient portal called "MyChart".  Sign up information is provided on this After Visit Summary.  MyChart is used to connect with patients for Virtual Visits (Telemedicine).  Patients are able to view lab/test results, encounter notes, upcoming appointments, etc.  Non-urgent messages can be sent to your provider as well.   To learn more about what you can do with MyChart, go to [email or web address].    Your next appointment:    6-8 weeks  The format for your next appointment:   In Person  Provider:   Sinclair Grooms, MD  or APP   Other Instructions

## 2021-08-06 LAB — PRO B NATRIURETIC PEPTIDE: NT-Pro BNP: 2171 pg/mL — ABNORMAL HIGH (ref 0–486)

## 2021-08-16 DIAGNOSIS — R3911 Hesitancy of micturition: Secondary | ICD-10-CM | POA: Diagnosis not present

## 2021-08-16 DIAGNOSIS — N401 Enlarged prostate with lower urinary tract symptoms: Secondary | ICD-10-CM | POA: Diagnosis not present

## 2021-08-16 DIAGNOSIS — N281 Cyst of kidney, acquired: Secondary | ICD-10-CM | POA: Diagnosis not present

## 2021-08-19 ENCOUNTER — Other Ambulatory Visit: Payer: Self-pay | Admitting: Family Medicine

## 2021-08-23 ENCOUNTER — Other Ambulatory Visit: Payer: Medicare Other | Admitting: *Deleted

## 2021-08-23 ENCOUNTER — Other Ambulatory Visit: Payer: Self-pay

## 2021-08-23 ENCOUNTER — Ambulatory Visit (HOSPITAL_COMMUNITY): Payer: Medicare Other | Attending: Cardiovascular Disease

## 2021-08-23 DIAGNOSIS — I5032 Chronic diastolic (congestive) heart failure: Secondary | ICD-10-CM

## 2021-08-23 DIAGNOSIS — R0602 Shortness of breath: Secondary | ICD-10-CM | POA: Diagnosis not present

## 2021-08-23 LAB — ECHOCARDIOGRAM COMPLETE
Area-P 1/2: 3.99 cm2
Calc EF: 65 %
S' Lateral: 3 cm
Single Plane A2C EF: 64.8 %
Single Plane A4C EF: 64.6 %

## 2021-08-23 LAB — BASIC METABOLIC PANEL
BUN/Creatinine Ratio: 16 (ref 10–24)
BUN: 12 mg/dL (ref 8–27)
CO2: 24 mmol/L (ref 20–29)
Calcium: 9.2 mg/dL (ref 8.6–10.2)
Chloride: 106 mmol/L (ref 96–106)
Creatinine, Ser: 0.77 mg/dL (ref 0.76–1.27)
Glucose: 152 mg/dL — ABNORMAL HIGH (ref 70–99)
Potassium: 4 mmol/L (ref 3.5–5.2)
Sodium: 144 mmol/L (ref 134–144)
eGFR: 90 mL/min/{1.73_m2} (ref >59)

## 2021-08-27 DIAGNOSIS — M25511 Pain in right shoulder: Secondary | ICD-10-CM | POA: Diagnosis not present

## 2021-08-28 ENCOUNTER — Telehealth: Payer: Self-pay | Admitting: Interventional Cardiology

## 2021-08-28 NOTE — Telephone Encounter (Signed)
Larry Bautista is returning Jennifer's call from yesterday in regards to his lab results. Requesting another nurse call him back in regards to this due to Anderson Malta being out of office.

## 2021-08-28 NOTE — Telephone Encounter (Signed)
Belva Crome, MD  08/26/2021 12:41 PM EST     Let the patient know the metabolic panel is stable. A copy will be sent to [name], MD   Belva Crome, MD  08/26/2021 12:43 PM EST     Let the patient know that her echo demonstrates that heart function is normal when it comes to ability to squeeze.  Unable to identify presence or absence of diastolic dysfunction due to atrial fibrillation.  She will continue Farxiga/Jardiance to see if this helps improve dyspnea. A copy will be sent to [name], MD    The patient has been notified of the result and verbalized understanding.  All questions (if any) were answered. Antonieta Iba, RN 08/28/2021 9:00 AM

## 2021-09-13 ENCOUNTER — Other Ambulatory Visit: Payer: Self-pay | Admitting: Interventional Cardiology

## 2021-09-15 NOTE — Progress Notes (Signed)
Cardiology Office Note:    Date:  09/16/2021   ID:  Larry Bautista, DOB 27-Aug-1941, MRN [id number]  PCP:  [name], MD  Cardiologist:  Sinclair Grooms, MD   Referring MD: [name], MD   Chief Complaint  Patient presents with   Shortness of Breath   Congestive Heart Failure   Atrial Fibrillation    With slow ventricular response    History of Present Illness:    Larry Bautista is a 81 y.o. male with a hx of CAD, chronic atrial fibrillation, anticoagulation therapy, hypertension, and chronic diastolic heart failure.   He fell on the way into the office.  The wife says she asked him if he had his insurance card, he began to look into his pocket for his wallet, and fell down.  They do not feel he lost consciousness.  He did not hit his head.  He went down to his knee.  Feels his breathing may be a little better since starting Jardiance.  Past Medical History:  Diagnosis Date   Allergy    CHF (congestive heart failure) (HCC)    Chronic knee pain    Coronary artery disease    with LAD DE stent 2004   Dysplastic nevus 03/26/2016   right mid med calf   Dysrhythmia    Heel spur, left    Hyperlipidemia    Hypertension    Kidney stones    Lower back pain    status post surgery for spondylolisthesis   Migraine    Persistent atrial fibrillation ([place])    longstanding persistent (since 05/2008)   Plantar fasciitis     Past Surgical History:  Procedure Laterality Date   BACK SURGERY     spondylolisthesis   CARDIAC CATHETERIZATION  03/06/2003   CORONARY ANGIOPLASTY WITH STENT PLACEMENT  04/2003   CYPHER stent implantation in the LAD   ESOPHAGOGASTRODUODENOSCOPY (EGD) WITH PROPOFOL N/A 01/07/2018   Procedure: ESOPHAGOGASTRODUODENOSCOPY (EGD) WITH PROPOFOL;  Surgeon: Wonda Horner, MD;  Location: [facility] ENDOSCOPY;  Service: Endoscopy;  Laterality: N/A;   HAND SURGERY Right 2014   carpal tunnel   INGUINAL HERNIA REPAIR Right 04/26/2020   Procedure: RIGHT INGUINAL  HERNIA REPAIR WITH MESH;  Surgeon: Coralie Keens, MD;  Location: [facility];  Service: General;  Laterality: Right;   SKIN CANCER EXCISION  1980's   Face   VASCULAR SURGERY     Vascular Stent    Current Medications: Current Meds  Medication Sig   acetaminophen (TYLENOL) 325 MG tablet Take 2 tablets (650 mg total) by mouth QID. (Patient taking differently: Take 325 mg by mouth at bedtime.)   albuterol (PROAIR HFA) 108 (90 Base) MCG/ACT inhaler Inhale 1-2 puffs into the lungs every 6 (six) hours as needed for wheezing or shortness of breath.   cetirizine (ZYRTEC) 10 MG tablet Take 10 mg by mouth daily.   diclofenac Sodium (VOLTAREN) 1 % GEL Apply topically 4 (four) times daily.   diphenhydramine-acetaminophen (TYLENOL PM) 25-500 MG TABS tablet Take 1 tablet by mouth at bedtime as needed.   empagliflozin (JARDIANCE) 10 MG TABS tablet Take 1 tablet (10 mg total) by mouth daily before breakfast.   eplerenone (INSPRA) 25 MG tablet TAKE ONE (1) TABLET EACH DAY   fluticasone (FLONASE) 50 MCG/ACT nasal spray USE 2 SPRAYS IN EACH NOSTRIL DAILY   gabapentin (NEURONTIN) 100 MG capsule Take 100 mg by mouth daily.   HYDROcodone-acetaminophen (NORCO/VICODIN) 5-325 MG tablet Take 1 tablet by  mouth every 6 (six) hours as needed for moderate pain.   metoprolol succinate (TOPROL-XL) 25 MG 24 hr tablet Take 1 tablet (25 mg total) by mouth every Monday, Wednesday, and Friday.   nitroGLYCERIN (NITROSTAT) 0.4 MG SL tablet Place 1 tablet (0.4 mg total) under the tongue every 5 (five) minutes as needed.   oxyCODONE-acetaminophen (PERCOCET) 5-325 MG tablet Take 1 tablet by mouth every 6 (six) hours as needed.   pantoprazole (PROTONIX) 40 MG tablet TAKE ONE (1) TABLET EACH DAY   psyllium (METAMUCIL) 58.6 % powder Take 1 packet by mouth daily.    rosuvastatin (CRESTOR) 20 MG tablet TAKE ONE (1) TABLET EACH DAY   tamsulosin (FLOMAX) 0.4 MG CAPS capsule Take 0.4 mg by mouth daily after supper.    tiZANidine (ZANAFLEX) 4 MG tablet Take 4 mg by mouth daily.   traMADol (ULTRAM) 50 MG tablet Take 1 tablet (50 mg total) by mouth every 6 (six) hours as needed for severe pain.   vitamin C (ASCORBIC ACID) 500 MG tablet Take 500 mg by mouth daily.   warfarin (COUMADIN) 2.5 MG tablet Take 1.25mg  (1/2 tablet) on Sunday, Tuesday, Thursday, and Saturday or as directed by Anticoagulation Clinic.   warfarin (COUMADIN) 5 MG tablet TAKE 1/2 TABLET ON MONDAY, WEDNESDAY ANDFRIDAY OR AS DIRECTED BY COUMADIN CLINIC     Allergies:   Celecoxib, Lisinopril, and Simvastatin   Social History   Socioeconomic History   Marital status: Married    Spouse name: Pranay Hilbun   Number of children: 1   Years of education: Not on file   Highest education level: Bachelor's degree (e.g., BA, AB, BS)  Occupational History   Occupation: retired  Tobacco Use   Smoking status: Former    Packs/day: 2.00    Years: 35.00    Pack years: 70.00    Types: Cigarettes    Start date: 09/08/1954    Quit date: 11/06/1989    Years since quitting: 31.8   Smokeless tobacco: Never   Tobacco comments:    Smoked from [phone] (35 years)  Vaping Use   Vaping Use: Never used  Substance and Sexual Activity   Alcohol use: Not Currently    Alcohol/week: 2.0 standard drinks    Types: 2 Standard drinks or equivalent per week    Comment: 1961-2009 (Last drink 2009)    Drug use: No   Sexual activity: Not on file  Other Topics Concern   Not on file  Social History Narrative   Retired Insurance underwriter. Married in 2015. Has one daughter, and 4 grandchildren. He lives in [place].   Social Determinants of Health   Financial Resource Strain: Low Risk    Difficulty of Paying Living Expenses: Not hard at all  Food Insecurity: No Food Insecurity   Worried About Charity fundraiser in the Last Year: Never true   [place] in the Last Year: Never true  Transportation Needs: No Transportation Needs   Lack of Transportation (Medical):  No   Lack of Transportation (Non-Medical): No  Physical Activity: Unknown   Days of Exercise per Week: Not on file   Minutes of Exercise per Session: 0 min  Stress: No Stress Concern Present   Feeling of Stress : Not at all  Social Connections: Moderately Isolated   Frequency of Communication with Friends and Family: Once a week   Frequency of Social Gatherings with Friends and Family: Never   Attends Religious Services: More than 4 times per year  Active Member of Clubs or Organizations: No   Attends Archivist Meetings: Never   Marital Status: Married     Family History: The patient's family history includes Atrial fibrillation in his brother and brother; Bladder Cancer in his brother; COPD in his brother; Colon cancer in his father; Congestive Heart Failure in his brother and father; Heart attack in his father; Lung cancer in his brother and mother; Migraines in his daughter; Stroke in his brother.  ROS:   Please see the history of present illness.    Decreased memory.  All other systems reviewed and are negative.  EKGs/Labs/Other Studies Reviewed:    The following studies were reviewed today:  ECHOCARDIOGRAM 2022: IMPRESSIONS    1. Left ventricular ejection fraction, by estimation, is 60 to 65%. The  left ventricle has normal function. The left ventricle has no regional  wall motion abnormalities. Left ventricular diastolic parameters are  indeterminate.   2. Right ventricular systolic function is normal. The right ventricular  size is normal. There is normal pulmonary artery systolic pressure.   3. Left atrial size was moderately dilated.   4. Right atrial size was moderately dilated.   5. The mitral valve is normal in structure. Trivial mitral valve  regurgitation. No evidence of mitral stenosis.   6. The aortic valve is tricuspid. Aortic valve regurgitation is not  visualized. No aortic stenosis is present.   7. The inferior vena cava is normal in size with  greater than 50%  respiratory variability, suggesting right atrial pressure of 3 mmHg.   8. Evidence of atrial level shunting detected by color flow Doppler.  There is a moderately sized patent foramen ovale with predominantly left  to right shunting across the atrial septum.   EKG:  EKG not performed today.  Recent Labs: 06/25/2021: Hemoglobin 14.4; Platelets 246 08/05/2021: NT-Pro BNP 2,171 08/23/2021: BUN 12; Creatinine, Ser 0.77; Potassium 4.0; Sodium 144  Recent Lipid Panel    Component Value Date/Time   CHOL 128 07/24/2020 1336   TRIG 101 07/24/2020 1336   HDL 49 07/24/2020 1336   CHOLHDL 2.6 07/24/2020 1336   CHOLHDL 3 01/24/2015 0926   VLDL 26.8 01/24/2015 0926   LDLCALC 60 07/24/2020 1336   LDLDIRECT 136.6 08/24/2013 0938    Physical Exam:    VS:  BP 112/70    Pulse (!) 45    Ht 5\' 7"  (1.702 m)    Wt 181 lb 6.4 oz (82.3 kg)    SpO2 96%    BMI 28.41 kg/m     Wt Readings from Last 3 Encounters:  09/16/21 181 lb 6.4 oz (82.3 kg)  08/05/21 186 lb 9.6 oz (84.6 kg)  07/25/21 193 lb 9.6 oz (87.8 kg)     GEN: Elderly and frail. No acute distress HEENT: Normal NECK: No JVD. LYMPHATICS: No lymphadenopathy CARDIAC: No murmur. IIRR no gallop, or edema. VASCULAR:  Normal Pulses. No bruits. RESPIRATORY:  Clear to auscultation without rales, wheezing or rhonchi  ABDOMEN: Soft, non-tender, non-distended, No pulsatile mass, MUSCULOSKELETAL: No deformity  SKIN: Warm and dry NEUROLOGIC:  Alert and oriented x 3 PSYCHIATRIC:  Normal affect   ASSESSMENT:    1. Permanent atrial fibrillation ([place])   2. CAD in native artery   3. Chronic diastolic heart failure (HCC)   4. Other hyperlipidemia   5. Essential hypertension    PLAN:    In order of problems listed above:  Control to slow ventricular response.  He is now on Toprol-XL 12.5  mg/day equivalent.  We will discontinue this medication since heart rate today is 45 bpm. No symptoms of angina Breathing may be slightly  better since starting Jardiance.  Please continue 10 mg/day. Continue rosuvastatin.\ Blood pressure is low and his age.  Will allow pressure increase to the 314H to 888L systolic range.  Decrease Inspra to 25 mg Monday, Wednesday, and Friday.    Monitor blood pressure at home.  Notify us if run significantly above 579 mmHg systolic.  Notify us if any additional falls.  Monitor to no improvement in dyspnea on exertion.    Medication Adjustments/Labs and Tests Ordered: Current medicines are reviewed at length with the patient today.  Concerns regarding medicines are outlined above.  No orders of the defined types were placed in this encounter.  No orders of the defined types were placed in this encounter.   There are no Patient Instructions on file for this visit.   Signed, Sinclair Grooms, MD  09/16/2021 3:10 PM    [place]

## 2021-09-16 ENCOUNTER — Other Ambulatory Visit: Payer: Self-pay

## 2021-09-16 ENCOUNTER — Encounter: Payer: Self-pay | Admitting: Interventional Cardiology

## 2021-09-16 ENCOUNTER — Ambulatory Visit (INDEPENDENT_AMBULATORY_CARE_PROVIDER_SITE_OTHER): Payer: Medicare Other | Admitting: *Deleted

## 2021-09-16 ENCOUNTER — Ambulatory Visit (INDEPENDENT_AMBULATORY_CARE_PROVIDER_SITE_OTHER): Payer: Medicare Other | Admitting: Interventional Cardiology

## 2021-09-16 VITALS — BP 112/70 | HR 45 | Ht 67.0 in | Wt 181.4 lb

## 2021-09-16 DIAGNOSIS — I2510 Atherosclerotic heart disease of native coronary artery without angina pectoris: Secondary | ICD-10-CM | POA: Diagnosis not present

## 2021-09-16 DIAGNOSIS — E7849 Other hyperlipidemia: Secondary | ICD-10-CM | POA: Diagnosis not present

## 2021-09-16 DIAGNOSIS — I10 Essential (primary) hypertension: Secondary | ICD-10-CM | POA: Diagnosis not present

## 2021-09-16 DIAGNOSIS — I480 Paroxysmal atrial fibrillation: Secondary | ICD-10-CM

## 2021-09-16 DIAGNOSIS — I4891 Unspecified atrial fibrillation: Secondary | ICD-10-CM

## 2021-09-16 DIAGNOSIS — I5032 Chronic diastolic (congestive) heart failure: Secondary | ICD-10-CM

## 2021-09-16 DIAGNOSIS — Z5181 Encounter for therapeutic drug level monitoring: Secondary | ICD-10-CM | POA: Diagnosis not present

## 2021-09-16 DIAGNOSIS — I4821 Permanent atrial fibrillation: Secondary | ICD-10-CM

## 2021-09-16 LAB — POCT INR: INR: 2.2 (ref 2.0–3.0)

## 2021-09-16 MED ORDER — EPLERENONE 25 MG PO TABS: 25.0000 mg | ORAL_TABLET | ORAL | 3 refills | Status: DC

## 2021-09-16 MED ORDER — EPLERENONE 25 MG PO TABS: ORAL_TABLET | ORAL | 3 refills | Status: DC

## 2021-09-16 NOTE — Addendum Note (Signed)
Addended by: Loren Racer on: 09/16/2021 03:24 PM   Modules accepted: Orders

## 2021-09-16 NOTE — Patient Instructions (Signed)
Medication Instructions:  1) DISCONTINUE Metoprolol 2) DECREASE Inspra (Eplerenone) to 12.5mg  once daily  *If you need a refill on your cardiac medications before your next appointment, please call your pharmacy*   Lab Work: None If you have labs (blood work) drawn today and your tests are completely normal, you will receive your results only by: [place] (if you have MyChart) OR A paper copy in the mail If you have any lab test that is abnormal or we need to change your treatment, we will call you to review the results.   Testing/Procedures: None   Follow-Up: At [facility], you and your health needs are our priority.  As part of our continuing mission to provide you with exceptional heart care, we have created designated Provider Care Teams.  These Care Teams include your primary Cardiologist (physician) and Advanced Practice Providers (APPs -  Physician Assistants and Nurse Practitioners) who all work together to provide you with the care you need, when you need it.  We recommend signing up for the patient portal called "MyChart".  Sign up information is provided on this After Visit Summary.  MyChart is used to connect with patients for Virtual Visits (Telemedicine).  Patients are able to view lab/test results, encounter notes, upcoming appointments, etc.  Non-urgent messages can be sent to your provider as well.   To learn more about what you can do with MyChart, go to [email or web address].    Your next appointment:   3 month(s)  The format for your next appointment:   In Person  Provider:   Sinclair Grooms, MD     Other Instructions

## 2021-09-16 NOTE — Patient Instructions (Signed)
Description   Continue taking warfarin 1.25 mg daily except  2.5 mg on Mondays, Wednesdays and Fridays. Recheck INR in 6 weeks. Call with any new medications or procedure/surgery dates. [facility]in Clinic # [phone]. Main # [phone]. Fax number for shoulder procedure clearance [phone].

## 2021-10-21 DIAGNOSIS — M25511 Pain in right shoulder: Secondary | ICD-10-CM | POA: Diagnosis not present

## 2021-10-21 DIAGNOSIS — M19011 Primary osteoarthritis, right shoulder: Secondary | ICD-10-CM | POA: Diagnosis not present

## 2021-10-29 ENCOUNTER — Ambulatory Visit (INDEPENDENT_AMBULATORY_CARE_PROVIDER_SITE_OTHER): Payer: Medicare Other | Admitting: *Deleted

## 2021-10-29 ENCOUNTER — Other Ambulatory Visit: Payer: Self-pay

## 2021-10-29 DIAGNOSIS — I4891 Unspecified atrial fibrillation: Secondary | ICD-10-CM

## 2021-10-29 DIAGNOSIS — I480 Paroxysmal atrial fibrillation: Secondary | ICD-10-CM

## 2021-10-29 DIAGNOSIS — Z5181 Encounter for therapeutic drug level monitoring: Secondary | ICD-10-CM | POA: Diagnosis not present

## 2021-10-29 LAB — POCT INR: INR: 2.3 (ref 2.0–3.0)

## 2021-10-29 NOTE — Patient Instructions (Signed)
Description   Continue taking warfarin 1.25 mg daily except  2.5 mg on Mondays, Wednesdays and Fridays. Recheck INR in 6 weeks. Call with any new medications or procedure/surgery dates. Coumadin Clinic # [phone]. Main # [phone]. Fax number for shoulder procedure clearance [phone].

## 2021-12-11 ENCOUNTER — Ambulatory Visit (INDEPENDENT_AMBULATORY_CARE_PROVIDER_SITE_OTHER): Payer: Medicare Other | Admitting: Dermatology

## 2021-12-11 DIAGNOSIS — I2510 Atherosclerotic heart disease of native coronary artery without angina pectoris: Secondary | ICD-10-CM

## 2021-12-11 DIAGNOSIS — L820 Inflamed seborrheic keratosis: Secondary | ICD-10-CM | POA: Diagnosis not present

## 2021-12-11 DIAGNOSIS — L821 Other seborrheic keratosis: Secondary | ICD-10-CM

## 2021-12-11 DIAGNOSIS — D229 Melanocytic nevi, unspecified: Secondary | ICD-10-CM | POA: Diagnosis not present

## 2021-12-11 DIAGNOSIS — D1800 Hemangioma unspecified site: Secondary | ICD-10-CM

## 2021-12-11 DIAGNOSIS — L814 Other melanin hyperpigmentation: Secondary | ICD-10-CM | POA: Diagnosis not present

## 2021-12-11 DIAGNOSIS — L578 Other skin changes due to chronic exposure to nonionizing radiation: Secondary | ICD-10-CM

## 2021-12-11 DIAGNOSIS — Z1283 Encounter for screening for malignant neoplasm of skin: Secondary | ICD-10-CM | POA: Diagnosis not present

## 2021-12-11 NOTE — Patient Instructions (Addendum)

## 2021-12-11 NOTE — Progress Notes (Signed)
? ?  Follow-Up Visit ?  ?Subjective  ?Larry Bautista is a 81 y.o. male who presents for the following: Annual Exam (6 months f/u ). Hx of ISks ?The patient presents for Upper Body Skin Exam (UBSE) for skin cancer screening and mole check.  The patient has spots, moles and lesions to be evaluated, some may be new or changing and the patient has concerns that these could be cancer.  ? ?The following portions of the chart were reviewed this encounter and updated as appropriate:  ? Tobacco  Allergies  Meds  Problems  Med Hx  Surg Hx  Fam Hx   ?  ?Review of Systems:  No other skin or systemic complaints except as noted in HPI or Assessment and Plan. ? ?Objective  ?Well appearing patient in no apparent distress; mood and affect are within normal limits. ? ?All skin waist up examined. ? ?left forearm, right abdomen  (2) (2) ?Stuck-on, waxy, tan-brown papules ? ? ?Assessment & Plan  ?Inflamed seborrheic keratosis (2) ?left forearm, right abdomen  (2) ? ?Reassured benign age-related growth.  Recommend observation.  Discussed cryotherapy if spot(s) become irritated or inflamed.  ? ?Destruction of lesion - left forearm, right abdomen  (2) ?Complexity: simple   ?Destruction method: cryotherapy   ?Informed consent: discussed and consent obtained   ?Timeout:  patient name, date of birth, surgical site, and procedure verified ?Lesion destroyed using liquid nitrogen: Yes   ?Region frozen until ice ball extended beyond lesion: Yes   ?Outcome: patient tolerated procedure well with no complications   ?Post-procedure details: wound care instructions given   ? ?Skin cancer screening ? ?Lentigines ?- Scattered tan macules ?- Due to sun exposure ?- Benign-appearing, observe ?- Recommend daily broad spectrum sunscreen SPF 30+ to sun-exposed areas, reapply every 2 hours as needed. ?- Call for any changes ? ?Seborrheic Keratoses ?- Stuck-on, waxy, tan-brown papules and/or plaques  ?- Benign-appearing ?- Discussed benign etiology and  prognosis. ?- Observe ?- Call for any changes ? ?Melanocytic Nevi ?- Tan-brown and/or pink-flesh-colored symmetric macules and papules ?- Benign appearing on exam today ?- Observation ?- Call clinic for new or changing moles ?- Recommend daily use of broad spectrum spf 30+ sunscreen to sun-exposed areas.  ? ?Hemangiomas ?- Red papules ?- Discussed benign nature ?- Observe ?- Call for any changes ? ?Actinic Damage ?- Chronic condition, secondary to cumulative UV/sun exposure ?- diffuse scaly erythematous macules with underlying dyspigmentation ?- Recommend daily broad spectrum sunscreen SPF 30+ to sun-exposed areas, reapply every 2 hours as needed.  ?- Staying in the shade or wearing long sleeves, sun glasses (UVA+UVB protection) and wide brim hats (4-inch brim around the entire circumference of the hat) are also recommended for sun protection.  ?- Call for new or changing lesions. ? ?Skin cancer screening performed today.  ? ?Return in about 1 year (around 12/12/2022) for TBSE, hx of Dysplastic nevus, hx of ISKs. ? ?I, Larry Bautista, CMA, am acting as scribe for Larry Ser, MD .  ?Documentation: I have reviewed the above documentation for accuracy and completeness, and I agree with the above. ? ?Larry Ser, MD ? ?

## 2021-12-12 ENCOUNTER — Encounter: Payer: Self-pay | Admitting: Dermatology

## 2021-12-17 ENCOUNTER — Other Ambulatory Visit: Payer: Self-pay | Admitting: Interventional Cardiology

## 2021-12-17 NOTE — Progress Notes (Signed)
?Cardiology Office Note:   ? ?Date:  12/18/2021  ? ?ID:  Larry Bautista, DOB 1941/01/05, MRN [id number] ? ?PCP:  [name], MD  ?Cardiologist:  Sinclair Grooms, MD  ? ?Referring MD: [name], MD  ? ?Chief Complaint  ?Patient presents with  ? Atrial Fibrillation  ? Congestive Heart Failure  ? ? ?History of Present Illness:   ? ?Larry Bautista is a 81 y.o. male with a hx of CAD, chronic atrial fibrillation, anticoagulation therapy, hypertension, and chronic diastolic heart failure.  ? ? ?He is back today for follow-up after adding SGLT2 therapy.  This was started because of exertional dyspnea and suspicion of diastolic heart failure. ? ?He is doing about the same.  He gets short of breath if he walks upstairs.  This is not a change.  No change in ability to walk around on flat surfaces.  There is no orthopnea.  No side effect from medication.  No angina. ? ?Past Medical History:  ?Diagnosis Date  ? Allergy   ? CHF (congestive heart failure) ([place])   ? Chronic knee pain   ? Coronary artery disease   ? with LAD DE stent 2004  ? Dysplastic nevus 03/26/2016  ? right mid med calf  ? Dysrhythmia   ? Heel spur, left   ? Hyperlipidemia   ? Hypertension   ? Kidney stones   ? Lower back pain   ? status post surgery for spondylolisthesis  ? Migraine   ? Persistent atrial fibrillation ([place])   ? longstanding persistent (since 05/2008)  ? Plantar fasciitis   ? ? ?Past Surgical History:  ?Procedure Laterality Date  ? BACK SURGERY    ? spondylolisthesis  ? CARDIAC CATHETERIZATION  03/06/2003  ? CORONARY ANGIOPLASTY WITH STENT PLACEMENT  04/2003  ? CYPHER stent implantation in the LAD  ? ESOPHAGOGASTRODUODENOSCOPY (EGD) WITH PROPOFOL N/A 01/07/2018  ? Procedure: ESOPHAGOGASTRODUODENOSCOPY (EGD) WITH PROPOFOL;  Surgeon: Wonda Horner, MD;  [facility] ENDOSCOPY;  Service: Endoscopy;  Laterality: N/A;  ? HAND SURGERY Right 2014  ? carpal tunnel  ? INGUINAL HERNIA REPAIR Right 04/26/2020  ? Procedure: RIGHT INGUINAL  HERNIA REPAIR WITH MESH;  Surgeon: Coralie Keens, MD;  Location: [place];  Service: General;  Laterality: Right;  ? SKIN CANCER EXCISION  1980's  ? Face  ? VASCULAR SURGERY    ? Vascular Stent  ? ? ?Current Medications: ?Current Meds  ?Medication Sig  ? acetaminophen (TYLENOL) 325 MG tablet Take 2 tablets (650 mg total) by mouth QID. (Patient taking differently: Take 325 mg by mouth at bedtime.)  ? cetirizine (ZYRTEC) 10 MG tablet Take 10 mg by mouth daily.  ? diclofenac Sodium (VOLTAREN) 1 % GEL Apply topically 4 (four) times daily.  ? diphenhydramine-acetaminophen (TYLENOL PM) 25-500 MG TABS tablet Take 1 tablet by mouth at bedtime as needed.  ? empagliflozin (JARDIANCE) 10 MG TABS tablet Take 1 tablet (10 mg total) by mouth daily before breakfast.  ? eplerenone (INSPRA) 25 MG tablet Take 1 tablet (25 mg total) by mouth every Monday, Wednesday, and Friday.  ? fluticasone (FLONASE) 50 MCG/ACT nasal spray USE 2 SPRAYS IN EACH NOSTRIL DAILY  ? gabapentin (NEURONTIN) 100 MG capsule Take 100 mg by mouth daily.  ? nitroGLYCERIN (NITROSTAT) 0.4 MG SL tablet Place 1 tablet (0.4 mg total) under the tongue every 5 (five) minutes as needed.  ? pantoprazole (PROTONIX) 40 MG tablet TAKE ONE (1) TABLET EACH DAY  ? psyllium (  METAMUCIL) 58.6 % powder Take 1 packet by mouth daily.   ? rosuvastatin (CRESTOR) 20 MG tablet TAKE ONE (1) TABLET EACH DAY  ? tamsulosin (FLOMAX) 0.4 MG CAPS capsule Take 0.4 mg by mouth daily after supper.  ? tiZANidine (ZANAFLEX) 4 MG tablet Take 4 mg by mouth daily.  ? vitamin C (ASCORBIC ACID) 500 MG tablet Take 500 mg by mouth daily.  ? warfarin (COUMADIN) 2.5 MG tablet TAKE 1/2 TABLET ON SATURDAYS, SUNDAYS, TUESDAYS AND THURSDAYS OR AS DIRECTED BY COUMADIN CLINIC  ? warfarin (COUMADIN) 5 MG tablet TAKE 1/2 TABLET ON MONDAY, WEDNESDAY ANDFRIDAY OR AS DIRECTED BY COUMADIN CLINIC  ?  ? ?Allergies:   Celecoxib, Lisinopril, and Simvastatin  ? ?Social History  ? ?Socioeconomic History   ? Marital status: Married  ?  Spouse name: Aziz Slape  ? Number of children: 1  ? Years of education: Not on file  ? Highest education level: Bachelor's degree (e.g., BA, AB, BS)  ?Occupational History  ? Occupation: retired  ?Tobacco Use  ? Smoking status: Former  ?  Packs/day: 2.00  ?  Years: 35.00  ?  Pack years: 70.00  ?  Types: Cigarettes  ?  Start date: 09/08/1954  ?  Quit date: 11/06/1989  ?  Years since quitting: 32.1  ? Smokeless tobacco: Never  ? Tobacco comments:  ?  Smoked from [phone] (35 years)  ?Vaping Use  ? Vaping Use: Never used  ?Substance and Sexual Activity  ? Alcohol use: Not Currently  ?  Alcohol/week: 2.0 standard drinks  ?  Types: 2 Standard drinks or equivalent per week  ?  Comment: [id number] (Last drink 2009)   ? Drug use: No  ? Sexual activity: Not on file  ?Other Topics Concern  ? Not on file  ?Social History Narrative  ? Retired Insurance underwriter. Married in 2015. Has one daughter, and 4 grandchildren. He lives in [place].  ? ?Social Determinants of Health  ? ?Financial Resource Strain: Low Risk   ? Difficulty of Paying Living Expenses: Not hard at all  ?Food Insecurity: No Food Insecurity  ? Worried About Charity fundraiser in the Last Year: Never true  ? Ran Out of Food in the Last Year: Never true  ?Transportation Needs: No Transportation Needs  ? Lack of Transportation (Medical): No  ? Lack of Transportation (Non-Medical): No  ?Physical Activity: Unknown  ? Days of Exercise per Week: Not on file  ? Minutes of Exercise per Session: 0 min  ?Stress: No Stress Concern Present  ? Feeling of Stress : Not at all  ?Social Connections: Moderately Isolated  ? Frequency of Communication with Friends and Family: Once a week  ? Frequency of Social Gatherings with Friends and Family: Never  ? Attends Religious Services: More than 4 times per year  ? Active Member of Clubs or Organizations: No  ? Attends Archivist Meetings: Never  ? Marital Status: Married  ?  ? ?Family History: ?The  patient's family history includes Atrial fibrillation in his brother and brother; Bladder Cancer in his brother; COPD in his brother; Colon cancer in his father; Congestive Heart Failure in his brother and father; Heart attack in his father; Lung cancer in his brother and mother; Migraines in his daughter; Stroke in his brother. ? ?ROS:   ?Please see the history of present illness.    ?Appetite is stable.  All other systems reviewed and are negative. ? ?EKGs/Labs/Other Studies Reviewed:   ? ?The  following studies were reviewed today: ?No new data ? ?EKG:  EKG not repeated ? ?Recent Labs: ?06/25/2021: Hemoglobin 14.4; Platelets 246 ?08/05/2021: NT-Pro BNP 2,171 ?08/23/2021: BUN 12; Creatinine, Ser 0.77; Potassium 4.0; Sodium 144  ?Recent Lipid Panel ?   ?Component Value Date/Time  ? CHOL 128 07/24/2020 1336  ? TRIG 101 07/24/2020 1336  ? HDL 49 07/24/2020 1336  ? CHOLHDL 2.6 07/24/2020 1336  ? CHOLHDL 3 01/24/2015 0926  ? VLDL 26.8 01/24/2015 0926  ? [place] 60 07/24/2020 1336  ? LDLDIRECT 136.6 08/24/2013 0938  ? ? ?Physical Exam:   ? ?VS:  BP 98/62   Pulse 62   Ht '5\' 8"'$  (1.727 m)   Wt 175 lb 3.2 oz (79.5 kg)   SpO2 98%   BMI 26.64 kg/m?    ? ?Wt Readings from Last 3 Encounters:  ?12/18/21 175 lb 3.2 oz (79.5 kg)  ?09/16/21 181 lb 6.4 oz (82.3 kg)  ?08/05/21 186 lb 9.6 oz (84.6 kg)  ?  ? ?GEN: Appears healthy. No acute distress ?HEENT: Normal ?NECK: No JVD. ?LYMPHATICS: No lymphadenopathy ?CARDIAC: No murmur. IIRR no gallop, or edema. ?VASCULAR:  Normal Pulses. No bruits. ?RESPIRATORY:  Clear to auscultation without rales, wheezing or rhonchi  ?ABDOMEN: Soft, non-tender, non-distended, No pulsatile mass, ?MUSCULOSKELETAL: No deformity  ?SKIN: Warm and dry ?NEUROLOGIC:  Alert and oriented x 3 ?PSYCHIATRIC:  Normal affect  ? ?ASSESSMENT:   ? ?1. Permanent atrial fibrillation ([place])   ?2. Chronic diastolic heart failure ([place])   ?3. CAD in native artery   ?4. Essential hypertension   ?5. Encounter for therapeutic  drug monitoring   ?6. Other hyperlipidemia   ? ?PLAN:   ? ?In order of problems listed above: ? ?Persistent and present today based upon exam. ?Discontinue Farxiga/Jardiance as it did not bring about any symptomatic

## 2021-12-18 ENCOUNTER — Ambulatory Visit (INDEPENDENT_AMBULATORY_CARE_PROVIDER_SITE_OTHER): Payer: Medicare Other

## 2021-12-18 ENCOUNTER — Encounter: Payer: Self-pay | Admitting: Interventional Cardiology

## 2021-12-18 ENCOUNTER — Ambulatory Visit (INDEPENDENT_AMBULATORY_CARE_PROVIDER_SITE_OTHER): Payer: Medicare Other | Admitting: Interventional Cardiology

## 2021-12-18 VITALS — BP 98/62 | HR 62 | Ht 68.0 in | Wt 175.2 lb

## 2021-12-18 DIAGNOSIS — I4891 Unspecified atrial fibrillation: Secondary | ICD-10-CM | POA: Diagnosis not present

## 2021-12-18 DIAGNOSIS — I10 Essential (primary) hypertension: Secondary | ICD-10-CM

## 2021-12-18 DIAGNOSIS — E7849 Other hyperlipidemia: Secondary | ICD-10-CM

## 2021-12-18 DIAGNOSIS — Z5181 Encounter for therapeutic drug level monitoring: Secondary | ICD-10-CM

## 2021-12-18 DIAGNOSIS — I480 Paroxysmal atrial fibrillation: Secondary | ICD-10-CM | POA: Diagnosis not present

## 2021-12-18 DIAGNOSIS — I4821 Permanent atrial fibrillation: Secondary | ICD-10-CM

## 2021-12-18 DIAGNOSIS — I2510 Atherosclerotic heart disease of native coronary artery without angina pectoris: Secondary | ICD-10-CM | POA: Diagnosis not present

## 2021-12-18 DIAGNOSIS — I5032 Chronic diastolic (congestive) heart failure: Secondary | ICD-10-CM | POA: Diagnosis not present

## 2021-12-18 LAB — POCT INR: INR: 3 (ref 2.0–3.0)

## 2021-12-18 NOTE — Patient Instructions (Signed)
Medication Instructions:  ?1) DISCONTINUE Jardiance ? ?*If you need a refill on your cardiac medications before your next appointment, please call your pharmacy* ? ? ?Lab Work: ?None ?If you have labs (blood work) drawn today and your tests are completely normal, you will receive your results only by: ?MyChart Message (if you have MyChart) OR ?A paper copy in the mail ?If you have any lab test that is abnormal or we need to change your treatment, we will call you to review the results. ? ? ?Testing/Procedures: ?None ? ? ?Follow-Up: ?At [facility], you and your health needs are our priority.  As part of our continuing mission to provide you with exceptional heart care, we have created designated Provider Care Teams.  These Care Teams include your primary Cardiologist (physician) and Advanced Practice Providers (APPs -  Physician Assistants and Nurse Practitioners) who all work together to provide you with the care you need, when you need it. ? ?We recommend signing up for the patient portal called "MyChart".  Sign up information is provided on this After Visit Summary.  MyChart is used to connect with patients for Virtual Visits (Telemedicine).  Patients are able to view lab/test results, encounter notes, upcoming appointments, etc.  Non-urgent messages can be sent to your provider as well.   ?To learn more about what you can do with MyChart, go to [email or web address].   ? ?Your next appointment:   ?9-12 month(s) ? ?The format for your next appointment:   ?In Person ? ?Provider:   ?Sinclair Grooms, MD  ? ? ?Other Instructions ? ? ?Important Information About Sugar ? ? ? ? ?  ?

## 2021-12-18 NOTE — Patient Instructions (Signed)
Description   ?Continue taking warfarin 1.25 mg daily except  2.5 mg on Mondays, Wednesdays and Fridays. Recheck INR in 6 weeks. Call with any new medications or procedure/surgery dates. [facility] # [phone]. Main # [phone]. Fax number for shoulder procedure clearance [phone].  ?  ?  ?

## 2022-01-28 ENCOUNTER — Ambulatory Visit (INDEPENDENT_AMBULATORY_CARE_PROVIDER_SITE_OTHER): Payer: Medicare Other | Admitting: *Deleted

## 2022-01-28 DIAGNOSIS — Z5181 Encounter for therapeutic drug level monitoring: Secondary | ICD-10-CM | POA: Diagnosis not present

## 2022-01-28 DIAGNOSIS — I4891 Unspecified atrial fibrillation: Secondary | ICD-10-CM

## 2022-01-28 DIAGNOSIS — I480 Paroxysmal atrial fibrillation: Secondary | ICD-10-CM

## 2022-01-28 LAB — POCT INR: INR: 2.1 (ref 2.0–3.0)

## 2022-01-28 NOTE — Patient Instructions (Signed)
Description   Continue taking warfarin 1.'25mg'$  daily except  2.'5mg'$  on Mondays, Wednesdays and Fridays. Recheck INR in 6 weeks. Call with any new medications or procedure/surgery dates. [facility] # [phone]. Main # [phone]. Fax number for shoulder procedure clearance [phone].

## 2022-02-04 ENCOUNTER — Other Ambulatory Visit: Payer: Self-pay | Admitting: Oncology

## 2022-02-05 NOTE — Telephone Encounter (Signed)
Pt should contact PCP for refill of this medication

## 2022-03-12 ENCOUNTER — Ambulatory Visit (INDEPENDENT_AMBULATORY_CARE_PROVIDER_SITE_OTHER): Payer: Medicare Other | Admitting: *Deleted

## 2022-03-12 DIAGNOSIS — I4891 Unspecified atrial fibrillation: Secondary | ICD-10-CM | POA: Diagnosis not present

## 2022-03-12 DIAGNOSIS — I480 Paroxysmal atrial fibrillation: Secondary | ICD-10-CM

## 2022-03-12 DIAGNOSIS — Z5181 Encounter for therapeutic drug level monitoring: Secondary | ICD-10-CM

## 2022-03-12 LAB — POCT INR: INR: 1.6 — AB (ref 2.0–3.0)

## 2022-03-12 NOTE — Patient Instructions (Signed)
Description   Today take '5mg'$  (1 tablet) then continue taking warfarin 1.'25mg'$  daily except  2.'5mg'$  on Mondays, Wednesdays and Fridays. Recheck INR in 3 weeks (normally 6 weeks). Call with any new medications or procedure/surgery dates. [facility] # [phone]. Main # [phone]. Fax number for shoulder procedure clearance [phone].

## 2022-03-31 ENCOUNTER — Other Ambulatory Visit: Payer: Self-pay

## 2022-04-02 ENCOUNTER — Ambulatory Visit (INDEPENDENT_AMBULATORY_CARE_PROVIDER_SITE_OTHER): Payer: Medicare Other | Admitting: *Deleted

## 2022-04-02 DIAGNOSIS — I4891 Unspecified atrial fibrillation: Secondary | ICD-10-CM

## 2022-04-02 DIAGNOSIS — Z5181 Encounter for therapeutic drug level monitoring: Secondary | ICD-10-CM | POA: Diagnosis not present

## 2022-04-02 DIAGNOSIS — I480 Paroxysmal atrial fibrillation: Secondary | ICD-10-CM | POA: Diagnosis not present

## 2022-04-02 LAB — POCT INR: INR: 2.2 (ref 2.0–3.0)

## 2022-04-02 NOTE — Patient Instructions (Signed)
Description   Continue taking warfarin 1.'25mg'$  daily except  2.'5mg'$  on Mondays, Wednesdays and Fridays. Recheck INR in 5 weeks. Call with any new medications or procedure/surgery dates. [facility] # [phone]. Main # [phone]. Fax number for shoulder procedure clearance [phone].

## 2022-04-08 ENCOUNTER — Other Ambulatory Visit: Payer: Self-pay

## 2022-04-21 DIAGNOSIS — M25511 Pain in right shoulder: Secondary | ICD-10-CM | POA: Diagnosis not present

## 2022-04-21 DIAGNOSIS — M19011 Primary osteoarthritis, right shoulder: Secondary | ICD-10-CM | POA: Diagnosis not present

## 2022-04-24 ENCOUNTER — Other Ambulatory Visit: Payer: Self-pay | Admitting: Oncology

## 2022-05-07 ENCOUNTER — Ambulatory Visit: Payer: Medicare Other | Attending: Internal Medicine | Admitting: *Deleted

## 2022-05-07 DIAGNOSIS — Z5181 Encounter for therapeutic drug level monitoring: Secondary | ICD-10-CM | POA: Diagnosis not present

## 2022-05-07 DIAGNOSIS — I480 Paroxysmal atrial fibrillation: Secondary | ICD-10-CM | POA: Diagnosis not present

## 2022-05-07 DIAGNOSIS — Z23 Encounter for immunization: Secondary | ICD-10-CM | POA: Diagnosis not present

## 2022-05-07 LAB — POCT INR: INR: 2.1 (ref 2.0–3.0)

## 2022-05-07 NOTE — Patient Instructions (Signed)
Description   Continue taking warfarin 1.25mg daily except  2.5mg on Mondays, Wednesdays and Fridays. Recheck INR in 6 weeks. Call with any new medications or procedure/surgery dates. [facility] # [phone] or [phone]. Main # [phone]. Fax number for shoulder procedure clearance [phone].      

## 2022-06-18 ENCOUNTER — Ambulatory Visit: Payer: Medicare Other | Attending: Cardiology | Admitting: *Deleted

## 2022-06-18 DIAGNOSIS — Z5181 Encounter for therapeutic drug level monitoring: Secondary | ICD-10-CM | POA: Diagnosis not present

## 2022-06-18 DIAGNOSIS — I480 Paroxysmal atrial fibrillation: Secondary | ICD-10-CM | POA: Diagnosis not present

## 2022-06-18 LAB — POCT INR: INR: 2 (ref 2.0–3.0)

## 2022-06-18 NOTE — Patient Instructions (Signed)
Description   Continue taking warfarin 1.25mg daily except  2.5mg on Mondays, Wednesdays and Fridays. Recheck INR in 6 weeks. Call with any new medications or procedure/surgery dates. [facility] # [phone] or [phone]. Main # [phone]. Fax number for shoulder procedure clearance [phone].      

## 2022-06-25 DIAGNOSIS — G5622 Lesion of ulnar nerve, left upper limb: Secondary | ICD-10-CM | POA: Diagnosis not present

## 2022-06-25 DIAGNOSIS — Z6826 Body mass index (BMI) 26.0-26.9, adult: Secondary | ICD-10-CM | POA: Diagnosis not present

## 2022-07-02 ENCOUNTER — Other Ambulatory Visit: Payer: Self-pay | Admitting: Family Medicine

## 2022-07-10 DIAGNOSIS — Z23 Encounter for immunization: Secondary | ICD-10-CM | POA: Diagnosis not present

## 2022-07-14 ENCOUNTER — Other Ambulatory Visit: Payer: Self-pay | Admitting: Interventional Cardiology

## 2022-07-14 DIAGNOSIS — I480 Paroxysmal atrial fibrillation: Secondary | ICD-10-CM

## 2022-07-15 NOTE — Telephone Encounter (Signed)
Prescription refill request received for warfarin Lov: 12/18/21 Larry Bautista)  Next INR check: 07/30/22 Warfarin tablet strength: 2.'5mg'$  and '5mg'$   Appropriate dose and refill sent to requested pharmacy.

## 2022-07-17 ENCOUNTER — Telehealth: Payer: Self-pay | Admitting: *Deleted

## 2022-07-17 NOTE — Telephone Encounter (Signed)
Larry Bautista had called scheduler to request an appointment with Dr. Benay Spice. Last appointment 12/18/20 and he did not return for 6 month F/U.  Left VM for patient to return call to inform office if he is having a new issue or just trying to re-establish.

## 2022-07-28 DIAGNOSIS — M25511 Pain in right shoulder: Secondary | ICD-10-CM | POA: Diagnosis not present

## 2022-07-28 DIAGNOSIS — M19011 Primary osteoarthritis, right shoulder: Secondary | ICD-10-CM | POA: Diagnosis not present

## 2022-07-30 ENCOUNTER — Ambulatory Visit: Payer: Medicare Other | Attending: Cardiology | Admitting: *Deleted

## 2022-07-30 DIAGNOSIS — I480 Paroxysmal atrial fibrillation: Secondary | ICD-10-CM

## 2022-07-30 DIAGNOSIS — Z5181 Encounter for therapeutic drug level monitoring: Secondary | ICD-10-CM

## 2022-07-30 LAB — POCT INR: INR: 2.5 (ref 2.0–3.0)

## 2022-07-30 NOTE — Patient Instructions (Signed)
Description   Continue taking warfarin 1.'25mg'$  daily except  2.'5mg'$  on Mondays, Wednesdays and Fridays. Recheck INR in 6 weeks. Call with any new medications or procedure/surgery dates. [facility] # [phone] or [phone]. Main # [phone]. Fax number for shoulder procedure clearance [phone].

## 2022-08-11 ENCOUNTER — Other Ambulatory Visit: Payer: Self-pay | Admitting: Interventional Cardiology

## 2022-09-10 ENCOUNTER — Ambulatory Visit: Payer: Medicare Other | Attending: Interventional Cardiology

## 2022-09-10 DIAGNOSIS — I480 Paroxysmal atrial fibrillation: Secondary | ICD-10-CM

## 2022-09-10 DIAGNOSIS — Z5181 Encounter for therapeutic drug level monitoring: Secondary | ICD-10-CM | POA: Diagnosis not present

## 2022-09-10 LAB — POCT INR: INR: 1.7 — AB (ref 2.0–3.0)

## 2022-09-10 NOTE — Patient Instructions (Signed)
Description   Take '5mg'$  today and then continue taking warfarin 1.'25mg'$  daily except  2.'5mg'$  on Mondays, Wednesdays and Fridays.  Recheck INR in 5 weeks. Call with any new medications or procedure/surgery dates. [facility]in Clinic [phone].  Fax number for shoulder procedure clearance [phone].

## 2022-09-24 DIAGNOSIS — N5201 Erectile dysfunction due to arterial insufficiency: Secondary | ICD-10-CM | POA: Diagnosis not present

## 2022-09-24 DIAGNOSIS — R3121 Asymptomatic microscopic hematuria: Secondary | ICD-10-CM | POA: Diagnosis not present

## 2022-09-24 DIAGNOSIS — N200 Calculus of kidney: Secondary | ICD-10-CM | POA: Diagnosis not present

## 2022-09-24 DIAGNOSIS — R3911 Hesitancy of micturition: Secondary | ICD-10-CM | POA: Diagnosis not present

## 2022-09-24 DIAGNOSIS — N401 Enlarged prostate with lower urinary tract symptoms: Secondary | ICD-10-CM | POA: Diagnosis not present

## 2022-10-15 ENCOUNTER — Ambulatory Visit: Payer: Medicare Other | Attending: Cardiovascular Disease

## 2022-10-15 DIAGNOSIS — I480 Paroxysmal atrial fibrillation: Secondary | ICD-10-CM | POA: Diagnosis not present

## 2022-10-15 DIAGNOSIS — Z5181 Encounter for therapeutic drug level monitoring: Secondary | ICD-10-CM | POA: Diagnosis not present

## 2022-10-15 LAB — POCT INR: INR: 2.4 (ref 2.0–3.0)

## 2022-10-15 NOTE — Patient Instructions (Signed)
Description   Continue taking warfarin 1.'25mg'$  daily except  2.'5mg'$  on Mondays, Wednesdays and Fridays.  Recheck INR in 6 weeks.  Call with any new medications or procedure/surgery dates. Coumadin Clinic [phone].  Fax number for shoulder procedure clearance [phone].

## 2022-11-14 DIAGNOSIS — M19011 Primary osteoarthritis, right shoulder: Secondary | ICD-10-CM | POA: Diagnosis not present

## 2022-11-18 ENCOUNTER — Encounter (HOSPITAL_BASED_OUTPATIENT_CLINIC_OR_DEPARTMENT_OTHER): Payer: Self-pay | Admitting: Cardiology

## 2022-11-18 ENCOUNTER — Ambulatory Visit (INDEPENDENT_AMBULATORY_CARE_PROVIDER_SITE_OTHER): Payer: Medicare Other | Admitting: Cardiology

## 2022-11-18 VITALS — BP 142/74 | HR 66 | Ht 68.0 in | Wt 175.0 lb

## 2022-11-18 DIAGNOSIS — I2510 Atherosclerotic heart disease of native coronary artery without angina pectoris: Secondary | ICD-10-CM

## 2022-11-18 DIAGNOSIS — I10 Essential (primary) hypertension: Secondary | ICD-10-CM | POA: Diagnosis not present

## 2022-11-18 DIAGNOSIS — D6869 Other thrombophilia: Secondary | ICD-10-CM | POA: Diagnosis not present

## 2022-11-18 DIAGNOSIS — Z7901 Long term (current) use of anticoagulants: Secondary | ICD-10-CM | POA: Diagnosis not present

## 2022-11-18 DIAGNOSIS — I4821 Permanent atrial fibrillation: Secondary | ICD-10-CM

## 2022-11-18 DIAGNOSIS — E7800 Pure hypercholesterolemia, unspecified: Secondary | ICD-10-CM

## 2022-11-18 NOTE — Progress Notes (Signed)
Cardiology Office Note:    Date:  11/18/2022   ID:  Larry Bautista, DOB April 28, 1941, MRN [id number]  PCP:  [name], MD  Cardiologist:  Buford Dresser, MD  Referring MD: [name], MD   CC: cardiology follow up  History of Present Illness:    Larry Bautista is a 81 y.o. male with a hx of CAD with LAD stent 2004, chronic atrial fibrillation, anticoagulation therapy, hypertension, Hyperlipidemia, and chronic diastolic heart failure who is seen for follow up. She was previously seen by Dr. Pernell Dupre, new to me 11/18/22.  During his last visit with Dr. Tamala Julian 12/18/2021 he recounted increased shortness of breath when walking up stairs, but no increase in difficulty walking around on flat surfaces.  Today, the patient states that he has been doing well recently. His only concern is his A-fib which never causes him limitations. He sometimes gets pain when he is out of rhythm but he is able to resolve it with palpation.   He hasn't noticed any new limitations, he is able to climb stairs in his house and is careful to not fall due to his imbalance. He does note some shortness of breath after climbing stairs. He has fallen 3-4 times but he attributes this to balance and not syncope.  His blood pressures at home usually run around 130/140 but today in clinic it is 160/80, and on recheck it was 142/74 . He has had slight bleeding issues on coumadin but has been on it for 20 years and feels that he has had success with it.  He denies any palpitations, or peripheral edema. No lightheadedness, headaches, syncope, orthopnea, or PND.  Past Medical History:  Diagnosis Date   Allergy    CHF (congestive heart failure) (HCC)    Chronic knee pain    Coronary artery disease    with LAD DE stent 2004   Dysplastic nevus 03/26/2016   right mid med calf   Dysrhythmia    Heel spur, left    Hyperlipidemia    Hypertension    Kidney stones    Lower back pain    status post surgery  for spondylolisthesis   Migraine    Persistent atrial fibrillation ([place])    longstanding persistent (since 05/2008)   Plantar fasciitis     Past Surgical History:  Procedure Laterality Date   BACK SURGERY     spondylolisthesis   CARDIAC CATHETERIZATION  03/06/2003   CORONARY ANGIOPLASTY WITH STENT PLACEMENT  04/2003   CYPHER stent implantation in the LAD   ESOPHAGOGASTRODUODENOSCOPY (EGD) WITH PROPOFOL N/A 01/07/2018   Procedure: ESOPHAGOGASTRODUODENOSCOPY (EGD) WITH PROPOFOL;  Surgeon: Wonda Horner, MD;  Location: [facility] ENDOSCOPY;  Service: Endoscopy;  Laterality: N/A;   HAND SURGERY Right 2014   carpal tunnel   INGUINAL HERNIA REPAIR Right 04/26/2020   Procedure: RIGHT INGUINAL HERNIA REPAIR WITH MESH;  Surgeon: Coralie Keens, MD;  Location: [place];  Service: General;  Laterality: Right;   SKIN CANCER EXCISION  1980's   Face   VASCULAR SURGERY     Vascular Stent    Current Medications: Current Outpatient Medications on File Prior to Visit  Medication Sig   acetaminophen (TYLENOL) 325 MG tablet Take 2 tablets (650 mg total) by mouth QID. (Patient taking differently: Take 325 mg by mouth at bedtime.)   cetirizine (ZYRTEC) 10 MG tablet Take 10 mg by mouth daily.   diclofenac Sodium (VOLTAREN) 1 % GEL Apply topically 4 (four) times daily.  diphenhydramine-acetaminophen (TYLENOL PM) 25-500 MG TABS tablet Take 1 tablet by mouth at bedtime as needed.   eplerenone (INSPRA) 25 MG tablet Take 1 tablet (25 mg total) by mouth every Monday, Wednesday, and Friday.   fluticasone (FLONASE) 50 MCG/ACT nasal spray USE 2 SPRAYS IN EACH NOSTRIL DAILY   gabapentin (NEURONTIN) 100 MG capsule Take 100 mg by mouth daily.   nitroGLYCERIN (NITROSTAT) 0.4 MG SL tablet Place 1 tablet (0.4 mg total) under the tongue every 5 (five) minutes as needed.   psyllium (METAMUCIL) 58.6 % powder Take 1 packet by mouth daily.    rosuvastatin (CRESTOR) 20 MG tablet Take 1 tablet (20 mg total) by  mouth daily. Please call [phone] to schedule an appointment for future refills. Thank you   tamsulosin (FLOMAX) 0.4 MG CAPS capsule Take 0.4 mg by mouth daily after supper.   tiZANidine (ZANAFLEX) 4 MG tablet Take 4 mg by mouth daily.   vitamin C (ASCORBIC ACID) 500 MG tablet Take 500 mg by mouth daily.   warfarin (COUMADIN) 2.5 MG tablet TAKE 1/2 TABLET ON SUNDAY, TUESDAY, THURSDAY, AND SATURDAY OR AS DIRECTED BY COUMADIN CLINIC   warfarin (COUMADIN) 5 MG tablet TAKE 1/2 TABLET ON MONDAY, WEDNESDAY ANDFRIDAY OR AS DIRECTED BY COUMADIN CLINIC   No current facility-administered medications on file prior to visit.     Allergies:   Celecoxib, Lisinopril, and Simvastatin   Social History   Tobacco Use   Smoking status: Former    Packs/day: 2.00    Years: 35.00    Total pack years: 70.00    Types: Cigarettes    Start date: 09/08/1954    Quit date: 11/06/1989    Years since quitting: 33.0   Smokeless tobacco: Never   Tobacco comments:    Smoked from [phone] (35 years)  Vaping Use   Vaping Use: Never used  Substance Use Topics   Alcohol use: Not Currently    Alcohol/week: 2.0 standard drinks of alcohol    Types: 2 Standard drinks or equivalent per week    Comment: 1961-2009 (Last drink 2009)    Drug use: No    Family History: family history includes Atrial fibrillation in his brother and brother; Bladder Cancer in his brother; COPD in his brother; Colon cancer in his father; Congestive Heart Failure in his brother and father; Heart attack in his father; Lung cancer in his brother and mother; Migraines in his daughter; Stroke in his brother.  ROS:   Please see the history of present illness.   (+) balance issues/ vertigo (+) Chest pain (+) excessive bleeding Additional pertinent ROS: Constitutional: Negative for chills, fever, night sweats, unintentional weight loss  HENT: Negative for ear pain and hearing loss.   Eyes: Negative for loss of vision and eye pain.   Respiratory: Negative for cough, sputum, wheezing.   Cardiovascular: See HPI. Gastrointestinal: Negative for abdominal pain, melena, and hematochezia.  Genitourinary: Negative for dysuria and hematuria.  Musculoskeletal: Negative for myalgias. Positive for falls Skin: Negative for itching and rash.  Neurological: Negative for focal weakness, focal sensory changes and loss of consciousness.  Endo/Heme/Allergies: Does bleed easily    EKGs/Labs/Other Studies Reviewed:    The following studies were reviewed today: Echo 08/23/21 1. Left ventricular ejection fraction, by estimation, is 60 to 65%. The  left ventricle has normal function. The left ventricle has no regional  wall motion abnormalities. Left ventricular diastolic parameters are  indeterminate.   2. Right ventricular systolic function is normal. The right ventricular  size is normal. There is normal pulmonary artery systolic pressure.   3. Left atrial size was moderately dilated.   4. Right atrial size was moderately dilated.   5. The mitral valve is normal in structure. Trivial mitral valve  regurgitation. No evidence of mitral stenosis.   6. The aortic valve is tricuspid. Aortic valve regurgitation is not  visualized. No aortic stenosis is present.   7. The inferior vena cava is normal in size with greater than 50%  respiratory variability, suggesting right atrial pressure of 3 mmHg.   8. Evidence of atrial level shunting detected by color flow Doppler.  There is a moderately sized patent foramen ovale with predominantly left  to right shunting across the atrial septum.   EKG:  EKG is personally reviewed.   11/18/2022: atrial fibrillation at 66 bpm  Recent Labs: No results found for requested labs within last 365 days.  Recent Lipid Panel    Component Value Date/Time   CHOL 128 07/24/2020 1336   TRIG 101 07/24/2020 1336   HDL 49 07/24/2020 1336   CHOLHDL 2.6 07/24/2020 1336   CHOLHDL 3 01/24/2015 0926   VLDL 26.8  01/24/2015 0926   LDLCALC 60 07/24/2020 1336   LDLDIRECT 136.6 08/24/2013 0938    Physical Exam:    VS:  BP (!) 142/74 (BP Location: Left Arm, Patient Position: Sitting, Cuff Size: Normal)   Pulse 66   Ht '5\' 8"'$  (1.727 m)   Wt 175 lb (79.4 kg)   BMI 26.61 kg/m     Wt Readings from Last 3 Encounters:  11/18/22 175 lb (79.4 kg)  12/18/21 175 lb 3.2 oz (79.5 kg)  09/16/21 181 lb 6.4 oz (82.3 kg)    GEN: Well nourished, well developed in no acute distress HEENT: Normal, moist mucous membranes NECK: No JVD CARDIAC: irregularly irregular rhythm, normal S1 and S2, no rubs or gallops. No murmur. VASCULAR: Radial and DP pulses 2+ bilaterally. No carotid bruits RESPIRATORY:  Clear to auscultation without rales, wheezing or rhonchi  ABDOMEN: Soft, non-tender, non-distended MUSCULOSKELETAL:  Ambulates independently SKIN: Warm and dry, no edema NEUROLOGIC:  Alert and oriented x 3. No focal neuro deficits noted. PSYCHIATRIC:  Normal affect    ASSESSMENT:    1. Permanent atrial fibrillation ([place])   2. CAD in native artery   3. Essential hypertension   4. Secondary hypercoagulable state ([place])   5. Long term current use of anticoagulant   6. Pure hypercholesterolemia    PLAN:    Atrial fibrillation, permanenet -rate controlled -CHA2DS2/VAS Stroke Risk Points= 5  -he is on coumadin and doing well. Doesn't want to change to DOAC now, but would consider once they are generic  Hypertension -slightly elevated today, but with Dr. Tamala Julian systolic was [phone]. Will hold on changes today, discussed home BP monitoring -continue three times weekly eplerenone  CAD Hypercholesterolemia -check lipids -continue rosuvastatin 20 mg daily -no aspirin as he is on coumadin -no angina, reviewed red flag warning signs that need immediate medical attention  Plan for follow up: 1 year  Buford Dresser, MD, PhD, [facility] [address], [place], [place] [address] [phone]   Medication Adjustments/Labs and Tests Ordered: Current medicines are reviewed at length with the patient today.  Concerns regarding medicines are outlined above.  Orders Placed This Encounter  Procedures   Lipid panel   Comprehensive metabolic panel  Ambulatory referral to [facility]   EKG 12-Lead   No orders of the defined types were placed in this encounter.   Patient Instructions  Medication Instructions:  Your physician recommends that you continue on your current medications as directed. Please refer to the Current Medication list given to you today.  *If you need a refill on your cardiac medications before your next appointment, please call your pharmacy*  Lab Work: Fasting lp/cmet soon   Testing/Procedures: NONE  Follow-Up: At [facility], you and your health needs are our priority.  As part of our continuing mission to provide you with exceptional heart care, we have created designated Provider Care Teams.  These Care Teams include your primary Cardiologist (physician) and Advanced Practice Providers (APPs -  Physician Assistants and Nurse Practitioners) who all work together to provide you with the care you need, when you need it.  We recommend signing up for the patient portal called "MyChart".  Sign up information is provided on this After Visit Summary.  MyChart is used to connect with patients for Virtual Visits (Telemedicine).  Patients are able to view lab/test results, encounter notes, upcoming appointments, etc.  Non-urgent messages can be sent to your provider as well.   To learn more about what you can do with MyChart, go to [email or web address].    Your next appointment:   12 month(s)  The format for your next appointment:   In Person  Provider:   Buford Dresser, MD       I,Coren O'Brien,acting as a scribe for Buford Dresser, MD.,have documented all relevant documentation on the behalf of Buford Dresser, MD,as directed by  Buford Dresser, MD while in the presence of Buford Dresser, MD.  I, Buford Dresser, MD, have reviewed all documentation for this visit. The documentation on 11/18/22 for the exam, diagnosis, procedures, and orders are all accurate and complete.

## 2022-11-18 NOTE — Patient Instructions (Signed)
Medication Instructions:  Your physician recommends that you continue on your current medications as directed. Please refer to the Current Medication list given to you today.  *If you need a refill on your cardiac medications before your next appointment, please call your pharmacy*  Lab Work: Fasting lp/cmet soon   Testing/Procedures: NONE  Follow-Up: At [facility], you and your health needs are our priority.  As part of our continuing mission to provide you with exceptional heart care, we have created designated Provider Care Teams.  These Care Teams include your primary Cardiologist (physician) and Advanced Practice Providers (APPs -  Physician Assistants and Nurse Practitioners) who all work together to provide you with the care you need, when you need it.  We recommend signing up for the patient portal called "MyChart".  Sign up information is provided on this After Visit Summary.  MyChart is used to connect with patients for Virtual Visits (Telemedicine).  Patients are able to view lab/test results, encounter notes, upcoming appointments, etc.  Non-urgent messages can be sent to your provider as well.   To learn more about what you can do with MyChart, go to [email or web address].    Your next appointment:   12 month(s)  The format for your next appointment:   In Person  Provider:   Buford Dresser, MD

## 2022-11-26 ENCOUNTER — Ambulatory Visit: Payer: Medicare Other | Attending: Cardiology | Admitting: *Deleted

## 2022-11-26 DIAGNOSIS — I10 Essential (primary) hypertension: Secondary | ICD-10-CM | POA: Diagnosis not present

## 2022-11-26 DIAGNOSIS — Z5181 Encounter for therapeutic drug level monitoring: Secondary | ICD-10-CM | POA: Diagnosis not present

## 2022-11-26 DIAGNOSIS — I480 Paroxysmal atrial fibrillation: Secondary | ICD-10-CM | POA: Diagnosis not present

## 2022-11-26 DIAGNOSIS — I2510 Atherosclerotic heart disease of native coronary artery without angina pectoris: Secondary | ICD-10-CM | POA: Diagnosis not present

## 2022-11-26 LAB — POCT INR: INR: 2 (ref 2.0–3.0)

## 2022-11-26 NOTE — Patient Instructions (Signed)
Description   Continue taking warfarin 1.25mg daily except  2.5mg on Mondays, Wednesdays and Fridays.  Recheck INR in 6 weeks.  Call with any new medications or procedure/surgery dates. [facility] #[phone].  Fax number for shoulder procedure clearance [phone].       

## 2022-11-27 LAB — COMPREHENSIVE METABOLIC PANEL
ALT: 24 IU/L (ref 0–44)
AST: 24 IU/L (ref 0–40)
Albumin/Globulin Ratio: 2.3 — ABNORMAL HIGH (ref 1.2–2.2)
Albumin: 4.4 g/dL (ref 3.7–4.7)
Alkaline Phosphatase: 52 IU/L (ref 44–121)
BUN/Creatinine Ratio: 21 (ref 10–24)
BUN: 16 mg/dL (ref 8–27)
Bilirubin Total: 0.6 mg/dL (ref 0.0–1.2)
CO2: 23 mmol/L (ref 20–29)
Calcium: 9.4 mg/dL (ref 8.6–10.2)
Chloride: 107 mmol/L — ABNORMAL HIGH (ref 96–106)
Creatinine, Ser: 0.75 mg/dL — ABNORMAL LOW (ref 0.76–1.27)
Globulin, Total: 1.9 g/dL (ref 1.5–4.5)
Glucose: 91 mg/dL (ref 70–99)
Potassium: 4.5 mmol/L (ref 3.5–5.2)
Sodium: 146 mmol/L — ABNORMAL HIGH (ref 134–144)
Total Protein: 6.3 g/dL (ref 6.0–8.5)
eGFR: 90 mL/min/{1.73_m2} (ref >59)

## 2022-11-27 LAB — LIPID PANEL
Chol/HDL Ratio: 2.1 ratio (ref 0.0–5.0)
Cholesterol, Total: 140 mg/dL (ref 100–199)
HDL: 68 mg/dL (ref >39)
LDL Chol Calc (NIH): 59 mg/dL (ref 0–99)
Triglycerides: 61 mg/dL (ref 0–149)
VLDL Cholesterol Cal: 13 mg/dL (ref 5–40)

## 2022-12-09 ENCOUNTER — Other Ambulatory Visit: Payer: Self-pay | Admitting: Orthopaedic Surgery

## 2022-12-09 DIAGNOSIS — M19011 Primary osteoarthritis, right shoulder: Secondary | ICD-10-CM | POA: Diagnosis not present

## 2022-12-09 DIAGNOSIS — Z09 Encounter for follow-up examination after completed treatment for conditions other than malignant neoplasm: Secondary | ICD-10-CM

## 2022-12-16 ENCOUNTER — Telehealth (HOSPITAL_BASED_OUTPATIENT_CLINIC_OR_DEPARTMENT_OTHER): Payer: Self-pay

## 2022-12-16 NOTE — Telephone Encounter (Signed)
   Pre-operative Risk Assessment    Patient Name: Larry Bautista  DOB: 20-Nov-1940 MRN: [id number]      Request for Surgical Clearance    Procedure:   Right Reverse Total Shoulder Replacement  Date of Surgery:  Clearance TBD                                 Surgeon:  Ramond Marrow, MD Surgeon's Group or Practice Name:  [facility] Phone number:  [phone] Fax number:  [phone]   Type of Clearance Requested:   - Medical  - Pharmacy:  Hold Warfarin (Coumadin) how long?   Type of Anesthesia:   Interscalene Block   Additional requests/questions:   None  Lorella Nimrod   12/16/2022, 1:46 PM

## 2022-12-17 NOTE — Telephone Encounter (Signed)
Patient with diagnosis of A Fib on warfarin for anticoagulation.    Procedure: Right Reverse Total Shoulder Replacement  Date of procedure: TBD   CHA2DS2-VASc Score = 4  This indicates a 4.8% annual risk of stroke. The patient's score is based upon: CHF History: 0 HTN History: 1 Diabetes History: 0 Stroke History: 0 Vascular Disease History: 1 Age Score: 2 Gender Score: 0    CrCl 84 mL/min Platelet count Overdue  Per office protocol, patient can hold warfarin for 5 days prior to procedure.    Patient will not need bridging with Lovenox (enoxaparin) around procedure. Will need updated CBC.  **This guidance is not considered finalized until pre-operative APP has relayed final recommendations.**

## 2022-12-17 NOTE — Telephone Encounter (Signed)
Dr. Cristal Deer,   You saw this patient on 11/18/2022. Will you please comment on clearance for right reverse total shoulder replacement?  Please route your response to P CV DIV Preop. I will communicate with requesting office once you have given recommendations.   Thank you!  Carlos Levering, NP

## 2022-12-18 ENCOUNTER — Ambulatory Visit (INDEPENDENT_AMBULATORY_CARE_PROVIDER_SITE_OTHER): Payer: Medicare Other | Admitting: Dermatology

## 2022-12-18 VITALS — BP 148/90 | HR 73

## 2022-12-18 DIAGNOSIS — L578 Other skin changes due to chronic exposure to nonionizing radiation: Secondary | ICD-10-CM

## 2022-12-18 DIAGNOSIS — D229 Melanocytic nevi, unspecified: Secondary | ICD-10-CM

## 2022-12-18 DIAGNOSIS — L814 Other melanin hyperpigmentation: Secondary | ICD-10-CM | POA: Diagnosis not present

## 2022-12-18 DIAGNOSIS — Z86018 Personal history of other benign neoplasm: Secondary | ICD-10-CM | POA: Diagnosis not present

## 2022-12-18 DIAGNOSIS — L820 Inflamed seborrheic keratosis: Secondary | ICD-10-CM

## 2022-12-18 DIAGNOSIS — Z1283 Encounter for screening for malignant neoplasm of skin: Secondary | ICD-10-CM | POA: Diagnosis not present

## 2022-12-18 DIAGNOSIS — L821 Other seborrheic keratosis: Secondary | ICD-10-CM | POA: Diagnosis not present

## 2022-12-18 NOTE — Progress Notes (Signed)
   Follow-Up Visit   Subjective  Larry Bautista is a 82 y.o. male who presents for the following: Skin Cancer Screening and Full Body Skin Exam  The patient presents for Total-Body Skin Exam (TBSE) for skin cancer screening and mole check. The patient has spots, moles and lesions to be evaluated, some may be new or changing and the patient has concerns that these could be cancer.    The following portions of the chart were reviewed this encounter and updated as appropriate: medications, allergies, medical history  Review of Systems:  No other skin or systemic complaints except as noted in HPI or Assessment and Plan.  Objective  Well appearing patient in no apparent distress; mood and affect are within normal limits.  A full examination was performed including scalp, head, eyes, ears, nose, lips, neck, chest, axillae, abdomen, back, buttocks, bilateral upper extremities, bilateral lower extremities, hands, feet, fingers, toes, fingernails, and toenails. All findings within normal limits unless otherwise noted below.   Relevant physical exam findings are noted in the Assessment and Plan.    Assessment & Plan   LENTIGINES, SEBORRHEIC KERATOSES, HEMANGIOMAS - Benign normal skin lesions - Benign-appearing - Call for any changes  MELANOCYTIC NEVI - Tan-brown and/or pink-flesh-colored symmetric macules and papules - Benign appearing on exam today - Observation - Call clinic for new or changing moles - Recommend daily use of broad spectrum spf 30+ sunscreen to sun-exposed areas.   ACTINIC DAMAGE - Chronic condition, secondary to cumulative UV/sun exposure - diffuse scaly erythematous macules with underlying dyspigmentation - Recommend daily broad spectrum sunscreen SPF 30+ to sun-exposed areas, reapply every 2 hours as needed.  - Staying in the shade or wearing long sleeves, sun glasses (UVA+UVB protection) and wide brim hats (4-inch brim around the entire circumference of the hat)  are also recommended for sun protection.  - Call for new or changing lesions.  HISTORY OF DYSPLASTIC NEVUS No evidence of recurrence today Recommend regular full body skin exams Recommend daily broad spectrum sunscreen SPF 30+ to sun-exposed areas, reapply every 2 hours as needed.  Call if any new or changing lesions are noted between office visits  INFLAMED SEBORRHEIC KERATOSIS Exam: Erythematous keratotic or waxy stuck-on papule or plaque.  Symptomatic, irritating, patient would like treated.  Benign-appearing.  Call clinic for new or changing lesions.   Prior to procedure, discussed risks of blister formation, small wound, skin dyspigmentation, or rare scar following treatment. Recommend Vaseline ointment to treated areas while healing.  Destruction Procedure Note Destruction method: cryotherapy   Informed consent: discussed and consent obtained   Lesion destroyed using liquid nitrogen: Yes   Outcome: patient tolerated procedure well with no complications   Post-procedure details: wound care instructions given   Locations: L groin x 1 # of Lesions Treated: 1  SKIN CANCER SCREENING PERFORMED TODAY.   Return in about 1 year (around 12/18/2023) for TBSE.  Maylene Roes, CMA, am acting as scribe for Armida Sans, MD .   Documentation: I have reviewed the above documentation for accuracy and completeness, and I agree with the above.  Armida Sans, MD

## 2022-12-18 NOTE — Patient Instructions (Signed)
Due to recent changes in healthcare laws, you may see results of your pathology and/or laboratory studies on MyChart before the doctors have had a chance to review them. We understand that in some cases there may be results that are confusing or concerning to you. Please understand that not all results are received at the same time and often the doctors may need to interpret multiple results in order to provide you with the best plan of care or course of treatment. Therefore, we ask that you please give us 2 business days to thoroughly review all your results before contacting the office for clarification. Should we see a critical lab result, you will be contacted sooner.   If You Need Anything After Your Visit  If you have any questions or concerns for your doctor, please call our main line at [phone] and press option 4 to reach your doctor's medical assistant. If no one answers, please leave a voicemail as directed and we will return your call as soon as possible. Messages left after 4 pm will be answered the following business day.   You may also send us a message via MyChart. We typically respond to MyChart messages within 1-2 business days.  For prescription refills, please ask your pharmacy to contact our office. Our fax number is [phone].  If you have an urgent issue when the clinic is closed that cannot wait until the next business day, you can page your doctor at the number below.    Please note that while we do our best to be available for urgent issues outside of office hours, we are not available 24/7.   If you have an urgent issue and are unable to reach us, you may choose to seek medical care at your doctor's office, retail clinic, urgent care center, or emergency room.  If you have a medical emergency, please immediately call 911 or go to the emergency department.  Pager Numbers  - Dr. Kowalski: [phone]  - Dr. Moye: [phone]  - Dr. Stewart:  [phone]  In the event of inclement weather, please call our main line at [phone] for an update on the status of any delays or closures.  Dermatology Medication Tips: Please keep the boxes that topical medications come in in order to help keep track of the instructions about where and how to use these. Pharmacies typically print the medication instructions only on the boxes and not directly on the medication tubes.   If your medication is too expensive, please contact our office at [phone] option 4 or send us a message through MyChart.   We are unable to tell what your co-pay for medications will be in advance as this is different depending on your insurance coverage. However, we may be able to find a substitute medication at lower cost or fill out paperwork to get insurance to cover a needed medication.   If a prior authorization is required to get your medication covered by your insurance company, please allow us 1-2 business days to complete this process.  Drug prices often vary depending on where the prescription is filled and some pharmacies may offer cheaper prices.  The website www.goodrx.com contains coupons for medications through different pharmacies. The prices here do not account for what the cost may be with help from insurance (it may be cheaper with your insurance), but the website can give you the price if you did not use any insurance.  - You can print the associated coupon and take it with   your prescription to the pharmacy.  - You may also stop by our office during regular business hours and pick up a GoodRx coupon card.  - If you need your prescription sent electronically to a different pharmacy, notify our office through [facility] MyChart or by phone at [phone] option 4.     Si Usted Necesita Algo Despus de Su Visita  Tambin puede enviarnos un mensaje a travs de MyChart. Por lo general respondemos a los mensajes de MyChart en el transcurso de 1 a 2  das hbiles.  Para renovar recetas, por favor pida a su farmacia que se ponga en contacto con nuestra oficina. Nuestro nmero de fax es el [phone].  Si tiene un asunto urgente cuando la clnica est cerrada y que no puede esperar hasta el siguiente da hbil, puede llamar/localizar a su doctor(a) al nmero que aparece a continuacin.   Por favor, tenga en cuenta que aunque hacemos todo lo posible para estar disponibles para asuntos urgentes fuera del horario de oficina, no estamos disponibles las 24 horas del da, los 7 das de la semana.   Si tiene un problema urgente y no puede comunicarse con nosotros, puede optar por buscar atencin mdica  en el consultorio de su doctor(a), en una clnica privada, en un centro de atencin urgente o en una sala de emergencias.  Si tiene una emergencia mdica, por favor llame inmediatamente al 911 o vaya a la sala de emergencias.  Nmeros de bper  - Dr. Kowalski: [phone]  - Dra. Moye: [phone]  - Dra. Stewart: [phone]  En caso de inclemencias del tiempo, por favor llame a nuestra lnea principal al [phone] para una actualizacin sobre el estado de cualquier retraso o cierre.  Consejos para la medicacin en dermatologa: Por favor, guarde las cajas en las que vienen los medicamentos de uso tpico para ayudarle a seguir las instrucciones sobre dnde y cmo usarlos. Las farmacias generalmente imprimen las instrucciones del medicamento slo en las cajas y no directamente en los tubos del medicamento.   Si su medicamento es muy caro, por favor, pngase en contacto con nuestra oficina llamando al [phone] y presione la opcin 4 o envenos un mensaje a travs de MyChart.   No podemos decirle cul ser su copago por los medicamentos por adelantado ya que esto es diferente dependiendo de la cobertura de su seguro. Sin embargo, es posible que podamos encontrar un medicamento sustituto a menor costo o llenar un formulario para que el  seguro cubra el medicamento que se considera necesario.   Si se requiere una autorizacin previa para que su compaa de seguros cubra su medicamento, por favor permtanos de 1 a 2 das hbiles para completar este proceso.  Los precios de los medicamentos varan con frecuencia dependiendo del lugar de dnde se surte la receta y alguna farmacias pueden ofrecer precios ms baratos.  El sitio web www.goodrx.com tiene cupones para medicamentos de diferentes farmacias. Los precios aqu no tienen en cuenta lo que podra costar con la ayuda del seguro (puede ser ms barato con su seguro), pero el sitio web puede darle el precio si no utiliz ningn seguro.  - Puede imprimir el cupn correspondiente y llevarlo con su receta a la farmacia.  - Tambin puede pasar por nuestra oficina durante el horario de atencin regular y recoger una tarjeta de cupones de GoodRx.  - Si necesita que su receta se enve electrnicamente a una farmacia diferente, informe a nuestra oficina a travs de MyChart de [facility]   o por telfono llamando al [phone] y presione la opcin 4.  

## 2022-12-26 ENCOUNTER — Ambulatory Visit
Admission: RE | Admit: 2022-12-26 | Discharge: 2022-12-26 | Disposition: A | Discharge status: Home / Self Care | Payer: Medicare Other | Source: Ambulatory Visit | Attending: Orthopaedic Surgery | Admitting: Orthopaedic Surgery

## 2022-12-26 DIAGNOSIS — Z09 Encounter for follow-up examination after completed treatment for conditions other than malignant neoplasm: Secondary | ICD-10-CM

## 2022-12-26 DIAGNOSIS — M25411 Effusion, right shoulder: Secondary | ICD-10-CM | POA: Diagnosis not present

## 2022-12-26 DIAGNOSIS — M19011 Primary osteoarthritis, right shoulder: Secondary | ICD-10-CM | POA: Diagnosis not present

## 2023-01-04 ENCOUNTER — Encounter: Payer: Self-pay | Admitting: Dermatology

## 2023-01-05 ENCOUNTER — Ambulatory Visit: Payer: Medicare Other | Admitting: Family Medicine

## 2023-01-05 DIAGNOSIS — N281 Cyst of kidney, acquired: Secondary | ICD-10-CM | POA: Diagnosis not present

## 2023-01-05 DIAGNOSIS — R3121 Asymptomatic microscopic hematuria: Secondary | ICD-10-CM | POA: Diagnosis not present

## 2023-01-05 DIAGNOSIS — N200 Calculus of kidney: Secondary | ICD-10-CM | POA: Diagnosis not present

## 2023-01-07 ENCOUNTER — Ambulatory Visit: Payer: Medicare Other | Attending: Cardiovascular Disease

## 2023-01-07 DIAGNOSIS — Z5181 Encounter for therapeutic drug level monitoring: Secondary | ICD-10-CM | POA: Diagnosis not present

## 2023-01-07 DIAGNOSIS — I480 Paroxysmal atrial fibrillation: Secondary | ICD-10-CM | POA: Insufficient documentation

## 2023-01-07 LAB — POCT INR: INR: 2 (ref 2.0–3.0)

## 2023-01-07 NOTE — Patient Instructions (Signed)
Description   Take 5mg  today and then continue taking warfarin 1.25mg  daily except  2.5mg  on Mondays, Wednesdays and Fridays. Recheck INR in 6 weeks.  Call with any new medications or procedure/surgery dates. [facility] [phone].  Fax number for shoulder procedure clearance [phone].

## 2023-01-13 ENCOUNTER — Ambulatory Visit (INDEPENDENT_AMBULATORY_CARE_PROVIDER_SITE_OTHER): Payer: Medicare Other | Admitting: Family Medicine

## 2023-01-13 ENCOUNTER — Encounter: Payer: Self-pay | Admitting: Family Medicine

## 2023-01-13 VITALS — BP 142/80 | HR 69 | Temp 97.6°F | Resp 18 | Ht 68.0 in | Wt 179.1 lb

## 2023-01-13 DIAGNOSIS — I2510 Atherosclerotic heart disease of native coronary artery without angina pectoris: Secondary | ICD-10-CM | POA: Diagnosis not present

## 2023-01-13 DIAGNOSIS — I480 Paroxysmal atrial fibrillation: Secondary | ICD-10-CM

## 2023-01-13 DIAGNOSIS — E7800 Pure hypercholesterolemia, unspecified: Secondary | ICD-10-CM | POA: Diagnosis not present

## 2023-01-13 DIAGNOSIS — R269 Unspecified abnormalities of gait and mobility: Secondary | ICD-10-CM

## 2023-01-13 DIAGNOSIS — C160 Malignant neoplasm of cardia: Secondary | ICD-10-CM | POA: Diagnosis not present

## 2023-01-13 DIAGNOSIS — M19011 Primary osteoarthritis, right shoulder: Secondary | ICD-10-CM | POA: Diagnosis not present

## 2023-01-13 NOTE — Patient Instructions (Signed)
Welcome to [facility]! It was a pleasure meeting you today.  As discussed, Please schedule a 12 month follow up visit today.  Talk to Dr. Cristal Deer about if ok to stop coumadin for 1 week for surgery or if wants to do injections daily while off. (Lovenox)  PLEASE NOTE:  If you had any LAB tests please let us know if you have not heard back within a few days. You may see your results on MyChart before we have a chance to review them but we will give you a call once they are reviewed by [place]. If we ordered any REFERRALS today, please let us know if you have not heard from their office within the next week.  Let us know through MyChart if you are needing REFILLS, or have your pharmacy send [place] the request. You can also use MyChart to communicate with me or any office staff.  Please try these tips to maintain a healthy lifestyle:  Eat most of your calories during the day when you are active. Eliminate processed foods including packaged sweets (pies, cakes, cookies), reduce intake of potatoes, white bread, white pasta, and white rice. Look for whole grain options, oat flour or almond flour.  Each meal should contain half fruits/vegetables, one quarter protein, and one quarter carbs (no bigger than a computer mouse).  Cut down on sweet beverages. This includes juice, soda, and sweet tea. Also watch fruit intake, though this is a healthier sweet option, it still contains natural sugar! Limit to 3 servings daily.  Drink at least 1 glass of water with each meal and aim for at least 8 glasses per day  Exercise at least 150 minutes every week.

## 2023-01-13 NOTE — Progress Notes (Unsigned)
New Patient Office Visit  Subjective:  Patient ID: Larry Bautista, male    DOB: [birth year]  Age: 82 y.o. MRN: [id number]  CC:  Chief Complaint  Patient presents with   Establish Care    Initial visit to establish care with new pcp     HPI Larry Bautista presents for new patient coronary artery disease.   CAD-stent 2004 LAD. Active.  Some dyspnea on exertion w/stairs only(has 3 sets at home).  Blood pressure has been low in past, so not on medications.   Atrial fibrillation-on coumadin long time.  No excessive bleeding.  Concerns w/coming off for surgery HLD-on crestor 20 mg. No problems Right shoulder - needing surgery.  Concerned about coming off coumadin Balance issues since chemo in 2012 and loss of smell/taste.  Was told not from chemo. Has fallen 4x in past several years.  Nothing recent.  Has osteoarthritis in C spine   Current Outpatient Medications:    acetaminophen (TYLENOL) 325 MG tablet, Take 2 tablets (650 mg total) by mouth QID. (Patient taking differently: Take 325 mg by mouth at bedtime.), Disp:  , Rfl:    cetirizine (ZYRTEC) 10 MG tablet, Take 10 mg by mouth daily., Disp: , Rfl:    diclofenac Sodium (VOLTAREN) 1 % GEL, Apply topically 4 (four) times daily., Disp: , Rfl:    diphenhydramine-acetaminophen (TYLENOL PM) 25-500 MG TABS tablet, Take 1 tablet by mouth at bedtime as needed., Disp: , Rfl:    eplerenone (INSPRA) 25 MG tablet, Take 1 tablet (25 mg total) by mouth every Monday, Wednesday, and Friday., Disp: 45 tablet, Rfl: 3   fluticasone (FLONASE) 50 MCG/ACT nasal spray, USE 2 SPRAYS IN EACH NOSTRIL DAILY, Disp: 48 g, Rfl: 3   gabapentin (NEURONTIN) 100 MG capsule, Take 100 mg by mouth daily., Disp: , Rfl:    nitroGLYCERIN (NITROSTAT) 0.4 MG SL tablet, Place 1 tablet (0.4 mg total) under the tongue every 5 (five) minutes as needed., Disp: 25 tablet, Rfl: 2   psyllium (METAMUCIL) 58.6 % powder, Take 1 packet by mouth daily. , Disp: , Rfl:    rosuvastatin  (CRESTOR) 20 MG tablet, Take 1 tablet (20 mg total) by mouth daily. Please call [phone] to schedule an appointment for future refills. Thank you, Disp: 90 tablet, Rfl: 0   tamsulosin (FLOMAX) 0.4 MG CAPS capsule, Take 0.4 mg by mouth daily after supper., Disp: , Rfl:    tiZANidine (ZANAFLEX) 4 MG tablet, Take 4 mg by mouth daily., Disp: , Rfl:    vitamin C (ASCORBIC ACID) 500 MG tablet, Take 500 mg by mouth daily., Disp: , Rfl:    warfarin (COUMADIN) 2.5 MG tablet, TAKE 1/2 TABLET ON SUNDAY, TUESDAY, THURSDAY, AND SATURDAY OR AS DIRECTED BY COUMADIN CLINIC, Disp: 30 tablet, Rfl: 1   warfarin (COUMADIN) 5 MG tablet, TAKE 1/2 TABLET ON MONDAY, WEDNESDAY ANDFRIDAY OR AS DIRECTED BY COUMADIN CLINIC, Disp: 30 tablet, Rfl: 1  Past Medical History:  Diagnosis Date   Allergy    Arthritis    Blood transfusion without reported diagnosis    Cancer (HCC)    esoph-chemo and rad   CHF (congestive heart failure) (HCC)    Chronic knee pain    Coronary artery disease    with LAD DE stent 2004   Dysplastic nevus 03/26/2016   right mid med calf   Dysrhythmia    Heel spur, left    Hyperlipidemia    Hypertension    Kidney stones    Lower  back pain    status post surgery for spondylolisthesis   Migraine    Persistent atrial fibrillation (HCC)    longstanding persistent (since 05/2008)   Plantar fasciitis     Past Surgical History:  Procedure Laterality Date   BACK SURGERY     spondylolisthesis   CARDIAC CATHETERIZATION  03/06/2003   CORONARY ANGIOPLASTY WITH STENT PLACEMENT  04/09/2003   CYPHER stent implantation in the LAD   ESOPHAGOGASTRODUODENOSCOPY (EGD) WITH PROPOFOL N/A 01/07/2018   Procedure: ESOPHAGOGASTRODUODENOSCOPY (EGD) WITH PROPOFOL;  Surgeon: Graylin Shiver, MD;  Location: [facility] ENDOSCOPY;  Service: Endoscopy;  Laterality: N/A;   HAND SURGERY Right 09/08/2012   carpal tunnel   HERNIA REPAIR     INGUINAL HERNIA REPAIR Right 04/26/2020   Procedure: RIGHT INGUINAL HERNIA REPAIR  WITH MESH;  Surgeon: Abigail Miyamoto, MD;  Location: [facility];  Service: General;  Laterality: Right;   SKIN CANCER EXCISION  05/10/1979   Face   SPINE SURGERY  2000   VASCULAR SURGERY     Vascular Stent    Family History  Problem Relation Age of Onset   Lung cancer Mother    Cancer Mother    Heart attack Father    Colon cancer Father    Congestive Heart Failure Father    Cancer Father    Atrial fibrillation Brother    Stroke Brother    Bladder Cancer Brother    Cancer Brother    COPD Brother    Atrial fibrillation Brother    COPD Brother    Congestive Heart Failure Brother    Lung cancer Brother    Arthritis Brother    Cancer Brother    Migraines Daughter     Social History   Socioeconomic History   Marital status: Married    Spouse name: Kellyn Rudy   Number of children: 1   Years of education: Not on file   Highest education level: Bachelor's degree (e.g., BA, AB, BS)  Occupational History   Occupation: retired  Tobacco Use   Smoking status: Former    Packs/day: 2.00    Years: 35.00    Additional pack years: 0.00    Total pack years: 70.00    Types: Cigarettes    Start date: 09/08/1954    Quit date: 11/06/1989    Years since quitting: 33.2   Smokeless tobacco: Never   Tobacco comments:    Smoked from [phone] (35 years)  Vaping Use   Vaping Use: Never used  Substance and Sexual Activity   Alcohol use: Not Currently    Comment: 1961-2009 (Last drink 2009)    Drug use: No   Sexual activity: Yes    Birth control/protection: None  Other Topics Concern   Not on file  Social History Narrative   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 Has one daughter, and 4 grandchildren. He lives in [place].   Social Determinants of Health   Financial Resource Strain: Low Risk  (07/25/2021)   Overall Financial Resource Strain (CARDIA)    Difficulty of Paying Living Expenses: Not hard at all  Food  Insecurity: No Food Insecurity (07/25/2021)   Hunger Vital Sign    Worried About Running Out of Food in the Last Year: Never true    Ran Out of Food in the Last Year: Never true  Transportation Needs: No Transportation Needs (07/25/2021)   PRAPARE - Administrator, Civil Service (Medical): No  Lack of Transportation (Non-Medical): No  Physical Activity: Unknown (07/25/2021)   Exercise Vital Sign    Days of Exercise per Week: Not on file    Minutes of Exercise per Session: 0 min  Stress: No Stress Concern Present (07/25/2021)   [employer] of Occupational Health - Occupational Stress Questionnaire    Feeling of Stress : Not at all  Social Connections: Moderately Isolated (07/25/2021)   Social Connection and Isolation Panel [NHANES]    Frequency of Communication with Friends and Family: Once a week    Frequency of Social Gatherings with Friends and Family: Never    Attends Religious Services: More than 4 times per year    Active Member of [employer] or Organizations: No    Attends Banker Meetings: Never    Marital Status: Married  Catering manager Violence: Not At Risk (07/25/2021)   Humiliation, Afraid, Rape, and Kick questionnaire    Fear of Current or Ex-Partner: No    Emotionally Abused: No    Physically Abused: No    Sexually Abused: No    ROS  ROS: Gen: no fever, chills  Skin: no rash, itching UJW:[id number] allergies. zyrtec Eyes: no blurry vision, double vision Resp: no  wheeze,+  chrronic cough from esophageal cancer. And dyspnea on exertion w/stairs CV: occasional CP left chest-resolves w/holding pressure on chest-Card aware.  At rest.  No edema GI: no heartburn, n/v/d/c, abd pain.  Some bloating-takes metamucil daily.  GU: no dysuria, urgency, frequency, hematuria MSK: shoulder right.  Neuro: no  headache, weakness, vertigo.  If leans head back, dizziness.  Psych: no depression, anxiety,SI.   Hard to fall asleep-can take 1 hour. Wakes up  4-5am to pee.  Tylenol pm helps some.    Objective:   Today's Vitals: BP (!) 142/80 (BP Location: Left Arm, Patient Position: Sitting, Cuff Size: Normal)   Pulse 69   Temp 97.6 F (36.4 C) (Temporal)   Resp 18   Ht 5\' 8"  (1.727 m)   Wt 179 lb 2 oz (81.3 kg)   SpO2 95%   BMI 27.24 kg/m   Physical Exam  Gen: WDWN NAD wm HEENT: NCAT, conjunctiva not injected, sclera nonicteric NECK:  supple, no thyromegaly, no nodes, no carotid bruits CARDIAC: RRR, S1S2+, no murmur. DP 2+B LUNGS: CTAB. No wheezes ABDOMEN:  BS+, soft, NTND, No HSM, no masses EXT:  no edema MSK: no gross abnormalities.  NEURO: A&O x3.  CN II-XII intact.  PSYCH: normal mood. Good eye contact   Assessment & Plan:  CAD in native artery Assessment & Plan: Chronic.  Controlled.  LDL <70.  Followed by Card   AF (paroxysmal atrial fibrillation) (HCC) Assessment & Plan: Chronic.  On coumadin.  Controlled.  Followed by Card and coumadin clinic   Pure hypercholesterolemia Assessment & Plan: Chronic.  Controlled.  Continue crestor 20mg    Primary osteoarthritis of right shoulder Assessment & Plan: Chronic.  Going to have surgery.  Medically stable.  Will need Card clearance   Gait disorder Assessment & Plan: Chronic.  Possibly from chemo, also w/DJD.  Declines physical therapy.  Uses [facility]   Adenocarcinoma of gastroesophageal junction (HCC) Assessment & Plan: Chronic.  No new symptoms     Follow-up: Return in about 1 year (around 01/13/2024) for chronic follow-up.   Angelena Sole, MD

## 2023-01-14 DIAGNOSIS — R269 Unspecified abnormalities of gait and mobility: Secondary | ICD-10-CM | POA: Insufficient documentation

## 2023-01-14 NOTE — Assessment & Plan Note (Signed)
Chronic.  On coumadin.  Controlled.  Followed by Card and coumadin clinic

## 2023-01-14 NOTE — Assessment & Plan Note (Signed)
Chronic.  Controlled.  LDL <70.  Followed by Card

## 2023-01-14 NOTE — Assessment & Plan Note (Signed)
Chronic.  Possibly from chemo, also w/DJD.  Declines physical therapy.  Uses [employer] [facility]

## 2023-01-14 NOTE — Assessment & Plan Note (Signed)
Chronic.  No new symptoms.

## 2023-01-14 NOTE — Assessment & Plan Note (Signed)
Chronic.  Controlled.  Continue crestor 20mg 

## 2023-01-14 NOTE — Assessment & Plan Note (Signed)
Chronic.  Going to have surgery.  Medically stable.  Will need Card clearance

## 2023-01-20 ENCOUNTER — Encounter (HOSPITAL_BASED_OUTPATIENT_CLINIC_OR_DEPARTMENT_OTHER): Payer: Self-pay

## 2023-01-20 DIAGNOSIS — I480 Paroxysmal atrial fibrillation: Secondary | ICD-10-CM

## 2023-01-20 MED ORDER — EPLERENONE 25 MG PO TABS: 25.0000 mg | ORAL_TABLET | ORAL | 3 refills | Status: DC

## 2023-01-21 ENCOUNTER — Other Ambulatory Visit: Payer: Self-pay | Admitting: *Deleted

## 2023-01-21 ENCOUNTER — Telehealth: Payer: Self-pay | Admitting: *Deleted

## 2023-01-21 DIAGNOSIS — Z5181 Encounter for therapeutic drug level monitoring: Secondary | ICD-10-CM

## 2023-01-21 MED ORDER — CETIRIZINE HCL 10 MG PO TABS: 10.0000 mg | ORAL_TABLET | Freq: Every day | ORAL | 3 refills | Status: AC

## 2023-01-21 MED ORDER — WARFARIN SODIUM 5 MG PO TABS
ORAL_TABLET | ORAL | 1 refills | Status: DC
Start: 2023-01-21 — End: 2024-01-19

## 2023-01-21 MED ORDER — ROSUVASTATIN CALCIUM 20 MG PO TABS: 20.0000 mg | ORAL_TABLET | Freq: Every day | ORAL | 0 refills | Status: DC

## 2023-01-21 MED ORDER — WARFARIN SODIUM 2.5 MG PO TABS
ORAL_TABLET | ORAL | 1 refills | Status: DC
Start: 2023-01-21 — End: 2023-06-02

## 2023-01-21 NOTE — Telephone Encounter (Signed)
Left message on machine for pt to contact the office to set up tele clearance and lab appt.

## 2023-01-21 NOTE — Addendum Note (Signed)
Addended by: Memory Dance on: 01/21/2023 08:52 AM   Modules accepted: Orders

## 2023-01-21 NOTE — Telephone Encounter (Signed)
S/w pt and spouse. Scheduled for tele clearance.  Med rec and consent done. CBC placed in system and released.  Will get lab drawn at [facility] the last week in May.     Patient Consent for Virtual Visit         Larry Bautista has provided verbal consent on 01/21/2023 for a virtual visit (video or telephone).   CONSENT FOR VIRTUAL VISIT FOR:  Larry Bautista  By participating in this virtual visit I agree to the following:  I hereby voluntarily request, consent and authorize [facility] and its employed or contracted physicians, physician assistants, nurse practitioners or other licensed health care professionals (the Practitioner), to provide me with telemedicine health care services (the "Services") as deemed necessary by the treating Practitioner. I acknowledge and consent to receive the Services by the Practitioner via telemedicine. I understand that the telemedicine visit will involve communicating with the Practitioner through live audiovisual communication technology and the disclosure of certain medical information by electronic transmission. I acknowledge that I have been given the opportunity to request an in-person assessment or other available alternative prior to the telemedicine visit and am voluntarily participating in the telemedicine visit.  I understand that I have the right to withhold or withdraw my consent to the use of telemedicine in the course of my care at any time, without affecting my right to future care or treatment, and that the Practitioner or I may terminate the telemedicine visit at any time. I understand that I have the right to inspect all information obtained and/or recorded in the course of the telemedicine visit and may receive copies of available information for a reasonable fee.  I understand that some of the potential risks of receiving the Services via telemedicine include:  Delay or interruption in medical evaluation due to technological equipment  failure or disruption; Information transmitted may not be sufficient (e.g. poor resolution of images) to allow for appropriate medical decision making by the Practitioner; and/or  In rare instances, security protocols could fail, causing a breach of personal health information.  Furthermore, I acknowledge that it is my responsibility to provide information about my medical history, conditions and care that is complete and accurate to the best of my ability. I acknowledge that Practitioner's advice, recommendations, and/or decision may be based on factors not within their control, such as incomplete or inaccurate data provided by me or distortions of diagnostic images or specimens that may result from electronic transmissions. I understand that the practice of medicine is not an exact science and that Practitioner makes no warranties or guarantees regarding treatment outcomes. I acknowledge that a copy of this consent can be made available to me via my patient portal [facility] MyChart), or I can request a printed copy by calling the office of [facility].    I understand that my insurance will be billed for this visit.   I have read or had this consent read to me. I understand the contents of this consent, which adequately explains the benefits and risks of the Services being provided via telemedicine.  I have been provided ample opportunity to ask questions regarding this consent and the Services and have had my questions answered to my satisfaction. I give my informed consent for the services to be provided through the use of telemedicine in my medical care

## 2023-01-21 NOTE — Addendum Note (Signed)
Addended by: Candie Chroman on: 01/21/2023 07:52 AM   Modules accepted: Orders

## 2023-01-21 NOTE — Telephone Encounter (Signed)
Covering for [facility].  Can you confirm dosage for coumadin for refills please

## 2023-01-21 NOTE — Telephone Encounter (Signed)
Primary Cardiologist:Bridgette Cristal Deer, MD   Preoperative team, please contact this patient and set up a phone call appointment for further preoperative risk assessment. Please obtain consent and complete medication review. Thank you for your help.   Patient needs to come in for lab work for CBC. He may hold warfarin for 5 days prior to surgery. He will not need bridging with Lovenox.    Larry Aland, NP-C  01/21/2023, 8:02 AM [address], [address] Office [phone] Fax [phone]

## 2023-01-21 NOTE — Telephone Encounter (Signed)
Prescription refill request received for warfarin Lov: 11/18/22 Cristal Deer)  Next INR check: 02/17/23 Warfarin tablet strength: 2.5mg  & 5mg   Appropriate dose. Refill sent.

## 2023-02-04 DIAGNOSIS — Z5181 Encounter for therapeutic drug level monitoring: Secondary | ICD-10-CM | POA: Diagnosis not present

## 2023-02-04 LAB — CBC WITH DIFFERENTIAL/PLATELET
Basophils Absolute: 0.1 10*3/uL (ref 0.0–0.2)
Basos: 0 %
EOS (ABSOLUTE): 0.1 10*3/uL (ref 0.0–0.4)
Eos: 1 %
Hematocrit: 45.8 % (ref 37.5–51.0)
Hemoglobin: 15.3 g/dL (ref 13.0–17.7)
Immature Grans (Abs): 0 10*3/uL (ref 0.0–0.1)
Immature Granulocytes: 0 %
Lymphocytes Absolute: 1.5 10*3/uL (ref 0.7–3.1)
Lymphs: 9 %
MCH: 31.5 pg (ref 26.6–33.0)
MCHC: 33.4 g/dL (ref 31.5–35.7)
MCV: 94 fL (ref 79–97)
Monocytes Absolute: 1.6 10*3/uL — ABNORMAL HIGH (ref 0.1–0.9)
Monocytes: 10 %
Neutrophils Absolute: 12.6 10*3/uL — ABNORMAL HIGH (ref 1.4–7.0)
Neutrophils: 80 %
Platelets: 274 10*3/uL (ref 150–450)
RBC: 4.85 x10E6/uL (ref 4.14–5.80)
RDW: 11.9 % (ref 11.6–15.4)
WBC: 16 10*3/uL — ABNORMAL HIGH (ref 3.4–10.8)

## 2023-02-05 ENCOUNTER — Ambulatory Visit: Payer: Medicare Other | Attending: Cardiology

## 2023-02-05 DIAGNOSIS — Z01810 Encounter for preprocedural cardiovascular examination: Secondary | ICD-10-CM | POA: Diagnosis not present

## 2023-02-05 NOTE — Progress Notes (Signed)
Virtual Visit via Telephone Note   Because of Larry Bautista co-morbid illnesses, he is at least at moderate risk for complications without adequate follow up.  This format is felt to be most appropriate for this patient at this time.  The patient did not have access to video technology/had technical difficulties with video requiring transitioning to audio format only (telephone).  All issues noted in this document were discussed and addressed.  No physical exam could be performed with this format.  Please refer to the patient's chart for his consent to telehealth for [facility].  Evaluation Performed:  Preoperative cardiovascular risk assessment _____________   Date:  02/05/2023   Patient ID:  Larry Bautista, DOB 11-02-1940, MRN [id number] Patient Location:  Home Provider location:   Office  Primary Care Provider:  Jeani Sow, MD Primary Cardiologist:  Jodelle Red, MD  Chief Complaint / Patient Profile   82 y.o. y/o male with a h/o atrial fibrillation, coronary artery disease, essential hypertension who is pending right reverse total shoulder replacement and presents today for telephonic preoperative cardiovascular risk assessment.  History of Present Illness    Larry Bautista is a 82 y.o. male who presents via audio/video conferencing for a telehealth visit today.  Pt was last seen in cardiology clinic on 11/18/2022 by Dr. Cristal Deer.  At that time Larry Bautista was doing well .  The patient is now pending procedure as outlined above. Since his last visit, he continues to be stable from a cardiac standpoint.  Today he denies chest pain, shortness of breath, lower extremity edema, fatigue, palpitations, melena, hematuria, hemoptysis, diaphoresis, weakness, presyncope, syncope, orthopnea, and PND.   Past Medical History    Past Medical History:  Diagnosis Date   Allergy    Arthritis    Blood transfusion without reported diagnosis    Cancer  (HCC)    esoph-chemo and rad   CHF (congestive heart failure) (HCC)    Chronic knee pain    Coronary artery disease    with LAD DE stent 2004   Dysplastic nevus 03/26/2016   right mid med calf   Dysrhythmia    Heel spur, left    Hyperlipidemia    Hypertension    Kidney stones    Lower back pain    status post surgery for spondylolisthesis   Migraine    Persistent atrial fibrillation (HCC)    longstanding persistent (since 05/2008)   Plantar fasciitis    Past Surgical History:  Procedure Laterality Date   BACK SURGERY     spondylolisthesis   CARDIAC CATHETERIZATION  03/06/2003   CORONARY ANGIOPLASTY WITH STENT PLACEMENT  04/09/2003   CYPHER stent implantation in the LAD   ESOPHAGOGASTRODUODENOSCOPY (EGD) WITH PROPOFOL N/A 01/07/2018   Procedure: ESOPHAGOGASTRODUODENOSCOPY (EGD) WITH PROPOFOL;  Surgeon: Graylin Shiver, MD;  [facility];  Service: Endoscopy;  Laterality: N/A;   HAND SURGERY Right 09/08/2012   carpal tunnel   HERNIA REPAIR     INGUINAL HERNIA REPAIR Right 04/26/2020   Procedure: RIGHT INGUINAL HERNIA REPAIR WITH MESH;  Surgeon: Abigail Miyamoto, MD;  Location: [facility];  Service: General;  Laterality: Right;   SKIN CANCER EXCISION  05/10/1979   Face   SPINE SURGERY  2000   VASCULAR SURGERY     Vascular Stent    Allergies  Allergies  Allergen Reactions   Celecoxib Rash   Lisinopril Cough    Other Reaction(s): cough   Simvastatin Other (See Comments)  Leg pain    Home Medications    Prior to Admission medications   Medication Sig Start Date End Date Taking? Authorizing Provider  acetaminophen (TYLENOL) 325 MG tablet Take 2 tablets (650 mg total) by mouth QID. Patient taking differently: Take 325 mg by mouth at bedtime. 08/25/19   Joseph Art, DO  cetirizine (ZYRTEC) 10 MG tablet Take 1 tablet (10 mg total) by mouth daily. 01/21/23   Jodelle Red, MD  diclofenac Sodium (VOLTAREN) 1 % GEL Apply topically 4  (four) times daily.    [provider]  diphenhydramine-acetaminophen (TYLENOL PM) 25-500 MG TABS tablet Take 1 tablet by mouth at bedtime as needed.    [provider]  eplerenone (INSPRA) 25 MG tablet Take 1 tablet (25 mg total) by mouth every Monday, Wednesday, and Friday. 01/21/23   Jodelle Red, MD  fluticasone [facility]) 50 MCG/ACT nasal spray USE 2 SPRAYS IN [facility] NOSTRIL DAILY 07/02/22   Malva Limes, MD  gabapentin (NEURONTIN) 100 MG capsule Take 100 mg by mouth daily. 06/25/21   [provider]  nitroGLYCERIN (NITROSTAT) 0.4 MG SL tablet Place 1 tablet (0.4 mg total) under the tongue every 5 (five) minutes as needed. 06/06/19   Lyn Records, MD  psyllium (METAMUCIL) 58.6 % powder Take 1 packet by mouth daily.     [provider]  rosuvastatin (CRESTOR) 20 MG tablet Take 1 tablet (20 mg total) by mouth daily. 01/21/23   Jodelle Red, MD  tamsulosin (FLOMAX) 0.4 MG CAPS capsule Take 0.4 mg by mouth daily after supper.    [provider]  tiZANidine (ZANAFLEX) 4 MG tablet Take 4 mg by mouth daily. 07/29/21   [provider]  vitamin C (ASCORBIC ACID) 500 MG tablet Take 500 mg by mouth daily.    [provider]  warfarin (COUMADIN) 2.5 MG tablet TAKE 1/2 TABLET ON SUNDAY, TUESDAY, THURSDAY, AND SATURDAY OR AS DIRECTED BY COUMADIN CLINIC 01/21/23   Jodelle Red, MD  warfarin (COUMADIN) 5 MG tablet TAKE 1/2 TABLET ON [place], AND FRIDAY 01/21/23   Jodelle Red, MD    Physical Exam    Vital Signs:  Larry Bautista does not have vital signs available for review today.  Given telephonic nature of communication, physical exam is limited. AAOx3. NAD. Normal affect.  Speech and respirations are unlabored.  Accessory Clinical Findings    None  Assessment & Plan    1.  Preoperative Cardiovascular Risk Assessment: Right reverse total shoulder replacement, Dr. Ramond Marrow, [facility], fax #[phone]      Primary Cardiologist: Jodelle Red, MD  Chart reviewed as part of pre-operative protocol coverage. Given past medical history and time since last visit, based on ACC/AHA guidelines, Larry Bautista would be at acceptable risk for the planned procedure without further cardiovascular testing.   His RCRI is a class III risk, 6.6% risk of major cardiac event.  He is able to complete greater than 4 METS of physical activity.  His warfarin may be held for 5 days prior to his surgery.  He will not need bridging with Lovenox.  Patient was advised that if he develops new symptoms prior to surgery to contact our office to arrange a follow-up appointment.  He verbalized understanding.  I will route this recommendation to the requesting party via Epic fax function and remove from pre-op pool.       Time:   Today, I have spent 5 minutes with the patient with  telehealth technology discussing medical history, symptoms, and management plan.  Prior to his phone evaluation I spent greater than 10 minutes reviewing his past medical history and cardiac medications.   Ronney Asters, NP  02/05/2023, 7:18 AM

## 2023-02-08 NOTE — Telephone Encounter (Signed)
Addressed via phone visit 02/05/23.   Alver Sorrow, NP

## 2023-02-17 ENCOUNTER — Ambulatory Visit: Payer: Medicare Other | Attending: Internal Medicine

## 2023-02-17 DIAGNOSIS — Z5181 Encounter for therapeutic drug level monitoring: Secondary | ICD-10-CM | POA: Diagnosis not present

## 2023-02-17 DIAGNOSIS — I480 Paroxysmal atrial fibrillation: Secondary | ICD-10-CM | POA: Insufficient documentation

## 2023-02-17 LAB — POCT INR: INR: 2 (ref 2.0–3.0)

## 2023-02-17 NOTE — Patient Instructions (Signed)
continue taking warfarin 1.25mg  daily except  2.5mg  on Mondays, Wednesdays and Fridays. Recheck INR in 6 weeks.  Call with any new medications or procedure/surgery dates. [facility] [phone].  Fax number for shoulder procedure clearance [phone].

## 2023-02-19 ENCOUNTER — Ambulatory Visit (INDEPENDENT_AMBULATORY_CARE_PROVIDER_SITE_OTHER): Payer: Medicare Other

## 2023-02-19 VITALS — Wt 179.0 lb

## 2023-02-19 DIAGNOSIS — Z0000 Encounter for general adult medical examination without abnormal findings: Secondary | ICD-10-CM

## 2023-02-19 NOTE — Patient Instructions (Signed)
[name] , Thank you for taking time to come for your Medicare Wellness Visit. I appreciate your ongoing commitment to your health goals. Please review the following plan we discussed and let me know if I can assist you in the future.   These are the goals we discussed:  Goals      Cut out extra servings     DIET - REDUCE PORTION SIZE     Recommend decreasing amount of portions sizes by half and eating 3 meals a day with 2 healthy snacks in between.      Increase water intake     Recommend to drink at least 6-8 8oz glasses of water per day.     Patient Stated     Lose weight         This is a list of the screening recommended for you and due dates:  Health Maintenance  Topic Date Due   DTaP/Tdap/Td vaccine (3 - Td or Tdap) 05/17/2022   COVID-19 Vaccine (6 - 2023-24 season) 04/28/2023*   Flu Shot  04/09/2023   Medicare Annual Wellness Visit  02/19/2024   Pneumonia Vaccine  Completed   Zoster (Shingles) Vaccine  Completed   HPV Vaccine  Aged Out  *Topic was postponed. The date shown is not the original due date.    Advanced directives: copies in chart   Conditions/risks identified: lose weight   Next appointment: Follow up in one year for your annual wellness visit.   Preventive Care 55 Years and Older, Male  Preventive care refers to lifestyle choices and visits with your health care provider that can promote health and wellness. What does preventive care include? A yearly physical exam. This is also called an annual well check. Dental exams once or twice a year. Routine eye exams. Ask your health care provider how often you should have your eyes checked. Personal lifestyle choices, including: Daily care of your teeth and gums. Regular physical activity. Eating a healthy diet. Avoiding tobacco and drug use. Limiting alcohol use. Practicing safe sex. Taking low doses of aspirin every day. Taking vitamin and mineral supplements as recommended by your health care  provider. What happens during an annual well check? The services and screenings done by your health care provider during your annual well check will depend on your age, overall health, lifestyle risk factors, and family history of disease. Counseling  Your health care provider may ask you questions about your: Alcohol use. Tobacco use. Drug use. Emotional well-being. Home and relationship well-being. Sexual activity. Eating habits. History of falls. Memory and ability to understand (cognition). Work and work Astronomer. Screening  You may have the following tests or measurements: Height, weight, and BMI. Blood pressure. Lipid and cholesterol levels. These may be checked every 5 years, or more frequently if you are over 71 years old. Skin check. Lung cancer screening. You may have this screening every year starting at age 61 if you have a 30-pack-year history of smoking and currently smoke or have quit within the past 15 years. Fecal occult blood test (FOBT) of the stool. You may have this test every year starting at age 79. Flexible sigmoidoscopy or colonoscopy. You may have a sigmoidoscopy every 5 years or a colonoscopy every 10 years starting at age 61. Prostate cancer screening. Recommendations will vary depending on your family history and other risks. Hepatitis C blood test. Hepatitis B blood test. Sexually transmitted disease (STD) testing. Diabetes screening. This is done by checking your blood sugar (glucose)  after you have not eaten for a while (fasting). You may have this done every 1-3 years. Abdominal aortic aneurysm (AAA) screening. You may need this if you are a current or former smoker. Osteoporosis. You may be screened starting at age 23 if you are at high risk. Talk with your health care provider about your test results, treatment options, and if necessary, the need for more tests. Vaccines  Your health care provider may recommend certain vaccines, such  as: Influenza vaccine. This is recommended every year. Tetanus, diphtheria, and acellular pertussis (Tdap, Td) vaccine. You may need a Td booster every 10 years. Zoster vaccine. You may need this after age 30. Pneumococcal 13-valent conjugate (PCV13) vaccine. One dose is recommended after age 37. Pneumococcal polysaccharide (PPSV23) vaccine. One dose is recommended after age 83. Talk to your health care provider about which screenings and vaccines you need and how often you need them. This information is not intended to replace advice given to you by your health care provider. Make sure you discuss any questions you have with your health care provider. Document Released: 09/21/2015 Document Revised: 05/14/2016 Document Reviewed: 06/26/2015 Elsevier Interactive Patient Education  2017 [place] Prevention in the Home Falls can cause injuries. They can happen to people of all ages. There are many things you can do to make your home safe and to help prevent falls. What can I do on the outside of my home? Regularly fix the edges of walkways and driveways and fix any cracks. Remove anything that might make you trip as you walk through a door, such as a raised step or threshold. Trim any bushes or trees on the path to your home. Use bright outdoor lighting. Clear any walking paths of anything that might make someone trip, such as rocks or tools. Regularly check to see if handrails are loose or broken. Make sure that both sides of any steps have handrails. Any raised decks and porches should have guardrails on the edges. Have any leaves, snow, or ice cleared regularly. Use sand or salt on walking paths during winter. Clean up any spills in your garage right away. This includes oil or grease spills. What can I do in the bathroom? Use night lights. Install grab bars by the toilet and in the tub and shower. Do not use towel bars as grab bars. Use non-skid mats or decals in the tub or  shower. If you need to sit down in the shower, use a plastic, non-slip stool. Keep the floor dry. Clean up any water that spills on the floor as soon as it happens. Remove soap buildup in the tub or shower regularly. Attach bath mats securely with double-sided non-slip rug tape. Do not have throw rugs and other things on the floor that can make you trip. What can I do in the bedroom? Use night lights. Make sure that you have a light by your bed that is easy to reach. Do not use any sheets or blankets that are too big for your bed. They should not hang down onto the floor. Have a firm chair that has side arms. You can use this for support while you get dressed. Do not have throw rugs and other things on the floor that can make you trip. What can I do in the kitchen? Clean up any spills right away. Avoid walking on wet floors. Keep items that you use a lot in easy-to-reach places. If you need to reach something above you, use a  strong step stool that has a grab bar. Keep electrical cords out of the way. Do not use floor polish or wax that makes floors slippery. If you must use wax, use non-skid floor wax. Do not have throw rugs and other things on the floor that can make you trip. What can I do with my stairs? Do not leave any items on the stairs. Make sure that there are handrails on both sides of the stairs and use them. Fix handrails that are broken or loose. Make sure that handrails are as long as the stairways. Check any carpeting to make sure that it is firmly attached to the stairs. Fix any carpet that is loose or worn. Avoid having throw rugs at the top or bottom of the stairs. If you do have throw rugs, attach them to the floor with carpet tape. Make sure that you have a light switch at the top of the stairs and the bottom of the stairs. If you do not have them, ask someone to add them for you. What else can I do to help prevent falls? Wear shoes that: Do not have high heels. Have  rubber bottoms. Are comfortable and fit you well. Are closed at the toe. Do not wear sandals. If you use a stepladder: Make sure that it is fully opened. Do not climb a closed stepladder. Make sure that both sides of the stepladder are locked into place. Ask someone to hold it for you, if possible. Clearly mark and make sure that you can see: Any grab bars or handrails. First and last steps. Where the edge of each step is. Use tools that help you move around (mobility aids) if they are needed. These include: Canes. Walkers. Scooters. Crutches. Turn on the lights when you go into a dark area. Replace any light bulbs as soon as they burn out. Set up your furniture so you have a clear path. Avoid moving your furniture around. If any of your floors are uneven, fix them. If there are any pets around you, be aware of where they are. Review your medicines with your doctor. Some medicines can make you feel dizzy. This can increase your chance of falling. Ask your doctor what other things that you can do to help prevent falls. This information is not intended to replace advice given to you by your health care provider. Make sure you discuss any questions you have with your health care provider. Document Released: 06/21/2009 Document Revised: 01/31/2016 Document Reviewed: 09/29/2014 Elsevier Interactive Patient Education  2017 [employer].

## 2023-02-19 NOTE — Progress Notes (Signed)
I connected with  Larry Bautista on 02/19/23 by a audio enabled telemedicine application and verified that I am speaking with the correct person using two identifiers.  Patient Location: Home  Provider Location: Office/Clinic  I discussed the limitations of evaluation and management by telemedicine. The patient expressed understanding and agreed to proceed.    Patient Medicare AWV questionnaire was completed by the patient on 02/15/23; I have confirmed that all information answered by patient is correct and no changes since this date.      Subjective:   Larry Bautista is a 82 y.o. male who presents for Medicare Annual/Subsequent preventive examination.  Review of Systems     Cardiac Risk Factors include: advanced age (>12men, >66 women);male gender;hypertension;dyslipidemia;sedentary lifestyle     Objective:    Today's Vitals   02/19/23 1300  Weight: 179 lb (81.2 kg)   Body mass index is 27.22 kg/m.     02/19/2023    1:07 PM 07/25/2021    2:56 PM 07/19/2020    2:50 PM 04/26/2020   12:02 PM 04/19/2020    2:55 PM 08/22/2019    6:17 AM 08/21/2019    5:03 PM  Advanced Directives  Does Patient Have a Medical Advance Directive? Yes Yes Yes Yes Yes Yes No  Type of Estate agent of [place];Living will Healthcare Power of [place];Living will Healthcare Power of [place];Living will  Healthcare Power of [place];Living will Living will;Healthcare Power of Attorney   Does patient want to make changes to medical advance directive? No - Patient declined Yes (Inpatient - patient defers changing a medical advance directive and declines information at this time)  No - Patient declined No - Patient declined No - Guardian declined   Copy of Healthcare Power of Attorney in Chart? Yes - validated most recent copy scanned in chart (See row information) Yes - validated most recent copy scanned in chart (See row information) Yes - validated most recent copy scanned in chart  (See row information)  Yes - validated most recent copy scanned in chart (See row information) No - copy requested   Would patient like information on creating a medical advance directive?      No - Patient declined No - Patient declined    Current Medications (verified) Outpatient Encounter Medications as of 02/19/2023  Medication Sig   acetaminophen (TYLENOL) 325 MG tablet Take 2 tablets (650 mg total) by mouth QID. (Patient taking differently: Take 325 mg by mouth at bedtime.)   cetirizine (ZYRTEC) 10 MG tablet Take 1 tablet (10 mg total) by mouth daily.   diclofenac Sodium (VOLTAREN) 1 % GEL Apply topically 4 (four) times daily.   diphenhydramine-acetaminophen (TYLENOL PM) 25-500 MG TABS tablet Take 1 tablet by mouth at bedtime as needed.   eplerenone (INSPRA) 25 MG tablet Take 1 tablet (25 mg total) by mouth every Monday, Wednesday, and Friday.   fluticasone (FLONASE) 50 MCG/ACT nasal spray USE 2 SPRAYS IN EACH NOSTRIL DAILY   gabapentin (NEURONTIN) 100 MG capsule Take 100 mg by mouth daily.   nitroGLYCERIN (NITROSTAT) 0.4 MG SL tablet Place 1 tablet (0.4 mg total) under the tongue every 5 (five) minutes as needed.   psyllium (METAMUCIL) 58.6 % powder Take 1 packet by mouth daily.    rosuvastatin (CRESTOR) 20 MG tablet Take 1 tablet (20 mg total) by mouth daily.   tamsulosin (FLOMAX) 0.4 MG CAPS capsule Take 0.4 mg by mouth daily after supper.   tiZANidine (ZANAFLEX) 4 MG tablet Take 4 mg  by mouth daily.   vitamin C (ASCORBIC ACID) 500 MG tablet Take 500 mg by mouth daily.   warfarin (COUMADIN) 2.5 MG tablet TAKE 1/2 TABLET ON SUNDAY, TUESDAY, THURSDAY, AND SATURDAY OR AS DIRECTED BY COUMADIN CLINIC   warfarin (COUMADIN) 5 MG tablet TAKE 1/2 TABLET ON MONDAY, WEDNESDAY, AND FRIDAY   No facility-administered encounter medications on file as of 02/19/2023.    Allergies (verified) Celecoxib, Lisinopril, and Simvastatin   History: Past Medical History:  Diagnosis Date   Allergy     Arthritis    Blood transfusion without reported diagnosis    Cancer (HCC)    esoph-chemo and rad   CHF (congestive heart failure) (HCC)    Chronic knee pain    Coronary artery disease    with LAD DE stent 2004   Dysplastic nevus 03/26/2016   right mid med calf   Dysrhythmia    Heel spur, left    Hyperlipidemia    Hypertension    Kidney stones    Lower back pain    status post surgery for spondylolisthesis   Migraine    Persistent atrial fibrillation (HCC)    longstanding persistent (since 05/2008)   Plantar fasciitis    Past Surgical History:  Procedure Laterality Date   BACK SURGERY     spondylolisthesis   CARDIAC CATHETERIZATION  03/06/2003   CORONARY ANGIOPLASTY WITH STENT PLACEMENT  04/09/2003   CYPHER stent implantation in the LAD   ESOPHAGOGASTRODUODENOSCOPY (EGD) WITH PROPOFOL N/A 01/07/2018   Procedure: ESOPHAGOGASTRODUODENOSCOPY (EGD) WITH PROPOFOL;  Surgeon: Graylin Shiver, MD;  Location: [facility] ENDOSCOPY;  Service: Endoscopy;  Laterality: N/A;   HAND SURGERY Right 09/08/2012   carpal tunnel   HERNIA REPAIR     INGUINAL HERNIA REPAIR Right 04/26/2020   Procedure: RIGHT INGUINAL HERNIA REPAIR WITH MESH;  Surgeon: Abigail Miyamoto, MD;  Location: [facility];  Service: General;  Laterality: Right;   SKIN CANCER EXCISION  05/10/1979   Face   SPINE SURGERY  2000   VASCULAR SURGERY     Vascular Stent   Family History  Problem Relation Age of Onset   Lung cancer Mother    Cancer Mother    Heart attack Father    Colon cancer Father    Congestive Heart Failure Father    Cancer Father    Atrial fibrillation Brother    Stroke Brother    Bladder Cancer Brother    Cancer Brother    COPD Brother    Atrial fibrillation Brother    COPD Brother    Congestive Heart Failure Brother    Lung cancer Brother    Arthritis Brother    Cancer Brother    Migraines Daughter    Social History   Socioeconomic History   Marital status: Married    Spouse name:  Jayvaughn Knippenberg   Number of children: 1   Years of education: Not on file   Highest education level: Bachelor's degree (e.g., BA, AB, BS)  Occupational History   Occupation: retired  Tobacco Use   Smoking status: Former    Packs/day: 2.00    Years: 35.00    Additional pack years: 0.00    Total pack years: 70.00    Types: Cigarettes    Start date: 09/08/1954    Quit date: 11/06/1989    Years since quitting: 33.3   Smokeless tobacco: Never   Tobacco comments:    Smoked from [phone] (35 years)  Vaping Use   Vaping Use: Never  used  Substance and Sexual Activity   Alcohol use: Not Currently    Comment: 1961-2009 (Last drink 2009)    Drug use: No   Sexual activity: Yes    Birth control/protection: None  Other Topics Concern   Not on file  Social History Narrative   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 Has one daughter, and 4 grandchildren. He lives in [place].   Social Determinants of Health   Financial Resource Strain: Low Risk  (02/15/2023)   Overall Financial Resource Strain (CARDIA)    Difficulty of Paying Living Expenses: Not hard at all  Food Insecurity: No Food Insecurity (02/15/2023)   Hunger Vital Sign    Worried About Running Out of Food in the Last Year: Never true    Ran Out of Food in the Last Year: Never true  Transportation Needs: No Transportation Needs (02/15/2023)   PRAPARE - Administrator, Civil Service (Medical): No    Lack of Transportation (Non-Medical): No  Physical Activity: Unknown (02/15/2023)   Exercise Vital Sign    Days of Exercise per Week: 0 days    Minutes of Exercise per Session: Not on file  Stress: No Stress Concern Present (02/15/2023)   [employer] of Occupational Health - Occupational Stress Questionnaire    Feeling of Stress : Only a little  Social Connections: Moderately Integrated (02/15/2023)   Social Connection and Isolation Panel [NHANES]    Frequency of Communication with  Friends and Family: Once a week    Frequency of Social Gatherings with Friends and Family: Never    Attends Religious Services: More than 4 times per year    Active Member of [employer] or Organizations: Yes    Attends Engineer, structural: More than 4 times per year    Marital Status: Married    Tobacco Counseling Counseling given: Not Answered Tobacco comments: Smoked from [phone] (35 years)   Clinical Intake:  Pre-visit preparation completed: Yes  Pain : No/denies pain     BMI - recorded: 27.22 Diabetes: No  How often do you need to have someone help you when you read instructions, pamphlets, or other written materials from your doctor or pharmacy?: 1 - Never  Diabetic?no  Interpreter Needed?: No  Information entered by :: Lanier Ensign, LPN   Activities of Daily Living    02/15/2023    8:55 AM  In your present state of health, do you have any difficulty performing the following activities:  Hearing? 0  Vision? 0  Difficulty concentrating or making decisions? 0  Walking or climbing stairs? 0  Dressing or bathing? 0  Doing errands, shopping? 0  Preparing Food and eating ? N  Using the Toilet? N  In the past six months, have you accidently leaked urine? Y  Comment at times  Do you have problems with loss of bowel control? N  Managing your Medications? N  Managing your Finances? N  Housekeeping or managing your Housekeeping? N    Patient Care Team: Maisie Fus, MD as PCP - General (Cardiology) Jodelle Red, MD as PCP - Cardiology (Cardiology) Jerilee Field, MD as Consulting Physician (Urology) Melina Fiddler, MD as Consulting Physician (Sports Medicine) Abigail Miyamoto, MD as Consulting Physician (General Surgery) Ladene Artist, MD as Consulting Physician (Oncology) Barnett Abu, MD as Consulting Physician (Neurosurgery) [facility] Lanier Prude, MD as Consulting Physician (Cardiology)  Indicate  any recent Medical Services you may  have received from other than [facility] providers in the past year (date may be approximate).     Assessment:   This is a routine wellness examination for [place].  Hearing/Vision screen Hearing Screening - Comments:: Pt wears hearing aids  Vision Screening - Comments:: Pt encouraged to follow up with provider   Dietary issues and exercise activities discussed: Current Exercise Habits: The patient does not participate in regular exercise at present   Goals Addressed             This Visit's Progress    Patient Stated       Lose weight        Depression Screen    02/19/2023    1:06 PM 01/13/2023    2:22 PM 07/25/2021    2:53 PM 07/19/2020    2:47 PM 09/16/2018    2:28 PM 01/19/2018    2:58 PM 11/12/2017    2:19 PM  PHQ 2/9 Scores  PHQ - 2 Score 0 0 2 0 0 0 0  PHQ- 9 Score 0 5 4        Fall Risk    02/15/2023    8:55 AM 01/13/2023    2:22 PM 07/25/2021    2:58 PM 07/19/2020    2:50 PM 09/16/2018    2:28 PM  Fall Risk   Falls in the past year? 1 1 1 1  0  Number falls in past yr: 1 1 0 1   Injury with Fall? 0 0 0 0   Risk for fall due to : History of fall(s);Impaired balance/gait;Impaired mobility;Impaired vision Impaired balance/gait;Impaired mobility History of fall(s);Impaired balance/gait;Impaired mobility    Follow up Falls prevention discussed Falls prevention discussed Falls prevention discussed Falls prevention discussed     FALL RISK PREVENTION PERTAINING TO THE HOME:  Any stairs in or around the home? Yes  If so, are there any without handrails? No  Home free of loose throw rugs in walkways, pet beds, electrical cords, etc? Yes  Adequate lighting in your home to reduce risk of falls? Yes   ASSISTIVE DEVICES UTILIZED TO PREVENT FALLS:  Life alert? No  Use of a cane, walker or w/c? Yes  Grab bars in the bathroom? Yes  Shower chair or bench in shower? No  Elevated toilet seat or a handicapped toilet? No   TIMED UP AND GO:  Was  the test performed? No .   Cognitive Function:        02/19/2023    1:10 PM 07/09/2016   10:25 AM  6CIT Screen  What Year? 0 points 0 points  What month? 0 points 0 points  What time? 0 points 0 points  Count back from 20 0 points 0 points  Months in reverse 4 points 0 points  Repeat phrase 0 points 0 points  Total Score 4 points 0 points    Immunizations Immunization History  Administered Date(s) Administered   Influenza, High Dose Seasonal PF 05/01/2016, 04/08/2017, 04/22/2018   Influenza-Unspecified 06/04/2015, 05/11/2020, 06/04/2021, 05/15/2022   Moderna Covid-19 Vaccine Bivalent Booster 79yrs & up 07/10/2021   Moderna SARS-COV2 Booster Vaccination 10/28/2020, 07/10/2022   Moderna Sars-Covid-2 Vaccination 12/07/2019, 01/04/2020, 07/09/2020, 12/26/2020   Pneumococcal Conjugate-13 03/28/2015   Pneumococcal Polysaccharide-23 02/08/2020   Td 05/17/2012   Tdap 05/17/2012   Zoster Recombinat (Shingrix) 06/21/2019, 01/11/2020   Zoster, Live 11/04/2005    TDAP status: Due, Education has been provided regarding the importance of this vaccine. Advised may receive this vaccine at local pharmacy or  Health Dept. Aware to provide a copy of the vaccination record if obtained from local pharmacy or Health Dept. Verbalized acceptance and understanding.  Flu Vaccine status: Up to date  Pneumococcal vaccine status: Up to date  Covid-19 vaccine status: Completed vaccines  Qualifies for Shingles Vaccine? Yes   Zostavax completed Yes   Shingrix Completed?: Yes  Screening Tests Health Maintenance  Topic Date Due   DTaP/Tdap/Td (3 - Td or Tdap) 05/17/2022   COVID-19 Vaccine (6 - 2023-24 season) 04/28/2023 (Originally 09/04/2022)   INFLUENZA VACCINE  04/09/2023   Medicare Annual Wellness (AWV)  02/19/2024   Pneumonia Vaccine 70+ Years old  Completed   Zoster Vaccines- Shingrix  Completed   HPV VACCINES  Aged Out    Health Maintenance  Health Maintenance Due  Topic Date Due    DTaP/Tdap/Td (3 - Td or Tdap) 05/17/2022    Colorectal cancer screening: No longer required.   Additional Screening:   Vision Screening: Recommended annual ophthalmology exams for early detection of glaucoma and other disorders of the eye. Is the patient up to date with their annual eye exam?  No  Who is the provider or what is the name of the office in which the patient attends annual eye exams? Encouraged to follow  If pt is not established with a provider, would they like to be referred to a provider to establish care? No .   Dental Screening: Recommended annual dental exams for proper oral hygiene  Community Resource Referral / Chronic Care Management: CRR required this visit?  No   CCM required this visit?  No      Plan:     I have personally reviewed and noted the following in the patient's chart:   Medical and social history Use of alcohol, tobacco or illicit drugs  Current medications and supplements including opioid prescriptions. Patient is not currently taking opioid prescriptions. Functional ability and status Nutritional status Physical activity Advanced directives List of other physicians Hospitalizations, surgeries, and ER visits in previous 12 months Vitals Screenings to include cognitive, depression, and falls Referrals and appointments  In addition, I have reviewed and discussed with patient certain preventive protocols, quality metrics, and best practice recommendations. A written personalized care plan for preventive services as well as general preventive health recommendations were provided to patient.     Marzella Schlein, LPN   1/61/0960   Nurse Notes: none

## 2023-02-25 ENCOUNTER — Telehealth: Payer: Self-pay | Admitting: Family Medicine

## 2023-02-25 NOTE — Telephone Encounter (Signed)
error 

## 2023-03-10 ENCOUNTER — Other Ambulatory Visit (HOSPITAL_BASED_OUTPATIENT_CLINIC_OR_DEPARTMENT_OTHER): Payer: Self-pay | Admitting: Cardiology

## 2023-03-11 NOTE — Care Plan (Signed)
Ortho Bundle Case Management Note  Patient Details  Name: Larry Bautista MRN: [id number] Date of Birth: June 23, 1941   Spoke with patient prior to surgery. Will discharge to home with family to assist. [facility] given at [facility]. [facility] Patient and MD in agreement with plan. Choice offered.                   DME Arranged:    DME Agency:     HH Arranged:    HH Agency:     Additional Comments: Please contact me with any questions of if this plan should need to change.  Shauna Hugh,  RN,BSN,MHA,CCM  [facility] Orthopaedic Specialist  [phone] 03/11/2023, 12:12 PM

## 2023-03-18 ENCOUNTER — Encounter (HOSPITAL_BASED_OUTPATIENT_CLINIC_OR_DEPARTMENT_OTHER): Payer: Self-pay | Admitting: Orthopaedic Surgery

## 2023-03-18 ENCOUNTER — Other Ambulatory Visit: Payer: Self-pay

## 2023-03-18 NOTE — Progress Notes (Signed)
   03/18/23 1002  PAT Phone Screen  Is the patient taking a GLP-1 receptor agonist? No  Do You Have Diabetes? No  Do You Have Hypertension? Yes  Have You Ever Been to the ER for Asthma? No  Have You Taken Oral Steroids in the Past 3 Months? No  Do you Take Phenteramine or any Other Diet Drugs? No  Recent  Lab Work, EKG, CXR? Yes  Where was this test performed? EKG 11/18/22, echo 08/23/2021  Do you have a history of heart problems? Yes  Cardiologist Name Dr. Cristal Deer  Have you ever had tests on your heart? Yes  What cardiac tests were performed? Cardiac Cath;Echo;EKG;Labs  What date/year were cardiac tests completed? EKG 11/18/22, echo 08/23/2021, cath 2004 (stent)  Results viewable: (S)   (in Chart Review)  Any Recent Hospitalizations? No  Height 5\' 7"  (1.702 m)  Weight 81.2 kg  Pat Appointment Scheduled Yes

## 2023-03-25 ENCOUNTER — Encounter (HOSPITAL_BASED_OUTPATIENT_CLINIC_OR_DEPARTMENT_OTHER)
Admission: RE | Admit: 2023-03-25 | Discharge: 2023-03-25 | Disposition: A | Discharge status: Home / Self Care | Payer: Medicare Other | Source: Ambulatory Visit | Attending: Orthopaedic Surgery | Admitting: Orthopaedic Surgery

## 2023-03-25 DIAGNOSIS — I509 Heart failure, unspecified: Secondary | ICD-10-CM | POA: Diagnosis not present

## 2023-03-25 DIAGNOSIS — Z01812 Encounter for preprocedural laboratory examination: Secondary | ICD-10-CM | POA: Diagnosis not present

## 2023-03-25 DIAGNOSIS — I2510 Atherosclerotic heart disease of native coronary artery without angina pectoris: Secondary | ICD-10-CM | POA: Diagnosis not present

## 2023-03-25 DIAGNOSIS — Z8501 Personal history of malignant neoplasm of esophagus: Secondary | ICD-10-CM | POA: Diagnosis not present

## 2023-03-25 DIAGNOSIS — Z9221 Personal history of antineoplastic chemotherapy: Secondary | ICD-10-CM | POA: Diagnosis not present

## 2023-03-25 DIAGNOSIS — Z87891 Personal history of nicotine dependence: Secondary | ICD-10-CM | POA: Diagnosis not present

## 2023-03-25 DIAGNOSIS — I482 Chronic atrial fibrillation, unspecified: Secondary | ICD-10-CM | POA: Diagnosis not present

## 2023-03-25 DIAGNOSIS — Z955 Presence of coronary angioplasty implant and graft: Secondary | ICD-10-CM | POA: Diagnosis not present

## 2023-03-25 DIAGNOSIS — M25711 Osteophyte, right shoulder: Secondary | ICD-10-CM | POA: Diagnosis not present

## 2023-03-25 DIAGNOSIS — E785 Hyperlipidemia, unspecified: Secondary | ICD-10-CM | POA: Diagnosis not present

## 2023-03-25 DIAGNOSIS — I4819 Other persistent atrial fibrillation: Secondary | ICD-10-CM | POA: Diagnosis not present

## 2023-03-25 DIAGNOSIS — I110 Hypertensive heart disease with heart failure: Secondary | ICD-10-CM | POA: Diagnosis not present

## 2023-03-25 DIAGNOSIS — G709 Myoneural disorder, unspecified: Secondary | ICD-10-CM | POA: Diagnosis not present

## 2023-03-25 DIAGNOSIS — Z79899 Other long term (current) drug therapy: Secondary | ICD-10-CM | POA: Diagnosis not present

## 2023-03-25 DIAGNOSIS — Z8261 Family history of arthritis: Secondary | ICD-10-CM | POA: Diagnosis not present

## 2023-03-25 DIAGNOSIS — M19011 Primary osteoarthritis, right shoulder: Secondary | ICD-10-CM | POA: Diagnosis not present

## 2023-03-25 DIAGNOSIS — Z7901 Long term (current) use of anticoagulants: Secondary | ICD-10-CM | POA: Diagnosis not present

## 2023-03-25 DIAGNOSIS — Z8249 Family history of ischemic heart disease and other diseases of the circulatory system: Secondary | ICD-10-CM | POA: Diagnosis not present

## 2023-03-25 DIAGNOSIS — Z923 Personal history of irradiation: Secondary | ICD-10-CM | POA: Diagnosis not present

## 2023-03-25 LAB — SURGICAL PCR SCREEN
MRSA, PCR: NEGATIVE
Staphylococcus aureus: NEGATIVE

## 2023-03-25 LAB — PROTIME-INR
INR: 1.2 (ref 0.8–1.2)
Prothrombin Time: 15.8 seconds — ABNORMAL HIGH (ref 11.4–15.2)

## 2023-03-25 NOTE — Progress Notes (Signed)
       Patient Instructions  The night before surgery:  No food after midnight. ONLY clear liquids after midnight  The day of surgery (if you do NOT have diabetes):  Drink ONE (1) Pre-Surgery Clear Ensure as directed.   This drink was given to you during your hospital  pre-op appointment visit. The pre-op nurse will instruct you on the time to drink the  Pre-Surgery Ensure depending on your surgery time. Finish the drink at the designated time by the pre-op nurse.  Nothing else to drink after completing the  Pre-Surgery Clear Ensure.  The day of surgery (if you have diabetes): Drink ONE (1) Gatorade 2 (G2) as directed. This drink was given to you during your hospital  pre-op appointment visit.  The pre-op nurse will instruct you on the time to drink the   Gatorade 2 (G2) depending on your surgery time. Color of the Gatorade may vary. Red is not allowed. Nothing else to drink after completing the  Gatorade 2 (G2).         If you have questions, please contact your surgeon's office.    Patient given BPO and education provided. Patient verbalized understanding.

## 2023-03-25 NOTE — Anesthesia Preprocedure Evaluation (Addendum)
Anesthesia Evaluation  Patient identified by MRN, date of birth, ID band Patient awake    Reviewed: Allergy & Precautions, NPO status , Patient's Chart, lab work & pertinent test results  History of Anesthesia Complications Negative for: history of anesthetic complications  Airway Mallampati: II  TM Distance: >3 FB Neck ROM: Full    Dental  (+) Caps, Dental Advisory Given   Pulmonary neg shortness of breath, neg sleep apnea, COPD, neg recent URI, former smoker Pulmonary nodule   Pulmonary exam normal breath sounds clear to auscultation       Cardiovascular hypertension, Pt. on medications (-) angina + CAD, + Cardiac Stents (DES to LAD 2004) and +CHF  (-) CABG + dysrhythmias Atrial Fibrillation  Rhythm:Irregular Rate:Normal  HLD  TTE 08/23/2021: IMPRESSIONS     1. Left ventricular ejection fraction, by estimation, is 60 to 65%. The  left ventricle has normal function. The left ventricle has no regional  wall motion abnormalities. Left ventricular diastolic parameters are  indeterminate.   2. Right ventricular systolic function is normal. The right ventricular  size is normal. There is normal pulmonary artery systolic pressure.   3. Left atrial size was moderately dilated.   4. Right atrial size was moderately dilated.   5. The mitral valve is normal in structure. Trivial mitral valve  regurgitation. No evidence of mitral stenosis.   6. The aortic valve is tricuspid. Aortic valve regurgitation is not  visualized. No aortic stenosis is present.   7. The inferior vena cava is normal in size with greater than 50%  respiratory variability, suggesting right atrial pressure of 3 mmHg.   8. Evidence of atrial level shunting detected by color flow Doppler.  There is a moderately sized patent foramen ovale with predominantly left  to right shunting across the atrial septum.     Neuro/Psych  Headaches, neg Seizures  Neuromuscular  disease (low back pain, spinal stenosis)    GI/Hepatic Neg liver ROS,neg GERD  ,,H/o esophageal cancer s/p chemo/radiation   Endo/Other  negative endocrine ROS    Renal/GU Renal disease (h/o kidney stones)     Musculoskeletal  (+) Arthritis , Osteoarthritis,    Abdominal   Peds  Hematology  (+) Blood dyscrasia, anemia   Anesthesia Other Findings Last warfarin: 7/11  Reproductive/Obstetrics                             Anesthesia Physical Anesthesia Plan  ASA: 3  Anesthesia Plan: General   Post-op Pain Management: Regional block* and Tylenol PO (pre-op)*   Induction: Intravenous  PONV Risk Score and Plan: 2 and Ondansetron, Dexamethasone and Treatment may vary due to age or medical condition  Airway Management Planned: Oral ETT  Additional Equipment:   Intra-op Plan:   Post-operative Plan: Extubation in OR  Informed Consent: I have reviewed the patients History and Physical, chart, labs and discussed the procedure including the risks, benefits and alternatives for the proposed anesthesia with the patient or authorized representative who has indicated his/her understanding and acceptance.     Dental advisory given  Plan Discussed with: Anesthesiologist and CRNA  Anesthesia Plan Comments: (Discussed potential risks of nerve blocks including, but not limited to, infection, bleeding, nerve damage, seizures, pneumothorax, respiratory depression, and potential failure of the block. Alternatives to nerve blocks discussed. All questions answered.  Risks of general anesthesia discussed including, but not limited to, sore throat, hoarse voice, chipped/damaged teeth, injury to vocal cords, nausea  and vomiting, allergic reactions, lung infection, heart attack, stroke, and death. All questions answered. )       Anesthesia Quick Evaluation

## 2023-03-26 ENCOUNTER — Ambulatory Visit (HOSPITAL_BASED_OUTPATIENT_CLINIC_OR_DEPARTMENT_OTHER): Payer: Medicare Other | Admitting: Anesthesiology

## 2023-03-26 ENCOUNTER — Other Ambulatory Visit: Payer: Self-pay

## 2023-03-26 ENCOUNTER — Ambulatory Visit (HOSPITAL_BASED_OUTPATIENT_CLINIC_OR_DEPARTMENT_OTHER)
Admission: RE | Admit: 2023-03-26 | Discharge: 2023-03-26 | Disposition: A | Discharge status: Home / Self Care | Payer: Medicare Other | Attending: Orthopaedic Surgery | Admitting: Orthopaedic Surgery

## 2023-03-26 ENCOUNTER — Encounter (HOSPITAL_BASED_OUTPATIENT_CLINIC_OR_DEPARTMENT_OTHER): Payer: Self-pay | Admitting: Orthopaedic Surgery

## 2023-03-26 ENCOUNTER — Encounter (HOSPITAL_BASED_OUTPATIENT_CLINIC_OR_DEPARTMENT_OTHER)
Admission: RE | Disposition: A | Discharge status: Home / Self Care | Payer: Self-pay | Source: Home / Self Care | Attending: Orthopaedic Surgery

## 2023-03-26 ENCOUNTER — Ambulatory Visit (HOSPITAL_COMMUNITY): Payer: Medicare Other

## 2023-03-26 DIAGNOSIS — E785 Hyperlipidemia, unspecified: Secondary | ICD-10-CM | POA: Insufficient documentation

## 2023-03-26 DIAGNOSIS — I2510 Atherosclerotic heart disease of native coronary artery without angina pectoris: Secondary | ICD-10-CM | POA: Diagnosis not present

## 2023-03-26 DIAGNOSIS — Z8261 Family history of arthritis: Secondary | ICD-10-CM | POA: Insufficient documentation

## 2023-03-26 DIAGNOSIS — M19011 Primary osteoarthritis, right shoulder: Secondary | ICD-10-CM | POA: Diagnosis not present

## 2023-03-26 DIAGNOSIS — Z87891 Personal history of nicotine dependence: Secondary | ICD-10-CM | POA: Insufficient documentation

## 2023-03-26 DIAGNOSIS — Z79899 Other long term (current) drug therapy: Secondary | ICD-10-CM | POA: Insufficient documentation

## 2023-03-26 DIAGNOSIS — I110 Hypertensive heart disease with heart failure: Secondary | ICD-10-CM | POA: Diagnosis not present

## 2023-03-26 DIAGNOSIS — Z01818 Encounter for other preprocedural examination: Secondary | ICD-10-CM

## 2023-03-26 DIAGNOSIS — Z8249 Family history of ischemic heart disease and other diseases of the circulatory system: Secondary | ICD-10-CM | POA: Insufficient documentation

## 2023-03-26 DIAGNOSIS — I10 Essential (primary) hypertension: Secondary | ICD-10-CM | POA: Diagnosis not present

## 2023-03-26 DIAGNOSIS — Z8501 Personal history of malignant neoplasm of esophagus: Secondary | ICD-10-CM | POA: Insufficient documentation

## 2023-03-26 DIAGNOSIS — Z96611 Presence of right artificial shoulder joint: Secondary | ICD-10-CM | POA: Diagnosis not present

## 2023-03-26 DIAGNOSIS — I509 Heart failure, unspecified: Secondary | ICD-10-CM | POA: Insufficient documentation

## 2023-03-26 DIAGNOSIS — I480 Paroxysmal atrial fibrillation: Secondary | ICD-10-CM | POA: Diagnosis not present

## 2023-03-26 DIAGNOSIS — G8918 Other acute postprocedural pain: Secondary | ICD-10-CM | POA: Diagnosis not present

## 2023-03-26 DIAGNOSIS — M25711 Osteophyte, right shoulder: Secondary | ICD-10-CM | POA: Diagnosis not present

## 2023-03-26 DIAGNOSIS — I4819 Other persistent atrial fibrillation: Secondary | ICD-10-CM | POA: Diagnosis not present

## 2023-03-26 DIAGNOSIS — Z7901 Long term (current) use of anticoagulants: Secondary | ICD-10-CM | POA: Insufficient documentation

## 2023-03-26 DIAGNOSIS — Z923 Personal history of irradiation: Secondary | ICD-10-CM | POA: Insufficient documentation

## 2023-03-26 DIAGNOSIS — G709 Myoneural disorder, unspecified: Secondary | ICD-10-CM | POA: Insufficient documentation

## 2023-03-26 DIAGNOSIS — Z9221 Personal history of antineoplastic chemotherapy: Secondary | ICD-10-CM | POA: Insufficient documentation

## 2023-03-26 DIAGNOSIS — I482 Chronic atrial fibrillation, unspecified: Secondary | ICD-10-CM

## 2023-03-26 DIAGNOSIS — Z955 Presence of coronary angioplasty implant and graft: Secondary | ICD-10-CM | POA: Insufficient documentation

## 2023-03-26 HISTORY — PX: REVERSE SHOULDER ARTHROPLASTY: SHX5054

## 2023-03-26 LAB — BASIC METABOLIC PANEL
Anion gap: 8 (ref 5–15)
BUN: 15 mg/dL (ref 8–23)
CO2: 22 mmol/L (ref 22–32)
Calcium: 9 mg/dL (ref 8.9–10.3)
Chloride: 108 mmol/L (ref 98–111)
Creatinine, Ser: 0.8 mg/dL (ref 0.61–1.24)
GFR, Estimated: >60 mL/min (ref >60)
Glucose, Bld: 100 mg/dL — ABNORMAL HIGH (ref 70–99)
Potassium: 3.6 mmol/L (ref 3.5–5.1)
Sodium: 138 mmol/L (ref 135–145)

## 2023-03-26 SURGERY — ARTHROPLASTY, SHOULDER, TOTAL, REVERSE
Anesthesia: General | Site: Shoulder | Laterality: Right

## 2023-03-26 MED ORDER — ACETAMINOPHEN 500 MG PO TABS
ORAL_TABLET | ORAL | Status: AC
  Filled 2023-03-26: qty 2

## 2023-03-26 MED ORDER — PHENYLEPHRINE HCL-NACL 20-0.9 MG/250ML-% IV SOLN
INTRAVENOUS | Status: DC | PRN
  Administered 2023-03-26: 50 ug/min via INTRAVENOUS

## 2023-03-26 MED ORDER — VANCOMYCIN HCL 1000 MG IV SOLR
INTRAVENOUS | Status: AC
  Filled 2023-03-26: qty 20

## 2023-03-26 MED ORDER — TRANEXAMIC ACID-NACL 1000-0.7 MG/100ML-% IV SOLN
INTRAVENOUS | Status: AC
  Filled 2023-03-26: qty 100

## 2023-03-26 MED ORDER — VANCOMYCIN HCL IN DEXTROSE 1-5 GM/200ML-% IV SOLN
1000.0000 mg | INTRAVENOUS | Status: AC
  Administered 2023-03-26: 1000 mg via INTRAVENOUS

## 2023-03-26 MED ORDER — BUPIVACAINE LIPOSOME 1.3 % IJ SUSP
INTRAMUSCULAR | Status: DC | PRN
  Administered 2023-03-26: 10 mL via PERINEURAL

## 2023-03-26 MED ORDER — SUGAMMADEX SODIUM 200 MG/2ML IV SOLN
INTRAVENOUS | Status: DC | PRN
  Administered 2023-03-26: 200 mg via INTRAVENOUS

## 2023-03-26 MED ORDER — LIDOCAINE HCL (CARDIAC) PF 100 MG/5ML IV SOSY
PREFILLED_SYRINGE | INTRAVENOUS | Status: DC | PRN
  Administered 2023-03-26: 100 mg via INTRAVENOUS

## 2023-03-26 MED ORDER — FENTANYL CITRATE (PF) 100 MCG/2ML IJ SOLN
INTRAMUSCULAR | Status: AC
  Filled 2023-03-26: qty 2

## 2023-03-26 MED ORDER — OXYCODONE HCL 5 MG PO TABS: 5.0000 mg | ORAL_TABLET | Freq: Once | ORAL | Status: DC | PRN

## 2023-03-26 MED ORDER — PROPOFOL 10 MG/ML IV BOLUS
INTRAVENOUS | Status: AC
  Filled 2023-03-26: qty 20

## 2023-03-26 MED ORDER — FENTANYL CITRATE (PF) 100 MCG/2ML IJ SOLN
INTRAMUSCULAR | Status: DC | PRN
  Administered 2023-03-26: 50 ug via INTRAVENOUS

## 2023-03-26 MED ORDER — BUPIVACAINE HCL (PF) 0.5 % IJ SOLN
INTRAMUSCULAR | Status: DC | PRN
  Administered 2023-03-26: 10 mL

## 2023-03-26 MED ORDER — PROPOFOL 10 MG/ML IV BOLUS
INTRAVENOUS | Status: DC | PRN
Start: 2023-03-26 — End: 2023-03-26
  Administered 2023-03-26: 80 mg via INTRAVENOUS

## 2023-03-26 MED ORDER — ONDANSETRON HCL 4 MG/2ML IJ SOLN
INTRAMUSCULAR | Status: DC | PRN
  Administered 2023-03-26: 4 mg via INTRAVENOUS

## 2023-03-26 MED ORDER — FENTANYL CITRATE (PF) 100 MCG/2ML IJ SOLN
50.0000 ug | Freq: Once | INTRAMUSCULAR | Status: AC
  Administered 2023-03-26: 50 ug via INTRAVENOUS

## 2023-03-26 MED ORDER — DEXAMETHASONE SODIUM PHOSPHATE 10 MG/ML IJ SOLN
INTRAMUSCULAR | Status: AC
  Filled 2023-03-26: qty 1

## 2023-03-26 MED ORDER — VANCOMYCIN HCL IN DEXTROSE 1-5 GM/200ML-% IV SOLN
INTRAVENOUS | Status: AC
  Filled 2023-03-26: qty 200

## 2023-03-26 MED ORDER — PHENYLEPHRINE HCL (PRESSORS) 10 MG/ML IV SOLN
INTRAVENOUS | Status: AC
  Filled 2023-03-26: qty 1

## 2023-03-26 MED ORDER — PHENYLEPHRINE 80 MCG/ML (10ML) SYRINGE FOR IV PUSH (FOR BLOOD PRESSURE SUPPORT)
PREFILLED_SYRINGE | INTRAVENOUS | Status: AC
  Filled 2023-03-26: qty 10

## 2023-03-26 MED ORDER — AMOXICILLIN 500 MG PO TABS: 500.0000 mg | ORAL_TABLET | Freq: Two times a day (BID) | ORAL | 0 refills | Status: AC

## 2023-03-26 MED ORDER — MIDAZOLAM HCL 2 MG/2ML IJ SOLN
INTRAMUSCULAR | Status: AC
  Filled 2023-03-26: qty 2

## 2023-03-26 MED ORDER — LIDOCAINE 2% (20 MG/ML) 5 ML SYRINGE
INTRAMUSCULAR | Status: AC
  Filled 2023-03-26: qty 5

## 2023-03-26 MED ORDER — CEFAZOLIN SODIUM-DEXTROSE 2-4 GM/100ML-% IV SOLN
2.0000 g | INTRAVENOUS | Status: AC
  Administered 2023-03-26: 2 g via INTRAVENOUS

## 2023-03-26 MED ORDER — FENTANYL CITRATE (PF) 100 MCG/2ML IJ SOLN
25.0000 ug | INTRAMUSCULAR | Status: DC | PRN
  Administered 2023-03-26: 25 ug via INTRAVENOUS
  Administered 2023-03-26 (×2): 50 ug via INTRAVENOUS

## 2023-03-26 MED ORDER — ROCURONIUM BROMIDE 10 MG/ML (PF) SYRINGE
PREFILLED_SYRINGE | INTRAVENOUS | Status: AC
  Filled 2023-03-26: qty 10

## 2023-03-26 MED ORDER — LACTATED RINGERS IV SOLN: INTRAVENOUS | Status: DC

## 2023-03-26 MED ORDER — ACETAMINOPHEN 500 MG PO TABS
1000.0000 mg | ORAL_TABLET | Freq: Once | ORAL | Status: AC
  Administered 2023-03-26: 1000 mg via ORAL

## 2023-03-26 MED ORDER — CEFAZOLIN SODIUM-DEXTROSE 2-4 GM/100ML-% IV SOLN
INTRAVENOUS | Status: AC
  Filled 2023-03-26: qty 100

## 2023-03-26 MED ORDER — ONDANSETRON HCL 4 MG/2ML IJ SOLN
INTRAMUSCULAR | Status: AC
  Filled 2023-03-26: qty 2

## 2023-03-26 MED ORDER — TRANEXAMIC ACID-NACL 1000-0.7 MG/100ML-% IV SOLN
1000.0000 mg | INTRAVENOUS | Status: AC
  Administered 2023-03-26: 1000 mg via INTRAVENOUS

## 2023-03-26 MED ORDER — EPINEPHRINE PF 1 MG/ML IJ SOLN
INTRAMUSCULAR | Status: AC
  Filled 2023-03-26: qty 1

## 2023-03-26 MED ORDER — GABAPENTIN 300 MG PO CAPS: 300.0000 mg | ORAL_CAPSULE | Freq: Once | ORAL | Status: DC

## 2023-03-26 MED ORDER — PHENYLEPHRINE HCL (PRESSORS) 10 MG/ML IV SOLN
INTRAVENOUS | Status: DC | PRN
  Administered 2023-03-26 (×2): 80 ug via INTRAVENOUS

## 2023-03-26 MED ORDER — VANCOMYCIN HCL 1000 MG IV SOLR
INTRAVENOUS | Status: DC | PRN
  Administered 2023-03-26: 1000 mg via TOPICAL

## 2023-03-26 MED ORDER — 0.9 % SODIUM CHLORIDE (POUR BTL) OPTIME
TOPICAL | Status: DC | PRN
  Administered 2023-03-26: 3000 mL

## 2023-03-26 MED ORDER — DEXAMETHASONE SODIUM PHOSPHATE 10 MG/ML IJ SOLN
INTRAMUSCULAR | Status: DC | PRN
  Administered 2023-03-26: 10 mg via INTRAVENOUS

## 2023-03-26 MED ORDER — OXYCODONE HCL 5 MG/5ML PO SOLN: 5.0000 mg | Freq: Once | ORAL | Status: DC | PRN

## 2023-03-26 MED ORDER — ROCURONIUM BROMIDE 100 MG/10ML IV SOLN
INTRAVENOUS | Status: DC | PRN
  Administered 2023-03-26: 50 mg via INTRAVENOUS

## 2023-03-26 MED ORDER — AMISULPRIDE (ANTIEMETIC) 5 MG/2ML IV SOLN: 10.0000 mg | Freq: Once | INTRAVENOUS | Status: DC | PRN

## 2023-03-26 SURGICAL SUPPLY — 72 items
AID PSTN UNV HD RSTRNT DISP (MISCELLANEOUS) ×1
APL PRP STRL LF DISP 70% ISPRP (MISCELLANEOUS) ×1
BASEPLATE SHOULDER FW 15D 29 (Joint) IMPLANT
BIT DRILL 3.2 PERIPHERAL SCREW (BIT) IMPLANT
BLADE SAW SGTL 73X25 THK (BLADE) ×1 IMPLANT
BLADE SURG 10 STRL SS (BLADE) IMPLANT
BLADE SURG 15 STRL LF DISP TIS (BLADE) IMPLANT
BLADE SURG 15 STRL SS (BLADE)
BRUSH SCRUB EZ PLAIN DRY (MISCELLANEOUS) ×1 IMPLANT
BSPLAT GLND 15D 29 FULL WDG (Joint) ×1 IMPLANT
CHLORAPREP W/TINT 26 (MISCELLANEOUS) ×1 IMPLANT
CLSR STERI-STRIP ANTIMIC 1/2X4 (GAUZE/BANDAGES/DRESSINGS) ×1 IMPLANT
COOLER ICEMAN CLASSIC (MISCELLANEOUS) ×1 IMPLANT
COVER BACK TABLE 60X90IN (DRAPES) ×1 IMPLANT
COVER MAYO STAND STRL (DRAPES) ×1 IMPLANT
DRAPE IMP U-DRAPE 54X76 (DRAPES) IMPLANT
DRAPE INCISE IOBAN 66X45 STRL (DRAPES) ×1 IMPLANT
DRAPE POUCH INSTRU U-SHP 10X18 (DRAPES) ×1 IMPLANT
DRAPE U-SHAPE 76X120 STRL (DRAPES) ×2 IMPLANT
DRSG AQUACEL AG ADV 3.5X 6 (GAUZE/BANDAGES/DRESSINGS) ×1 IMPLANT
ELECT BLADE 4.0 EZ CLEAN MEGAD (MISCELLANEOUS) ×1
ELECT REM PT RETURN 9FT ADLT (ELECTROSURGICAL) ×1
ELECTRODE BLDE 4.0 EZ CLN MEGD (MISCELLANEOUS) ×1 IMPLANT
ELECTRODE REM PT RTRN 9FT ADLT (ELECTROSURGICAL) ×1 IMPLANT
FACESHIELD WRAPAROUND (MASK) ×2 IMPLANT
FACESHIELD WRAPAROUND OR TEAM (MASK) ×2 IMPLANT
GAUZE XEROFORM 1X8 LF (GAUZE/BANDAGES/DRESSINGS) IMPLANT
GLENOSPHERE STANDARD 39 (Joint) ×1 IMPLANT
GLENOSPHERE STD 39 (Joint) IMPLANT
GLOVE BIO SURGEON STRL SZ 6.5 (GLOVE) ×2 IMPLANT
GLOVE BIOGEL PI IND STRL 6.5 (GLOVE) ×1 IMPLANT
GLOVE BIOGEL PI IND STRL 8 (GLOVE) ×1 IMPLANT
GLOVE ECLIPSE 8.0 STRL XLNG CF (GLOVE) ×2 IMPLANT
GOWN STRL REUS W/ TWL LRG LVL3 (GOWN DISPOSABLE) ×2 IMPLANT
GOWN STRL REUS W/TWL LRG LVL3 (GOWN DISPOSABLE) ×2
GOWN STRL REUS W/TWL XL LVL3 (GOWN DISPOSABLE) ×1 IMPLANT
GUIDE PIN 3X75 SHOULDER (PIN) ×1
GUIDEWIRE GLENOID 2.5X220 (WIRE) IMPLANT
HANDPIECE INTERPULSE COAX TIP (DISPOSABLE) ×1
INSERT HUM THK+3X.75 39XSTRL (Joint) IMPLANT
INSERT REVERSED THICKNESS +3 (Joint) ×1 IMPLANT
INSRT HUM THK+3X.75 39XSTRL (Joint) ×1 IMPLANT
KIT SHOULDER STAB MARCO (KITS) ×1 IMPLANT
MANIFOLD NEPTUNE II (INSTRUMENTS) ×1 IMPLANT
PACK BASIN DAY SURGERY FS (CUSTOM PROCEDURE TRAY) ×1 IMPLANT
PACK SHOULDER (CUSTOM PROCEDURE TRAY) ×1 IMPLANT
PAD COLD SHLDR WRAP-ON (PAD) ×1 IMPLANT
PIN GUIDE 3X75 SHOULDER (PIN) IMPLANT
RESTRAINT HEAD UNIVERSAL NS (MISCELLANEOUS) ×1 IMPLANT
SCREW 5.5X22 (Screw) IMPLANT
SCREW 5.5X26 (Screw) IMPLANT
SCREW BONE THREAD 6.5X35 (Screw) IMPLANT
SCREW PERIPHERAL 30 (Screw) IMPLANT
SCREW PERIPHERAL 5.0X34 (Screw) IMPLANT
SET HNDPC FAN SPRY TIP SCT (DISPOSABLE) ×1 IMPLANT
SHEET MEDIUM DRAPE 40X70 STRL (DRAPES) ×1 IMPLANT
SLEEVE SCD COMPRESS KNEE MED (STOCKING) ×1 IMPLANT
SPIKE FLUID TRANSFER (MISCELLANEOUS) IMPLANT
SPONGE T-LAP 18X18 ~~LOC~~+RFID (SPONGE) ×1 IMPLANT
STEM HUMERAL PLUS LONG SZ3 (Orthopedic Implant) IMPLANT
SUT ETHIBOND 2 V 37 (SUTURE) ×1 IMPLANT
SUT ETHIBOND NAB CT1 #1 30IN (SUTURE) ×1 IMPLANT
SUT ETHILON 3 0 PS 1 (SUTURE) IMPLANT
SUT FIBERWIRE #5 38 CONV NDL (SUTURE) ×2
SUT MNCRL AB 4-0 PS2 18 (SUTURE) ×1 IMPLANT
SUT VIC AB 0 CT1 27 (SUTURE)
SUT VIC AB 0 CT1 27XBRD ANBCTR (SUTURE) IMPLANT
SUT VIC AB 3-0 SH 27 (SUTURE) ×1
SUT VIC AB 3-0 SH 27X BRD (SUTURE) ×1 IMPLANT
SUTURE FIBERWR #5 38 CONV NDL (SUTURE) ×2 IMPLANT
TOWEL GREEN STERILE FF (TOWEL DISPOSABLE) ×3 IMPLANT
TUBE SUCTION HIGH CAP CLEAR NV (SUCTIONS) ×1 IMPLANT

## 2023-03-26 NOTE — Anesthesia Procedure Notes (Signed)
Procedure Name: Intubation Date/Time: 03/26/2023 8:42 AM  Performed by: Thornell Mule, CRNAPre-anesthesia Checklist: Patient identified, Emergency Drugs available, Suction available and Patient being monitored Patient Re-evaluated:Patient Re-evaluated prior to induction Oxygen Delivery Method: Circle system utilized Preoxygenation: Pre-oxygenation with 100% oxygen Induction Type: IV induction Ventilation: Mask ventilation without difficulty Laryngoscope Size: Miller and 3 Grade View: Grade I Tube type: Oral Tube size: 7.5 mm Number of attempts: 1 Airway Equipment and Method: Stylet and Oral airway Placement Confirmation: ETT inserted through vocal cords under direct vision, positive ETCO2 and breath sounds checked- equal and bilateral Secured at: 22 cm Tube secured with: Tape Dental Injury: Teeth and Oropharynx as per pre-operative assessment

## 2023-03-26 NOTE — Progress Notes (Signed)
Assisted Dr. Neoma Laming with right, interscalene , ultrasound guided block. Side rails up, monitors on throughout procedure. See vital signs in flow sheet. Tolerated Procedure well.

## 2023-03-26 NOTE — Transfer of Care (Signed)
Immediate Anesthesia Transfer of Care Note  Patient: Larry Bautista  Procedure(s) Performed: REVERSE SHOULDER ARTHROPLASTY (Right: Shoulder)  Patient Location: PACU  Anesthesia Type:GA combined with regional for post-op pain  Level of Consciousness: drowsy, patient cooperative, and responds to stimulation  Airway & Oxygen Therapy: Patient Spontanous Breathing and Patient connected to face mask oxygen  Post-op Assessment: Report given to RN and Post -op Vital signs reviewed and stable  Post vital signs: Reviewed and stable  Last Vitals:  Vitals Value Taken Time  BP    Temp    Pulse 67 03/26/23 1006  Resp 18 03/26/23 1006  SpO2 99 % 03/26/23 1006  Vitals shown include unfiled device data.  Last Pain:  Vitals:   03/26/23 0745  TempSrc: Oral  PainSc: 0-No pain         Complications: No notable events documented.

## 2023-03-26 NOTE — Anesthesia Procedure Notes (Signed)
Anesthesia Regional Block: Interscalene brachial plexus block   Pre-Anesthetic Checklist: , timeout performed,  Correct Patient, Correct Site, Correct Laterality,  Correct Procedure, Correct Position, site marked,  Risks and benefits discussed,  Surgical consent,  Pre-op evaluation,  At surgeon's request and post-op pain management  Laterality: Right  Prep: chloraprep       Needles:  Injection technique: Single-shot  Needle Type: Echogenic Stimulator Needle     Needle Length: 9cm  Needle Gauge: 21     Additional Needles:   Procedures:,,,, ultrasound used (permanent image in chart),,    Narrative:  Start time: 03/26/2023 8:01 AM End time: 03/26/2023 8:05 AM Injection made incrementally with aspirations every 5 mL.  Performed by: Personally  Anesthesiologist: Linton Rump, MD  Additional Notes: Discussed risks and benefits of nerve block including, but not limited to, prolonged and/or permanent nerve injury involving sensory and/or motor function. Monitors were applied and a time-out was performed. The nerve and associated structures were visualized under ultrasound guidance. After negative aspiration, local anesthetic was slowly injected around the nerve. There was no evidence of high pressure during the procedure. There were no paresthesias. VSS remained stable and the patient tolerated the procedure well.

## 2023-03-26 NOTE — H&P (Signed)
PREOPERATIVE H&P  Chief Complaint: right shouler DJD  HPI: Larry Bautista is a 82 y.o. male who is scheduled for Procedure(s): REVERSE SHOULDER ARTHROPLASTY.   Patient is an 82 year-old who had right shoulder arthritis for some time.  He had an injection on March 8th.  He is here to discuss total shoulder arthroplasty.  He uses a cane in his left arm.  He has afib and takes Warfarin.  He has had esophageal cancer in the past.    Symptoms are rated as moderate to severe, and have been worsening.  This is significantly impairing activities of daily living.    Please see clinic note for further details on this patient's care.    He has elected for surgical management.   Past Medical History:  Diagnosis Date   Allergy    Arthritis    Blood transfusion without reported diagnosis    Cancer (HCC)    esoph-chemo and rad   CHF (congestive heart failure) (HCC)    Chronic knee pain    Coronary artery disease    with LAD DE stent 2004   Dysplastic nevus 03/26/2016   right mid med calf   Dysrhythmia    Heel spur, left    Hyperlipidemia    Hypertension    Kidney stones    Lower back pain    status post surgery for spondylolisthesis   Migraine    Persistent atrial fibrillation (HCC)    longstanding persistent (since 05/2008)   Plantar fasciitis    Past Surgical History:  Procedure Laterality Date   BACK SURGERY     spondylolisthesis   CARDIAC CATHETERIZATION  03/06/2003   CORONARY ANGIOPLASTY WITH STENT PLACEMENT  04/09/2003   CYPHER stent implantation in the LAD   ESOPHAGOGASTRODUODENOSCOPY (EGD) WITH PROPOFOL N/A 01/07/2018   Procedure: ESOPHAGOGASTRODUODENOSCOPY (EGD) WITH PROPOFOL;  Surgeon: Graylin Shiver, MD;  Location[facility] ENDOSCOPY;  Service: Endoscopy;  Laterality: N/A;   HAND SURGERY Right 09/08/2012   carpal tunnel   HERNIA REPAIR     INGUINAL HERNIA REPAIR Right 04/26/2020   Procedure: RIGHT INGUINAL HERNIA REPAIR WITH MESH;  Surgeon: Abigail Miyamoto, MD;   Location: [facility];  Service: General;  Laterality: Right;   SKIN CANCER EXCISION  05/10/1979   Face   SPINE SURGERY  2000   VASCULAR SURGERY     Vascular Stent   Social History   Socioeconomic History   Marital status: Married    Spouse name: Avett Reineck   Number of children: 1   Years of education: Not on file   Highest education level: Bachelor's degree (e.g., BA, AB, BS)  Occupational History   Occupation: retired  Tobacco Use   Smoking status: Former    Current packs/day: 0.00    Average packs/day: 2.0 packs/day for 35.2 years (70.3 ttl pk-yrs)    Types: Cigarettes    Start date: 09/08/1954    Quit date: 11/06/1989    Years since quitting: 33.4   Smokeless tobacco: Never   Tobacco comments:    Smoked from [phone] (35 years)  Vaping Use   Vaping status: Never Used  Substance and Sexual Activity   Alcohol use: Not Currently    Comment: 1961-2009 (Last drink 2009)    Drug use: No   Sexual activity: Yes    Birth control/protection: None  Other Topics Concern   Not on file  Social History Narrative   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  Married in 2015. Has one daughter, and 4 grandchildren. He lives in [place].   Social Determinants of Health   Financial Resource Strain: Low Risk  (02/15/2023)   Overall Financial Resource Strain (CARDIA)    Difficulty of Paying Living Expenses: Not hard at all  Food Insecurity: No Food Insecurity (02/15/2023)   Hunger Vital Sign    Worried About Running Out of Food in the Last Year: Never true    Ran Out of Food in the Last Year: Never true  Transportation Needs: No Transportation Needs (02/15/2023)   PRAPARE - Administrator, Civil Service (Medical): No    Lack of Transportation (Non-Medical): No  Physical Activity: Unknown (02/15/2023)   Exercise Vital Sign    Days of Exercise per Week: 0 days    Minutes of Exercise per Session: Not on file  Stress: No Stress Concern  Present (02/15/2023)   [employer] of Occupational Health - Occupational Stress Questionnaire    Feeling of Stress : Only a little  Social Connections: Moderately Integrated (02/15/2023)   Social Connection and Isolation Panel [NHANES]    Frequency of Communication with Friends and Family: Once a week    Frequency of Social Gatherings with Friends and Family: Never    Attends Religious Services: More than 4 times per year    Active Member of [employer] or Organizations: Yes    Attends Engineer, structural: More than 4 times per year    Marital Status: Married   Family History  Problem Relation Age of Onset   Lung cancer Mother    Cancer Mother    Heart attack Father    Colon cancer Father    Congestive Heart Failure Father    Cancer Father    Atrial fibrillation Brother    Stroke Brother    Bladder Cancer Brother    Cancer Brother    COPD Brother    Atrial fibrillation Brother    COPD Brother    Congestive Heart Failure Brother    Lung cancer Brother    Arthritis Brother    Cancer Brother    Migraines Daughter    Allergies  Allergen Reactions   Celecoxib Rash   Lisinopril Cough    Other Reaction(s): cough   Simvastatin Other (See Comments)    Leg pain   Prior to Admission medications   Medication Sig Start Date End Date Taking? Authorizing Provider  cetirizine (ZYRTEC) 10 MG tablet Take 1 tablet (10 mg total) by mouth daily. 01/21/23  Yes Jodelle Red, MD  eplerenone (INSPRA) 25 MG tablet Take 1 tablet (25 mg total) by mouth every Monday, Wednesday, and Friday. 01/21/23  Yes Jodelle Red, MD  gabapentin (NEURONTIN) 100 MG capsule Take 100 mg by mouth daily. 06/25/21  Yes [provider]  rosuvastatin (CRESTOR) 20 MG tablet Take 1 tablet (20 mg total) by mouth daily. 01/21/23  Yes Jodelle Red, MD  tamsulosin (FLOMAX) 0.4 MG CAPS capsule Take 0.4 mg by mouth daily after supper.   Yes [provider]  tiZANidine  (ZANAFLEX) 4 MG tablet Take 4 mg by mouth daily. 07/29/21  Yes [provider]  warfarin (COUMADIN) 2.5 MG tablet TAKE 1/2 TABLET ON SUNDAY, TUESDAY, THURSDAY, AND SATURDAY OR AS DIRECTED BY COUMADIN CLINIC 01/21/23  Yes Jodelle Red, MD  warfarin (COUMADIN) 5 MG tablet TAKE 1/2 TABLET ON MONDAY, WEDNESDAY, AND FRIDAY 01/21/23  Yes Jodelle Red, MD  acetaminophen (TYLENOL) 325 MG tablet Take 2 tablets (650 mg total)  by mouth QID. Patient taking differently: Take 325 mg by mouth at bedtime. 08/25/19   Joseph Art, DO  diclofenac Sodium (VOLTAREN) 1 % GEL Apply topically 4 (four) times daily.    [provider]  diphenhydramine-acetaminophen (TYLENOL PM) 25-500 MG TABS tablet Take 1 tablet by mouth at bedtime as needed.    [provider]  fluticasone Aleda Grana) 50 MCG/ACT nasal spray USE 2 SPRAYS IN [facility] NOSTRIL DAILY 07/02/22   Malva Limes, MD  nitroGLYCERIN (NITROSTAT) 0.4 MG SL tablet Place 1 tablet (0.4 mg total) under the tongue every 5 (five) minutes as needed. 06/06/19   Lyn Records, MD  psyllium (METAMUCIL) 58.6 % powder Take 1 packet by mouth daily.     [provider]  vitamin C (ASCORBIC ACID) 500 MG tablet Take 500 mg by mouth daily.    [provider]    ROS: All other systems have been reviewed and were otherwise negative with the exception of those mentioned in the HPI and as above.  Physical Exam: General: Alert, no acute distress Cardiovascular: No pedal edema Respiratory: No cyanosis, no use of accessory musculature GI: No organomegaly, abdomen is soft and non-tender Skin: No lesions in the area of chief complaint Neurologic: Sensation intact distally Psychiatric: Patient is competent for consent with normal mood and affect Lymphatic: No axillary or cervical lymphadenopathy  MUSCULOSKELETAL:  Active forward elevation to 80.  External rotation to 20.  Internal rotation to back pocket.     Imaging: X-rays demonstrate end stage bone on bone arthritis of the glenohumeral joint with proximal migration.    BMI: Estimated body mass index is 28.04 kg/m as calculated from the following:   Height as of this encounter: 5\' 7"  (1.702 m).   Weight as of this encounter: 81.2 kg.  Lab Results  Component Value Date   ALBUMIN 4.4 11/26/2022   Diabetes: Patient does not have a diagnosis of diabetes.     Smoking Status:       Assessment: right shouler DJD  Plan: Plan for Procedure(s): REVERSE SHOULDER ARTHROPLASTY  The risks benefits and alternatives were discussed with the patient including but not limited to the risks of nonoperative treatment, versus surgical intervention including infection, bleeding, nerve injury,  blood clots, cardiopulmonary complications, morbidity, mortality, among others, and they were willing to proceed.   We additionally specifically discussed risks of axillary nerve injury, infection, periprosthetic fracture, continued pain and longevity of implants prior to beginning procedure.    Patient will be closely monitored in PACU for medical stabilization and pain control. If found stable in PACU, patient may be discharged home with outpatient follow-up. If any concerns regarding patient's stabilization patient will be admitted for observation after surgery. The patient is planning to be discharged home with outpatient PT.   The patient acknowledged the explanation, agreed to proceed with the plan and consent was signed.   He received operative clearance from his PCP, Dr. Ruthine Dose, and his cardiologist, Dr. Hughie Closs.   Operative Plan: Right reverse total shoulder arthroplasty Discharge Medications: standard DVT Prophylaxis: resume Warfarin Physical Therapy: outpatient PT Special Discharge needs: Sling. IceMan   Vernetta Honey, PA-C  03/26/2023 6:30 AM

## 2023-03-26 NOTE — Discharge Instructions (Addendum)
Ramond Marrow MD, MPH Alfonse Alpers, PA-C [facility] [address], [address] [phone] (tel)   [phone] (fax)   POST-OPERATIVE INSTRUCTIONS - TOTAL SHOULDER REPLACEMENT    WOUND CARE You may leave the operative dressing in place until your follow-up appointment. KEEP THE INCISIONS CLEAN AND DRY. There may be a small amount of fluid/bleeding leaking at the surgical site. This is normal after surgery.  If it fills with liquid or blood please call us immediately to change it for you. Use the provided ice machine or Ice packs as often as possible for the first 3-4 days, then as needed for pain relief.   Keep a layer of cloth or a shirt between your skin and the cooling unit to prevent frost bite as it can get very cold.  SHOWERING: - You may shower on Post-Op Day #2.  - The dressing is water resistant but do not scrub it as it may start to peel up.   - You may remove the sling for showering - Gently pat the area dry.  - Do not soak the shoulder in water.  - Do not go swimming in the pool or ocean until your incision has completely healed (about 4-6 weeks after surgery) - KEEP THE INCISIONS CLEAN AND DRY.  EXERCISES Wear the sling at all times  You may remove the sling for showering, but keep the arm across the chest or in a secondary sling.    Accidental/Purposeful External Rotation and shoulder flexion (reaching behind you) is to be avoided at all costs for the first month. It is ok to come out of your sling if your are sitting and have assistance for eating.   Do not lift anything heavier than 1 pound until we discuss it further in clinic.  It is normal for your fingers/hand to become more swollen after surgery and discolored from bruising.   This will resolve over the first few weeks usually after surgery. Please continue to ambulate and do not stay sitting or lying for too long.  Perform foot and wrist pumps to assist in circulation.  PHYSICAL  THERAPY - You will begin physical therapy soon after surgery (unless otherwise specified) - Please call to set up an appointment, if you do not already have one  - Let our office if there are any issues with scheduling your therapy   - A PT referral was sent to [facility] in [facility]   REGIONAL ANESTHESIA (NERVE BLOCKS) The anesthesia team may have performed a nerve block for you this is a great tool used to minimize pain.   The block may start wearing off overnight (between 8-24 hours postop) When the block wears off, your pain may go from nearly zero to the pain you would have had postop without the block. This is an abrupt transition but nothing dangerous is happening.   This can be a challenging period but utilize your as needed pain medications to try and manage this period. We suggest you use the pain medication the first night prior to going to bed, to ease this transition.  You may take an extra dose of narcotic when this happens if needed   POST-OP MEDICATIONS- Multimodal approach to pain control In general your pain will be controlled with a combination of substances.  Prescriptions unless otherwise discussed are electronically sent to your pharmacy.  This is a carefully made plan we use to minimize narcotic use.     Acetaminophen - Non-narcotic pain medicine  taken on a scheduled basis  Oxycodone - This is a strong narcotic, to be used only on an "as needed" basis for SEVERE pain. Zofran -  take as needed for nausea  Medications were sent to [facility] in [place] on 7/17  Amoxicillin - this is an antibiotic, take as instructed  Prescription sent to [facility] in [place] on 7/18  FOLLOW-UP If you develop a Fever (>101.5), Redness or Drainage from the surgical incision site, please call our office to arrange for an evaluation. Please call the office to schedule a follow-up appointment for a wound check, 7-10 days post-operatively.  IF YOU HAVE ANY  QUESTIONS, PLEASE FEEL FREE TO CALL OUR OFFICE.  HELPFUL INFORMATION  Your arm will be in a sling following surgery. You will be in this sling for the next 4 weeks.   You may be more comfortable sleeping in a semi-seated position the first few nights following surgery.  Keep a pillow propped under the elbow and forearm for comfort.  If you have a recliner type of chair it might be beneficial.  If not that is fine too, but it would be helpful to sleep propped up with pillows behind your operated shoulder as well under your elbow and forearm.  This will reduce pulling on the suture lines.  When dressing, put your operative arm in the sleeve first.  When getting undressed, take your operative arm out last.  Loose fitting, button-down shirts are recommended.  In most states it is against the law to drive while your arm is in a sling. And certainly against the law to drive while taking narcotics.  You may return to work/school in the next couple of days when you feel up to it. Desk work and typing in the sling is fine.  We suggest you use the pain medication the first night prior to going to bed, in order to ease any pain when the anesthesia wears off. You should avoid taking pain medications on an empty stomach as it will make you nauseous.  You should wean off your narcotic medicines as soon as you are able.     Most patients will be off narcotics before their first postop appointment.   Do not drink alcoholic beverages or take illicit drugs when taking pain medications.  Pain medication may make you constipated.  Below are a few solutions to try in this order: Decrease the amount of pain medication if you aren't having pain. Drink lots of decaffeinated fluids. Drink prune juice and/or each dried prunes  If the first 3 don't work start with additional solutions Take Colace - an over-the-counter stool softener Take Senokot - an over-the-counter laxative Take Miralax - a stronger  over-the-counter laxative   Dental Antibiotics:  In most cases prophylactic antibiotics for Dental procdeures after total joint surgery are not necessary.  Exceptions are as follows:  1. History of prior total joint infection  2. Severely immunocompromised (Organ Transplant, cancer chemotherapy, Rheumatoid biologic meds such as Humera)  3. Poorly controlled diabetes (A1C &gt; 8.0, blood glucose over 200)  If you have one of these conditions, contact your surgeon for an antibiotic prescription, prior to your dental procedure.   For more information including helpful videos and documents visit our website:   [email or web address]       Post Anesthesia Home Care Instructions  Activity: Get plenty of rest for the remainder of the day. A responsible individual must stay with you for 24 hours following the procedure.  For the next 24 hours, DO NOT: -Drive a car -Advertising copywriter -Drink alcoholic beverages -Take any medication unless instructed by your physician -Make any legal decisions or sign important papers.  Meals: Start with liquid foods such as gelatin or soup. Progress to regular foods as tolerated. Avoid greasy, spicy, heavy foods. If nausea and/or vomiting occur, drink only clear liquids until the nausea and/or vomiting subsides. Call your physician if vomiting continues.  Special Instructions/Symptoms: Your throat may feel dry or sore from the anesthesia or the breathing tube placed in your throat during surgery. If this causes discomfort, gargle with warm salt water. The discomfort should disappear within 24 hours.  If you had a scopolamine patch placed behind your ear for the management of post- operative nausea and/or vomiting:  1. The medication in the patch is effective for 72 hours, after which it should be removed.  Wrap patch in a tissue and discard in the trash. Wash hands thoroughly with soap and water. 2. You may remove  the patch earlier than 72 hours if you experience unpleasant side effects which may include dry mouth, dizziness or visual disturbances. 3. Avoid touching the patch. Wash your hands with soap and water after contact with the patch.      Regional Anesthesia Blocks  1. Numbness or the inability to move the "blocked" extremity may last from 3-48 hours after placement. The length of time depends on the medication injected and your individual response to the medication. If the numbness is not going away after 48 hours, call your surgeon.  2. The extremity that is blocked will need to be protected until the numbness is gone and the  Strength has returned. Because you cannot feel it, you will need to take extra care to avoid injury. Because it may be weak, you may have difficulty moving it or using it. You may not know what position it is in without looking at it while the block is in effect.  3. For blocks in the legs and feet, returning to weight bearing and walking needs to be done carefully. You will need to wait until the numbness is entirely gone and the strength has returned. You should be able to move your leg and foot normally before you try and bear weight or walk. You will need someone to be with you when you first try to ensure you do not fall and possibly risk injury.  4. Bruising and tenderness at the needle site are common side effects and will resolve in a few days.  5. Persistent numbness or new problems with movement should be communicated to the surgeon or the [facility] [phone] [facility] [phone]).   Information for Discharge Teaching: EXPAREL (bupivacaine liposome injectable suspension)   Your surgeon or anesthesiologist gave you EXPAREL(bupivacaine) to help control your pain after surgery.  EXPAREL is a local anesthetic that provides pain relief by numbing the tissue around the surgical site. EXPAREL is designed to release pain medication  over time and can control pain for up to 72 hours. Depending on how you respond to EXPAREL, you may require less pain medication during your recovery.  Possible side effects: Temporary loss of sensation or ability to move in the area where bupivacaine was injected. Nausea, vomiting, constipation Rarely, numbness and tingling in your mouth or lips, lightheadedness, or anxiety may occur. Call your doctor right away if you think you may be experiencing any of these sensations, or if you have other questions  regarding possible side effects.  Follow all other discharge instructions given to you by your surgeon or nurse. Eat a healthy diet and drink plenty of water or other fluids.  If you return to the hospital for any reason within 96 hours following the administration of EXPAREL, it is important for health care providers to know that you have received this anesthetic. A teal colored band has been placed on your arm with the date, time and amount of EXPAREL you have received in order to alert and inform your health care providers. Please leave this armband in place for the full 96 hours following administration, and then you may remove the band.   Next dose of Tylenol may be given at 2pm if needed.

## 2023-03-26 NOTE — Op Note (Signed)
Orthopaedic Surgery Operative Note (CSN: [id number])  Larry Bautista  1941/03/10 Date of Surgery: 03/26/2023   Diagnoses:  Right primary glenohumeral arthritis with cuff thinning and possible infection  Procedure: Right reverse total Shoulder Arthroplasty   Operative Finding Successful completion of planned procedure.  Upon opening the joint the patient had significant white material that look like synovitis.  This did not appear to be purulence.  It was adherent on the underside of the subscapularis and the capsule both anteriorly and posteriorly.  It did not appear consistent with injection reaction type material.  Based on its appearance I felt it was appropriate for culture to rule out infection.  It appeared atypical enough that this was necessary.  The instrumentation of the shoulder was relatively routine though the humeral bone quality was poor.  We will follow standard postoperative protocol but put the patient on suppressive antibiotics while we await cultures.  Post-operative plan: The patient will be NWB in sling.  The patient will be will be discharged from PACU if continues to be stable as was plan prior to surgery.  DVT prophylaxis Aspirin 81 mg twice daily for 6 weeks.  Pain control with PRN pain medication preferring oral medicines.  Follow up plan will be scheduled in approximately 7 days for incision check and XR.  Physical therapy to start immediately.  Implants: Tornier perform humeral size 3 long stem, +3 polyethylene, 39 standard glenosphere with a 29 full wedge baseplate, 30 center screw and 4 peripheral screws  Post-Op Diagnosis: Same Surgeons:Primary: Bjorn Pippin, MD Assistants:Caroline McBane PA-C Location: [facility] OR ROOM 6 Anesthesia: General with Exparel Interscalene Antibiotics: Ancef 2g preop, Vancomycin 1000mg  locally Tourniquet time: None Estimated Blood Loss: 100 Complications: None Specimens: 2 for culture, hold for 2 weeks to rule out C  acnes Implants: Implant Name Type Inv. Item Serial No. Manufacturer Lot No. LRB No. Used Action  BSPLAT GLND 15D 29 FULL WDG - [id number] Joint BSPLAT GLND 15D 29 FULL WDG [id number] [employer]  Right 1 Implanted  GLENOSPHERE STANDARD 39 - [id number] Joint GLENOSPHERE STANDARD 39 [id number] [employer]  Right 1 Implanted  SCREW BONE THREAD 6.5X35 - [id number] Screw SCREW BONE THREAD 6.5X35  [employer]  Right 1 Implanted  SCREW PERIPHERAL 30 - [id number] Screw SCREW PERIPHERAL 30  [employer]  Right 1 Implanted  SCREW PERIPHERAL 5.0X34 - [id number] Screw SCREW PERIPHERAL 5.0X34  [employer]  Right 1 Implanted  SCREW 5.5X26 - [id number] Screw SCREW 5.5X26  [employer]  Right 1 Implanted  SCREW 5.5X22 - [id number] Screw SCREW 5.5X22  [employer]  Right 1 Implanted  STEM HUMERAL PLUS LONG SZ3 - [id number] Orthopedic Implant STEM HUMERAL PLUS LONG SZ3 [id number] [employer]  Right 1 Implanted  INSERT REVERSED THICKNESS +3 - [id number] Joint INSERT REVERSED THICKNESS +3 [id number] [employer]  Right 1 Implanted    Indications for Surgery:   Larry Bautista is a 82 y.o. male with end-stage arthritis.  Benefits and risks of operative and nonoperative management were discussed prior to surgery with patient/guardian(s) and informed consent form was completed.  Infection and need for further surgery were discussed as was prosthetic stability and cuff issues.  We additionally specifically discussed risks of axillary nerve injury, infection, periprosthetic fracture, continued pain and longevity of implants prior to beginning procedure.      Procedure:   The patient was identified in the preoperative holding area where the surgical site was marked. Block placed by anesthesia with exparel.  The patient  was taken to the OR where a procedural timeout was called and the above noted anesthesia was induced.  The patient was positioned beachchair on allen table with spider arm positioner.  Preoperative antibiotics were  dosed.  The patient's right shoulder was prepped and draped in the usual sterile fashion.  A second preoperative timeout was called.       Standard deltopectoral approach was performed with a #10 blade. We dissected down to the subcutaneous tissues and the cephalic vein was taken laterally with the deltoid. Clavipectoral fascia was incised in line with the incision. Deep retractors were placed. The long of the biceps tendon was identified and there was significant tenosynovitis present.  Tenodesis was performed to the pectoralis tendon with #2 Ethibond. The remaining biceps was followed up into the rotator interval where it was released.   The subscapularis was taken down in a full thickness layer with capsule along the humeral neck extending inferiorly around the humeral head. We continued releasing the capsule directly off of the osteophytes inferiorly all the way around the corner. This allowed [place] to dislocate the humerl head.   The humeral head had evidence of severe osteoarthritic wear with full-thickness cartilage loss and exposed subchondral bone. There was significant flattening of the humeral head.   Due to the patient's age we felt that a reverse was more in his best interest as the superior cuff was somewhat thin as well.  Upon examination the joint there was significant synovitis and reactive changes in the synovium concerning for the possibility of infection.  Performed a complete synovectomy to multiple specimens for biopsy.  There were osteophytes along the inferior humeral neck. The osteophytes were removed with an osteotome and a rongeur.  Osteophytes were removed with a rongeur and an osteotome and the anatomic neck was well visualized.     A humeral cutting guide was used extra medullary with a pin to help control version. The version was set at 20 of retroversion. Humeral osteotomy was performed with an oscillating saw. The head fragment was passed off the back table.  A cut  protector plate was placed.  The subscapularis was again identified and immediately we took care to palpate the axillary nerve anteriorly and verify its position with gentle palpation as well as the tug test.  We then released the SGHL with bovie cautery prior to placing a curved mayo at the junction of the anterior glenoid well above the axillary nerve and bluntly dissecting the subscapularis from the capsule.  We then carefully protected the axillary nerve as we gently released the inferior capsule to fully mobilize the subscapularis.  An anterior deltoid retractor was then placed as well as a small Hohmann retractor superiorly.   The glenoid was inspected and had evidence of severe osteoarthritic wear with full-thickness cartilage loss and exposed subchondral bone.    The remaining labrum was removed circumferentially taking great care not to disrupt the posterior capsule.   At this point we felt based on blueprint templating that a full wedge augment was necessary.  We began by using a full wedge guide to place our center pin as was templated.  We had good position of this pin and we proceeded with our starter center drill.  This allowed for [place] to use the 15 degree full wedge reamer obtaining circumferential witness marks and good bone preparation for ingrowth.  At this point we proceeded with our center drill and had an intact vault.  We then drilled our center screw  to a length of 30 mm.    We selected a 6. mm x 30 mm screw and the full wedge baseplate which was placed in the same orientation as our reaming.  We double checked that we had good apposition of the base plate to bone and then proceeded to place 3 locking screws and one nonlocking screw as is typical.  Next a 39 mm glenosphere was selected and impacted onto the baseplate. The center screw was tightened.  We turned attention back to the humeral side. The cut protector was removed.  We used the perform humeral sizing block to select the  appropriate size which for this patient was a 2.  We then placed our center pin and reamed over it concentrically obtaining appropriate inset.  We then used our lateralizing chisel to prepare the lateral aspect of the humerus.  At that point we selected the appropriate implant trialing a 3.  Using this trial implant we trialed multiple polyethylene sizes settling on a 3 long which provided good stability and range of motion without excess soft tissue tension. The offset was dialed in to match the normal anatomy. The shoulder was trialed.  There was good ROM in all planes and the shoulder was stable with no inferior translation.  The real humeral implants were opened after again confirming sizes.  The trial was removed. #5 Fiberwire x4 sutures passed through the humeral neck for subscap repair. The humeral component was press-fit obtaining a secure fit. The joint was reduced and thoroughly irrigated with pulsatile lavage. Subscap was repaired back with #5 Fiberwire sutures through bone tunnels. Hemostasis was obtained. The deltopectoral interval was reapproximated with #1 Ethibond. The subcutaneous tissues were closed with 2-0 Vicryl and the skin was closed with running nylon.    The wounds were cleaned and dried and an Aquacel dressing was placed. The drapes taken down. The arm was placed into sling with abduction pillow. Patient was awakened, extubated, and transferred to the recovery room in stable condition. There were no intraoperative complications. The sponge, needle, and attention counts were  correct at the end of the case.     Alfonse Alpers, PA-C, present and scrubbed throughout the case, critical for completion in a timely fashion, and for retraction, instrumentation, closure.

## 2023-03-26 NOTE — Interval H&P Note (Signed)
All questions answered

## 2023-03-26 NOTE — Anesthesia Postprocedure Evaluation (Signed)
Anesthesia Post Note  Patient: Larry Bautista  Procedure(s) Performed: REVERSE SHOULDER ARTHROPLASTY (Right: Shoulder)     Patient location during evaluation: PACU Anesthesia Type: General Level of consciousness: awake Pain management: pain level controlled Vital Signs Assessment: post-procedure vital signs reviewed and stable Respiratory status: spontaneous breathing, nonlabored ventilation and respiratory function stable Cardiovascular status: blood pressure returned to baseline and stable Postop Assessment: no apparent nausea or vomiting Anesthetic complications: no   No notable events documented.  Last Vitals:  Vitals:   03/26/23 1049 03/26/23 1100  BP: (!) 152/84 (!) 156/80  Pulse: 71 69  Resp: 17 16  Temp:  (!) 36.2 C  SpO2: 92% 93%    Last Pain:  Vitals:   03/26/23 1100  TempSrc:   PainSc: 3                  Linton Rump

## 2023-03-27 LAB — AEROBIC/ANAEROBIC CULTURE W GRAM STAIN (SURGICAL/DEEP WOUND)
Culture: NO GROWTH
Culture: NO GROWTH

## 2023-03-28 LAB — AEROBIC/ANAEROBIC CULTURE W GRAM STAIN (SURGICAL/DEEP WOUND)

## 2023-03-30 ENCOUNTER — Encounter (HOSPITAL_BASED_OUTPATIENT_CLINIC_OR_DEPARTMENT_OTHER): Payer: Self-pay | Admitting: Orthopaedic Surgery

## 2023-03-30 ENCOUNTER — Other Ambulatory Visit: Payer: Self-pay

## 2023-03-30 ENCOUNTER — Ambulatory Visit: Payer: Medicare Other | Attending: Orthopaedic Surgery

## 2023-03-30 DIAGNOSIS — M25511 Pain in right shoulder: Secondary | ICD-10-CM | POA: Insufficient documentation

## 2023-03-30 DIAGNOSIS — M25611 Stiffness of right shoulder, not elsewhere classified: Secondary | ICD-10-CM | POA: Insufficient documentation

## 2023-03-30 LAB — AEROBIC/ANAEROBIC CULTURE W GRAM STAIN (SURGICAL/DEEP WOUND)

## 2023-03-30 NOTE — Therapy (Signed)
OUTPATIENT PHYSICAL THERAPY SHOULDER EVALUATION   Patient Name: Larry Bautista MRN: [id number] DOB:[birth year], 82 y.o., male Today's Date: 03/30/2023  END OF SESSION:  PT End of Session - 03/30/23 1346     Visit Number 1    Number of Visits 12    Date for PT Re-Evaluation 06/05/23    PT Start Time 1347    PT Stop Time 1435    PT Time Calculation (min) 48 min    Activity Tolerance Patient tolerated treatment well    Behavior During Therapy [facility] for tasks assessed/performed             Past Medical History:  Diagnosis Date   Allergy    Arthritis    Blood transfusion without reported diagnosis    Cancer (HCC)    esoph-chemo and rad   CHF (congestive heart failure) (HCC)    Chronic knee pain    Coronary artery disease    with LAD DE stent 2004   Dysplastic nevus 03/26/2016   right mid med calf   Dysrhythmia    Heel spur, left    Hyperlipidemia    Hypertension    Kidney stones    Lower back pain    status post surgery for spondylolisthesis   Migraine    Persistent atrial fibrillation (HCC)    longstanding persistent (since 05/2008)   Plantar fasciitis    Past Surgical History:  Procedure Laterality Date   BACK SURGERY     spondylolisthesis   CARDIAC CATHETERIZATION  03/06/2003   CORONARY ANGIOPLASTY WITH STENT PLACEMENT  04/09/2003   CYPHER stent implantation in the LAD   ESOPHAGOGASTRODUODENOSCOPY (EGD) WITH PROPOFOL N/A 01/07/2018   Procedure: ESOPHAGOGASTRODUODENOSCOPY (EGD) WITH PROPOFOL;  Surgeon: Graylin Shiver, MD;  Location: [facility] ENDOSCOPY;  Service: Endoscopy;  Laterality: N/A;   HAND SURGERY Right 09/08/2012   carpal tunnel   HERNIA REPAIR     INGUINAL HERNIA REPAIR Right 04/26/2020   Procedure: RIGHT INGUINAL HERNIA REPAIR WITH MESH;  Surgeon: Abigail Miyamoto, MD;  Location: [facility];  Service: General;  Laterality: Right;   REVERSE SHOULDER ARTHROPLASTY Right 03/26/2023   Procedure: REVERSE SHOULDER ARTHROPLASTY;  Surgeon:  Bjorn Pippin, MD;  Location: [facility];  Service: Orthopedics;  Laterality: Right;   SKIN CANCER EXCISION  05/10/1979   Face   SPINE SURGERY  2000   VASCULAR SURGERY     Vascular Stent   Patient Active Problem List   Diagnosis Date Noted   Gait disorder 01/14/2023   Hematoma of left knee region 08/22/2019   Knee pain 08/21/2019   Osteoarthritis of right shoulder 10/15/2018   Pulmonary nodule 09/30/2018   Adenocarcinoma of gastroesophageal junction (HCC) 01/14/2018   MRSA nasal colonization 01/12/2018   Acute GI bleeding 01/07/2018   Acute blood loss anemia 01/07/2018   COPD with chronic bronchitis 12/08/2017   Dyspnea 10/01/2016   Abnormal PFTs 10/01/2016   Hyperglycemia 03/28/2015   Allergic rhinitis 01/24/2015   Arthritis 01/24/2015   Basal cell carcinoma-1990 01/24/2015   BPH (benign prostatic hyperplasia) 01/24/2015   BMI 29.0-29.9,adult 01/24/2015   H/O renal calculi 01/24/2015   Headache, migraine 01/24/2015   AF (paroxysmal atrial fibrillation) (HCC) 01/24/2015   Spinal stenosis 01/24/2015   SPL (spondylolisthesis) 01/24/2015   Benign paroxysmal positional nystagmus 01/24/2015   Encounter for therapeutic drug monitoring 11/02/2013   Hyperlipidemia 05/03/2009   MIGRAINE HEADACHE 05/03/2009   Essential hypertension 05/03/2009   CAD in native artery 05/03/2009   KNEE PAIN  05/03/2009   Backache 05/03/2009   History of tobacco use 05/03/2009    PCP: Jeani Sow, MD  REFERRING PROVIDER: Bjorn Pippin, MD   REFERRING DIAG: S/P Right Reverse Total Shoulder Replacement-DOS 03/26/23   THERAPY DIAG:  Stiffness of right shoulder, not elsewhere classified  Acute pain of right shoulder  Rationale for Evaluation and Treatment: Rehabilitation  ONSET DATE: 03/26/23  SUBJECTIVE:                                                                                                                                                                                       SUBJECTIVE STATEMENT: Patient reports that he had a right reverse total shoulder replacement on 03/26/23. He has been having pain in his right shoulder and bicep since surgery. He has been wearing his sling at all times except when he has a shower.  Hand dominance: Left  PERTINENT HISTORY: Hypertension, atrial fibrillation, COPD, arthritis, allergies, history of cancer, and congestive heart failure  PAIN:  Are you having pain? Yes: NPRS scale: 3-4/10 Pain location: right shoulder and bicep Pain description: constant sore and ache Aggravating factors: none known Relieving factors: ice and medication  PRECAUTIONS: Shoulder  RED FLAGS: None   WEIGHT BEARING RESTRICTIONS: No  FALLS:  Has patient fallen in last 6 months?  No, but he has had balance problems since he had chemotherapy approximately 3 years ago  LIVING ENVIRONMENT: Lives with: lives with their spouse Lives in: House/apartment Has following equipment at home: Single point cane  OCCUPATION: Retired  PLOF: Independent  PATIENT GOALS:improved use of his right shoulder (driving, getting dressed)  NEXT MD VISIT: 04/03/23  OBJECTIVE:   PATIENT SURVEYS:  FOTO 11.54  COGNITION: Overall cognitive status: Within functional limits for tasks assessed     SENSATION: Patient reports that he has some numbness in his left hand from chemotherapy.   UPPER EXTREMITY ROM:   Active ROM Right eval Left eval  Shoulder flexion 30 115  Shoulder extension    Shoulder abduction 30 102  Shoulder adduction    Shoulder internal rotation  To L3  Shoulder external rotation -15 To T2  Elbow flexion    Elbow extension    Wrist flexion    Wrist extension    Wrist ulnar deviation    Wrist radial deviation    Wrist pronation    Wrist supination    (Blank rows = not tested)  UPPER EXTREMITY MMT:  MMT Right eval Left eval  Shoulder flexion    Shoulder extension    Shoulder abduction    Shoulder adduction     Shoulder internal rotation    Shoulder external  rotation    Middle trapezius    Lower trapezius    Elbow flexion    Elbow extension    Wrist flexion    Wrist extension    Wrist ulnar deviation    Wrist radial deviation    Wrist pronation    Wrist supination    Grip strength (lbs)    (Blank rows = not tested)  PALPATION:  TTP: right deltoid, pectoralis major and minor, and along incision   TODAY'S TREATMENT:                                                                                                                                         DATE:                                     EXERCISE LOG  Exercise Repetitions and Resistance Comments  Scapular retraction 10 reps   Wrist pronation and supination 10 reps    Ball squeeze              Blank cell = exercise not performed today   PATIENT EDUCATION: Education details: HEP, POC, healing, prognosis, x-ray results, anatomy, and goals for therapy Person educated: Patient Education method: Explanation Education comprehension: verbalized understanding  HOME EXERCISE PROGRAM: Today's interventions were added to his HEP. He reported understanding.   ASSESSMENT:  CLINICAL IMPRESSION: Patient is a 82 y.o. male who was seen today for physical therapy evaluation and treatment following a right reverse total shoulder arthroplasty on 03/26/23. He presented with moderate pain severity and irritability with right shoulder passive range of motion being the most aggravating to his familiar symptoms. He was provided a HEP and he was able to properly demonstrate these interventions. He reported feeling comfortable with these interventions. He continues to require skilled physical therapy to address his remaining impairments to return to his prior level of function.    OBJECTIVE IMPAIRMENTS: decreased activity tolerance, decreased knowledge of condition, decreased mobility, decreased ROM, decreased strength, hypomobility, increased edema,  impaired tone, impaired UE functional use, and pain.   ACTIVITY LIMITATIONS: carrying, lifting, bathing, dressing, hygiene/grooming, and locomotion level  PARTICIPATION LIMITATIONS: meal prep, cleaning, laundry, shopping, and community activity  PERSONAL FACTORS: Age, Time since onset of injury/illness/exacerbation, and 3+ comorbidities: Hypertension, atrial fibrillation, COPD, arthritis, allergies, history of cancer, and congestive heart failure  are also affecting patient's functional outcome.   REHAB POTENTIAL: Good  CLINICAL DECISION MAKING: Evolving/moderate complexity  EVALUATION COMPLEXITY: Moderate   GOALS: Goals reviewed with patient? Yes  SHORT TERM GOALS: Target date: 04/20/23  Patient will be independent with his initial HEP.  Baseline: Goal status: INITIAL  2.  Patient will be able to achieve at least 70 degrees of active shoulder flexion for improved function reaching.  Baseline:  Goal status: INITIAL  3.  Patient will  be able to demonstrate at least 70 degrees of active right shoulder abduction for improved shoulder mobility.  Baseline:  Goal status: INITIAL  4.  Patient will reported being able to don and doff his clothing with minimal to no difficulty for improved independence.  Baseline:  Goal status: INITIAL  LONG TERM GOALS: Target date: 05/11/23  Patient will be independent with his advanced HEP.  Baseline:  Goal status: INITIAL  2.  Patient will be able to demonstrate at least 100 degrees of right shoulder flexion for improved function reaching overhead.  Baseline:  Goal status: INITIAL  3.  Patient will be able to demonstrate at least 100 degrees of active right shoulder abduction for improved function reaching.  Baseline:  Goal status: INITIAL  4.  Patient will be able to carry at least 5 pounds with his right upper extremity for improved function with his household activities.  Baseline:  Goal status: INITIAL  PLAN:  PT FREQUENCY:  2x/week  PT DURATION: 6 weeks  PLANNED INTERVENTIONS: Therapeutic exercises, Therapeutic activity, Neuromuscular re-education, Patient/Family education, Self Care, Joint mobilization, Electrical stimulation, Cryotherapy, Moist heat, Vasopneumatic device, Manual therapy, and Re-evaluation  PLAN FOR NEXT SESSION: Pulleys, isometrics, PROM, pendulums, and modalities as needed    Granville Lewis, PT 03/30/2023, 4:50 PM

## 2023-03-31 LAB — AEROBIC/ANAEROBIC CULTURE W GRAM STAIN (SURGICAL/DEEP WOUND)

## 2023-04-02 ENCOUNTER — Ambulatory Visit: Payer: Medicare Other | Attending: Internal Medicine | Admitting: *Deleted

## 2023-04-02 DIAGNOSIS — I480 Paroxysmal atrial fibrillation: Secondary | ICD-10-CM | POA: Diagnosis not present

## 2023-04-02 DIAGNOSIS — Z5181 Encounter for therapeutic drug level monitoring: Secondary | ICD-10-CM | POA: Diagnosis not present

## 2023-04-02 LAB — POCT INR: INR: 2 (ref 2.0–3.0)

## 2023-04-02 LAB — AEROBIC/ANAEROBIC CULTURE W GRAM STAIN (SURGICAL/DEEP WOUND)

## 2023-04-02 NOTE — Patient Instructions (Addendum)
Description   Today take 2.5mg  then continue taking warfarin 1.25mg  daily except  2.5mg  on Mondays, Wednesdays and Fridays. Recheck INR in 3 weeks (normally 6 weeks).  Call with any new medications or procedure/surgery dates. [facility] [phone].  Fax number for shoulder procedure clearance [phone].

## 2023-04-03 DIAGNOSIS — M19011 Primary osteoarthritis, right shoulder: Secondary | ICD-10-CM | POA: Diagnosis not present

## 2023-04-07 ENCOUNTER — Ambulatory Visit: Payer: Medicare Other

## 2023-04-07 DIAGNOSIS — M25511 Pain in right shoulder: Secondary | ICD-10-CM | POA: Diagnosis not present

## 2023-04-07 DIAGNOSIS — M25611 Stiffness of right shoulder, not elsewhere classified: Secondary | ICD-10-CM

## 2023-04-07 NOTE — Therapy (Signed)
OUTPATIENT PHYSICAL THERAPY SHOULDER TREATMENT   Patient Name: Larry Bautista MRN: [id number] DOB:Dec 27, 1940, 82 y.o., male Today's Date: 04/07/2023  END OF SESSION:  PT End of Session - 04/07/23 1308     Visit Number 2    Number of Visits 12    Date for PT Re-Evaluation 06/05/23    PT Start Time 1300    PT Stop Time 1344    PT Time Calculation (min) 44 min    Activity Tolerance Patient tolerated treatment well    Behavior During Therapy [facility] for tasks assessed/performed              Past Medical History:  Diagnosis Date   Allergy    Arthritis    Blood transfusion without reported diagnosis    Cancer (HCC)    esoph-chemo and rad   CHF (congestive heart failure) (HCC)    Chronic knee pain    Coronary artery disease    with LAD DE stent 2004   Dysplastic nevus 03/26/2016   right mid med calf   Dysrhythmia    Heel spur, left    Hyperlipidemia    Hypertension    Kidney stones    Lower back pain    status post surgery for spondylolisthesis   Migraine    Persistent atrial fibrillation (HCC)    longstanding persistent (since 05/2008)   Plantar fasciitis    Past Surgical History:  Procedure Laterality Date   BACK SURGERY     spondylolisthesis   CARDIAC CATHETERIZATION  03/06/2003   CORONARY ANGIOPLASTY WITH STENT PLACEMENT  04/09/2003   CYPHER stent implantation in the LAD   ESOPHAGOGASTRODUODENOSCOPY (EGD) WITH PROPOFOL N/A 01/07/2018   Procedure: ESOPHAGOGASTRODUODENOSCOPY (EGD) WITH PROPOFOL;  Surgeon: Graylin Shiver, MD;  Location: [facility] ENDOSCOPY;  Service: Endoscopy;  Laterality: N/A;   HAND SURGERY Right 09/08/2012   carpal tunnel   HERNIA REPAIR     INGUINAL HERNIA REPAIR Right 04/26/2020   Procedure: RIGHT INGUINAL HERNIA REPAIR WITH MESH;  Surgeon: Abigail Miyamoto, MD;  Location: [facility];  Service: General;  Laterality: Right;   REVERSE SHOULDER ARTHROPLASTY Right 03/26/2023   Procedure: REVERSE SHOULDER ARTHROPLASTY;  Surgeon:  Bjorn Pippin, MD;  Location: [facility];  Service: Orthopedics;  Laterality: Right;   SKIN CANCER EXCISION  05/10/1979   Face   SPINE SURGERY  2000   VASCULAR SURGERY     Vascular Stent   Patient Active Problem List   Diagnosis Date Noted   Gait disorder 01/14/2023   Hematoma of left knee region 08/22/2019   Knee pain 08/21/2019   Osteoarthritis of right shoulder 10/15/2018   Pulmonary nodule 09/30/2018   Adenocarcinoma of gastroesophageal junction (HCC) 01/14/2018   MRSA nasal colonization 01/12/2018   Acute GI bleeding 01/07/2018   Acute blood loss anemia 01/07/2018   COPD with chronic bronchitis 12/08/2017   Dyspnea 10/01/2016   Abnormal PFTs 10/01/2016   Hyperglycemia 03/28/2015   Allergic rhinitis 01/24/2015   Arthritis 01/24/2015   Basal cell carcinoma-1990 01/24/2015   BPH (benign prostatic hyperplasia) 01/24/2015   BMI 29.0-29.9,adult 01/24/2015   H/O renal calculi 01/24/2015   Headache, migraine 01/24/2015   AF (paroxysmal atrial fibrillation) (HCC) 01/24/2015   Spinal stenosis 01/24/2015   SPL (spondylolisthesis) 01/24/2015   Benign paroxysmal positional nystagmus 01/24/2015   Encounter for therapeutic drug monitoring 11/02/2013   Hyperlipidemia 05/03/2009   MIGRAINE HEADACHE 05/03/2009   Essential hypertension 05/03/2009   CAD in native artery 05/03/2009   KNEE  PAIN 05/03/2009   Backache 05/03/2009   History of tobacco use 05/03/2009    PCP: Jeani Sow, MD  REFERRING PROVIDER: Bjorn Pippin, MD   REFERRING DIAG: S/P Right Reverse Total Shoulder Replacement-DOS 03/26/23   THERAPY DIAG:  Stiffness of right shoulder, not elsewhere classified  Acute pain of right shoulder  Rationale for Evaluation and Treatment: Rehabilitation  ONSET DATE: 03/26/23  SUBJECTIVE:                                                                                                                                                                                       SUBJECTIVE STATEMENT: Patient reports that his shoulder is getting better.  Hand dominance: Left  PERTINENT HISTORY: Hypertension, atrial fibrillation, COPD, arthritis, allergies, history of cancer, and congestive heart failure  PAIN:  Are you having pain? Yes: NPRS scale: 2/10 Pain location: right shoulder and bicep Pain description: constant sore and ache Aggravating factors: none known Relieving factors: ice and medication  PRECAUTIONS: Shoulder  RED FLAGS: None   WEIGHT BEARING RESTRICTIONS: No  FALLS:  Has patient fallen in last 6 months?  No, but he has had balance problems since he had chemotherapy approximately 3 years ago  LIVING ENVIRONMENT: Lives with: lives with their spouse Lives in: House/apartment Has following equipment at home: Single point cane  OCCUPATION: Retired  PLOF: Independent  PATIENT GOALS:improved use of his right shoulder (driving, getting dressed)  NEXT MD VISIT: 04/10/23  OBJECTIVE: all objective measures were assessed at his initial evaluation on 03/30/23 unless otherwise noted  PATIENT SURVEYS:  FOTO 11.54  COGNITION: Overall cognitive status: Within functional limits for tasks assessed     SENSATION: Patient reports that he has some numbness in his left hand from chemotherapy.   UPPER EXTREMITY ROM:   Active ROM Right eval Left eval  Shoulder flexion 30 115  Shoulder extension    Shoulder abduction 30 102  Shoulder adduction    Shoulder internal rotation  To L3  Shoulder external rotation -15 To T2  Elbow flexion    Elbow extension    Wrist flexion    Wrist extension    Wrist ulnar deviation    Wrist radial deviation    Wrist pronation    Wrist supination    (Blank rows = not tested)  UPPER EXTREMITY MMT: not tested due due to surgical condition  MMT Right eval Left eval  Shoulder flexion    Shoulder extension    Shoulder abduction    Shoulder adduction    Shoulder internal rotation    Shoulder  external rotation    Middle trapezius    Lower trapezius  Elbow flexion    Elbow extension    Wrist flexion    Wrist extension    Wrist ulnar deviation    Wrist radial deviation    Wrist pronation    Wrist supination    Grip strength (lbs)    (Blank rows = not tested)  PALPATION:  TTP: right deltoid, pectoralis major and minor, and along incision   TODAY'S TREATMENT:                                                                                                                                         DATE:                                    04/07/23 EXERCISE LOG  Exercise Repetitions and Resistance Comments  Pulleys  4 minutes  Flexion   Scapular retraction  30 reps    L shoulder ADD isometric  2 minutes w/ 5 second hold   L shoulder flexion isometric  15 reps w/ 5 second hold  Added to HEP   L elbow flexion  20 reps    L elbow extension isometric  10 reps w/ 5 second hold Added to HEP   Blank cell = exercise not performed today  Manual Therapy Soft Tissue Mobilization: left biceps, triceps, and deltoid, for reduced pain and tone Passive ROM: flexion, abduction, and external rotation, to tolerance; required cueing throughout to prevent muscle guarding                                     03/30/23 EXERCISE LOG  Exercise Repetitions and Resistance Comments  Scapular retraction 10 reps   Wrist pronation and supination 10 reps    Ball squeeze              Blank cell = exercise not performed today   PATIENT EDUCATION: Education details: HEP, precautions following surgery, activity modification to put his hearing aids in, activities to avoid (reaching behind his body), and expectations for soreness Person educated: Patient Education method: Explanation Education comprehension: verbalized understanding  HOME EXERCISE PROGRAM: Today's interventions (03/30/23) were added to his HEP. He reported understanding.   ASSESSMENT:  CLINICAL IMPRESSION: Patient was introduced to  isometric and active assisted range of motion interventions for light muscular engagement and shoulder mobility. He required minimal cueing with today's isometric interventions for proper positioning and force production to facilitate muscular engagement. Manual therapy focused on reduced pain and PROM through the [place] of soft tissue mobilization to his biceps and shoulder musculature. He was educated on his surgical precautions and how to modify his activities to stay within these precautions. He reported understanding and was able to properly demonstrate these modifications. He reported that his shoulder felt good  upon the conclusion of treatment. He continues to require skilled physical therapy to address his remaining impairments to return to his prior level of function.   OBJECTIVE IMPAIRMENTS: decreased activity tolerance, decreased knowledge of condition, decreased mobility, decreased ROM, decreased strength, hypomobility, increased edema, impaired tone, impaired UE functional use, and pain.   ACTIVITY LIMITATIONS: carrying, lifting, bathing, dressing, hygiene/grooming, and locomotion level  PARTICIPATION LIMITATIONS: meal prep, cleaning, laundry, shopping, and community activity  PERSONAL FACTORS: Age, Time since onset of injury/illness/exacerbation, and 3+ comorbidities: Hypertension, atrial fibrillation, COPD, arthritis, allergies, history of cancer, and congestive heart failure  are also affecting patient's functional outcome.   REHAB POTENTIAL: Good  CLINICAL DECISION MAKING: Evolving/moderate complexity  EVALUATION COMPLEXITY: Moderate   GOALS: Goals reviewed with patient? Yes  SHORT TERM GOALS: Target date: 04/20/23  Patient will be independent with his initial HEP.  Baseline: Goal status: INITIAL  2.  Patient will be able to achieve at least 70 degrees of active shoulder flexion for improved function reaching.  Baseline:  Goal status: INITIAL  3.  Patient will be able to  demonstrate at least 70 degrees of active right shoulder abduction for improved shoulder mobility.  Baseline:  Goal status: INITIAL  4.  Patient will reported being able to don and doff his clothing with minimal to no difficulty for improved independence.  Baseline:  Goal status: INITIAL  LONG TERM GOALS: Target date: 05/11/23  Patient will be independent with his advanced HEP.  Baseline:  Goal status: INITIAL  2.  Patient will be able to demonstrate at least 100 degrees of right shoulder flexion for improved function reaching overhead.  Baseline:  Goal status: INITIAL  3.  Patient will be able to demonstrate at least 100 degrees of active right shoulder abduction for improved function reaching.  Baseline:  Goal status: INITIAL  4.  Patient will be able to carry at least 5 pounds with his right upper extremity for improved function with his household activities.  Baseline:  Goal status: INITIAL  PLAN:  PT FREQUENCY: 2x/week  PT DURATION: 6 weeks  PLANNED INTERVENTIONS: Therapeutic exercises, Therapeutic activity, Neuromuscular re-education, Patient/Family education, Self Care, Joint mobilization, Electrical stimulation, Cryotherapy, Moist heat, Vasopneumatic device, Manual therapy, and Re-evaluation  PLAN FOR NEXT SESSION: Pulleys, isometrics, PROM, pendulums, and modalities as needed    Granville Lewis, PT 04/07/2023, 3:13 PM

## 2023-04-09 ENCOUNTER — Other Ambulatory Visit: Payer: Self-pay | Admitting: Family Medicine

## 2023-04-09 DIAGNOSIS — M19011 Primary osteoarthritis, right shoulder: Secondary | ICD-10-CM | POA: Diagnosis not present

## 2023-04-10 ENCOUNTER — Ambulatory Visit: Payer: Medicare Other | Attending: Orthopaedic Surgery | Admitting: Physical Therapy

## 2023-04-10 DIAGNOSIS — M25611 Stiffness of right shoulder, not elsewhere classified: Secondary | ICD-10-CM | POA: Insufficient documentation

## 2023-04-10 DIAGNOSIS — M25511 Pain in right shoulder: Secondary | ICD-10-CM | POA: Diagnosis not present

## 2023-04-10 NOTE — Therapy (Signed)
OUTPATIENT PHYSICAL THERAPY SHOULDER TREATMENT   Patient Name: Larry Bautista MRN: [id number] DOB:[birth year], 82 y.o., male Today's Date: 04/10/2023  END OF SESSION:  PT End of Session - 04/10/23 1415     Visit Number 3    Number of Visits 12    Date for PT Re-Evaluation 06/05/23    PT Start Time 1150    PT Stop Time 1237    PT Time Calculation (min) 47 min    Activity Tolerance Patient tolerated treatment well    Behavior During Therapy [facility] for tasks assessed/performed              Past Medical History:  Diagnosis Date   Allergy    Arthritis    Blood transfusion without reported diagnosis    Cancer (HCC)    esoph-chemo and rad   CHF (congestive heart failure) (HCC)    Chronic knee pain    Coronary artery disease    with LAD DE stent 2004   Dysplastic nevus 03/26/2016   right mid med calf   Dysrhythmia    Heel spur, left    Hyperlipidemia    Hypertension    Kidney stones    Lower back pain    status post surgery for spondylolisthesis   Migraine    Persistent atrial fibrillation (HCC)    longstanding persistent (since 05/2008)   Plantar fasciitis    Past Surgical History:  Procedure Laterality Date   BACK SURGERY     spondylolisthesis   CARDIAC CATHETERIZATION  03/06/2003   CORONARY ANGIOPLASTY WITH STENT PLACEMENT  04/09/2003   CYPHER stent implantation in the LAD   ESOPHAGOGASTRODUODENOSCOPY (EGD) WITH PROPOFOL N/A 01/07/2018   Procedure: ESOPHAGOGASTRODUODENOSCOPY (EGD) WITH PROPOFOL;  Surgeon: Graylin Shiver, MD;  Location: [facility];  Service: Endoscopy;  Laterality: N/A;   HAND SURGERY Right 09/08/2012   carpal tunnel   HERNIA REPAIR     INGUINAL HERNIA REPAIR Right 04/26/2020   Procedure: RIGHT INGUINAL HERNIA REPAIR WITH MESH;  Surgeon: Abigail Miyamoto, MD;  Location: [facility];  Service: General;  Laterality: Right;   REVERSE SHOULDER ARTHROPLASTY Right 03/26/2023   Procedure: REVERSE SHOULDER ARTHROPLASTY;  Surgeon:  Bjorn Pippin, MD;  Location: [facility];  Service: Orthopedics;  Laterality: Right;   SKIN CANCER EXCISION  05/10/1979   Face   SPINE SURGERY  2000   VASCULAR SURGERY     Vascular Stent   Patient Active Problem List   Diagnosis Date Noted   Gait disorder 01/14/2023   Hematoma of left knee region 08/22/2019   Knee pain 08/21/2019   Osteoarthritis of right shoulder 10/15/2018   Pulmonary nodule 09/30/2018   Adenocarcinoma of gastroesophageal junction (HCC) 01/14/2018   MRSA nasal colonization 01/12/2018   Acute GI bleeding 01/07/2018   Acute blood loss anemia 01/07/2018   COPD with chronic bronchitis 12/08/2017   Dyspnea 10/01/2016   Abnormal PFTs 10/01/2016   Hyperglycemia 03/28/2015   Allergic rhinitis 01/24/2015   Arthritis 01/24/2015   Basal cell carcinoma-1990 01/24/2015   BPH (benign prostatic hyperplasia) 01/24/2015   BMI 29.0-29.9,adult 01/24/2015   H/O renal calculi 01/24/2015   Headache, migraine 01/24/2015   AF (paroxysmal atrial fibrillation) (HCC) 01/24/2015   Spinal stenosis 01/24/2015   SPL (spondylolisthesis) 01/24/2015   Benign paroxysmal positional nystagmus 01/24/2015   Encounter for therapeutic drug monitoring 11/02/2013   Hyperlipidemia 05/03/2009   MIGRAINE HEADACHE 05/03/2009   Essential hypertension 05/03/2009   CAD in native artery 05/03/2009   KNEE  PAIN 05/03/2009   Backache 05/03/2009   History of tobacco use 05/03/2009    PCP: Jeani Sow, MD  REFERRING PROVIDER: Bjorn Pippin, MD   REFERRING DIAG: S/P Right Reverse Total Shoulder Replacement-DOS 03/26/23   THERAPY DIAG:  Stiffness of right shoulder, not elsewhere classified  Acute pain of right shoulder  Rationale for Evaluation and Treatment: Rehabilitation  ONSET DATE: 03/26/23  SUBJECTIVE:                                                                                                                                                                                       SUBJECTIVE STATEMENT:  No new complaints.  PERTINENT HISTORY: Hypertension, atrial fibrillation, COPD, arthritis, allergies, history of cancer, and congestive heart failure  PAIN:  Are you having pain? Yes: NPRS scale: 2/10 Pain location: right shoulder and bicep Pain description: constant sore and ache Aggravating factors: none known Relieving factors: ice and medication  PRECAUTIONS: Shoulder  RED FLAGS: None   WEIGHT BEARING RESTRICTIONS: No  FALLS:  Has patient fallen in last 6 months?  No, but he has had balance problems since he had chemotherapy approximately 3 years ago  LIVING ENVIRONMENT: Lives with: lives with their spouse Lives in: House/apartment Has following equipment at home: Single point cane  OCCUPATION: Retired  PLOF: Independent  PATIENT GOALS:improved use of his right shoulder (driving, getting dressed)  NEXT MD VISIT: 04/10/23  OBJECTIVE: all objective measures were assessed at his initial evaluation on 03/30/23 unless otherwise noted  PATIENT SURVEYS:  FOTO 11.54  COGNITION: Overall cognitive status: Within functional limits for tasks assessed     SENSATION: Patient reports that he has some numbness in his left hand from chemotherapy.   UPPER EXTREMITY ROM:   Active ROM Right eval Left eval  Shoulder flexion 30 115  Shoulder extension    Shoulder abduction 30 102  Shoulder adduction    Shoulder internal rotation  To L3  Shoulder external rotation -15 To T2  Elbow flexion    Elbow extension    Wrist flexion    Wrist extension    Wrist ulnar deviation    Wrist radial deviation    Wrist pronation    Wrist supination    (Blank rows = not tested)  UPPER EXTREMITY MMT: not tested due due to surgical condition  MMT Right eval Left eval  Shoulder flexion    Shoulder extension    Shoulder abduction    Shoulder adduction    Shoulder internal rotation    Shoulder external rotation    Middle trapezius    Lower trapezius     Elbow flexion    Elbow  extension    Wrist flexion    Wrist extension    Wrist ulnar deviation    Wrist radial deviation    Wrist pronation    Wrist supination    Grip strength (lbs)    (Blank rows = not tested)  PALPATION:  TTP: right deltoid, pectoralis major and minor, and along incision   TODAY'S TREATMENT:                                                                                                                                         DATE:                             04/10/23:  In supine:  Gentle PROM to patient's right shoulder into flexion and ER x 23 minutes f/b vasopneumatic (in seated position) with pillow between right elbow and thorax x 15 minutes.    PATIENT EDUCATION: Education details: See below Person educated: Patient Education method: Programmer, multimedia, demo Education comprehension: verbalized understanding, handout  HOME EXERCISE PROGRAM: HOME EXERCISE PROGRAM Created by [place]  Aug 2nd, 2024 View at [email or web address] using code: [email or web address] Total 1 Page 1 of 1 WAND EXTERNAL ROTATION - SUPINE ER Lie on your back holding a cane or wand with both hands.  On the affected side, place a small rolled up towel or pillow under your elbow. Maintain approx. 90 degree bend at the elbow with your arm approximately 30-45 degrees away from your side. GENTLE. PAIN-FREE Use your other arm to pull the wand/cane to rotate the affected arm back into a stretch. Hold and then return to starting position and then repeat. Repeat 10 Times Hold 15 Seconds Complete 1 Set Perform 4 Times a Day  ASSESSMENT:  CLINICAL IMPRESSION:  Patient did very well with treatment today comprised of gentle right shoulder passive range of motion per protocol.  Instructed him in a passive cane stretch to improve ER.  OBJECTIVE IMPAIRMENTS: decreased activity tolerance, decreased knowledge of condition, decreased mobility, decreased ROM, decreased strength, hypomobility, increased edema,  impaired tone, impaired UE functional use, and pain.   ACTIVITY LIMITATIONS: carrying, lifting, bathing, dressing, hygiene/grooming, and locomotion level  PARTICIPATION LIMITATIONS: meal prep, cleaning, laundry, shopping, and community activity  PERSONAL FACTORS: Age, Time since onset of injury/illness/exacerbation, and 3+ comorbidities: Hypertension, atrial fibrillation, COPD, arthritis, allergies, history of cancer, and congestive heart failure  are also affecting patient's functional outcome.   REHAB POTENTIAL: Good  CLINICAL DECISION MAKING: Evolving/moderate complexity  EVALUATION COMPLEXITY: Moderate   GOALS: Goals reviewed with patient? Yes  SHORT TERM GOALS: Target date: 04/20/23  Patient will be independent with his initial HEP.  Baseline: Goal status: INITIAL  2.  Patient will be able to achieve at least 70 degrees of active shoulder flexion for improved function reaching.  Baseline:  Goal status: INITIAL  3.  Patient will be able  to demonstrate at least 70 degrees of active right shoulder abduction for improved shoulder mobility.  Baseline:  Goal status: INITIAL  4.  Patient will reported being able to don and doff his clothing with minimal to no difficulty for improved independence.  Baseline:  Goal status: INITIAL  LONG TERM GOALS: Target date: 05/11/23  Patient will be independent with his advanced HEP.  Baseline:  Goal status: INITIAL  2.  Patient will be able to demonstrate at least 100 degrees of right shoulder flexion for improved function reaching overhead.  Baseline:  Goal status: INITIAL  3.  Patient will be able to demonstrate at least 100 degrees of active right shoulder abduction for improved function reaching.  Baseline:  Goal status: INITIAL  4.  Patient will be able to carry at least 5 pounds with his right upper extremity for improved function with his household activities.  Baseline:  Goal status: INITIAL  PLAN:  PT FREQUENCY:  2x/week  PT DURATION: 6 weeks  PLANNED INTERVENTIONS: Therapeutic exercises, Therapeutic activity, Neuromuscular re-education, Patient/Family education, Self Care, Joint mobilization, Electrical stimulation, Cryotherapy, Moist heat, Vasopneumatic device, Manual therapy, and Re-evaluation  PLAN FOR NEXT SESSION: Pulleys, isometrics, PROM, pendulums, and modalities as needed    , [place], PT 04/10/2023, 2:30 PM

## 2023-04-14 ENCOUNTER — Ambulatory Visit: Payer: Medicare Other

## 2023-04-14 DIAGNOSIS — M25611 Stiffness of right shoulder, not elsewhere classified: Secondary | ICD-10-CM | POA: Diagnosis not present

## 2023-04-14 DIAGNOSIS — M25511 Pain in right shoulder: Secondary | ICD-10-CM

## 2023-04-14 NOTE — Therapy (Signed)
OUTPATIENT PHYSICAL THERAPY SHOULDER TREATMENT   Patient Name: Larry Bautista MRN: [id number] DOB:1940-10-10, 82 y.o., male Today's Date: 04/14/2023  END OF SESSION:  PT End of Session - 04/14/23 1349     Visit Number 4    Number of Visits 12    Date for PT Re-Evaluation 06/05/23    PT Start Time 1345    PT Stop Time 1426    PT Time Calculation (min) 41 min    Activity Tolerance Patient tolerated treatment well    Behavior During Therapy [facility] for tasks assessed/performed              Past Medical History:  Diagnosis Date   Allergy    Arthritis    Blood transfusion without reported diagnosis    Cancer (HCC)    esoph-chemo and rad   CHF (congestive heart failure) (HCC)    Chronic knee pain    Coronary artery disease    with LAD DE stent 2004   Dysplastic nevus 03/26/2016   right mid med calf   Dysrhythmia    Heel spur, left    Hyperlipidemia    Hypertension    Kidney stones    Lower back pain    status post surgery for spondylolisthesis   Migraine    Persistent atrial fibrillation (HCC)    longstanding persistent (since 05/2008)   Plantar fasciitis    Past Surgical History:  Procedure Laterality Date   BACK SURGERY     spondylolisthesis   CARDIAC CATHETERIZATION  03/06/2003   CORONARY ANGIOPLASTY WITH STENT PLACEMENT  04/09/2003   CYPHER stent implantation in the LAD   ESOPHAGOGASTRODUODENOSCOPY (EGD) WITH PROPOFOL N/A 01/07/2018   Procedure: ESOPHAGOGASTRODUODENOSCOPY (EGD) WITH PROPOFOL;  Surgeon: Graylin Shiver, MD;  Location: [facility] ENDOSCOPY;  Service: Endoscopy;  Laterality: N/A;   HAND SURGERY Right 09/08/2012   carpal tunnel   HERNIA REPAIR     INGUINAL HERNIA REPAIR Right 04/26/2020   Procedure: RIGHT INGUINAL HERNIA REPAIR WITH MESH;  Surgeon: Abigail Miyamoto, MD;  Location: [facility];  Service: General;  Laterality: Right;   REVERSE SHOULDER ARTHROPLASTY Right 03/26/2023   Procedure: REVERSE SHOULDER ARTHROPLASTY;  Surgeon:  Bjorn Pippin, MD;  Location: [facility];  Service: Orthopedics;  Laterality: Right;   SKIN CANCER EXCISION  05/10/1979   Face   SPINE SURGERY  2000   VASCULAR SURGERY     Vascular Stent   Patient Active Problem List   Diagnosis Date Noted   Gait disorder 01/14/2023   Hematoma of left knee region 08/22/2019   Knee pain 08/21/2019   Osteoarthritis of right shoulder 10/15/2018   Pulmonary nodule 09/30/2018   Adenocarcinoma of gastroesophageal junction (HCC) 01/14/2018   MRSA nasal colonization 01/12/2018   Acute GI bleeding 01/07/2018   Acute blood loss anemia 01/07/2018   COPD with chronic bronchitis 12/08/2017   Dyspnea 10/01/2016   Abnormal PFTs 10/01/2016   Hyperglycemia 03/28/2015   Allergic rhinitis 01/24/2015   Arthritis 01/24/2015   Basal cell carcinoma-1990 01/24/2015   BPH (benign prostatic hyperplasia) 01/24/2015   BMI 29.0-29.9,adult 01/24/2015   H/O renal calculi 01/24/2015   Headache, migraine 01/24/2015   AF (paroxysmal atrial fibrillation) (HCC) 01/24/2015   Spinal stenosis 01/24/2015   SPL (spondylolisthesis) 01/24/2015   Benign paroxysmal positional nystagmus 01/24/2015   Encounter for therapeutic drug monitoring 11/02/2013   Hyperlipidemia 05/03/2009   MIGRAINE HEADACHE 05/03/2009   Essential hypertension 05/03/2009   CAD in native artery 05/03/2009   KNEE  PAIN 05/03/2009   Backache 05/03/2009   History of tobacco use 05/03/2009    PCP: Jeani Sow, MD  REFERRING PROVIDER: Bjorn Pippin, MD   REFERRING DIAG: S/P Right Reverse Total Shoulder Replacement-DOS 03/26/23   THERAPY DIAG:  Stiffness of right shoulder, not elsewhere classified  Acute pain of right shoulder  Rationale for Evaluation and Treatment: Rehabilitation  ONSET DATE: 03/26/23  SUBJECTIVE:                                                                                                                                                                                       SUBJECTIVE STATEMENT:  No new complaints.  PERTINENT HISTORY: Hypertension, atrial fibrillation, COPD, arthritis, allergies, history of cancer, and congestive heart failure  PAIN:  Are you having pain? Yes: NPRS scale: 2/10 Pain location: right shoulder and bicep Pain description: constant sore and ache Aggravating factors: none known Relieving factors: ice and medication  PRECAUTIONS: Shoulder  RED FLAGS: None   WEIGHT BEARING RESTRICTIONS: No  FALLS:  Has patient fallen in last 6 months?  No, but he has had balance problems since he had chemotherapy approximately 3 years ago  LIVING ENVIRONMENT: Lives with: lives with their spouse Lives in: House/apartment Has following equipment at home: Single point cane  OCCUPATION: Retired  PLOF: Independent  PATIENT GOALS:improved use of his right shoulder (driving, getting dressed)  NEXT MD VISIT: 04/10/23  OBJECTIVE: all objective measures were assessed at his initial evaluation on 03/30/23 unless otherwise noted  PATIENT SURVEYS:  FOTO 11.54  COGNITION: Overall cognitive status: Within functional limits for tasks assessed     SENSATION: Patient reports that he has some numbness in his left hand from chemotherapy.   UPPER EXTREMITY ROM:   Active ROM Right eval Left eval  Shoulder flexion 30 115  Shoulder extension    Shoulder abduction 30 102  Shoulder adduction    Shoulder internal rotation  To L3  Shoulder external rotation -15 To T2  Elbow flexion    Elbow extension    Wrist flexion    Wrist extension    Wrist ulnar deviation    Wrist radial deviation    Wrist pronation    Wrist supination    (Blank rows = not tested)  UPPER EXTREMITY MMT: not tested due due to surgical condition  MMT Right eval Left eval  Shoulder flexion    Shoulder extension    Shoulder abduction    Shoulder adduction    Shoulder internal rotation    Shoulder external rotation    Middle trapezius    Lower trapezius     Elbow flexion    Elbow  extension    Wrist flexion    Wrist extension    Wrist ulnar deviation    Wrist radial deviation    Wrist pronation    Wrist supination    Grip strength (lbs)    (Blank rows = not tested)  PALPATION:  TTP: right deltoid, pectoralis major and minor, and along incision   TODAY'S TREATMENT:                                                                                                                                         DATE:                             04/14/23:                                    EXERCISE LOG  Exercise Repetitions and Resistance Comments  Pulleys 4 mins   Rangers Flex/ext x 2 mins; CW and CCW x 2 mins each   Isometric Flex; abd; ext (to neutral) x 2 mins each   3-Way Bicep Curls 1# X20 reps each way   External Rotation Stretch X2 mins Tactile cues to keep elbow in   Blank cell = exercise not performed today   Manual Therapy Soft Tissue Mobilization: right deltoid, STW/M to right deltoid to decrease pain and tone Passive ROM: right shoulder, PROM into flexion and ER with gentle hold at end range to increase ROM     PATIENT EDUCATION: Education details: See below Person educated: Patient Education method: Programmer, multimedia, demo Education comprehension: verbalized understanding, handout  HOME EXERCISE PROGRAM: HOME EXERCISE PROGRAM Created by [place] Applegate Aug 2nd, 2024 View at [email or web address] using code: [email or web address] Total 1 Page 1 of 1 WAND EXTERNAL ROTATION - SUPINE ER Lie on your back holding a cane or wand with both hands.  On the affected side, place a small rolled up towel or pillow under your elbow. Maintain approx. 90 degree bend at the elbow with your arm approximately 30-45 degrees away from your side. GENTLE. PAIN-FREE Use your other arm to pull the wand/cane to rotate the affected arm back into a stretch. Hold and then return to starting position and then repeat. Repeat 10 Times Hold 15 Seconds Complete 1 Set  Perform 4 Times a Day  ASSESSMENT:  CLINICAL IMPRESSION:  Pt arrives for today's treatment session reporting minimal right shoulder pain.  Pt with sling donned upon arriving for today's session.  Pt able to tolerate addition of 1# weights with seated 3-way bicep curls.  PROM performed into flexion and ER with gentle hold at end range.  STW/M performed to right deltoid to decrease pain and tone.  Pt declined use of vaso today.  Pt had questions about ER stretch, but all questions answered and confusion  clarified.  Pt reported minimal pain at completion of today's treatment session.   OBJECTIVE IMPAIRMENTS: decreased activity tolerance, decreased knowledge of condition, decreased mobility, decreased ROM, decreased strength, hypomobility, increased edema, impaired tone, impaired UE functional use, and pain.   ACTIVITY LIMITATIONS: carrying, lifting, bathing, dressing, hygiene/grooming, and locomotion level  PARTICIPATION LIMITATIONS: meal prep, cleaning, laundry, shopping, and community activity  PERSONAL FACTORS: Age, Time since onset of injury/illness/exacerbation, and 3+ comorbidities: Hypertension, atrial fibrillation, COPD, arthritis, allergies, history of cancer, and congestive heart failure  are also affecting patient's functional outcome.   REHAB POTENTIAL: Good  CLINICAL DECISION MAKING: Evolving/moderate complexity  EVALUATION COMPLEXITY: Moderate   GOALS: Goals reviewed with patient? Yes  SHORT TERM GOALS: Target date: 04/20/23  Patient will be independent with his initial HEP.  Baseline: Goal status: INITIAL  2.  Patient will be able to achieve at least 70 degrees of active shoulder flexion for improved function reaching.  Baseline:  Goal status: INITIAL  3.  Patient will be able to demonstrate at least 70 degrees of active right shoulder abduction for improved shoulder mobility.  Baseline:  Goal status: INITIAL  4.  Patient will reported being able to don and doff his  clothing with minimal to no difficulty for improved independence.  Baseline:  Goal status: INITIAL  LONG TERM GOALS: Target date: 05/11/23  Patient will be independent with his advanced HEP.  Baseline:  Goal status: INITIAL  2.  Patient will be able to demonstrate at least 100 degrees of right shoulder flexion for improved function reaching overhead.  Baseline:  Goal status: INITIAL  3.  Patient will be able to demonstrate at least 100 degrees of active right shoulder abduction for improved function reaching.  Baseline:  Goal status: INITIAL  4.  Patient will be able to carry at least 5 pounds with his right upper extremity for improved function with his household activities.  Baseline:  Goal status: INITIAL  PLAN:  PT FREQUENCY: 2x/week  PT DURATION: 6 weeks  PLANNED INTERVENTIONS: Therapeutic exercises, Therapeutic activity, Neuromuscular re-education, Patient/Family education, Self Care, Joint mobilization, Electrical stimulation, Cryotherapy, Moist heat, Vasopneumatic device, Manual therapy, and Re-evaluation  PLAN FOR NEXT SESSION: Pulleys, isometrics, PROM, pendulums, and modalities as needed    Newman Pies, PTA 04/14/2023, 3:01 PM

## 2023-04-15 ENCOUNTER — Other Ambulatory Visit: Payer: Self-pay | Admitting: *Deleted

## 2023-04-15 MED ORDER — FLUTICASONE PROPIONATE 50 MCG/ACT NA SUSP: 2.0000 | Freq: Every day | NASAL | 0 refills | Status: DC

## 2023-04-17 ENCOUNTER — Ambulatory Visit: Payer: Medicare Other | Admitting: Physical Therapy

## 2023-04-17 DIAGNOSIS — M25611 Stiffness of right shoulder, not elsewhere classified: Secondary | ICD-10-CM | POA: Diagnosis not present

## 2023-04-17 DIAGNOSIS — M25511 Pain in right shoulder: Secondary | ICD-10-CM

## 2023-04-17 NOTE — Therapy (Signed)
OUTPATIENT PHYSICAL THERAPY SHOULDER TREATMENT   Patient Name: Larry Bautista MRN: [id number] DOB:[birth year], 82 y.o., male Today's Date: 04/17/2023  END OF SESSION:  PT End of Session - 04/17/23 1310     Visit Number 5    Number of Visits 12    Date for PT Re-Evaluation 06/05/23    PT Start Time 1150    PT Stop Time 1240    PT Time Calculation (min) 50 min    Activity Tolerance Patient tolerated treatment well    Behavior During Therapy [facility] for tasks assessed/performed              Past Medical History:  Diagnosis Date   Allergy    Arthritis    Blood transfusion without reported diagnosis    Cancer (HCC)    esoph-chemo and rad   CHF (congestive heart failure) (HCC)    Chronic knee pain    Coronary artery disease    with LAD DE stent 2004   Dysplastic nevus 03/26/2016   right mid med calf   Dysrhythmia    Heel spur, left    Hyperlipidemia    Hypertension    Kidney stones    Lower back pain    status post surgery for spondylolisthesis   Migraine    Persistent atrial fibrillation (HCC)    longstanding persistent (since 05/2008)   Plantar fasciitis    Past Surgical History:  Procedure Laterality Date   BACK SURGERY     spondylolisthesis   CARDIAC CATHETERIZATION  03/06/2003   CORONARY ANGIOPLASTY WITH STENT PLACEMENT  04/09/2003   CYPHER stent implantation in the LAD   ESOPHAGOGASTRODUODENOSCOPY (EGD) WITH PROPOFOL N/A 01/07/2018   Procedure: ESOPHAGOGASTRODUODENOSCOPY (EGD) WITH PROPOFOL;  Surgeon: Graylin Shiver, MD;  Location: [facility] ENDOSCOPY;  Service: Endoscopy;  Laterality: N/A;   HAND SURGERY Right 09/08/2012   carpal tunnel   HERNIA REPAIR     INGUINAL HERNIA REPAIR Right 04/26/2020   Procedure: RIGHT INGUINAL HERNIA REPAIR WITH MESH;  Surgeon: Abigail Miyamoto, MD;  Location: [facility];  Service: General;  Laterality: Right;   REVERSE SHOULDER ARTHROPLASTY Right 03/26/2023   Procedure: REVERSE SHOULDER ARTHROPLASTY;  Surgeon:  Bjorn Pippin, MD;  Location:  SURGERY CENTER;  Service: Orthopedics;  Laterality: Right;   SKIN CANCER EXCISION  05/10/1979   Face   SPINE SURGERY  2000   VASCULAR SURGERY     Vascular Stent   Patient Active Problem List   Diagnosis Date Noted   Gait disorder 01/14/2023   Hematoma of left knee region 08/22/2019   Knee pain 08/21/2019   Osteoarthritis of right shoulder 10/15/2018   Pulmonary nodule 09/30/2018   Adenocarcinoma of gastroesophageal junction (HCC) 01/14/2018   MRSA nasal colonization 01/12/2018   Acute GI bleeding 01/07/2018   Acute blood loss anemia 01/07/2018   COPD with chronic bronchitis 12/08/2017   Dyspnea 10/01/2016   Abnormal PFTs 10/01/2016   Hyperglycemia 03/28/2015   Allergic rhinitis 01/24/2015   Arthritis 01/24/2015   Basal cell carcinoma-1990 01/24/2015   BPH (benign prostatic hyperplasia) 01/24/2015   BMI 29.0-29.9,adult 01/24/2015   H/O renal calculi 01/24/2015   Headache, migraine 01/24/2015   AF (paroxysmal atrial fibrillation) (HCC) 01/24/2015   Spinal stenosis 01/24/2015   SPL (spondylolisthesis) 01/24/2015   Benign paroxysmal positional nystagmus 01/24/2015   Encounter for therapeutic drug monitoring 11/02/2013   Hyperlipidemia 05/03/2009   MIGRAINE HEADACHE 05/03/2009   Essential hypertension 05/03/2009   CAD in native artery 05/03/2009   KNEE  PAIN 05/03/2009   Backache 05/03/2009   History of tobacco use 05/03/2009    PCP: Jeani Sow, MD  REFERRING PROVIDER: Bjorn Pippin, MD   REFERRING DIAG: S/P Right Reverse Total Shoulder Replacement-DOS 03/26/23   THERAPY DIAG:  Stiffness of right shoulder, not elsewhere classified  Acute pain of right shoulder  Rationale for Evaluation and Treatment: Rehabilitation  ONSET DATE: 03/26/23  SUBJECTIVE:                                                                                                                                                                                       SUBJECTIVE STATEMENT:  No new complaints.  PERTINENT HISTORY: Hypertension, atrial fibrillation, COPD, arthritis, allergies, history of cancer, and congestive heart failure  PAIN:  Are you having pain? Yes: NPRS scale: 2/10 Pain location: right shoulder and bicep Pain description: constant sore and ache Aggravating factors: none known Relieving factors: ice and medication  PRECAUTIONS: Shoulder  RED FLAGS: None   WEIGHT BEARING RESTRICTIONS: No  FALLS:  Has patient fallen in last 6 months?  No, but he has had balance problems since he had chemotherapy approximately 3 years ago  LIVING ENVIRONMENT: Lives with: lives with their spouse Lives in: House/apartment Has following equipment at home: Single point cane  OCCUPATION: Retired  PLOF: Independent  PATIENT GOALS:improved use of his right shoulder (driving, getting dressed)  NEXT MD VISIT: 04/10/23  OBJECTIVE: all objective measures were assessed at his initial evaluation on 03/30/23 unless otherwise noted  PATIENT SURVEYS:  FOTO 11.54  COGNITION: Overall cognitive status: Within functional limits for tasks assessed     SENSATION: Patient reports that he has some numbness in his left hand from chemotherapy.   UPPER EXTREMITY ROM:   Active ROM Right eval Left eval  Shoulder flexion 30 115  Shoulder extension    Shoulder abduction 30 102  Shoulder adduction    Shoulder internal rotation  To L3  Shoulder external rotation -15 To T2  Elbow flexion    Elbow extension    Wrist flexion    Wrist extension    Wrist ulnar deviation    Wrist radial deviation    Wrist pronation    Wrist supination    (Blank rows = not tested)  UPPER EXTREMITY MMT: not tested due due to surgical condition  MMT Right eval Left eval  Shoulder flexion    Shoulder extension    Shoulder abduction    Shoulder adduction    Shoulder internal rotation    Shoulder external rotation    Middle trapezius    Lower trapezius     Elbow flexion    Elbow  extension    Wrist flexion    Wrist extension    Wrist ulnar deviation    Wrist radial deviation    Wrist pronation    Wrist supination    Grip strength (lbs)    (Blank rows = not tested)  PALPATION:  TTP: right deltoid, pectoralis major and minor, and along incision   TODAY'S TREATMENT:                                                                                                                                         DATE:                             04/17/23:  In supine:  Gentle PROM to patient's right shoulder into flexion and ER x 25 minutes f/b vasopneumatic (in seated position) with pillow between right elbow and thorax x 15 minutes.    PATIENT EDUCATION: Education details: See below Person educated: Patient Education method: Programmer, multimedia, demo Education comprehension: verbalized understanding, handout  HOME EXERCISE PROGRAM: HOME EXERCISE PROGRAM Created by [place]  Aug 2nd, 2024 View at [email or web address] using code: [email or web address] Total 1 Page 1 of 1 WAND EXTERNAL ROTATION - SUPINE ER Lie on your back holding a cane or wand with both hands.  On the affected side, place a small rolled up towel or pillow under your elbow. Maintain approx. 90 degree bend at the elbow with your arm approximately 30-45 degrees away from your side. GENTLE. PAIN-FREE Use your other arm to pull the wand/cane to rotate the affected arm back into a stretch. Hold and then return to starting position and then repeat. Repeat 10 Times Hold 15 Seconds Complete 1 Set Perform 4 Times a Day  ASSESSMENT:  CLINICAL IMPRESSION: Reviewed supine passive right shoulder stretch.  Patient did well with treatment today with PROM per protocol.  He archived gentle passive right shoulder ER to neutral and flexion to 85 degrees.  OBJECTIVE IMPAIRMENTS: decreased activity tolerance, decreased knowledge of condition, decreased mobility, decreased ROM, decreased strength,  hypomobility, increased edema, impaired tone, impaired UE functional use, and pain.   ACTIVITY LIMITATIONS: carrying, lifting, bathing, dressing, hygiene/grooming, and locomotion level  PARTICIPATION LIMITATIONS: meal prep, cleaning, laundry, shopping, and community activity  PERSONAL FACTORS: Age, Time since onset of injury/illness/exacerbation, and 3+ comorbidities: Hypertension, atrial fibrillation, COPD, arthritis, allergies, history of cancer, and congestive heart failure  are also affecting patient's functional outcome.   REHAB POTENTIAL: Good  CLINICAL DECISION MAKING: Evolving/moderate complexity  EVALUATION COMPLEXITY: Moderate   GOALS: Goals reviewed with patient? Yes  SHORT TERM GOALS: Target date: 04/20/23  Patient will be independent with his initial HEP.  Baseline: Goal status: INITIAL  2.  Patient will be able to achieve at least 70 degrees of active shoulder flexion for improved function reaching.  Baseline:  Goal status: INITIAL  3.  Patient will  be able to demonstrate at least 70 degrees of active right shoulder abduction for improved shoulder mobility.  Baseline:  Goal status: INITIAL  4.  Patient will reported being able to don and doff his clothing with minimal to no difficulty for improved independence.  Baseline:  Goal status: INITIAL  LONG TERM GOALS: Target date: 05/11/23  Patient will be independent with his advanced HEP.  Baseline:  Goal status: INITIAL  2.  Patient will be able to demonstrate at least 100 degrees of right shoulder flexion for improved function reaching overhead.  Baseline:  Goal status: INITIAL  3.  Patient will be able to demonstrate at least 100 degrees of active right shoulder abduction for improved function reaching.  Baseline:  Goal status: INITIAL  4.  Patient will be able to carry at least 5 pounds with his right upper extremity for improved function with his household activities.  Baseline:  Goal status:  INITIAL  PLAN:  PT FREQUENCY: 2x/week  PT DURATION: 6 weeks  PLANNED INTERVENTIONS: Therapeutic exercises, Therapeutic activity, Neuromuscular re-education, Patient/Family education, Self Care, Joint mobilization, Electrical stimulation, Cryotherapy, Moist heat, Vasopneumatic device, Manual therapy, and Re-evaluation  PLAN FOR NEXT SESSION: Pulleys, isometrics, PROM, pendulums, and modalities as needed    , [place], PT 04/17/2023, 1:18 PM

## 2023-04-20 ENCOUNTER — Other Ambulatory Visit: Payer: Self-pay | Admitting: Cardiology

## 2023-04-20 ENCOUNTER — Other Ambulatory Visit (HOSPITAL_BASED_OUTPATIENT_CLINIC_OR_DEPARTMENT_OTHER): Payer: Self-pay | Admitting: Cardiology

## 2023-04-21 ENCOUNTER — Ambulatory Visit: Payer: Medicare Other | Admitting: Physical Therapy

## 2023-04-21 DIAGNOSIS — M25511 Pain in right shoulder: Secondary | ICD-10-CM

## 2023-04-21 DIAGNOSIS — M25611 Stiffness of right shoulder, not elsewhere classified: Secondary | ICD-10-CM | POA: Diagnosis not present

## 2023-04-21 NOTE — Therapy (Signed)
OUTPATIENT PHYSICAL THERAPY SHOULDER TREATMENT   Patient Name: Larry Bautista MRN: [id number] DOB:12/21/1940, 82 y.o., male Today's Date: 04/21/2023  END OF SESSION:  PT End of Session - 04/21/23 1424     Visit Number 6    Number of Visits 12    Date for PT Re-Evaluation 06/05/23    PT Start Time 0145    PT Stop Time 0233    PT Time Calculation (min) 48 min    Activity Tolerance Patient tolerated treatment well    Behavior During Therapy [facility] for tasks assessed/performed              Past Medical History:  Diagnosis Date   Allergy    Arthritis    Blood transfusion without reported diagnosis    Cancer (HCC)    esoph-chemo and rad   CHF (congestive heart failure) (HCC)    Chronic knee pain    Coronary artery disease    with LAD DE stent 2004   Dysplastic nevus 03/26/2016   right mid med calf   Dysrhythmia    Heel spur, left    Hyperlipidemia    Hypertension    Kidney stones    Lower back pain    status post surgery for spondylolisthesis   Migraine    Persistent atrial fibrillation (HCC)    longstanding persistent (since 05/2008)   Plantar fasciitis    Past Surgical History:  Procedure Laterality Date   BACK SURGERY     spondylolisthesis   CARDIAC CATHETERIZATION  03/06/2003   CORONARY ANGIOPLASTY WITH STENT PLACEMENT  04/09/2003   CYPHER stent implantation in the LAD   ESOPHAGOGASTRODUODENOSCOPY (EGD) WITH PROPOFOL N/A 01/07/2018   Procedure: ESOPHAGOGASTRODUODENOSCOPY (EGD) WITH PROPOFOL;  Surgeon: Graylin Shiver, MD;  Location: [facility] ENDOSCOPY;  Service: Endoscopy;  Laterality: N/A;   HAND SURGERY Right 09/08/2012   carpal tunnel   HERNIA REPAIR     INGUINAL HERNIA REPAIR Right 04/26/2020   Procedure: RIGHT INGUINAL HERNIA REPAIR WITH MESH;  Surgeon: Abigail Miyamoto, MD;  Location: [facility];  Service: General;  Laterality: Right;   REVERSE SHOULDER ARTHROPLASTY Right 03/26/2023   Procedure: REVERSE SHOULDER ARTHROPLASTY;  Surgeon:  Bjorn Pippin, MD;  Location: [facility];  Service: Orthopedics;  Laterality: Right;   SKIN CANCER EXCISION  05/10/1979   Face   SPINE SURGERY  2000   VASCULAR SURGERY     Vascular Stent   Patient Active Problem List   Diagnosis Date Noted   Gait disorder 01/14/2023   Hematoma of left knee region 08/22/2019   Knee pain 08/21/2019   Osteoarthritis of right shoulder 10/15/2018   Pulmonary nodule 09/30/2018   Adenocarcinoma of gastroesophageal junction (HCC) 01/14/2018   MRSA nasal colonization 01/12/2018   Acute GI bleeding 01/07/2018   Acute blood loss anemia 01/07/2018   COPD with chronic bronchitis 12/08/2017   Dyspnea 10/01/2016   Abnormal PFTs 10/01/2016   Hyperglycemia 03/28/2015   Allergic rhinitis 01/24/2015   Arthritis 01/24/2015   Basal cell carcinoma-1990 01/24/2015   BPH (benign prostatic hyperplasia) 01/24/2015   BMI 29.0-29.9,adult 01/24/2015   H/O renal calculi 01/24/2015   Headache, migraine 01/24/2015   AF (paroxysmal atrial fibrillation) (HCC) 01/24/2015   Spinal stenosis 01/24/2015   SPL (spondylolisthesis) 01/24/2015   Benign paroxysmal positional nystagmus 01/24/2015   Encounter for therapeutic drug monitoring 11/02/2013   Hyperlipidemia 05/03/2009   MIGRAINE HEADACHE 05/03/2009   Essential hypertension 05/03/2009   CAD in native artery 05/03/2009   KNEE  PAIN 05/03/2009   Backache 05/03/2009   History of tobacco use 05/03/2009    PCP: Jeani Sow, MD  REFERRING PROVIDER: Bjorn Pippin, MD   REFERRING DIAG: S/P Right Reverse Total Shoulder Replacement-DOS 03/26/23   THERAPY DIAG:  Stiffness of right shoulder, not elsewhere classified  Acute pain of right shoulder  Rationale for Evaluation and Treatment: Rehabilitation  ONSET DATE: 03/26/23  SUBJECTIVE:                                                                                                                                                                                       SUBJECTIVE STATEMENT:  No new complaints.  PERTINENT HISTORY: Hypertension, atrial fibrillation, COPD, arthritis, allergies, history of cancer, and congestive heart failure  PAIN:  Are you having pain? Yes: NPRS scale: 2/10 Pain location: right shoulder and bicep Pain description: constant sore and ache Aggravating factors: none known Relieving factors: ice and medication  PRECAUTIONS: Shoulder  RED FLAGS: None   WEIGHT BEARING RESTRICTIONS: No  FALLS:  Has patient fallen in last 6 months?  No, but he has had balance problems since he had chemotherapy approximately 3 years ago  LIVING ENVIRONMENT: Lives with: lives with their spouse Lives in: House/apartment Has following equipment at home: Single point cane  OCCUPATION: Retired  PLOF: Independent  PATIENT GOALS:improved use of his right shoulder (driving, getting dressed)  NEXT MD VISIT: 04/10/23  OBJECTIVE: all objective measures were assessed at his initial evaluation on 03/30/23 unless otherwise noted  PATIENT SURVEYS:  FOTO 11.54  COGNITION: Overall cognitive status: Within functional limits for tasks assessed     SENSATION: Patient reports that he has some numbness in his left hand from chemotherapy.   UPPER EXTREMITY ROM:   Active ROM Right eval Left eval  Shoulder flexion 30 115  Shoulder extension    Shoulder abduction 30 102  Shoulder adduction    Shoulder internal rotation  To L3  Shoulder external rotation -15 To T2  Elbow flexion    Elbow extension    Wrist flexion    Wrist extension    Wrist ulnar deviation    Wrist radial deviation    Wrist pronation    Wrist supination    (Blank rows = not tested)  UPPER EXTREMITY MMT: not tested due due to surgical condition  MMT Right eval Left eval  Shoulder flexion    Shoulder extension    Shoulder abduction    Shoulder adduction    Shoulder internal rotation    Shoulder external rotation    Middle trapezius    Lower trapezius     Elbow flexion    Elbow  extension    Wrist flexion    Wrist extension    Wrist ulnar deviation    Wrist radial deviation    Wrist pronation    Wrist supination    Grip strength (lbs)    (Blank rows = not tested)  PALPATION:  TTP: right deltoid, pectoralis major and minor, and along incision   TODAY'S TREATMENT:                                                                                                                                         DATE:                             04/17/23:  In supine:  Gentle PROM to patient's right shoulder into flexion and ER (per protocol) x 23 minutes f/b vasopneumatic (in seated position) with pillow between right elbow and thorax x 15 minutes.    PATIENT EDUCATION: Education details: See below Person educated: Patient Education method: Programmer, multimedia, demo Education comprehension: verbalized understanding, handout  HOME EXERCISE PROGRAM: HOME EXERCISE PROGRAM Created by [place]  Aug 2nd, 2024 View at [email or web address] using code: [email or web address] Total 1 Page 1 of 1 WAND EXTERNAL ROTATION - SUPINE ER Lie on your back holding a cane or wand with both hands.  On the affected side, place a small rolled up towel or pillow under your elbow. Maintain approx. 90 degree bend at the elbow with your arm approximately 30-45 degrees away from your side. GENTLE. PAIN-FREE Use your other arm to pull the wand/cane to rotate the affected arm back into a stretch. Hold and then return to starting position and then repeat. Repeat 10 Times Hold 15 Seconds Complete 1 Set Perform 4 Times a Day  ASSESSMENT:  CLINICAL IMPRESSION: Patient did well with treatment today with PROM per protocol.  Encouraged him to perform supine cane exercise to work in increasing ER.  He see the MD on Friday, 01/22/23.    OBJECTIVE IMPAIRMENTS: decreased activity tolerance, decreased knowledge of condition, decreased mobility, decreased ROM, decreased strength, hypomobility,  increased edema, impaired tone, impaired UE functional use, and pain.   ACTIVITY LIMITATIONS: carrying, lifting, bathing, dressing, hygiene/grooming, and locomotion level  PARTICIPATION LIMITATIONS: meal prep, cleaning, laundry, shopping, and community activity  PERSONAL FACTORS: Age, Time since onset of injury/illness/exacerbation, and 3+ comorbidities: Hypertension, atrial fibrillation, COPD, arthritis, allergies, history of cancer, and congestive heart failure  are also affecting patient's functional outcome.   REHAB POTENTIAL: Good  CLINICAL DECISION MAKING: Evolving/moderate complexity  EVALUATION COMPLEXITY: Moderate   GOALS: Goals reviewed with patient? Yes  SHORT TERM GOALS: Target date: 04/20/23  Patient will be independent with his initial HEP.  Baseline: Goal status: INITIAL  2.  Patient will be able to achieve at least 70 degrees of active shoulder flexion for improved function reaching.  Baseline:  Goal status: INITIAL  3.  Patient will be able to demonstrate at least 70 degrees of active right shoulder abduction for improved shoulder mobility.  Baseline:  Goal status: INITIAL  4.  Patient will reported being able to don and doff his clothing with minimal to no difficulty for improved independence.  Baseline:  Goal status: INITIAL  LONG TERM GOALS: Target date: 05/11/23  Patient will be independent with his advanced HEP.  Baseline:  Goal status: INITIAL  2.  Patient will be able to demonstrate at least 100 degrees of right shoulder flexion for improved function reaching overhead.  Baseline:  Goal status: INITIAL  3.  Patient will be able to demonstrate at least 100 degrees of active right shoulder abduction for improved function reaching.  Baseline:  Goal status: INITIAL  4.  Patient will be able to carry at least 5 pounds with his right upper extremity for improved function with his household activities.  Baseline:  Goal status: INITIAL  PLAN:  PT  FREQUENCY: 2x/week  PT DURATION: 6 weeks  PLANNED INTERVENTIONS: Therapeutic exercises, Therapeutic activity, Neuromuscular re-education, Patient/Family education, Self Care, Joint mobilization, Electrical stimulation, Cryotherapy, Moist heat, Vasopneumatic device, Manual therapy, and Re-evaluation  PLAN FOR NEXT SESSION: Pulleys, isometrics, PROM, pendulums, and modalities as needed    , [place], PT 04/21/2023, 2:59 PM

## 2023-04-23 ENCOUNTER — Ambulatory Visit: Payer: Medicare Other | Attending: Cardiology

## 2023-04-23 DIAGNOSIS — I480 Paroxysmal atrial fibrillation: Secondary | ICD-10-CM | POA: Insufficient documentation

## 2023-04-23 DIAGNOSIS — M19011 Primary osteoarthritis, right shoulder: Secondary | ICD-10-CM | POA: Diagnosis not present

## 2023-04-23 DIAGNOSIS — Z5181 Encounter for therapeutic drug level monitoring: Secondary | ICD-10-CM | POA: Diagnosis not present

## 2023-04-23 LAB — POCT INR: INR: 3.6 — AB (ref 2.0–3.0)

## 2023-04-23 NOTE — Patient Instructions (Signed)
Description   Skip today's dosage of Warfarin, then resume same dosage of Warfarin 1.25mg  daily except  2.5mg  on Mondays, Wednesdays and Fridays. Recheck INR in 3 weeks (normally 6 weeks).  Call with any new medications or procedure/surgery dates. [facility] [phone].  Fax number for shoulder procedure clearance [phone].

## 2023-04-24 ENCOUNTER — Ambulatory Visit: Payer: Medicare Other

## 2023-04-24 DIAGNOSIS — M25511 Pain in right shoulder: Secondary | ICD-10-CM

## 2023-04-24 DIAGNOSIS — M25611 Stiffness of right shoulder, not elsewhere classified: Secondary | ICD-10-CM

## 2023-04-24 NOTE — Therapy (Signed)
OUTPATIENT PHYSICAL THERAPY SHOULDER TREATMENT   Patient Name: Larry Bautista MRN: [id number] DOB:August 29, 1941, 82 y.o., male Today's Date: 04/24/2023  END OF SESSION:  PT End of Session - 04/24/23 1148     Visit Number 7    Number of Visits 12    Date for PT Re-Evaluation 06/05/23    PT Start Time 1145    PT Stop Time 1230    PT Time Calculation (min) 45 min    Activity Tolerance Patient tolerated treatment well    Behavior During Therapy [facility] for tasks assessed/performed              Past Medical History:  Diagnosis Date   Allergy    Arthritis    Blood transfusion without reported diagnosis    Cancer (HCC)    esoph-chemo and rad   CHF (congestive heart failure) (HCC)    Chronic knee pain    Coronary artery disease    with LAD DE stent 2004   Dysplastic nevus 03/26/2016   right mid med calf   Dysrhythmia    Heel spur, left    Hyperlipidemia    Hypertension    Kidney stones    Lower back pain    status post surgery for spondylolisthesis   Migraine    Persistent atrial fibrillation (HCC)    longstanding persistent (since 05/2008)   Plantar fasciitis    Past Surgical History:  Procedure Laterality Date   BACK SURGERY     spondylolisthesis   CARDIAC CATHETERIZATION  03/06/2003   CORONARY ANGIOPLASTY WITH STENT PLACEMENT  04/09/2003   CYPHER stent implantation in the LAD   ESOPHAGOGASTRODUODENOSCOPY (EGD) WITH PROPOFOL N/A 01/07/2018   Procedure: ESOPHAGOGASTRODUODENOSCOPY (EGD) WITH PROPOFOL;  Surgeon: Graylin Shiver, MD;  Location: [facility] ENDOSCOPY;  Service: Endoscopy;  Laterality: N/A;   HAND SURGERY Right 09/08/2012   carpal tunnel   HERNIA REPAIR     INGUINAL HERNIA REPAIR Right 04/26/2020   Procedure: RIGHT INGUINAL HERNIA REPAIR WITH MESH;  Surgeon: Abigail Miyamoto, MD;  Location: [facility];  Service: General;  Laterality: Right;   REVERSE SHOULDER ARTHROPLASTY Right 03/26/2023   Procedure: REVERSE SHOULDER ARTHROPLASTY;  Surgeon:  Bjorn Pippin, MD;  Location: [facility];  Service: Orthopedics;  Laterality: Right;   SKIN CANCER EXCISION  05/10/1979   Face   SPINE SURGERY  2000   VASCULAR SURGERY     Vascular Stent   Patient Active Problem List   Diagnosis Date Noted   Gait disorder 01/14/2023   Hematoma of left knee region 08/22/2019   Knee pain 08/21/2019   Osteoarthritis of right shoulder 10/15/2018   Pulmonary nodule 09/30/2018   Adenocarcinoma of gastroesophageal junction (HCC) 01/14/2018   MRSA nasal colonization 01/12/2018   Acute GI bleeding 01/07/2018   Acute blood loss anemia 01/07/2018   COPD with chronic bronchitis 12/08/2017   Dyspnea 10/01/2016   Abnormal PFTs 10/01/2016   Hyperglycemia 03/28/2015   Allergic rhinitis 01/24/2015   Arthritis 01/24/2015   Basal cell carcinoma-1990 01/24/2015   BPH (benign prostatic hyperplasia) 01/24/2015   BMI 29.0-29.9,adult 01/24/2015   H/O renal calculi 01/24/2015   Headache, migraine 01/24/2015   AF (paroxysmal atrial fibrillation) (HCC) 01/24/2015   Spinal stenosis 01/24/2015   SPL (spondylolisthesis) 01/24/2015   Benign paroxysmal positional nystagmus 01/24/2015   Encounter for therapeutic drug monitoring 11/02/2013   Hyperlipidemia 05/03/2009   MIGRAINE HEADACHE 05/03/2009   Essential hypertension 05/03/2009   CAD in native artery 05/03/2009   KNEE  PAIN 05/03/2009   Backache 05/03/2009   History of tobacco use 05/03/2009    PCP: Jeani Sow, MD  REFERRING PROVIDER: Bjorn Pippin, MD   REFERRING DIAG: S/P Right Reverse Total Shoulder Replacement-DOS 03/26/23   THERAPY DIAG:  Stiffness of right shoulder, not elsewhere classified  Acute pain of right shoulder  Rationale for Evaluation and Treatment: Rehabilitation  ONSET DATE: 03/26/23  SUBJECTIVE:                                                                                                                                                                                       SUBJECTIVE STATEMENT:  No new complaints.  PERTINENT HISTORY: Hypertension, atrial fibrillation, COPD, arthritis, allergies, history of cancer, and congestive heart failure  PAIN:  Are you having pain? Yes: NPRS scale: 2/10 Pain location: right shoulder and bicep Pain description: constant sore and ache Aggravating factors: none known Relieving factors: ice and medication  PRECAUTIONS: Shoulder  RED FLAGS: None   WEIGHT BEARING RESTRICTIONS: No  FALLS:  Has patient fallen in last 6 months?  No, but he has had balance problems since he had chemotherapy approximately 3 years ago  LIVING ENVIRONMENT: Lives with: lives with their spouse Lives in: House/apartment Has following equipment at home: Single point cane  OCCUPATION: Retired  PLOF: Independent  PATIENT GOALS:improved use of his right shoulder (driving, getting dressed)  NEXT MD VISIT: 04/10/23  OBJECTIVE: all objective measures were assessed at his initial evaluation on 03/30/23 unless otherwise noted  PATIENT SURVEYS:  FOTO 11.54  COGNITION: Overall cognitive status: Within functional limits for tasks assessed     SENSATION: Patient reports that he has some numbness in his left hand from chemotherapy.   UPPER EXTREMITY ROM:   Active ROM Right eval Left eval  Shoulder flexion 30 115  Shoulder extension    Shoulder abduction 30 102  Shoulder adduction    Shoulder internal rotation  To L3  Shoulder external rotation -15 To T2  Elbow flexion    Elbow extension    Wrist flexion    Wrist extension    Wrist ulnar deviation    Wrist radial deviation    Wrist pronation    Wrist supination    (Blank rows = not tested)  UPPER EXTREMITY MMT: not tested due due to surgical condition  MMT Right eval Left eval  Shoulder flexion    Shoulder extension    Shoulder abduction    Shoulder adduction    Shoulder internal rotation    Shoulder external rotation    Middle trapezius    Lower trapezius     Elbow flexion    Elbow  extension    Wrist flexion    Wrist extension    Wrist ulnar deviation    Wrist radial deviation    Wrist pronation    Wrist supination    Grip strength (lbs)    (Blank rows = not tested)  PALPATION:  TTP: right deltoid, pectoralis major and minor, and along incision   TODAY'S TREATMENT:                                                                                                                                         DATE:                  04/24/23:                                   EXERCISE LOG  Exercise Repetitions and Resistance Comments  Pulleys 6 mins   Ranger Flex/ext, CW, CCW circles x 2 mins each   3-way Bicep Curls 1# x 20 reps each            Blank cell = exercise not performed today   Manual Therapy Soft Tissue Mobilization: Right Shoulder, STW/M to right deltoid and bicep to decrease    Modalities  Date:  Vaso: Shoulder, 34 degrees; low pressure, 15 mins, Pain and Edema   PATIENT EDUCATION: Education details: See below Person educated: Patient Education method: Programmer, multimedia, demo Education comprehension: verbalized understanding, handout  HOME EXERCISE PROGRAM: HOME EXERCISE PROGRAM Created by [place] Aug 2nd, 2024 View at [email or web address] using code: [email or web address] Total 1 Page 1 of 1 WAND EXTERNAL ROTATION - SUPINE ER Lie on your back holding a cane or wand with both hands.  On the affected side, place a small rolled up towel or pillow under your elbow. Maintain approx. 90 degree bend at the elbow with your arm approximately 30-45 degrees away from your side. GENTLE. PAIN-FREE Use your other arm to pull the wand/cane to rotate the affected arm back into a stretch. Hold and then return to starting position and then repeat. Repeat 10 Times Hold 15 Seconds Complete 1 Set Perform 4 Times a Day  ASSESSMENT:  CLINICAL IMPRESSION: Pt arrives for today's treatment session reporting 2/10 right shoulder pain.  Pt reports  weaning himself out of sling while at home.  Mild increase in pain when not wearing brace.  Pt reports having an MD appointment coming up, but not sure of the day.  Normal responses to vaso noted upon removal.  Pt reported minimal pain at completion of today's treatment session.    OBJECTIVE IMPAIRMENTS: decreased activity tolerance, decreased knowledge of condition, decreased mobility, decreased ROM, decreased strength, hypomobility, increased edema, impaired tone, impaired UE functional use, and pain.   ACTIVITY LIMITATIONS: carrying, lifting, bathing, dressing, hygiene/grooming, and locomotion level  PARTICIPATION LIMITATIONS: meal prep, cleaning, laundry, shopping,  and community activity  PERSONAL FACTORS: Age, Time since onset of injury/illness/exacerbation, and 3+ comorbidities: Hypertension, atrial fibrillation, COPD, arthritis, allergies, history of cancer, and congestive heart failure  are also affecting patient's functional outcome.   REHAB POTENTIAL: Good  CLINICAL DECISION MAKING: Evolving/moderate complexity  EVALUATION COMPLEXITY: Moderate   GOALS: Goals reviewed with patient? Yes  SHORT TERM GOALS: Target date: 04/20/23  Patient will be independent with his initial HEP.  Baseline: Goal status: INITIAL  2.  Patient will be able to achieve at least 70 degrees of active shoulder flexion for improved function reaching.  Baseline:  Goal status: INITIAL  3.  Patient will be able to demonstrate at least 70 degrees of active right shoulder abduction for improved shoulder mobility.  Baseline:  Goal status: INITIAL  4.  Patient will reported being able to don and doff his clothing with minimal to no difficulty for improved independence.  Baseline:  Goal status: INITIAL  LONG TERM GOALS: Target date: 05/11/23  Patient will be independent with his advanced HEP.  Baseline:  Goal status: INITIAL  2.  Patient will be able to demonstrate at least 100 degrees of right shoulder  flexion for improved function reaching overhead.  Baseline:  Goal status: INITIAL  3.  Patient will be able to demonstrate at least 100 degrees of active right shoulder abduction for improved function reaching.  Baseline:  Goal status: INITIAL  4.  Patient will be able to carry at least 5 pounds with his right upper extremity for improved function with his household activities.  Baseline:  Goal status: INITIAL  PLAN:  PT FREQUENCY: 2x/week  PT DURATION: 6 weeks  PLANNED INTERVENTIONS: Therapeutic exercises, Therapeutic activity, Neuromuscular re-education, Patient/Family education, Self Care, Joint mobilization, Electrical stimulation, Cryotherapy, Moist heat, Vasopneumatic device, Manual therapy, and Re-evaluation  PLAN FOR NEXT SESSION: Pulleys, isometrics, PROM, pendulums, and modalities as needed    Newman P[place], PTA 04/24/2023, 12:31 PM

## 2023-04-27 ENCOUNTER — Ambulatory Visit: Payer: Medicare Other

## 2023-04-27 DIAGNOSIS — M25511 Pain in right shoulder: Secondary | ICD-10-CM | POA: Diagnosis not present

## 2023-04-27 DIAGNOSIS — M25611 Stiffness of right shoulder, not elsewhere classified: Secondary | ICD-10-CM

## 2023-04-27 NOTE — Therapy (Signed)
OUTPATIENT PHYSICAL THERAPY SHOULDER TREATMENT   Patient Name: Larry Bautista MRN: [id number] DOB:Jun 30, 1941, 82 y.o., male Today's Date: 04/27/2023  END OF SESSION:  PT End of Session - 04/27/23 1348     Visit Number 8    Number of Visits 12    Date for PT Re-Evaluation 06/05/23    PT Start Time 1345    PT Stop Time 1430    PT Time Calculation (min) 45 min    Activity Tolerance Patient tolerated treatment well    Behavior During Therapy [facility] for tasks assessed/performed               Past Medical History:  Diagnosis Date   Allergy    Arthritis    Blood transfusion without reported diagnosis    Cancer (HCC)    esoph-chemo and rad   CHF (congestive heart failure) (HCC)    Chronic knee pain    Coronary artery disease    with LAD DE stent 2004   Dysplastic nevus 03/26/2016   right mid med calf   Dysrhythmia    Heel spur, left    Hyperlipidemia    Hypertension    Kidney stones    Lower back pain    status post surgery for spondylolisthesis   Migraine    Persistent atrial fibrillation (HCC)    longstanding persistent (since 05/2008)   Plantar fasciitis    Past Surgical History:  Procedure Laterality Date   BACK SURGERY     spondylolisthesis   CARDIAC CATHETERIZATION  03/06/2003   CORONARY ANGIOPLASTY WITH STENT PLACEMENT  04/09/2003   CYPHER stent implantation in the LAD   ESOPHAGOGASTRODUODENOSCOPY (EGD) WITH PROPOFOL N/A 01/07/2018   Procedure: ESOPHAGOGASTRODUODENOSCOPY (EGD) WITH PROPOFOL;  Surgeon: Graylin Shiver, MD;  Location: [facility] ENDOSCOPY;  Service: Endoscopy;  Laterality: N/A;   HAND SURGERY Right 09/08/2012   carpal tunnel   HERNIA REPAIR     INGUINAL HERNIA REPAIR Right 04/26/2020   Procedure: RIGHT INGUINAL HERNIA REPAIR WITH MESH;  Surgeon: Abigail Miyamoto, MD;  Location: [facility];  Service: General;  Laterality: Right;   REVERSE SHOULDER ARTHROPLASTY Right 03/26/2023   Procedure: REVERSE SHOULDER ARTHROPLASTY;  Surgeon:  Bjorn Pippin, MD;  Location: [facility];  Service: Orthopedics;  Laterality: Right;   SKIN CANCER EXCISION  05/10/1979   Face   SPINE SURGERY  2000   VASCULAR SURGERY     Vascular Stent   Patient Active Problem List   Diagnosis Date Noted   Gait disorder 01/14/2023   Hematoma of left knee region 08/22/2019   Knee pain 08/21/2019   Osteoarthritis of right shoulder 10/15/2018   Pulmonary nodule 09/30/2018   Adenocarcinoma of gastroesophageal junction (HCC) 01/14/2018   MRSA nasal colonization 01/12/2018   Acute GI bleeding 01/07/2018   Acute blood loss anemia 01/07/2018   COPD with chronic bronchitis 12/08/2017   Dyspnea 10/01/2016   Abnormal PFTs 10/01/2016   Hyperglycemia 03/28/2015   Allergic rhinitis 01/24/2015   Arthritis 01/24/2015   Basal cell carcinoma-1990 01/24/2015   BPH (benign prostatic hyperplasia) 01/24/2015   BMI 29.0-29.9,adult 01/24/2015   H/O renal calculi 01/24/2015   Headache, migraine 01/24/2015   AF (paroxysmal atrial fibrillation) (HCC) 01/24/2015   Spinal stenosis 01/24/2015   SPL (spondylolisthesis) 01/24/2015   Benign paroxysmal positional nystagmus 01/24/2015   Encounter for therapeutic drug monitoring 11/02/2013   Hyperlipidemia 05/03/2009   MIGRAINE HEADACHE 05/03/2009   Essential hypertension 05/03/2009   CAD in native artery 05/03/2009  KNEE PAIN 05/03/2009   Backache 05/03/2009   History of tobacco use 05/03/2009    PCP: Jeani Sow, MD  REFERRING PROVIDER: Bjorn Pippin, MD   REFERRING DIAG: S/P Right Reverse Total Shoulder Replacement-DOS 03/26/23   THERAPY DIAG:  Stiffness of right shoulder, not elsewhere classified  Acute pain of right shoulder  Rationale for Evaluation and Treatment: Rehabilitation  ONSET DATE: 03/26/23  SUBJECTIVE:                                                                                                                                                                                       SUBJECTIVE STATEMENT: Patient reports that his shoulder is very "tense" today.   PERTINENT HISTORY: Hypertension, atrial fibrillation, COPD, arthritis, allergies, history of cancer, and congestive heart failure  PAIN:  Are you having pain? Yes: NPRS scale: 1-2/10 Pain location: right shoulder and bicep Pain description: constant sore and ache Aggravating factors: none known Relieving factors: ice and medication  PRECAUTIONS: Shoulder  RED FLAGS: None   WEIGHT BEARING RESTRICTIONS: No  FALLS:  Has patient fallen in last 6 months?  No, but he has had balance problems since he had chemotherapy approximately 3 years ago  LIVING ENVIRONMENT: Lives with: lives with their spouse Lives in: House/apartment Has following equipment at home: Single point cane  OCCUPATION: Retired  PLOF: Independent  PATIENT GOALS:improved use of his right shoulder (driving, getting dressed)  NEXT MD VISIT: September 2024  OBJECTIVE: all objective measures were assessed at his initial evaluation on 03/30/23 unless otherwise noted  PATIENT SURVEYS:  FOTO 11.54  COGNITION: Overall cognitive status: Within functional limits for tasks assessed     SENSATION: Patient reports that he has some numbness in his left hand from chemotherapy.   UPPER EXTREMITY ROM:   Active ROM Right eval Right 04/27/23 Left eval  Shoulder flexion 30 93 115  Shoulder extension     Shoulder abduction 30 90 102  Shoulder adduction     Shoulder internal rotation   To L3  Shoulder external rotation -15 31 To T2  Elbow flexion     Elbow extension     Wrist flexion     Wrist extension     Wrist ulnar deviation     Wrist radial deviation     Wrist pronation     Wrist supination     (Blank rows = not tested)  UPPER EXTREMITY MMT: not tested due due to surgical condition  PALPATION:  TTP: right deltoid, pectoralis major and minor, and along incision   TODAY'S TREATMENT:  DATE:                                    04/27/23 EXERCISE LOG  Exercise Repetitions and Resistance Comments  Pulleys 6 minutes  Flexion   Wall ladder  10 reps   Max # 19  Resisted row  Green t-band x 3 minutes  Seated   3-way bicep curls  1# x 20 reps each  RUE only   Isometric ball press  2 minutes w/ 1 second hold    Ball roll out  3 minutes  Flexion   R Shoulder ADD isometric  2 minutes w/ 3 second hold   Shoulder IR isometric  10 reps w/ 3 second hold     Blank cell = exercise not performed today                                                  04/24/23 EXERCISE LOG  Exercise Repetitions and Resistance Comments  Pulleys 6 mins   Ranger Flex/ext, CW, CCW circles x 2 mins each   3-way Bicep Curls 1# x 20 reps each            Blank cell = exercise not performed today   Manual Therapy Soft Tissue Mobilization: Right Shoulder, STW/M to right deltoid and bicep to decrease    Modalities  Date:  Vaso: Shoulder, 34 degrees; low pressure, 15 mins, Pain and Edema   PATIENT EDUCATION: Education details: prognosis, healing, wearing the sling, and safety Person educated: Patient Education method: Explanation, demo Education comprehension: verbalized understanding,   HOME EXERCISE PROGRAM: HOME EXERCISE PROGRAM Created by [place] Applegate Aug 2nd, 2024 View at [email or web address] using code: [email or web address] Total 1 Page 1 of 1 WAND EXTERNAL ROTATION - SUPINE ER Lie on your back holding a cane or wand with both hands.  On the affected side, place a small rolled up towel or pillow under your elbow. Maintain approx. 90 degree bend at the elbow with your arm approximately 30-45 degrees away from your side. GENTLE. PAIN-FREE Use your other arm to pull the wand/cane to rotate the affected arm back into a stretch. Hold and then return to starting position and then repeat. Repeat 10 Times Hold  15 Seconds Complete 1 Set Perform 4 Times a Day  ASSESSMENT:  CLINICAL IMPRESSION: Patient was progressed with multiple new and familiar interventions for improved shoulder mobility. He required significant education on today's interventions and activities he can perform at home without increasing his risk of injury or dislocation. He reported understanding. He experienced no significant increase in pain or discomfort with any of today's interventions. He reported feeling "alright" upon the conclusion of treatment. He continues to require skilled physical therapy to address his remaining impairments to return to his prior level of function.     OBJECTIVE IMPAIRMENTS: decreased activity tolerance, decreased knowledge of condition, decreased mobility, decreased ROM, decreased strength, hypomobility, increased edema, impaired tone, impaired UE functional use, and pain.   ACTIVITY LIMITATIONS: carrying, lifting, bathing, dressing, hygiene/grooming, and locomotion level  PARTICIPATION LIMITATIONS: meal prep, cleaning, laundry, shopping, and community activity  PERSONAL FACTORS: Age, Time since onset of injury/illness/exacerbation, and 3+ comorbidities: Hypertension, atrial fibrillation, COPD, arthritis, allergies, history of cancer, and congestive heart failure  are also affecting patient's functional  outcome.   REHAB POTENTIAL: Good  CLINICAL DECISION MAKING: Evolving/moderate complexity  EVALUATION COMPLEXITY: Moderate   GOALS: Goals reviewed with patient? Yes  SHORT TERM GOALS: Target date: 04/20/23  Patient will be independent with his initial HEP.  Baseline: Goal status: INITIAL  2.  Patient will be able to achieve at least 70 degrees of active shoulder flexion for improved function reaching.  Baseline:  Goal status: MET  3.  Patient will be able to demonstrate at least 70 degrees of active right shoulder abduction for improved shoulder mobility.  Baseline:  Goal status:  MET  4.  Patient will reported being able to don and doff his clothing with minimal to no difficulty for improved independence.  Baseline:  Goal status: INITIAL  LONG TERM GOALS: Target date: 05/11/23  Patient will be independent with his advanced HEP.  Baseline:  Goal status: IN PROGRESS  2.  Patient will be able to demonstrate at least 100 degrees of right shoulder flexion for improved function reaching overhead.  Baseline:  Goal status: IN PROGRESS  3.  Patient will be able to demonstrate at least 100 degrees of active right shoulder abduction for improved function reaching.  Baseline:  Goal status: IN PROGRESS  4.  Patient will be able to carry at least 5 pounds with his right upper extremity for improved function with his household activities.  Baseline:  Goal status: IN PROGRESS  PLAN:  PT FREQUENCY: 2x/week  PT DURATION: 6 weeks  PLANNED INTERVENTIONS: Therapeutic exercises, Therapeutic activity, Neuromuscular re-education, Patient/Family education, Self Care, Joint mobilization, Electrical stimulation, Cryotherapy, Moist heat, Vasopneumatic device, Manual therapy, and Re-evaluation  PLAN FOR NEXT SESSION: Pulleys, isometrics, PROM, pendulums, and modalities as needed    Granville Lewis, PT 04/27/2023, 4:41 PM

## 2023-04-30 ENCOUNTER — Ambulatory Visit: Payer: Medicare Other | Admitting: Physical Therapy

## 2023-04-30 DIAGNOSIS — M25611 Stiffness of right shoulder, not elsewhere classified: Secondary | ICD-10-CM | POA: Diagnosis not present

## 2023-04-30 DIAGNOSIS — M25511 Pain in right shoulder: Secondary | ICD-10-CM | POA: Diagnosis not present

## 2023-04-30 NOTE — Therapy (Signed)
OUTPATIENT PHYSICAL THERAPY SHOULDER TREATMENT   Patient Name: Larry Bautista MRN: [id number] DOB:Nov 27, 1940, 82 y.o., male Today's Date: 04/30/2023  END OF SESSION:  PT End of Session - 04/30/23 1435     Visit Number 9    Number of Visits 12    Date for PT Re-Evaluation 06/05/23    PT Start Time 0145    PT Stop Time 0232    PT Time Calculation (min) 47 min    Activity Tolerance Patient tolerated treatment well    Behavior During Therapy [facility] for tasks assessed/performed               Past Medical History:  Diagnosis Date   Allergy    Arthritis    Blood transfusion without reported diagnosis    Cancer (HCC)    esoph-chemo and rad   CHF (congestive heart failure) (HCC)    Chronic knee pain    Coronary artery disease    with LAD DE stent 2004   Dysplastic nevus 03/26/2016   right mid med calf   Dysrhythmia    Heel spur, left    Hyperlipidemia    Hypertension    Kidney stones    Lower back pain    status post surgery for spondylolisthesis   Migraine    Persistent atrial fibrillation (HCC)    longstanding persistent (since 05/2008)   Plantar fasciitis    Past Surgical History:  Procedure Laterality Date   BACK SURGERY     spondylolisthesis   CARDIAC CATHETERIZATION  03/06/2003   CORONARY ANGIOPLASTY WITH STENT PLACEMENT  04/09/2003   CYPHER stent implantation in the LAD   ESOPHAGOGASTRODUODENOSCOPY (EGD) WITH PROPOFOL N/A 01/07/2018   Procedure: ESOPHAGOGASTRODUODENOSCOPY (EGD) WITH PROPOFOL;  Surgeon: Graylin Shiver, MD;  Location: [facility] ENDOSCOPY;  Service: Endoscopy;  Laterality: N/A;   HAND SURGERY Right 09/08/2012   carpal tunnel   HERNIA REPAIR     INGUINAL HERNIA REPAIR Right 04/26/2020   Procedure: RIGHT INGUINAL HERNIA REPAIR WITH MESH;  Surgeon: Abigail Miyamoto, MD;  Location: [facility];  Service: General;  Laterality: Right;   REVERSE SHOULDER ARTHROPLASTY Right 03/26/2023   Procedure: REVERSE SHOULDER ARTHROPLASTY;  Surgeon:  Bjorn Pippin, MD;  Location: [facility];  Service: Orthopedics;  Laterality: Right;   SKIN CANCER EXCISION  05/10/1979   Face   SPINE SURGERY  2000   VASCULAR SURGERY     Vascular Stent   Patient Active Problem List   Diagnosis Date Noted   Gait disorder 01/14/2023   Hematoma of left knee region 08/22/2019   Knee pain 08/21/2019   Osteoarthritis of right shoulder 10/15/2018   Pulmonary nodule 09/30/2018   Adenocarcinoma of gastroesophageal junction (HCC) 01/14/2018   MRSA nasal colonization 01/12/2018   Acute GI bleeding 01/07/2018   Acute blood loss anemia 01/07/2018   COPD with chronic bronchitis 12/08/2017   Dyspnea 10/01/2016   Abnormal PFTs 10/01/2016   Hyperglycemia 03/28/2015   Allergic rhinitis 01/24/2015   Arthritis 01/24/2015   Basal cell carcinoma-1990 01/24/2015   BPH (benign prostatic hyperplasia) 01/24/2015   BMI 29.0-29.9,adult 01/24/2015   H/O renal calculi 01/24/2015   Headache, migraine 01/24/2015   AF (paroxysmal atrial fibrillation) (HCC) 01/24/2015   Spinal stenosis 01/24/2015   SPL (spondylolisthesis) 01/24/2015   Benign paroxysmal positional nystagmus 01/24/2015   Encounter for therapeutic drug monitoring 11/02/2013   Hyperlipidemia 05/03/2009   MIGRAINE HEADACHE 05/03/2009   Essential hypertension 05/03/2009   CAD in native artery 05/03/2009  KNEE PAIN 05/03/2009   Backache 05/03/2009   History of tobacco use 05/03/2009    PCP: Jeani Sow, MD  REFERRING PROVIDER: Bjorn Pippin, MD   REFERRING DIAG: S/P Right Reverse Total Shoulder Replacement-DOS 03/26/23   THERAPY DIAG:  Stiffness of right shoulder, not elsewhere classified  Acute pain of right shoulder  Rationale for Evaluation and Treatment: Rehabilitation  ONSET DATE: 03/26/23  SUBJECTIVE:                                                                                                                                                                                       SUBJECTIVE STATEMENT: Sore but doing good.  PERTINENT HISTORY: Hypertension, atrial fibrillation, COPD, arthritis, allergies, history of cancer, and congestive heart failure  PAIN:  Are you having pain? Yes: NPRS scale: 1-2/10 Pain location: right shoulder and bicep Pain description: constant sore and ache Aggravating factors: none known Relieving factors: ice and medication  PRECAUTIONS: Shoulder  RED FLAGS: None   WEIGHT BEARING RESTRICTIONS: No  FALLS:  Has patient fallen in last 6 months?  No, but he has had balance problems since he had chemotherapy approximately 3 years ago  LIVING ENVIRONMENT: Lives with: lives with their spouse Lives in: House/apartment Has following equipment at home: Single point cane  OCCUPATION: Retired  PLOF: Independent  PATIENT GOALS:improved use of his right shoulder (driving, getting dressed)  NEXT MD VISIT: September 2024  OBJECTIVE: all objective measures were assessed at his initial evaluation on 03/30/23 unless otherwise noted  PATIENT SURVEYS:  FOTO 11.54  COGNITION: Overall cognitive status: Within functional limits for tasks assessed     SENSATION: Patient reports that he has some numbness in his left hand from chemotherapy.   UPPER EXTREMITY ROM:   Active ROM Right eval Right 04/27/23 Left eval  Shoulder flexion 30 93 115  Shoulder extension     Shoulder abduction 30 90 102  Shoulder adduction     Shoulder internal rotation   To L3  Shoulder external rotation -15 31 To T2  Elbow flexion     Elbow extension     Wrist flexion     Wrist extension     Wrist ulnar deviation     Wrist radial deviation     Wrist pronation     Wrist supination     (Blank rows = not tested)  UPPER EXTREMITY MMT: not tested due due to surgical condition  PALPATION:  TTP: right deltoid, pectoralis major and minor, and along incision   TODAY'S TREATMENT:  DATE:                                    04/30/23 EXERCISE LOG  Exercise Repetitions and Resistance Comments  Pulleys 5 minutes    Wall ladder (assisted) 3 minutes   Seated UE Ranger  5 minutes.       In supine:  Gentle PROM x 10 minutes into flexion and ER per protocol f/b Vasopneumatic x 15 minutes to patient's right shoulder with pillow between elbow and thorax.    PATIENT EDUCATION: Education details: prognosis, healing, wearing the sling, and safety Person educated: Patient Education method: Explanation, demo Education comprehension: verbalized understanding,   HOME EXERCISE PROGRAM: HOME EXERCISE PROGRAM Created by [place] Danielys Madry Aug 2nd, 2024 View at [email or web address] using code: [email or web address] Total 1 Page 1 of 1 WAND EXTERNAL ROTATION - SUPINE ER Lie on your back holding a cane or wand with both hands.  On the affected side, place a small rolled up towel or pillow under your elbow. Maintain approx. 90 degree bend at the elbow with your arm approximately 30-45 degrees away from your side. GENTLE. PAIN-FREE Use your other arm to pull the wand/cane to rotate the affected arm back into a stretch. Hold and then return to starting position and then repeat. Repeat 10 Times Hold 15 Seconds Complete 1 Set Perform 4 Times a Day  ASSESSMENT:  CLINICAL IMPRESSION: Patient progressing well.  Gentle right shoulder passive flexion to 108 degrees and ER to 21 degrees.  OBJECTIVE IMPAIRMENTS: decreased activity tolerance, decreased knowledge of condition, decreased mobility, decreased ROM, decreased strength, hypomobility, increased edema, impaired tone, impaired UE functional use, and pain.   ACTIVITY LIMITATIONS: carrying, lifting, bathing, dressing, hygiene/grooming, and locomotion level  PARTICIPATION LIMITATIONS: meal prep, cleaning, laundry, shopping, and community activity  PERSONAL FACTORS: Age, Time since onset  of injury/illness/exacerbation, and 3+ comorbidities: Hypertension, atrial fibrillation, COPD, arthritis, allergies, history of cancer, and congestive heart failure  are also affecting patient's functional outcome.   REHAB POTENTIAL: Good  CLINICAL DECISION MAKING: Evolving/moderate complexity  EVALUATION COMPLEXITY: Moderate   GOALS: Goals reviewed with patient? Yes  SHORT TERM GOALS: Target date: 04/20/23  Patient will be independent with his initial HEP.  Baseline: Goal status: INITIAL  2.  Patient will be able to achieve at least 70 degrees of active shoulder flexion for improved function reaching.  Baseline:  Goal status: MET  3.  Patient will be able to demonstrate at least 70 degrees of active right shoulder abduction for improved shoulder mobility.  Baseline:  Goal status: MET  4.  Patient will reported being able to don and doff his clothing with minimal to no difficulty for improved independence.  Baseline:  Goal status: INITIAL  LONG TERM GOALS: Target date: 05/11/23  Patient will be independent with his advanced HEP.  Baseline:  Goal status: IN PROGRESS  2.  Patient will be able to demonstrate at least 100 degrees of right shoulder flexion for improved function reaching overhead.  Baseline:  Goal status: IN PROGRESS  3.  Patient will be able to demonstrate at least 100 degrees of active right shoulder abduction for improved function reaching.  Baseline:  Goal status: IN PROGRESS  4.  Patient will be able to carry at least 5 pounds with his right upper extremity for improved function with his household activities.  Baseline:  Goal status: IN PROGRESS  PLAN:  PT FREQUENCY: 2x/week  PT  DURATION: 6 weeks  PLANNED INTERVENTIONS: Therapeutic exercises, Therapeutic activity, Neuromuscular re-education, Patient/Family education, Self Care, Joint mobilization, Electrical stimulation, Cryotherapy, Moist heat, Vasopneumatic device, Manual therapy, and  Re-evaluation  PLAN FOR NEXT SESSION: Pulleys, isometrics, PROM, pendulums, and modalities as needed    Moriah Loughry, [place], PT 04/30/2023, 3:11 PM

## 2023-05-05 ENCOUNTER — Ambulatory Visit: Payer: Medicare Other | Admitting: Physical Therapy

## 2023-05-05 NOTE — Therapy (Signed)
[facility] Outpatient Rehabilitation at [facility] [address] [place], [place], [address] Phone: [phone]   Fax:  [phone]  Patient Details  Name: Larry Bautista MRN: [id number] Date of Birth: 04/23/1941 Referring Provider:  Ramond Marrow MD  Encounter Date: 05/05/2023  The patient reports two days ago he was walking down stairs in his home with low lighting and missed the last step and fell.  He grabbed a railing to try to break is fall and ended up on his back.  His wife was eventually able to help him up.  The next day, Monday, 05/04/23, he was trying to get in the passenger side of his truck.  He was grabbing the grab bar near the glove box and went to put his foot on the step.  His foot slipped and he lost his grip on the grab bar and fell backward and fall on his wife.  He had bandaids on both elbows due to abrasions.  Spoke with patient and wife and advised them to call MD office today to get in for check.     Taylia Berber, [place], PT 05/05/2023, 2:13 PM  [facility] Outpatient Rehabilitation at [facility] [address] [place], [place], [address] Phone: [phone]   Fax:  [phone]

## 2023-05-07 DIAGNOSIS — M19011 Primary osteoarthritis, right shoulder: Secondary | ICD-10-CM | POA: Diagnosis not present

## 2023-05-08 ENCOUNTER — Ambulatory Visit: Payer: Medicare Other | Admitting: *Deleted

## 2023-05-08 DIAGNOSIS — M25511 Pain in right shoulder: Secondary | ICD-10-CM

## 2023-05-08 DIAGNOSIS — M25611 Stiffness of right shoulder, not elsewhere classified: Secondary | ICD-10-CM | POA: Diagnosis not present

## 2023-05-08 NOTE — Therapy (Signed)
[facility] PH[facility]ERAPY SHOULDER TREATMENT   Patient Name: Larry Bautista MRN: [id number] DOB:1940/11/28, 82 y.o., male Today's Date: 05/08/2023  END OF SESSION:  PT End of Session - 05/08/23 1147     Visit Number 10    Number of Visits 12    Date for PT Re-Evaluation 06/05/23    PT Start Time 1145    PT Stop Time 1235    PT Time Calculation (min) 50 min               Past Medical History:  Diagnosis Date   Allergy    Arthritis    Blood transfusion without reported diagnosis    Cancer (HCC)    esoph-chemo and rad   CHF (congestive heart failure) (HCC)    Chronic knee pain    Coronary artery disease    with LAD DE stent 2004   Dysplastic nevus 03/26/2016   right mid med calf   Dysrhythmia    Heel spur, left    Hyperlipidemia    Hypertension    Kidney stones    Lower back pain    status post surgery for spondylolisthesis   Migraine    Persistent atrial fibrillation (HCC)    longstanding persistent (since 05/2008)   Plantar fasciitis    Past Surgical History:  Procedure Laterality Date   BACK SURGERY     spondylolisthesis   CARDIAC CATHETERIZATION  03/06/2003   CORONARY ANGIOPLASTY WITH STENT PLACEMENT  04/09/2003   CYPHER stent implantation in the LAD   ESOPHAGOGASTRODUODENOSCOPY (EGD) WITH PROPOFOL N/A 01/07/2018   Procedure: ESOPHAGOGASTRODUODENOSCOPY (EGD) WITH PROPOFOL;  Surgeon: Graylin Shiver, MD;  Location: [facility] ENDOSCOPY;  Service: Endoscopy;  Laterality: N/A;   HAND SURGERY Right 09/08/2012   carpal tunnel   HERNIA REPAIR     INGUINAL HERNIA REPAIR Right 04/26/2020   Procedure: RIGHT INGUINAL HERNIA REPAIR WITH MESH;  Surgeon: Abigail Miyamoto, MD;  Location: [facility];  Service: General;  Laterality: Right;   REVERSE SHOULDER ARTHROPLASTY Right 03/26/2023   Procedure: REVERSE SHOULDER ARTHROPLASTY;  Surgeon: Bjorn Pippin, MD;  Location: [facility];  Service: Orthopedics;  Laterality: Right;   SKIN  CANCER EXCISION  05/10/1979   Face   SPINE SURGERY  2000   VASCULAR SURGERY     Vascular Stent   Patient Active Problem List   Diagnosis Date Noted   Gait disorder 01/14/2023   Hematoma of left knee region 08/22/2019   Knee pain 08/21/2019   Osteoarthritis of right shoulder 10/15/2018   Pulmonary nodule 09/30/2018   Adenocarcinoma of gastroesophageal junction (HCC) 01/14/2018   MRSA nasal colonization 01/12/2018   Acute GI bleeding 01/07/2018   Acute blood loss anemia 01/07/2018   COPD with chronic bronchitis 12/08/2017   Dyspnea 10/01/2016   Abnormal PFTs 10/01/2016   Hyperglycemia 03/28/2015   Allergic rhinitis 01/24/2015   Arthritis 01/24/2015   Basal cell carcinoma-1990 01/24/2015   BPH (benign prostatic hyperplasia) 01/24/2015   BMI 29.0-29.9,adult 01/24/2015   H/O renal calculi 01/24/2015   Headache, migraine 01/24/2015   AF (paroxysmal atrial fibrillation) (HCC) 01/24/2015   Spinal stenosis 01/24/2015   SPL (spondylolisthesis) 01/24/2015   Benign paroxysmal positional nystagmus 01/24/2015   Encounter for therapeutic drug monitoring 11/02/2013   Hyperlipidemia 05/03/2009   MIGRAINE HEADACHE 05/03/2009   Essential hypertension 05/03/2009   CAD in native artery 05/03/2009   KNEE PAIN 05/03/2009   Backache 05/03/2009   History of tobacco use 05/03/2009  PCP: Jeani Sow, MD  REFERRING PROVIDER: Bjorn Pippin, MD   REFERRING DIAG: S/P Right Reverse Total Shoulder Replacement-DOS 03/26/23   THERAPY DIAG:  Stiffness of right shoulder, not elsewhere classified  Acute pain of right shoulder  Rationale for Evaluation and Treatment: Rehabilitation  ONSET DATE: 03/26/23  SUBJECTIVE:                                                                                                                                                                                      SUBJECTIVE STATEMENT: Sore but doing good. MD said it was okay to come to therapy. 3-4/10 X-ray  was normal  PERTINENT HISTORY: Hypertension, atrial fibrillation, COPD, arthritis, allergies, history of cancer, and congestive heart failure  PAIN:  Are you having pain? Yes: NPRS scale: 3-4/10 Pain location: right shoulder and bicep Pain description: constant sore and ache Aggravating factors: none known Relieving factors: ice and medication  PRECAUTIONS: Shoulder  RED FLAGS: None   WEIGHT BEARING RESTRICTIONS: No  FALLS:  Has patient fallen in last 6 months?  No, but he has had balance problems since he had chemotherapy approximately 3 years ago  LIVING ENVIRONMENT: Lives with: lives with their spouse Lives in: House/apartment Has following equipment at home: Single point cane  OCCUPATION: Retired  PLOF: Independent  PATIENT GOALS:improved use of his right shoulder (driving, getting dressed)  NEXT MD VISIT: September 2024  OBJECTIVE: all objective measures were assessed at his initial evaluation on 03/30/23 unless otherwise noted  PATIENT SURVEYS:  FOTO 11.54  COGNITION: Overall cognitive status: Within functional limits for tasks assessed     SENSATION: Patient reports that he has some numbness in his left hand from chemotherapy.   UPPER EXTREMITY ROM:   Active ROM Right eval Right 04/27/23 Left eval  Shoulder flexion 30 93 115  Shoulder extension     Shoulder abduction 30 90 102  Shoulder adduction     Shoulder internal rotation   To L3  Shoulder external rotation -15 31 To T2  Elbow flexion     Elbow extension     Wrist flexion     Wrist extension     Wrist ulnar deviation     Wrist radial deviation     Wrist pronation     Wrist supination     (Blank rows = not tested)  UPPER EXTREMITY MMT: not tested due due to surgical condition  PALPATION:  TTP: right deltoid, pectoralis major and minor, and along incision   TODAY'S TREATMENT:  DATE:                                                                                         05/08/23 EXERCISE LOG  Exercise Repetitions and Resistance Comments  Pulleys 5 minutes    Wall ladder (assisted) 3 minutes #20 max  Seated UE Ranger  5 minutes.       In supine:  Gentle PROM x 12 minutes into flexion and ER per protocol  flexion to 105 degrees and ER to 20 degrees today  Vasopneumatic x 15 minutes to patient's right shoulder with pillow between elbow and thorax.    PATIENT EDUCATION: Education details: prognosis, healing, wearing the sling, and safety Person educated: Patient Education method: Explanation, demo Education comprehension: verbalized understanding,   HOME EXERCISE PROGRAM: HOME EXERCISE PROGRAM Created by [place] Applegate Aug 2nd, 2024 View at [email or web address] using code: [email or web address] Total 1 Page 1 of 1 WAND EXTERNAL ROTATION - SUPINE ER Lie on your back holding a cane or wand with both hands.  On the affected side, place a small rolled up towel or pillow under your elbow. Maintain approx. 90 degree bend at the elbow with your arm approximately 30-45 degrees away from your side. GENTLE. PAIN-FREE Use your other arm to pull the wand/cane to rotate the affected arm back into a stretch. Hold and then return to starting position and then repeat. Repeat 10 Times Hold 15 Seconds Complete 1 Set Perform 4 Times a Day  ASSESSMENT:  CLINICAL IMPRESSION: Patient reports MD office said x-rays were negative and was able to continue with AAROM exs and gentle PROM,  Gentle right shoulder passive flexion to 105 degrees and ER to 20 degrees. Vaso end of session.  OBJECTIVE IMPAIRMENTS: decreased activity tolerance, decreased knowledge of condition, decreased mobility, decreased ROM, decreased strength, hypomobility, increased edema, impaired tone, impaired UE functional use, and pain.   ACTIVITY LIMITATIONS: carrying, lifting, bathing, dressing,  hygiene/grooming, and locomotion level  PARTICIPATION LIMITATIONS: meal prep, cleaning, laundry, shopping, and community activity  PERSONAL FACTORS: Age, Time since onset of injury/illness/exacerbation, and 3+ comorbidities: Hypertension, atrial fibrillation, COPD, arthritis, allergies, history of cancer, and congestive heart failure  are also affecting patient's functional outcome.   REHAB POTENTIAL: Good  CLINICAL DECISION MAKING: Evolving/moderate complexity  EVALUATION COMPLEXITY: Moderate   GOALS: Goals reviewed with patient? Yes  SHORT TERM GOALS: Target date: 04/20/23  Patient will be independent with his initial HEP.  Baseline: Goal status: INITIAL  2.  Patient will be able to achieve at least 70 degrees of active shoulder flexion for improved function reaching.  Baseline:  Goal status: MET  3.  Patient will be able to demonstrate at least 70 degrees of active right shoulder abduction for improved shoulder mobility.  Baseline:  Goal status: MET  4.  Patient will reported being able to don and doff his clothing with minimal to no difficulty for improved independence.  Baseline:  Goal status: INITIAL  LONG TERM GOALS: Target date: 05/11/23  Patient will be independent with his advanced HEP.  Baseline:  Goal status: IN PROGRESS  2.  Patient will be able to demonstrate at least 100 degrees  of right shoulder flexion for improved function reaching overhead.  Baseline:  Goal status: IN PROGRESS  3.  Patient will be able to demonstrate at least 100 degrees of active right shoulder abduction for improved function reaching.  Baseline:  Goal status: IN PROGRESS  4.  Patient will be able to carry at least 5 pounds with his right upper extremity for improved function with his household activities.  Baseline:  Goal status: IN PROGRESS  PLAN:  PT FREQUENCY: 2x/week  PT DURATION: 6 weeks  PLANNED INTERVENTIONS: Therapeutic exercises, Therapeutic activity, Neuromuscular  re-education, Patient/Family education, Self Care, Joint mobilization, Electrical stimulation, Cryotherapy, Moist heat, Vasopneumatic device, Manual therapy, and Re-evaluation  PLAN FOR NEXT SESSION: Pulleys, isometrics, PROM, pendulums, and modalities as needed    Evangelos Paulino,[name], PTA 05/08/2023, 12:54 PM

## 2023-05-12 ENCOUNTER — Ambulatory Visit: Payer: Medicare Other | Attending: Orthopaedic Surgery

## 2023-05-12 DIAGNOSIS — M25611 Stiffness of right shoulder, not elsewhere classified: Secondary | ICD-10-CM | POA: Insufficient documentation

## 2023-05-12 DIAGNOSIS — M25511 Pain in right shoulder: Secondary | ICD-10-CM | POA: Diagnosis not present

## 2023-05-12 NOTE — Therapy (Signed)
[facility]PAT[facility] PHYSICAL THERAPY SHOULDER TREATMENT   Patient Name: Larry Bautista MRN: [id number] DOB:1941/05/26, 82 y.o., male Today's Date: 05/12/2023  END OF SESSION:  PT End of Session - 05/12/23 1348     Visit Number 11    Number of Visits 12    Date for PT Re-Evaluation 06/05/23    PT Start Time 1345    PT Stop Time 1428    PT Time Calculation (min) 43 min    Activity Tolerance Patient tolerated treatment well    Behavior During Therapy [facility] for tasks assessed/performed                Past Medical History:  Diagnosis Date   Allergy    Arthritis    Blood transfusion without reported diagnosis    Cancer (HCC)    esoph-chemo and rad   CHF (congestive heart failure) (HCC)    Chronic knee pain    Coronary artery disease    with LAD DE stent 2004   Dysplastic nevus 03/26/2016   right mid med calf   Dysrhythmia    Heel spur, left    Hyperlipidemia    Hypertension    Kidney stones    Lower back pain    status post surgery for spondylolisthesis   Migraine    Persistent atrial fibrillation (HCC)    longstanding persistent (since 05/2008)   Plantar fasciitis    Past Surgical History:  Procedure Laterality Date   BACK SURGERY     spondylolisthesis   CARDIAC CATHETERIZATION  03/06/2003   CORONARY ANGIOPLASTY WITH STENT PLACEMENT  04/09/2003   CYPHER stent implantation in the LAD   ESOPHAGOGASTRODUODENOSCOPY (EGD) WITH PROPOFOL N/A 01/07/2018   Procedure: ESOPHAGOGASTRODUODENOSCOPY (EGD) WITH PROPOFOL;  Surgeon: Graylin Shiver, MD;  Location: [facility]COPY;  Service: Endoscopy;  Laterality: N/A;   HAND SURGERY Right 09/08/2012   carpal tunnel   HERNIA REPAIR     INGUINAL HERNIA REPAIR Right 04/26/2020   Procedure: RIGHT INGUINAL HERNIA REPAIR WITH MESH;  Surgeon: Abigail Miyamoto, MD;  Location: [facility];  Service: General;  Laterality: Right;   REVERSE SHOULDER ARTHROPLASTY Right 03/26/2023   Procedure: REVERSE SHOULDER ARTHROPLASTY;   Surgeon: Bjorn Pippin, MD;  Location: [facility];  Service: Orthopedics;  Laterality: Right;   SKIN CANCER EXCISION  05/10/1979   Face   SPINE SURGERY  2000   VASCULAR SURGERY     Vascular Stent   Patient Active Problem List   Diagnosis Date Noted   Gait disorder 01/14/2023   Hematoma of left knee region 08/22/2019   Knee pain 08/21/2019   Osteoarthritis of right shoulder 10/15/2018   Pulmonary nodule 09/30/2018   Adenocarcinoma of gastroesophageal junction (HCC) 01/14/2018   MRSA nasal colonization 01/12/2018   Acute GI bleeding 01/07/2018   Acute blood loss anemia 01/07/2018   COPD with chronic bronchitis 12/08/2017   Dyspnea 10/01/2016   Abnormal PFTs 10/01/2016   Hyperglycemia 03/28/2015   Allergic rhinitis 01/24/2015   Arthritis 01/24/2015   Basal cell carcinoma-1990 01/24/2015   BPH (benign prostatic hyperplasia) 01/24/2015   BMI 29.0-29.9,adult 01/24/2015   H/O renal calculi 01/24/2015   Headache, migraine 01/24/2015   AF (paroxysmal atrial fibrillation) (HCC) 01/24/2015   Spinal stenosis 01/24/2015   SPL (spondylolisthesis) 01/24/2015   Benign paroxysmal positional nystagmus 01/24/2015   Encounter for therapeutic drug monitoring 11/02/2013   Hyperlipidemia 05/03/2009   MIGRAINE HEADACHE 05/03/2009   Essential hypertension 05/03/2009   CAD in native artery  05/03/2009   KNEE PAIN 05/03/2009   Backache 05/03/2009   History of tobacco use 05/03/2009    PCP: Jeani Sow, MD  REFERRING PROVIDER: Bjorn Pippin, MD   REFERRING DIAG: S/P Right Reverse Total Shoulder Replacement-DOS 03/26/23   THERAPY DIAG:  Stiffness of right shoulder, not elsewhere classified  Acute pain of right shoulder  Rationale for Evaluation and Treatment: Rehabilitation  ONSET DATE: 03/26/23  SUBJECTIVE:                                                                                                                                                                                       SUBJECTIVE STATEMENT: Patient reports that his shoulder hurts a little when he raises it, but not too bad.   PERTINENT HISTORY: Hypertension, atrial fibrillation, COPD, arthritis, allergies, history of cancer, and congestive heart failure  PAIN:  Are you having pain? Yes: NPRS scale: none provided/10 Pain location: right shoulder and bicep Pain description: constant sore and ache Aggravating factors: none known Relieving factors: ice and medication  PRECAUTIONS: Shoulder  RED FLAGS: None   WEIGHT BEARING RESTRICTIONS: No  FALLS:  Has patient fallen in last 6 months?  No, but he has had balance problems since he had chemotherapy approximately 3 years ago  LIVING ENVIRONMENT: Lives with: lives with their spouse Lives in: House/apartment Has following equipment at home: Single point cane  OCCUPATION: Retired  PLOF: Independent  PATIENT GOALS:improved use of his right shoulder (driving, getting dressed)  NEXT MD VISIT: September 2024  OBJECTIVE: all objective measures were assessed at his initial evaluation on 03/30/23 unless otherwise noted  PATIENT SURVEYS:  FOTO 11.54  COGNITION: Overall cognitive status: Within functional limits for tasks assessed     SENSATION: Patient reports that he has some numbness in his left hand from chemotherapy.   UPPER EXTREMITY ROM:   Active ROM Right eval Right 04/27/23 Left eval  Shoulder flexion 30 93 115  Shoulder extension     Shoulder abduction 30 90 102  Shoulder adduction     Shoulder internal rotation   To L3  Shoulder external rotation -15 31 To T2  Elbow flexion     Elbow extension     Wrist flexion     Wrist extension     Wrist ulnar deviation     Wrist radial deviation     Wrist pronation     Wrist supination     (Blank rows = not tested)  UPPER EXTREMITY MMT: not tested due due to surgical condition  PALPATION:  TTP: right deltoid, pectoralis major and minor, and along  incision   TODAY'S TREATMENT:  DATE:                                    05/12/23 EXERCISE LOG  Exercise Repetitions and Resistance Comments  Pulleys 5 minutes   R shoulder flexion AROM  15 reps   AA ER with cane  2.5 minutes   Seated UE ranger  2 minutes each Flexion and horizontal ABD/ADD  Resisted row  Green t-band x 2 minutes    Resisted pull down  Green t-band x 20 reps    R shoulder ABD AROM  30 reps    AA cane flexion  3 x 7 reps  Seated   R shoulder ADD isometric  1.5 minutes w/ 5 second hold    Blank cell = exercise not performed today  Manual Therapy Soft Tissue Mobilization: right deltoid, for improved soft tissue extensibility Passive ROM: all planes (excluding extension), to tolerance                                                  05/08/23 XERCISE LOG  Exercise Repetitions and Resistance Comments  Pulleys 5 minutes    Wall ladder (assisted) 3 minutes #20 max  Seated UE Ranger  5 minutes.       In supine:  Gentle PROM x 12 minutes into flexion and ER per protocol  flexion to 105 degrees and ER to 20 degrees today  Vasopneumatic x 15 minutes to patient's right shoulder with pillow between elbow and thorax.   PATIENT EDUCATION: Education details: prognosis, healing, wearing the sling, and safety Person educated: Patient Education method: Explanation, demo Education comprehension: verbalized understanding,   HOME EXERCISE PROGRAM: HOME EXERCISE PROGRAM Created by [place] Applegate Aug 2nd, 2024 View at [email or web address] using code: [email or web address] Total 1 Page 1 of 1 WAND EXTERNAL ROTATION - SUPINE ER Lie on your back holding a cane or wand with both hands.  On the affected side, place a small rolled up towel or pillow under your elbow. Maintain approx. 90 degree bend at the elbow with your arm approximately 30-45 degrees away from your  side. GENTLE. PAIN-FREE Use your other arm to pull the wand/cane to rotate the affected arm back into a stretch. Hold and then return to starting position and then repeat. Repeat 10 Times Hold 15 Seconds Complete 1 Set Perform 4 Times a Day  ASSESSMENT:  CLINICAL IMPRESSION: Patient was progressed with multiple new and familiar interventions for improved shoulder mobility. He required minimal cueing with resisted pull downs to maintain elbow extension needed to isolate latissimus dorsi engagement. Manual therapy focused on improved shoulder mobility through the use of soft tissue mobilization to his right deltoid and surrounding musculature followed by PROM. He reported that his shoulder was "a little tight" upon the conclusion of treatment. He continues to require skilled physical therapy to address his remaining impairments to return to his prior level of function.   OBJECTIVE IMPAIRMENTS: decreased activity tolerance, decreased knowledge of condition, decreased mobility, decreased ROM, decreased strength, hypomobility, increased edema, impaired tone, impaired UE functional use, and pain.   ACTIVITY LIMITATIONS: carrying, lifting, bathing, dressing, hygiene/grooming, and locomotion level  PARTICIPATION LIMITATIONS: meal prep, cleaning, laundry, shopping, and community activity  PERSONAL FACTORS: Age, Time since onset of injury/illness/exacerbation, and 3+ comorbidities: Hypertension, atrial fibrillation,  COPD, arthritis, allergies, history of cancer, and congestive heart failure  are also affecting patient's functional outcome.   REHAB POTENTIAL: Good  CLINICAL DECISION MAKING: Evolving/moderate complexity  EVALUATION COMPLEXITY: Moderate   GOALS: Goals reviewed with patient? Yes  SHORT TERM GOALS: Target date: 04/20/23  Patient will be independent with his initial HEP.  Baseline: Goal status: INITIAL  2.  Patient will be able to achieve at least 70 degrees of active shoulder  flexion for improved function reaching.  Baseline:  Goal status: MET  3.  Patient will be able to demonstrate at least 70 degrees of active right shoulder abduction for improved shoulder mobility.  Baseline:  Goal status: MET  4.  Patient will reported being able to don and doff his clothing with minimal to no difficulty for improved independence.  Baseline:  Goal status: INITIAL  LONG TERM GOALS: Target date: 05/11/23  Patient will be independent with his advanced HEP.  Baseline:  Goal status: IN PROGRESS  2.  Patient will be able to demonstrate at least 100 degrees of right shoulder flexion for improved function reaching overhead.  Baseline:  Goal status: IN PROGRESS  3.  Patient will be able to demonstrate at least 100 degrees of active right shoulder abduction for improved function reaching.  Baseline:  Goal status: IN PROGRESS  4.  Patient will be able to carry at least 5 pounds with his right upper extremity for improved function with his household activities.  Baseline:  Goal status: IN PROGRESS  PLAN:  PT FREQUENCY: 2x/week  PT DURATION: 6 weeks  PLANNED INTERVENTIONS: Therapeutic exercises, Therapeutic activity, Neuromuscular re-education, Patient/Family education, Self Care, Joint mobilization, Electrical stimulation, Cryotherapy, Moist heat, Vasopneumatic device, Manual therapy, and Re-evaluation  PLAN FOR NEXT SESSION: Pulleys, isometrics, PROM, pendulums, and modalities as needed    Granville Lewis, PT 05/12/2023, 6:30 PM

## 2023-05-14 ENCOUNTER — Ambulatory Visit: Payer: Medicare Other

## 2023-05-15 ENCOUNTER — Ambulatory Visit: Payer: Medicare Other

## 2023-05-15 DIAGNOSIS — M25611 Stiffness of right shoulder, not elsewhere classified: Secondary | ICD-10-CM | POA: Diagnosis not present

## 2023-05-15 DIAGNOSIS — M25511 Pain in right shoulder: Secondary | ICD-10-CM | POA: Diagnosis not present

## 2023-05-15 NOTE — Therapy (Signed)
[facility] OUTPATIENT PHYSICAL THERAPY SHOULDER TREATMENT   Patient Name: Larry Bautista MRN: [id number] DOB:[birth year], 82 y.o., male Today's Date: 05/15/2023  END OF SESSION:  PT End of Session - 05/15/23 1147     Visit Number 12    Number of Visits 18    Date for PT Re-Evaluation 07/03/23    PT Start Time 1146    PT Stop Time 1228    PT Time Calculation (min) 42 min    Activity Tolerance Patient tolerated treatment well    Behavior During Therapy [facility] for tasks assessed/performed                 Past Medical History:  Diagnosis Date   Allergy    Arthritis    Blood transfusion without reported diagnosis    Cancer (HCC)    esoph-chemo and rad   CHF (congestive heart failure) (HCC)    Chronic knee pain    Coronary artery disease    with LAD DE stent 2004   Dysplastic nevus 03/26/2016   right mid med calf   Dysrhythmia    Heel spur, left    Hyperlipidemia    Hypertension    Kidney stones    Lower back pain    status post surgery for spondylolisthesis   Migraine    Persistent atrial fibrillation (HCC)    longstanding persistent (since 05/2008)   Plantar fasciitis    Past Surgical History:  Procedure Laterality Date   BACK SURGERY     spondylolisthesis   CARDIAC CATHETERIZATION  03/06/2003   CORONARY ANGIOPLASTY WITH STENT PLACEMENT  04/09/2003   CYPHER stent implantation in the LAD   ESOPHAGOGASTRODUODENOSCOPY (EGD) WITH PROPOFOL N/A 01/07/2018   Procedure: ESOPHAGOGASTRODUODENOSCOPY (EGD) WITH PROPOFOL;  Surgeon: Graylin Shiver, MD;  Location: [facility] ENDOSCOPY;  Service: Endoscopy;  Laterality: N/A;   HAND SURGERY Right 09/08/2012   carpal tunnel   HERNIA REPAIR     INGUINAL HERNIA REPAIR Right 04/26/2020   Procedure: RIGHT INGUINAL HERNIA REPAIR WITH MESH;  Surgeon: Abigail Miyamoto, MD;  Location: [facility];  Service: General;  Laterality: Right;   REVERSE SHOULDER ARTHROPLASTY Right 03/26/2023   Procedure: REVERSE SHOULDER  ARTHROPLASTY;  Surgeon: Bjorn Pippin, MD;  Location: [facility];  Service: Orthopedics;  Laterality: Right;   SKIN CANCER EXCISION  05/10/1979   Face   SPINE SURGERY  2000   VASCULAR SURGERY     Vascular Stent   Patient Active Problem List   Diagnosis Date Noted   Gait disorder 01/14/2023   Hematoma of left knee region 08/22/2019   Knee pain 08/21/2019   Osteoarthritis of right shoulder 10/15/2018   Pulmonary nodule 09/30/2018   Adenocarcinoma of gastroesophageal junction (HCC) 01/14/2018   MRSA nasal colonization 01/12/2018   Acute GI bleeding 01/07/2018   Acute blood loss anemia 01/07/2018   COPD with chronic bronchitis 12/08/2017   Dyspnea 10/01/2016   Abnormal PFTs 10/01/2016   Hyperglycemia 03/28/2015   Allergic rhinitis 01/24/2015   Arthritis 01/24/2015   Basal cell carcinoma-1990 01/24/2015   BPH (benign prostatic hyperplasia) 01/24/2015   BMI 29.0-29.9,adult 01/24/2015   H/O renal calculi 01/24/2015   Headache, migraine 01/24/2015   AF (paroxysmal atrial fibrillation) (HCC) 01/24/2015   Spinal stenosis 01/24/2015   SPL (spondylolisthesis) 01/24/2015   Benign paroxysmal positional nystagmus 01/24/2015   Encounter for therapeutic drug monitoring 11/02/2013   Hyperlipidemia 05/03/2009   MIGRAINE HEADACHE 05/03/2009   Essential hypertension 05/03/2009   CAD in native  artery 05/03/2009   KNEE PAIN 05/03/2009   Backache 05/03/2009   History of tobacco use 05/03/2009    PCP: Jeani Sow, MD  REFERRING PROVIDER: Bjorn Pippin, MD   REFERRING DIAG: S/P Right Reverse Total Shoulder Replacement-DOS 03/26/23   THERAPY DIAG:  Stiffness of right shoulder, not elsewhere classified  Acute pain of right shoulder  Rationale for Evaluation and Treatment: Rehabilitation  ONSET DATE: 03/26/23  SUBJECTIVE:                                                                                                                                                                                       SUBJECTIVE STATEMENT: Patient reports that he is doing better and is not hurting today. He feels that he is about 50-75% back to where he wants to be.   PERTINENT HISTORY: Hypertension, atrial fibrillation, COPD, arthritis, allergies, history of cancer, and congestive heart failure  PAIN:  Are you having pain? Yes: NPRS scale: 0/10 Pain location: right shoulder and bicep Pain description: constant sore and ache Aggravating factors: none known Relieving factors: ice and medication  PRECAUTIONS: Shoulder  RED FLAGS: None   WEIGHT BEARING RESTRICTIONS: No  FALLS:  Has patient fallen in last 6 months?  No, but he has had balance problems since he had chemotherapy approximately 3 years ago  LIVING ENVIRONMENT: Lives with: lives with their spouse Lives in: House/apartment Has following equipment at home: Single point cane  OCCUPATION: Retired  PLOF: Independent  PATIENT GOALS:improved use of his right shoulder (driving, getting dressed)  NEXT MD VISIT: September 2024  OBJECTIVE: all objective measures were assessed at his initial evaluation on 03/30/23 unless otherwise noted  PATIENT SURVEYS:  FOTO 60.80 on 05/15/23  COGNITION: Overall cognitive status: Within functional limits for tasks assessed     SENSATION: Patient reports that he has some numbness in his left hand from chemotherapy.   UPPER EXTREMITY ROM:   Active ROM Right eval Right 04/27/23 Left eval  Shoulder flexion 30 93 115  Shoulder extension     Shoulder abduction 30 90 102  Shoulder adduction     Shoulder internal rotation   To L3  Shoulder external rotation -15 31 To T2  Elbow flexion     Elbow extension     Wrist flexion     Wrist extension     Wrist ulnar deviation     Wrist radial deviation     Wrist pronation     Wrist supination     (Blank rows = not tested)  UPPER EXTREMITY MMT: not tested due due to surgical condition  PALPATION:  TTP: right deltoid,  pectoralis major  and minor, and along incision   TODAY'S TREATMENT:                                                                                                                                         DATE:                                   05/15/23 EXERCISE LOG  Exercise Repetitions and Resistance Comments  Pulleys  5 minutes   Wall ladder  3 reps  Max #21   Resisted row  Green t-band x 2 minutes     Resisted IR  Yellow t-band x 20 reps    Resisted ER step out  Yellow t-band x 15 reps    Lifting overhead  2# x 15 reps  RUE only   Carrying  4#  RUE only   Bicep curl  4# x 20 reps  RUE only   Ball roll out (abduction)  30 reps    R shoulder ball press  20 reps w/ 5 second hold  In abduction   Resisted pull down  Green t-band x 2 x 10 reps     Blank cell = exercise not performed today                                    05/12/23 EXERCISE LOG  Exercise Repetitions and Resistance Comments  Pulleys 5 minutes   R shoulder flexion AROM  15 reps   AA ER with cane  2.5 minutes   Seated UE ranger  2 minutes each Flexion and horizontal ABD/ADD  Resisted row  Green t-band x 2 minutes    Resisted pull down  Green t-band x 20 reps    R shoulder ABD AROM  30 reps    AA cane flexion  3 x 7 reps  Seated   R shoulder ADD isometric  1.5 minutes w/ 5 second hold    Blank cell = exercise not performed today  Manual Therapy Soft Tissue Mobilization: right deltoid, for improved soft tissue extensibility Passive ROM: all planes (excluding extension), to tolerance                                                  05/08/23 XERCISE LOG  Exercise Repetitions and Resistance Comments  Pulleys 5 minutes    Wall ladder (assisted) 3 minutes #20 max  Seated UE Ranger  5 minutes.       In supine:  Gentle PROM x 12 minutes into flexion and ER per protocol  flexion to 105 degrees and ER to 20 degrees today  Vasopneumatic x 15 minutes to patient's right shoulder with pillow between elbow and thorax.   PATIENT  EDUCATION: Education details: healing, progress with therapy, objective findings, HEP, and goals for therapy Person educated: Patient Education method: Explanation Education comprehension: verbalized understanding,   HOME EXERCISE PROGRAM: HOME EXERCISE PROGRAM Created by [place] Applegate Aug 2nd, 2024 View at [email or web address] using code: [email or web address] Total 1 Page 1 of 1 WAND EXTERNAL ROTATION - SUPINE ER Lie on your back holding a cane or wand with both hands.  On the affected side, place a small rolled up towel or pillow under your elbow. Maintain approx. 90 degree bend at the elbow with your arm approximately 30-45 degrees away from your side. GENTLE. PAIN-FREE Use your other arm to pull the wand/cane to rotate the affected arm back into a stretch. Hold and then return to starting position and then repeat. Repeat 10 Times Hold 15 Seconds Complete 1 Set Perform 4 Times a Day  ASSESSMENT:  CLINICAL IMPRESSION: Patient is making good progress with skilled physical therapy as evidenced by his subjective reports, objective measures, functional mobility, and progress toward his goals. He has been able to meet all of his short term goals for therapy and his long term shoulder flexion goal. However, he continues to exhibit reduced right upper extremity strength as he is currently unable to lift anything heavier than two pounds overhead. His right shoulder abduction also remains limited at 87 degrees due to muscular strength and stiffness. Today's interventions focused on improved right shoulder muscular strengthening needed for improved function with his daily activities. He required minimal cueing with these interventions for proper biomechanics to prevent compensatory movement patterns. Muscular fatigue was his primary limitation with today's interventions. He reported that his shoulder felt "a little sore" upon the conclusion of treatment. Recommend that he continue with skilled physical therapy  for twice a week for three additional weeks to address his remaining impairments to maximize his functional mobility.   OBJECTIVE IMPAIRMENTS: decreased activity tolerance, decreased knowledge of condition, decreased mobility, decreased ROM, decreased strength, hypomobility, increased edema, impaired tone, impaired UE functional use, and pain.   ACTIVITY LIMITATIONS: carrying, lifting, bathing, dressing, hygiene/grooming, and locomotion level  PARTICIPATION LIMITATIONS: meal prep, cleaning, laundry, shopping, and community activity  PERSONAL FACTORS: Age, Time since onset of injury/illness/exacerbation, and 3+ comorbidities: Hypertension, atrial fibrillation, COPD, arthritis, allergies, history of cancer, and congestive heart failure  are also affecting patient's functional outcome.   REHAB POTENTIAL: Good  CLINICAL DECISION MAKING: Evolving/moderate complexity  EVALUATION COMPLEXITY: Moderate   GOALS: Goals reviewed with patient? Yes  SHORT TERM GOALS: Target date: 04/20/23  Patient will be independent with his initial HEP.  Baseline: Goal status: INITIAL  2.  Patient will be able to achieve at least 70 degrees of active shoulder flexion for improved function reaching.  Baseline:  Goal status: MET  3.  Patient will be able to demonstrate at least 70 degrees of active right shoulder abduction for improved shoulder mobility.  Baseline:  Goal status: MET  4.  Patient will reported being able to don and doff his clothing with minimal to no difficulty for improved independence.  Baseline:  Goal status: MET  LONG TERM GOALS: Target date: 05/11/23  Patient will be independent with his advanced HEP.  Baseline:  Goal status: IN PROGRESS  2.  Patient will be able to demonstrate at least 100 degrees of right shoulder flexion for improved function reaching overhead.  Baseline: 106 degrees  Goal status: MET  3.  Patient will be able to demonstrate at least 100 degrees of active right  shoulder abduction for improved function reaching.  Baseline: 87 degrees  Goal status: IN PROGRESS  4.  Patient will be able to carry at least 5 pounds with his right upper extremity for improved function with his household activities.  Baseline: 4 pounds currently  Goal status: IN PROGRESS  PLAN:  PT FREQUENCY: 2x/week  PT DURATION: 6 weeks  PLANNED INTERVENTIONS: Therapeutic exercises, Therapeutic activity, Neuromuscular re-education, Patient/Family education, Self Care, Joint mobilization, Electrical stimulation, Cryotherapy, Moist heat, Vasopneumatic device, Manual therapy, and Re-evaluation  PLAN FOR NEXT SESSION: Pulleys, isometrics, PROM, pendulums, and modalities as needed    Granville Lewis, PT 05/15/2023, 1:08 PM

## 2023-05-19 ENCOUNTER — Ambulatory Visit: Payer: Medicare Other

## 2023-05-19 DIAGNOSIS — M25511 Pain in right shoulder: Secondary | ICD-10-CM

## 2023-05-19 DIAGNOSIS — M25611 Stiffness of right shoulder, not elsewhere classified: Secondary | ICD-10-CM

## 2023-05-19 NOTE — Therapy (Signed)
[facility] OUTPATIENT PHYSICAL THERAPY SHOULDER TREATMENT   Patient Name: Larry Bautista MRN: [id number] DOB:1941/08/07, 82 y.o., male Today's Date: 05/19/2023  END OF SESSION:  PT End of Session - 05/19/23 1353     Visit Number 13    Number of Visits 18    Date for PT Re-Evaluation 07/03/23    PT Start Time 1345    PT Stop Time 1425    PT Time Calculation (min) 40 min    Activity Tolerance Patient tolerated treatment well    Behavior During Therapy [facility] for tasks assessed/performed                  Past Medical History:  Diagnosis Date   Allergy    Arthritis    Blood transfusion without reported diagnosis    Cancer (HCC)    esoph-chemo and rad   CHF (congestive heart failure) (HCC)    Chronic knee pain    Coronary artery disease    with LAD DE stent 2004   Dysplastic nevus 03/26/2016   right mid med calf   Dysrhythmia    Heel spur, left    Hyperlipidemia    Hypertension    Kidney stones    Lower back pain    status post surgery for spondylolisthesis   Migraine    Persistent atrial fibrillation (HCC)    longstanding persistent (since 05/2008)   Plantar fasciitis    Past Surgical History:  Procedure Laterality Date   BACK SURGERY     spondylolisthesis   CARDIAC CATHETERIZATION  03/06/2003   CORONARY ANGIOPLASTY WITH STENT PLACEMENT  04/09/2003   CYPHER stent implantation in the LAD   ESOPHAGOGASTRODUODENOSCOPY (EGD) WITH PROPOFOL N/A 01/07/2018   Procedure: ESOPHAGOGASTRODUODENOSCOPY (EGD) WITH PROPOFOL;  Surgeon: Graylin Shiver, MD;  Location: [facility]COPY;  Service: Endoscopy;  Laterality: N/A;   HAND SURGERY Right 09/08/2012   carpal tunnel   HERNIA REPAIR     INGUINAL HERNIA REPAIR Right 04/26/2020   Procedure: RIGHT INGUINAL HERNIA REPAIR WITH MESH;  Surgeon: Abigail Miyamoto, MD;  Location: [facility];  Service: General;  Laterality: Right;   REVERSE SHOULDER ARTHROPLASTY Right 03/26/2023   Procedure: REVERSE SHOULDER  ARTHROPLASTY;  Surgeon: Bjorn Pippin, MD;  Location: [facility];  Service: Orthopedics;  Laterality: Right;   SKIN CANCER EXCISION  05/10/1979   Face   SPINE SURGERY  2000   VASCULAR SURGERY     Vascular Stent   Patient Active Problem List   Diagnosis Date Noted   Gait disorder 01/14/2023   Hematoma of left knee region 08/22/2019   Knee pain 08/21/2019   Osteoarthritis of right shoulder 10/15/2018   Pulmonary nodule 09/30/2018   Adenocarcinoma of gastroesophageal junction (HCC) 01/14/2018   MRSA nasal colonization 01/12/2018   Acute GI bleeding 01/07/2018   Acute blood loss anemia 01/07/2018   COPD with chronic bronchitis 12/08/2017   Dyspnea 10/01/2016   Abnormal PFTs 10/01/2016   Hyperglycemia 03/28/2015   Allergic rhinitis 01/24/2015   Arthritis 01/24/2015   Basal cell carcinoma-1990 01/24/2015   BPH (benign prostatic hyperplasia) 01/24/2015   BMI 29.0-29.9,adult 01/24/2015   H/O renal calculi 01/24/2015   Headache, migraine 01/24/2015   AF (paroxysmal atrial fibrillation) (HCC) 01/24/2015   Spinal stenosis 01/24/2015   SPL (spondylolisthesis) 01/24/2015   Benign paroxysmal positional nystagmus 01/24/2015   Encounter for therapeutic drug monitoring 11/02/2013   Hyperlipidemia 05/03/2009   MIGRAINE HEADACHE 05/03/2009   Essential hypertension 05/03/2009   CAD in  native artery 05/03/2009   KNEE PAIN 05/03/2009   Backache 05/03/2009   History of tobacco use 05/03/2009    PCP: Jeani Sow, MD  REFERRING PROVIDER: Bjorn Pippin, MD   REFERRING DIAG: S/P Right Reverse Total Shoulder Replacement-DOS 03/26/23   THERAPY DIAG:  Stiffness of right shoulder, not elsewhere classified  Acute pain of right shoulder  Rationale for Evaluation and Treatment: Rehabilitation  ONSET DATE: 03/26/23  SUBJECTIVE:                                                                                                                                                                                       SUBJECTIVE STATEMENT: Patient reports that his shoulder feels good today.   PERTINENT HISTORY: Hypertension, atrial fibrillation, COPD, arthritis, allergies, history of cancer, and congestive heart failure  PAIN:  Are you having pain? Yes: NPRS scale: 0/10 Pain location: right shoulder and bicep Pain description: constant sore and ache Aggravating factors: none known Relieving factors: ice and medication  PRECAUTIONS: Shoulder  RED FLAGS: None   WEIGHT BEARING RESTRICTIONS: No  FALLS:  Has patient fallen in last 6 months?  No, but he has had balance problems since he had chemotherapy approximately 3 years ago  LIVING ENVIRONMENT: Lives with: lives with their spouse Lives in: House/apartment Has following equipment at home: Single point cane  OCCUPATION: Retired  PLOF: Independent  PATIENT GOALS:improved use of his right shoulder (driving, getting dressed)  NEXT MD VISIT: September 2024  OBJECTIVE: all objective measures were assessed at his initial evaluation on 03/30/23 unless otherwise noted  PATIENT SURVEYS:  FOTO 60.80 on 05/15/23  COGNITION: Overall cognitive status: Within functional limits for tasks assessed     SENSATION: Patient reports that he has some numbness in his left hand from chemotherapy.   UPPER EXTREMITY ROM:   Active ROM Right eval Right 04/27/23 Left eval  Shoulder flexion 30 93 115  Shoulder extension     Shoulder abduction 30 90 102  Shoulder adduction     Shoulder internal rotation   To L3  Shoulder external rotation -15 31 To T2  Elbow flexion     Elbow extension     Wrist flexion     Wrist extension     Wrist ulnar deviation     Wrist radial deviation     Wrist pronation     Wrist supination     (Blank rows = not tested)  UPPER EXTREMITY MMT: not tested due due to surgical condition  PALPATION:  TTP: right deltoid, pectoralis major and minor, and along incision   TODAY'S TREATMENT:  DATE:                                    05/19/23 EXERCISE LOG  Exercise Repetitions and Resistance Comments  Pulleys  6 minutes   Resisted row  Green t-band x 2 minutes    Resisted pull down  Red t-band x 2 minutes  RUE only   Resisted IR  Yellow t-band x 30 reps   RUE only   Bicep curl (neutral and forearm supination)  4# x 15 reps each  RUE only   Lifting overhead  2# x 2 x 10 reps  RUE only   R shoulder adduction  Red t-band x 20 reps    Wall ladder (scaption)  15 reps  Max # 20  Therabar bending  Red t-bar x 20 reps each   Up and down  UBE  6 minutes @ 90 RPM  3 minutes forward and backward    Blank cell = exercise not performed today                                   05/15/23 EXERCISE LOG  Exercise Repetitions and Resistance Comments  Pulleys  5 minutes   Wall ladder  3 reps  Max #21   Resisted row  Green t-band x 2 minutes     Resisted IR  Yellow t-band x 20 reps    Resisted ER step out  Yellow t-band x 15 reps    Lifting overhead  2# x 15 reps  RUE only   Carrying  4#  RUE only   Bicep curl  4# x 20 reps  RUE only   Ball roll out (abduction)  30 reps    R shoulder ball press  20 reps w/ 5 second hold  In abduction   Resisted pull down  Green t-band x 2 x 10 reps     Blank cell = exercise not performed today                                    05/12/23 EXERCISE LOG  Exercise Repetitions and Resistance Comments  Pulleys 5 minutes   R shoulder flexion AROM  15 reps   AA ER with cane  2.5 minutes   Seated UE ranger  2 minutes each Flexion and horizontal ABD/ADD  Resisted row  Green t-band x 2 minutes    Resisted pull down  Green t-band x 20 reps    R shoulder ABD AROM  30 reps    AA cane flexion  3 x 7 reps  Seated   R shoulder ADD isometric  1.5 minutes w/ 5 second hold    Blank cell = exercise not performed today  Manual Therapy Soft Tissue  Mobilization: right deltoid, for improved soft tissue extensibility Passive ROM: all planes (excluding extension), to tolerance     PATIENT EDUCATION: Education details: healing, progress with therapy, objective findings, HEP, and goals for therapy Person educated: Patient Education method: Explanation Education comprehension: verbalized understanding,   HOME EXERCISE PROGRAM: HOME EXERCISE PROGRAM Created by [place] Applegate Aug 2nd, 2024 View at [email or web address] using code: [email or web address] Total 1 Page 1 of 1 WAND EXTERNAL ROTATION - SUPINE ER Lie on your back holding a cane or wand with both  hands.  On the affected side, place a small rolled up towel or pillow under your elbow. Maintain approx. 90 degree bend at the elbow with your arm approximately 30-45 degrees away from your side. GENTLE. PAIN-FREE Use your other arm to pull the wand/cane to rotate the affected arm back into a stretch. Hold and then return to starting position and then repeat. Repeat 10 Times Hold 15 Seconds Complete 1 Set Perform 4 Times a Day  ASSESSMENT:  CLINICAL IMPRESSION: Patient was progressed with multiple new and familiar interventions for improve shoulder strength and mobility needed for improved functional mobility. He required minimal cueing with resisted internal rotation for proper positioning to maintain glenohumeral stability. Muscular fatigue was his primary limitation with today's interventions as he required brief rest breaks between today's interventions. He reported that his shoulder felt "sore" upon the conclusion of treatment. He continues to require skilled physical therapy to address his remaining impairments to return to his prior level of function.   OBJECTIVE IMPAIRMENTS: decreased activity tolerance, decreased knowledge of condition, decreased mobility, decreased ROM, decreased strength, hypomobility, increased edema, impaired tone, impaired UE functional use, and pain.   ACTIVITY  LIMITATIONS: carrying, lifting, bathing, dressing, hygiene/grooming, and locomotion level  PARTICIPATION LIMITATIONS: meal prep, cleaning, laundry, shopping, and community activity  PERSONAL FACTORS: Age, Time since onset of injury/illness/exacerbation, and 3+ comorbidities: Hypertension, atrial fibrillation, COPD, arthritis, allergies, history of cancer, and congestive heart failure  are also affecting patient's functional outcome.   REHAB POTENTIAL: Good  CLINICAL DECISION MAKING: Evolving/moderate complexity  EVALUATION COMPLEXITY: Moderate   GOALS: Goals reviewed with patient? Yes  SHORT TERM GOALS: Target date: 04/20/23  Patient will be independent with his initial HEP.  Baseline: Goal status: INITIAL  2.  Patient will be able to achieve at least 70 degrees of active shoulder flexion for improved function reaching.  Baseline:  Goal status: MET  3.  Patient will be able to demonstrate at least 70 degrees of active right shoulder abduction for improved shoulder mobility.  Baseline:  Goal status: MET  4.  Patient will reported being able to don and doff his clothing with minimal to no difficulty for improved independence.  Baseline:  Goal status: MET  LONG TERM GOALS: Target date: 05/11/23  Patient will be independent with his advanced HEP.  Baseline:  Goal status: IN PROGRESS  2.  Patient will be able to demonstrate at least 100 degrees of right shoulder flexion for improved function reaching overhead.  Baseline: 106 degrees  Goal status: MET  3.  Patient will be able to demonstrate at least 100 degrees of active right shoulder abduction for improved function reaching.  Baseline: 87 degrees  Goal status: IN PROGRESS  4.  Patient will be able to carry at least 5 pounds with his right upper extremity for improved function with his household activities.  Baseline: 4 pounds currently  Goal status: IN PROGRESS  PLAN:  PT FREQUENCY: 2x/week  PT DURATION: 6  weeks  PLANNED INTERVENTIONS: Therapeutic exercises, Therapeutic activity, Neuromuscular re-education, Patient/Family education, Self Care, Joint mobilization, Electrical stimulation, Cryotherapy, Moist heat, Vasopneumatic device, Manual therapy, and Re-evaluation  PLAN FOR NEXT SESSION: Pulleys, isometrics, PROM, pendulums, and modalities as needed    Granville Lewis, PT 05/19/2023, 2:27 PM

## 2023-05-20 ENCOUNTER — Ambulatory Visit: Payer: Medicare Other | Attending: Internal Medicine | Admitting: *Deleted

## 2023-05-20 DIAGNOSIS — Z5181 Encounter for therapeutic drug level monitoring: Secondary | ICD-10-CM | POA: Insufficient documentation

## 2023-05-20 DIAGNOSIS — I480 Paroxysmal atrial fibrillation: Secondary | ICD-10-CM | POA: Diagnosis not present

## 2023-05-20 LAB — POCT INR: INR: 2 (ref 2.0–3.0)

## 2023-05-20 NOTE — Patient Instructions (Addendum)
Description   Today take 5mg  of warfarin then continue taking Warfarin 1.25mg  daily except  2.5mg  on Mondays, Wednesdays and Fridays. Recheck INR in 3 weeks (normally 6 weeks).  Call with any new medications or procedure/surgery dates. [facility] [phone].  Fax number for shoulder procedure clearance [phone].

## 2023-05-21 ENCOUNTER — Ambulatory Visit: Payer: Medicare Other | Admitting: *Deleted

## 2023-05-21 ENCOUNTER — Encounter: Payer: Self-pay | Admitting: *Deleted

## 2023-05-21 DIAGNOSIS — M25611 Stiffness of right shoulder, not elsewhere classified: Secondary | ICD-10-CM | POA: Diagnosis not present

## 2023-05-21 DIAGNOSIS — M25511 Pain in right shoulder: Secondary | ICD-10-CM

## 2023-05-21 NOTE — Therapy (Signed)
[place] OUTPATIENT PHYSICAL THERAPY SHOULDER TREATMENT   Patient Name: Larry Bautista MRN: [id number] DOB:[birth year], 82 y.o., male Today's Date: 05/21/2023  END OF SESSION:  PT End of Session - 05/21/23 1355     Visit Number 14    Number of Visits 18    Date for PT Re-Evaluation 07/03/23    PT Start Time 1345    PT Stop Time 1430    PT Time Calculation (min) 45 min                  Past Medical History:  Diagnosis Date   Allergy    Arthritis    Blood transfusion without reported diagnosis    Cancer (HCC)    esoph-chemo and rad   CHF (congestive heart failure) (HCC)    Chronic knee pain    Coronary artery disease    with LAD DE stent 2004   Dysplastic nevus 03/26/2016   right mid med calf   Dysrhythmia    Heel spur, left    Hyperlipidemia    Hypertension    Kidney stones    Lower back pain    status post surgery for spondylolisthesis   Migraine    Persistent atrial fibrillation (HCC)    longstanding persistent (since 05/2008)   Plantar fasciitis    Past Surgical History:  Procedure Laterality Date   BACK SURGERY     spondylolisthesis   CARDIAC CATHETERIZATION  03/06/2003   CORONARY ANGIOPLASTY WITH STENT PLACEMENT  04/09/2003   CYPHER stent implantation in the LAD   ESOPHAGOGASTRODUODENOSCOPY (EGD) WITH PROPOFOL N/A 01/07/2018   Procedure: ESOPHAGOGASTRODUODENOSCOPY (EGD) WITH PROPOFOL;  Surgeon: Graylin Shiver, MD;  Location: [facility] END[facility]COPY;  Service: Endoscopy;  Laterality: N/A;   HAND SURGERY Right 09/08/2012   carpal tunnel   HERNIA REPAIR     INGUINAL HERNIA REPAIR Right 04/26/2020   Procedure: RIGHT INGUINAL HERNIA REPAIR WITH MESH;  Surgeon: Abigail Miyamoto, MD;  Location: [facility];  Service: General;  Laterality: Right;   REVERSE SHOULDER ARTHROPLASTY Right 03/26/2023   Procedure: REVERSE SHOULDER ARTHROPLASTY;  Surgeon: Bjorn Pippin, MD;  Location: [facility];  Service: Orthopedics;  Laterality: Right;    SKIN CANCER EXCISION  05/10/1979   Face   SPINE SURGERY  2000   VASCULAR SURGERY     Vascular Stent   Patient Active Problem List   Diagnosis Date Noted   Gait disorder 01/14/2023   Hematoma of left knee region 08/22/2019   Knee pain 08/21/2019   Osteoarthritis of right shoulder 10/15/2018   Pulmonary nodule 09/30/2018   Adenocarcinoma of gastroesophageal junction (HCC) 01/14/2018   MRSA nasal colonization 01/12/2018   Acute GI bleeding 01/07/2018   Acute blood loss anemia 01/07/2018   COPD with chronic bronchitis 12/08/2017   Dyspnea 10/01/2016   Abnormal PFTs 10/01/2016   Hyperglycemia 03/28/2015   Allergic rhinitis 01/24/2015   Arthritis 01/24/2015   Basal cell carcinoma-1990 01/24/2015   BPH (benign prostatic hyperplasia) 01/24/2015   BMI 29.0-29.9,adult 01/24/2015   H/O renal calculi 01/24/2015   Headache, migraine 01/24/2015   AF (paroxysmal atrial fibrillation) (HCC) 01/24/2015   Spinal stenosis 01/24/2015   SPL (spondylolisthesis) 01/24/2015   Benign paroxysmal positional nystagmus 01/24/2015   Encounter for therapeutic drug monitoring 11/02/2013   Hyperlipidemia 05/03/2009   MIGRAINE HEADACHE 05/03/2009   Essential hypertension 05/03/2009   CAD in native artery 05/03/2009   KNEE PAIN 05/03/2009   Backache 05/03/2009   History of tobacco use 05/03/2009  PCP: Jeani Sow, MD  REFERRING PROVIDER: Bjorn Pippin, MD   REFERRING DIAG: S/P Right Reverse Total Shoulder Replacement-DOS 03/26/23   THERAPY DIAG:  Stiffness of right shoulder, not elsewhere classified  Acute pain of right shoulder  Rationale for Evaluation and Treatment: Rehabilitation  ONSET DATE: 03/26/23  SUBJECTIVE:                                                                                                                                                                                      SUBJECTIVE STATEMENT: Patient reports that his shoulder feels good today. Just  sore  PERTINENT HISTORY: Hypertension, atrial fibrillation, COPD, arthritis, allergies, history of cancer, and congestive heart failure  PAIN:  Are you having pain? Yes: NPRS scale: 0/10 Pain location: right shoulder and bicep Pain description: constant sore and ache Aggravating factors: none known Relieving factors: ice and medication  PRECAUTIONS: Shoulder  RED FLAGS: None   WEIGHT BEARING RESTRICTIONS: No  FALLS:  Has patient fallen in last 6 months?  No, but he has had balance problems since he had chemotherapy approximately 3 years ago  LIVING ENVIRONMENT: Lives with: lives with their spouse Lives in: House/apartment Has following equipment at home: Single point cane  OCCUPATION: Retired  PLOF: Independent  PATIENT GOALS:improved use of his right shoulder (driving, getting dressed)  NEXT MD VISIT: September 2024  OBJECTIVE: all objective measures were assessed at his initial evaluation on 03/30/23 unless otherwise noted  PATIENT SURVEYS:  FOTO 60.80 on 05/15/23  COGNITION: Overall cognitive status: Within functional limits for tasks assessed     SENSATION: Patient reports that he has some numbness in his left hand from chemotherapy.   UPPER EXTREMITY ROM:   Active ROM Right eval Right 04/27/23 Left eval  Shoulder flexion 30 93 115  Shoulder extension     Shoulder abduction 30 90 102  Shoulder adduction     Shoulder internal rotation   To L3  Shoulder external rotation -15 31 To T2  Elbow flexion     Elbow extension     Wrist flexion     Wrist extension     Wrist ulnar deviation     Wrist radial deviation     Wrist pronation     Wrist supination     (Blank rows = not tested)  UPPER EXTREMITY MMT: not tested due due to surgical condition  PALPATION:  TTP: right deltoid, pectoralis major and minor, and along incision   TODAY'S TREATMENT:  DATE:                                    05/21/23 EXERCISE LOG   RT   03-26-23  Exercise Repetitions and Resistance Comments  Pulleys  6 minutes   UE ranger standing 3x10 elevation, CW/CCW   Resisted row  Red t-band x 2 minutes    Resisted pull down  Red t-band x 2 minutes  RUE only   Resisted IR  Yellow t-band 3x10 reps   RUE only   Bicep curl (neutral and forearm supination)   RUE only   Lifting cones eye level shelf   3 x 6 reps  in/out of cabinet RUE only   R shoulder adduction  Red t-band x 20 reps    Wall ladder (scaption)  10 reps  Max # 20  Therabar bending  Red t-bar x 20 reps each   Up and down  UBE  6 minutes @ 90 RPM  3 minutes forward and backward    Blank cell = exercise not performed today                                   05/15/23 EXERCISE LOG  Exercise Repetitions and Resistance Comments  Pulleys  5 minutes   Wall ladder  3 reps  Max #21   Resisted row  Green t-band x 2 minutes     Resisted IR  Yellow t-band x 20 reps    Resisted ER step out  Yellow t-band x 15 reps    Lifting overhead  2# x 15 reps  RUE only   Carrying  4#  RUE only   Bicep curl  4# x 20 reps  RUE only   Ball roll out (abduction)  30 reps    R shoulder ball press  20 reps w/ 5 second hold  In abduction   Resisted pull down  Green t-band x 2 x 10 reps     Blank cell = exercise not performed today                                    05/12/23 EXERCISE LOG  Exercise Repetitions and Resistance Comments  Pulleys 5 minutes   R shoulder flexion AROM  15 reps   AA ER with cane  2.5 minutes   Seated UE ranger  2 minutes each Flexion and horizontal ABD/ADD  Resisted row  Green t-band x 2 minutes    Resisted pull down  Green t-band x 20 reps    R shoulder ABD AROM  30 reps    AA cane flexion  3 x 7 reps  Seated   R shoulder ADD isometric  1.5 minutes w/ 5 second hold    Blank cell = exercise not performed today  Manual Therapy Soft Tissue Mobilization: right deltoid, for  improved soft tissue extensibility Passive ROM: all planes (excluding extension), to tolerance     PATIENT EDUCATION: Education details: healing, progress with therapy, objective findings, HEP, and goals for therapy Person educated: Patient Education method: Explanation Education comprehension: verbalized understanding,   HOME EXERCISE PROGRAM: HOME EXERCISE PROGRAM Created by [place] Applegate Aug 2nd, 2024 View at [email or web address] using code: [email or web address] Total 1 Page 1 of 1 WAND EXTERNAL ROTATION -  SUPINE ER Lie on your back holding a cane or wand with both hands.  On the affected side, place a small rolled up towel or pillow under your elbow. Maintain approx. 90 degree bend at the elbow with your arm approximately 30-45 degrees away from your side. GENTLE. PAIN-FREE Use your other arm to pull the wand/cane to rotate the affected arm back into a stretch. Hold and then return to starting position and then repeat. Repeat 10 Times Hold 15 Seconds Complete 1 Set Perform 4 Times a Day  ASSESSMENT:  CLINICAL IMPRESSION: Patient arrived today doing fairly well with mainly soreness RT shldr 8 weeks post-op today. AROM  flexion 105 /115 PROM, ER AROM 45 degrees. Pt able to continue toprogress with AAROM, AROM, and light strengthening exs.     OBJECTIVE IMPAIRMENTS: decreased activity tolerance, decreased knowledge of condition, decreased mobility, decreased ROM, decreased strength, hypomobility, increased edema, impaired tone, impaired UE functional use, and pain.   ACTIVITY LIMITATIONS: carrying, lifting, bathing, dressing, hygiene/grooming, and locomotion level  PARTICIPATION LIMITATIONS: meal prep, cleaning, laundry, shopping, and community activity  PERSONAL FACTORS: Age, Time since onset of injury/illness/exacerbation, and 3+ comorbidities: Hypertension, atrial fibrillation, COPD, arthritis, allergies, history of cancer, and congestive heart failure  are also affecting patient's  functional outcome.   REHAB POTENTIAL: Good  CLINICAL DECISION MAKING: Evolving/moderate complexity  EVALUATION COMPLEXITY: Moderate   GOALS: Goals reviewed with patient? Yes  SHORT TERM GOALS: Target date: 04/20/23  Patient will be independent with his initial HEP.  Baseline: Goal status: MET  2.  Patient will be able to achieve at least 70 degrees of active shoulder flexion for improved function reaching.  Baseline:  Goal status: MET  3.  Patient will be able to demonstrate at least 70 degrees of active right shoulder abduction for improved shoulder mobility.  Baseline:  Goal status: MET  4.  Patient will reported being able to don and doff his clothing with minimal to no difficulty for improved independence.  Baseline:  Goal status: MET  LONG TERM GOALS: Target date: 05/11/23  Patient will be independent with his advanced HEP.  Baseline:  Goal status: IN PROGRESS  2.  Patient will be able to demonstrate at least 100 degrees of right shoulder flexion for improved function reaching overhead.  Baseline: 106 degrees  Goal status: MET  3.  Patient will be able to demonstrate at least 100 degrees of active right shoulder abduction for improved function reaching.  Baseline: 87 degrees  Goal status: IN PROGRESS  4.  Patient will be able to carry at least 5 pounds with his right upper extremity for improved function with his household activities.  Baseline: 4 pounds currently  Goal status: IN PROGRESS  PLAN:  PT FREQUENCY: 2x/week  PT DURATION: 6 weeks  PLANNED INTERVENTIONS: Therapeutic exercises, Therapeutic activity, Neuromuscular re-education, Patient/Family education, Self Care, Joint mobilization, Electrical stimulation, Cryotherapy, Moist heat, Vasopneumatic device, Manual therapy, and Re-evaluation  PLAN FOR NEXT SESSION: Pulleys, isometrics, PROM, pendulums, and modalities as needed    Math Brazie,[name], PTA 05/21/2023, 2:42 PM

## 2023-05-26 ENCOUNTER — Ambulatory Visit: Payer: Medicare Other

## 2023-05-29 ENCOUNTER — Ambulatory Visit: Payer: Medicare Other | Admitting: Physical Therapy

## 2023-06-01 ENCOUNTER — Ambulatory Visit: Payer: Medicare Other | Admitting: Physical Therapy

## 2023-06-01 DIAGNOSIS — M25511 Pain in right shoulder: Secondary | ICD-10-CM | POA: Diagnosis not present

## 2023-06-01 DIAGNOSIS — M25611 Stiffness of right shoulder, not elsewhere classified: Secondary | ICD-10-CM

## 2023-06-01 NOTE — Therapy (Signed)
[place] OUTPATIENT PHYSICAL THERAPY SHOULDER TREATMENT   Patient Name: Larry Bautista MRN: [id number] DOB:12-10-1940, 82 y.o., male Today's Date: 06/01/2023  END OF SESSION:  PT End of Session - 06/01/23 1441     Visit Number 15    Number of Visits 18    Date for PT Re-Evaluation 07/03/23    PT Start Time 0145    PT Stop Time 0235    PT Time Calculation (min) 50 min    Activity Tolerance Patient tolerated treatment well    Behavior During Therapy [facility] for tasks assessed/performed                  Past Medical History:  Diagnosis Date   Allergy    Arthritis    Blood transfusion without reported diagnosis    Cancer (HCC)    esoph-chemo and rad   CHF (congestive heart failure) (HCC)    Chronic knee pain    Coronary artery disease    with LAD DE stent 2004   Dysplastic nevus 03/26/2016   right mid med calf   Dysrhythmia    Heel spur, left    Hyperlipidemia    Hypertension    Kidney stones    Lower back pain    status post surgery for spondylolisthesis   Migraine    Persistent atrial fibrillation (HCC)    longstanding persistent (since 05/2008)   Plantar fasciitis    Past Surgical History:  Procedure Laterality Date   BACK SURGERY     spondylolisthesis   CARDIAC CATHETERIZATION  03/06/2003   CORONARY ANGIOPLASTY WITH STENT PLACEMENT  04/09/2003   CYPHER stent implantation in the LAD   ESOPHAGOGASTRODUODENOSCOPY (EGD) WITH PROPOFOL N/A 01/07/2018   Procedure: ESOPHAGOGASTRODUODENOSCOPY (EGD) WITH PROPOFOL;  Surgeon: Graylin Shiver, MD;  Location: [facility];  Service: Endoscopy;  Laterality: N/A;   HAND SURGERY Right 09/08/2012   carpal tunnel   HERNIA REPAIR     INGUINAL HERNIA REPAIR Right 04/26/2020   Procedure: RIGHT INGUINAL HERNIA REPAIR WITH MESH;  Surgeon: Abigail Miyamoto, MD;  Location: [facility];  Service: General;  Laterality: Right;   REVERSE SHOULDER ARTHROPLASTY Right 03/26/2023   Procedure: REVERSE SHOULDER  ARTHROPLASTY;  Surgeon: Bjorn Pippin, MD;  Location: [facility];  Service: Orthopedics;  Laterality: Right;   SKIN CANCER EXCISION  05/10/1979   Face   SPINE SURGERY  2000   VASCULAR SURGERY     Vascular Stent   Patient Active Problem List   Diagnosis Date Noted   Gait disorder 01/14/2023   Hematoma of left knee region 08/22/2019   Knee pain 08/21/2019   Osteoarthritis of right shoulder 10/15/2018   Pulmonary nodule 09/30/2018   Adenocarcinoma of gastroesophageal junction (HCC) 01/14/2018   MRSA nasal colonization 01/12/2018   Acute GI bleeding 01/07/2018   Acute blood loss anemia 01/07/2018   COPD with chronic bronchitis 12/08/2017   Dyspnea 10/01/2016   Abnormal PFTs 10/01/2016   Hyperglycemia 03/28/2015   Allergic rhinitis 01/24/2015   Arthritis 01/24/2015   Basal cell carcinoma-1990 01/24/2015   BPH (benign prostatic hyperplasia) 01/24/2015   BMI 29.0-29.9,adult 01/24/2015   H/O renal calculi 01/24/2015   Headache, migraine 01/24/2015   AF (paroxysmal atrial fibrillation) (HCC) 01/24/2015   Spinal stenosis 01/24/2015   SPL (spondylolisthesis) 01/24/2015   Benign paroxysmal positional nystagmus 01/24/2015   Encounter for therapeutic drug monitoring 11/02/2013   Hyperlipidemia 05/03/2009   MIGRAINE HEADACHE 05/03/2009   Essential hypertension 05/03/2009   CAD in  native artery 05/03/2009   KNEE PAIN 05/03/2009   Backache 05/03/2009   History of tobacco use 05/03/2009    PCP: Jeani Sow, MD  REFERRING PROVIDER: Bjorn Pippin, MD   REFERRING DIAG: S/P Right Reverse Total Shoulder Replacement-DOS 03/26/23   THERAPY DIAG:  Stiffness of right shoulder, not elsewhere classified  Acute pain of right shoulder  Rationale for Evaluation and Treatment: Rehabilitation  ONSET DATE: 03/26/23  SUBJECTIVE:                                                                                                                                                                                       SUBJECTIVE STATEMENT: Patient rolled off couch last week and states he flared-up an old neck injury.  He states his shoulder pain is low but would like to go easy today.  I recommended to him and his wife that using a FWW would provide him with greater stability and safety.  He stated they have one at home and he does use it.  He states he did see a MD due to the neck pain and will see a neuro surgeon on 06/18/23.  PERTINENT HISTORY: Hypertension, atrial fibrillation, COPD, arthritis, allergies, history of cancer, and congestive heart failure  PAIN:  Are you having pain? Yes: NPRS scale: 1/10 Pain location: right shoulder and bicep Pain description: constant sore and ache Aggravating factors: none known Relieving factors: ice and medication  PRECAUTIONS: Shoulder  RED FLAGS: None   WEIGHT BEARING RESTRICTIONS: No  FALLS:  Has patient fallen in last 6 months?  No, but he has had balance problems since he had chemotherapy approximately 3 years ago  LIVING ENVIRONMENT: Lives with: lives with their spouse Lives in: House/apartment Has following equipment at home: Single point cane  OCCUPATION: Retired  PLOF: Independent  PATIENT GOALS:improved use of his right shoulder (driving, getting dressed)  NEXT MD VISIT: September 2024  OBJECTIVE: all objective measures were assessed at his initial evaluation on 03/30/23 unless otherwise noted  PATIENT SURVEYS:  FOTO 60.80 on 05/15/23  COGNITION: Overall cognitive status: Within functional limits for tasks assessed     SENSATION: Patient reports that he has some numbness in his left hand from chemotherapy.   UPPER EXTREMITY ROM:   Active ROM Right eval Right 04/27/23 Left eval  Shoulder flexion 30 93 115  Shoulder extension     Shoulder abduction 30 90 102  Shoulder adduction     Shoulder internal rotation   To L3  Shoulder external rotation -15 31 To T2  Elbow flexion     Elbow extension      Wrist flexion  Wrist extension     Wrist ulnar deviation     Wrist radial deviation     Wrist pronation     Wrist supination     (Blank rows = not tested)  UPPER EXTREMITY MMT: not tested due due to surgical condition  PALPATION:  TTP: right deltoid, pectoralis major and minor, and along incision   TODAY'S TREATMENT:                                                                                                                                         DATE:   06/01/23:                                     EXERCISE LOG  Exercise Repetitions and Resistance Comments  Pulleys 6 minutes   Seated UE Ranger  6 minutes                In supine:  Gentle PROM x 18 minutes to patient's right shoulder into flexion and ER f/b vasopneumatic to patient's right shoulder with pillow between thorax and elbow x 15 minutes.                                   05/21/23 EXERCISE LOG   RT   03-26-23  Exercise Repetitions and Resistance Comments  Pulleys  6 minutes   UE ranger standing 3x10 elevation, CW/CCW   Resisted row  Red t-band x 2 minutes    Resisted pull down  Red t-band x 2 minutes  RUE only   Resisted IR  Yellow t-band 3x10 reps   RUE only   Bicep curl (neutral and forearm supination)   RUE only   Lifting cones eye level shelf   3 x 6 reps  in/out of cabinet RUE only   R shoulder adduction  Red t-band x 20 reps    Wall ladder (scaption)  10 reps  Max # 20  Therabar bending  Red t-bar x 20 reps each   Up and down  UBE  6 minutes @ 90 RPM  3 minutes forward and backward    Blank cell = exercise not performed today                                   05/15/23 EXERCISE LOG  Exercise Repetitions and Resistance Comments  Pulleys  5 minutes   Wall ladder  3 reps  Max #21   Resisted row  Green t-band x 2 minutes     Resisted IR  Yellow t-band x 20 reps    Resisted ER step out  Yellow t-band x 15 reps    Lifting overhead  2# x 15 reps  RUE only   Carrying  4#  RUE only   Bicep curl  4# x  20 reps  RUE only   Ball roll out (abduction)  30 reps    R shoulder ball press  20 reps w/ 5 second hold  In abduction   Resisted pull down  Green t-band x 2 x 10 reps     Blank cell = exercise not performed today                                    05/12/23 EXERCISE LOG  Exercise Repetitions and Resistance Comments  Pulleys 5 minutes   R shoulder flexion AROM  15 reps   AA ER with cane  2.5 minutes   Seated UE ranger  2 minutes each Flexion and horizontal ABD/ADD  Resisted row  Green t-band x 2 minutes    Resisted pull down  Green t-band x 20 reps    R shoulder ABD AROM  30 reps    AA cane flexion  3 x 7 reps  Seated   R shoulder ADD isometric  1.5 minutes w/ 5 second hold    Blank cell = exercise not performed today  Manual Therapy Soft Tissue Mobilization: right deltoid, for improved soft tissue extensibility Passive ROM: all planes (excluding extension), to tolerance     PATIENT EDUCATION: Education details: healing, progress with therapy, objective findings, HEP, and goals for therapy Person educated: Patient Education method: Explanation Education comprehension: verbalized understanding,   HOME EXERCISE PROGRAM: HOME EXERCISE PROGRAM Created by [place] Favor Hackler Aug 2nd, 2024 View at [email or web address] using code: [email or web address] Total 1 Page 1 of 1 WAND EXTERNAL ROTATION - SUPINE ER Lie on your back holding a cane or wand with both hands.  On the affected side, place a small rolled up towel or pillow under your elbow. Maintain approx. 90 degree bend at the elbow with your arm approximately 30-45 degrees away from your side. GENTLE. PAIN-FREE Use your other arm to pull the wand/cane to rotate the affected arm back into a stretch. Hold and then return to starting position and then repeat. Repeat 10 Times Hold 15 Seconds Complete 1 Set Perform 4 Times a Day  ASSESSMENT:  CLINICAL IMPRESSION: Patient reported a flare-up of neck pain last week after rolling off his couch.  He  reports his right shoulder feels good.  He had no complaints with gentle right shoulder passive range of motion today.  Recommended to him and his wife that he use a FWW.     OBJECTIVE IMPAIRMENTS: decreased activity tolerance, decreased knowledge of condition, decreased mobility, decreased ROM, decreased strength, hypomobility, increased edema, impaired tone, impaired UE functional use, and pain.   ACTIVITY LIMITATIONS: carrying, lifting, bathing, dressing, hygiene/grooming, and locomotion level  PARTICIPATION LIMITATIONS: meal prep, cleaning, laundry, shopping, and community activity  PERSONAL FACTORS: Age, Time since onset of injury/illness/exacerbation, and 3+ comorbidities: Hypertension, atrial fibrillation, COPD, arthritis, allergies, history of cancer, and congestive heart failure  are also affecting patient's functional outcome.   REHAB POTENTIAL: Good  CLINICAL DECISION MAKING: Evolving/moderate complexity  EVALUATION COMPLEXITY: Moderate   GOALS: Goals reviewed with patient? Yes  SHORT TERM GOALS: Target date: 04/20/23  Patient will be independent with his initial HEP.  Baseline: Goal status: MET  2.  Patient will be able to achieve at least 70 degrees of active shoulder flexion for improved function reaching.  Baseline:  Goal status: MET  3.  Patient will be able to demonstrate at least 70 degrees of active right shoulder abduction for improved shoulder mobility.  Baseline:  Goal status: MET  4.  Patient will reported being able to don and doff his clothing with minimal to no difficulty for improved independence.  Baseline:  Goal status: MET  LONG TERM GOALS: Target date: 05/11/23  Patient will be independent with his advanced HEP.  Baseline:  Goal status: IN PROGRESS  2.  Patient will be able to demonstrate at least 100 degrees of right shoulder flexion for improved function reaching overhead.  Baseline: 106 degrees  Goal status: MET  3.  Patient will be able  to demonstrate at least 100 degrees of active right shoulder abduction for improved function reaching.  Baseline: 87 degrees  Goal status: IN PROGRESS  4.  Patient will be able to carry at least 5 pounds with his right upper extremity for improved function with his household activities.  Baseline: 4 pounds currently  Goal status: IN PROGRESS  PLAN:  PT FREQUENCY: 2x/week  PT DURATION: 6 weeks  PLANNED INTERVENTIONS: Therapeutic exercises, Therapeutic activity, Neuromuscular re-education, Patient/Family education, Self Care, Joint mobilization, Electrical stimulation, Cryotherapy, Moist heat, Vasopneumatic device, Manual therapy, and Re-evaluation  PLAN FOR NEXT SESSION: Pulleys, isometrics, PROM, pendulums, and modalities as needed    Sherleen Pangborn, [place], PT 06/01/2023, 2:58 PM

## 2023-06-02 ENCOUNTER — Other Ambulatory Visit (HOSPITAL_BASED_OUTPATIENT_CLINIC_OR_DEPARTMENT_OTHER): Payer: Self-pay | Admitting: Cardiology

## 2023-06-02 DIAGNOSIS — I480 Paroxysmal atrial fibrillation: Secondary | ICD-10-CM

## 2023-06-02 NOTE — Telephone Encounter (Signed)
Refill request for warfarin:  Last INR was 2.0 on 05/20/23 Next INR due 06/10/23 LOV was 02/05/23  Refill approved.

## 2023-06-04 ENCOUNTER — Ambulatory Visit: Payer: Medicare Other

## 2023-06-04 DIAGNOSIS — Z23 Encounter for immunization: Secondary | ICD-10-CM | POA: Diagnosis not present

## 2023-06-04 DIAGNOSIS — M25611 Stiffness of right shoulder, not elsewhere classified: Secondary | ICD-10-CM

## 2023-06-04 DIAGNOSIS — M25511 Pain in right shoulder: Secondary | ICD-10-CM

## 2023-06-04 NOTE — Therapy (Signed)
OUTPATIENT PHYSICAL THERAPY SHOULDER TREATMENT   Patient Name: Larry Bautista MRN: [id number] DOB:[birth year], 82 y.o., male Today's Date: 06/04/2023  END OF SESSION:  PT End of Session - 06/04/23 1348     Visit Number 16    Number of Visits 18    Date for PT Re-Evaluation 07/03/23    PT Start Time 1345    PT Stop Time 1426    PT Time Calculation (min) 41 min    Activity Tolerance Patient tolerated treatment well    Behavior During Therapy [facility] for tasks assessed/performed                   Past Medical History:  Diagnosis Date   Allergy    Arthritis    Blood transfusion without reported diagnosis    Cancer (HCC)    esoph-chemo and rad   CHF (congestive heart failure) (HCC)    Chronic knee pain    Coronary artery disease    with LAD DE stent 2004   Dysplastic nevus 03/26/2016   right mid med calf   Dysrhythmia    Heel spur, left    Hyperlipidemia    Hypertension    Kidney stones    Lower back pain    status post surgery for spondylolisthesis   Migraine    Persistent atrial fibrillation (HCC)    longstanding persistent (since 05/2008)   Plantar fasciitis    Past Surgical History:  Procedure Laterality Date   BACK SURGERY     spondylolisthesis   CARDIAC CATHETERIZATION  03/06/2003   CORONARY ANGIOPLASTY WITH STENT PLACEMENT  04/09/2003   CYPHER stent implantation in the LAD   ESOPHAGOGASTRODUODENOSCOPY (EGD) WITH PROPOFOL N/A 01/07/2018   Procedure: ESOPHAGOGASTRODUODENOSCOPY (EGD) WITH PROPOFOL;  Surgeon: Graylin Shiver, MD;  Location: [facility] ENDOSCOPY;  Service: Endoscopy;  Laterality: N/A;   HAND SURGERY Right 09/08/2012   carpal tunnel   HERNIA REPAIR     INGUINAL HERNIA REPAIR Right 04/26/2020   Procedure: RIGHT INGUINAL HERNIA REPAIR WITH MESH;  Surgeon: Abigail Miyamoto, MD;  Location: [facility];  Service: General;  Laterality: Right;   REVERSE SHOULDER ARTHROPLASTY Right 03/26/2023   Procedure: REVERSE SHOULDER ARTHROPLASTY;   Surgeon: Bjorn Pippin, MD;  Location: [facility];  Service: Orthopedics;  Laterality: Right;   SKIN CANCER EXCISION  05/10/1979   Face   SPINE SURGERY  2000   VASCULAR SURGERY     Vascular Stent   Patient Active Problem List   Diagnosis Date Noted   Gait disorder 01/14/2023   Hematoma of left knee region 08/22/2019   Knee pain 08/21/2019   Osteoarthritis of right shoulder 10/15/2018   Pulmonary nodule 09/30/2018   Adenocarcinoma of gastroesophageal junction (HCC) 01/14/2018   MRSA nasal colonization 01/12/2018   Acute GI bleeding 01/07/2018   Acute blood loss anemia 01/07/2018   COPD with chronic bronchitis 12/08/2017   Dyspnea 10/01/2016   Abnormal PFTs 10/01/2016   Hyperglycemia 03/28/2015   Allergic rhinitis 01/24/2015   Arthritis 01/24/2015   Basal cell carcinoma-1990 01/24/2015   BPH (benign prostatic hyperplasia) 01/24/2015   BMI 29.0-29.9,adult 01/24/2015   H/O renal calculi 01/24/2015   Headache, migraine 01/24/2015   AF (paroxysmal atrial fibrillation) (HCC) 01/24/2015   Spinal stenosis 01/24/2015   SPL (spondylolisthesis) 01/24/2015   Benign paroxysmal positional nystagmus 01/24/2015   Encounter for therapeutic drug monitoring 11/02/2013   Hyperlipidemia 05/03/2009   MIGRAINE HEADACHE 05/03/2009   Essential hypertension 05/03/2009   CAD in native  artery 05/03/2009   KNEE PAIN 05/03/2009   Backache 05/03/2009   History of tobacco use 05/03/2009    PCP: Jeani Sow, MD  REFERRING PROVIDER: Bjorn Pippin, MD   REFERRING DIAG: S/P Right Reverse Total Shoulder Replacement-DOS 03/26/23   THERAPY DIAG:  Stiffness of right shoulder, not elsewhere classified  Acute pain of right shoulder  Rationale for Evaluation and Treatment: Rehabilitation  ONSET DATE: 03/26/23  SUBJECTIVE:                                                                                                                                                                                       SUBJECTIVE STATEMENT: Patient reports that his shoulder is getting better and not hurting today.   PERTINENT HISTORY: Hypertension, atrial fibrillation, COPD, arthritis, allergies, history of cancer, and congestive heart failure  PAIN:  Are you having pain? Yes: NPRS scale: 0/10 Pain location: right shoulder and bicep Pain description: constant sore and ache Aggravating factors: none known Relieving factors: ice and medication  PRECAUTIONS: Shoulder  RED FLAGS: None   WEIGHT BEARING RESTRICTIONS: No  FALLS:  Has patient fallen in last 6 months?  No, but he has had balance problems since he had chemotherapy approximately 3 years ago  LIVING ENVIRONMENT: Lives with: lives with their spouse Lives in: House/apartment Has following equipment at home: Single point cane  OCCUPATION: Retired  PLOF: Independent  PATIENT GOALS:improved use of his right shoulder (driving, getting dressed)  NEXT MD VISIT: September 2024  OBJECTIVE: all objective measures were assessed at his initial evaluation on 03/30/23 unless otherwise noted  PATIENT SURVEYS:  FOTO 63.33 on 06/04/23  COGNITION: Overall cognitive status: Within functional limits for tasks assessed     SENSATION: Patient reports that he has some numbness in his left hand from chemotherapy.   UPPER EXTREMITY ROM:   Active ROM Right eval Right 04/27/23 Left eval  Shoulder flexion 30 93 115  Shoulder extension     Shoulder abduction 30 90 102  Shoulder adduction     Shoulder internal rotation   To L3  Shoulder external rotation -15 31 To T2  Elbow flexion     Elbow extension     Wrist flexion     Wrist extension     Wrist ulnar deviation     Wrist radial deviation     Wrist pronation     Wrist supination     (Blank rows = not tested)  UPPER EXTREMITY MMT: not tested due due to surgical condition  PALPATION:  TTP: right deltoid, pectoralis major and minor, and along incision   TODAY'S  TREATMENT:  DATE:                                    06/04/23 EXERCISE LOG  Exercise Repetitions and Resistance Comments  Pulleys  5 minutes Flexion  Wall ladder  2 minutes  Max #23  R shoulder resisted IR  Yellow t-band x 30 reps    R shoulder resisted row  Yellow t-band x 30 reps    R shoulder resisted pull down  Yellow t-band x 30 reps    R shoulder bicep curl  3# x 30 reps    Therabar bending  Green t-bar x 30 reps each  Up and down   Lifting overhead  4# x 30 reps  Using BUE  R shoulder resisted ER  Yellow t-band x 20 reps    UBE  8 minutes @ 90 RPM    UE ranger  2 minutes  Flexion   R shoulder abduction  2# x 30 reps     Wall push up  30 reps     Blank cell = exercise not performed today                                     06/01/23: EXERCISE LOG  Exercise Repetitions and Resistance Comments  Pulleys 6 minutes   Seated UE Ranger  6 minutes                In supine:  Gentle PROM x 18 minutes to patient's right shoulder into flexion and ER f/b vasopneumatic to patient's right shoulder with pillow between thorax and elbow x 15 minutes.                                   05/21/23 EXERCISE LOG   RT   03-26-23  Exercise Repetitions and Resistance Comments  Pulleys  6 minutes   UE ranger standing 3x10 elevation, CW/CCW   Resisted row  Red t-band x 2 minutes    Resisted pull down  Red t-band x 2 minutes  RUE only   Resisted IR  Yellow t-band 3x10 reps   RUE only   Bicep curl (neutral and forearm supination)   RUE only   Lifting cones eye level shelf   3 x 6 reps  in/out of cabinet RUE only   R shoulder adduction  Red t-band x 20 reps    Wall ladder (scaption)  10 reps  Max # 20  Therabar bending  Red t-bar x 20 reps each   Up and down  UBE  6 minutes @ 90 RPM  3 minutes forward and backward    Blank cell = exercise not performed today    PATIENT  EDUCATION: Education details: healing, progress with therapy, objective findings, HEP, and goals for therapy Person educated: Patient Education method: Explanation Education comprehension: verbalized understanding,   HOME EXERCISE PROGRAM: HOME EXERCISE PROGRAM Created by [place] Applegate Aug 2nd, 2024 View at [email or web address] using code: [email or web address] Total 1 Page 1 of 1 WAND EXTERNAL ROTATION - SUPINE ER Lie on your back holding a cane or wand with both hands.  On the affected side, place a small rolled up towel or pillow under your elbow. Maintain approx. 90 degree bend at the elbow with your arm approximately 30-45  degrees away from your side. GENTLE. PAIN-FREE Use your other arm to pull the wand/cane to rotate the affected arm back into a stretch. Hold and then return to starting position and then repeat. Repeat 10 Times Hold 15 Seconds Complete 1 Set Perform 4 Times a Day  ASSESSMENT:  CLINICAL IMPRESSION: Treatment focused on multiple new and familiar interventions for improved right shoulder strength and mobility. He required minimal cueing with resisted internal and external rotation to prevent right shoulder abduction to facilitate appropriate muscular engagement. Muscular fatigue was his primary limitation with today's interventions as he experienced no significant increase in pain or discomfort with any of today's interventions. He reported that his shoulder felt "a little tired" upon the conclusion of treatment. He continues to require skilled physical therapy to address his remaining impairments to maximize his functional mobility.   OBJECTIVE IMPAIRMENTS: decreased activity tolerance, decreased knowledge of condition, decreased mobility, decreased ROM, decreased strength, hypomobility, increased edema, impaired tone, impaired UE functional use, and pain.   ACTIVITY LIMITATIONS: carrying, lifting, bathing, dressing, hygiene/grooming, and locomotion level  PARTICIPATION  LIMITATIONS: meal prep, cleaning, laundry, shopping, and community activity  PERSONAL FACTORS: Age, Time since onset of injury/illness/exacerbation, and 3+ comorbidities: Hypertension, atrial fibrillation, COPD, arthritis, allergies, history of cancer, and congestive heart failure  are also affecting patient's functional outcome.   REHAB POTENTIAL: Good  CLINICAL DECISION MAKING: Evolving/moderate complexity  EVALUATION COMPLEXITY: Moderate   GOALS: Goals reviewed with patient? Yes  SHORT TERM GOALS: Target date: 04/20/23  Patient will be independent with his initial HEP.  Baseline: Goal status: MET  2.  Patient will be able to achieve at least 70 degrees of active shoulder flexion for improved function reaching.  Baseline:  Goal status: MET  3.  Patient will be able to demonstrate at least 70 degrees of active right shoulder abduction for improved shoulder mobility.  Baseline:  Goal status: MET  4.  Patient will reported being able to don and doff his clothing with minimal to no difficulty for improved independence.  Baseline:  Goal status: MET  LONG TERM GOALS: Target date: 05/11/23  Patient will be independent with his advanced HEP.  Baseline:  Goal status: IN PROGRESS  2.  Patient will be able to demonstrate at least 100 degrees of right shoulder flexion for improved function reaching overhead.  Baseline: 106 degrees  Goal status: MET  3.  Patient will be able to demonstrate at least 100 degrees of active right shoulder abduction for improved function reaching.  Baseline: 87 degrees  Goal status: IN PROGRESS  4.  Patient will be able to carry at least 5 pounds with his right upper extremity for improved function with his household activities.  Baseline: 4 pounds currently  Goal status: IN PROGRESS  PLAN:  PT FREQUENCY: 2x/week  PT DURATION: 6 weeks  PLANNED INTERVENTIONS: Therapeutic exercises, Therapeutic activity, Neuromuscular re-education, Patient/Family  education, Self Care, Joint mobilization, Electrical stimulation, Cryotherapy, Moist heat, Vasopneumatic device, Manual therapy, and Re-evaluation  PLAN FOR NEXT SESSION: Pulleys, isometrics, PROM, pendulums, and modalities as needed    Granville Lewis, PT 06/04/2023, 2:27 PM

## 2023-06-09 ENCOUNTER — Ambulatory Visit: Payer: Medicare Other | Attending: Orthopaedic Surgery | Admitting: Physical Therapy

## 2023-06-09 DIAGNOSIS — M25611 Stiffness of right shoulder, not elsewhere classified: Secondary | ICD-10-CM | POA: Insufficient documentation

## 2023-06-09 DIAGNOSIS — M25511 Pain in right shoulder: Secondary | ICD-10-CM | POA: Insufficient documentation

## 2023-06-09 NOTE — Therapy (Signed)
OUTPATIENT PHYSICAL THERAPY SHOULDER TREATMENT   Patient Name: Larry Bautista MRN: [id number] DOB:07-Apr-1941, 82 y.o., male Today's Date: 06/09/2023  END OF SESSION:  PT End of Session - 06/09/23 1400     Visit Number 17    Number of Visits 18    Date for PT Re-Evaluation 07/03/23    PT Start Time 0145    PT Stop Time 0228    PT Time Calculation (min) 43 min    Activity Tolerance Patient tolerated treatment well    Behavior During Therapy [facility] for tasks assessed/performed                   Past Medical History:  Diagnosis Date   Allergy    Arthritis    Blood transfusion without reported diagnosis    Cancer (HCC)    esoph-chemo and rad   CHF (congestive heart failure) (HCC)    Chronic knee pain    Coronary artery disease    with LAD DE stent 2004   Dysplastic nevus 03/26/2016   right mid med calf   Dysrhythmia    Heel spur, left    Hyperlipidemia    Hypertension    Kidney stones    Lower back pain    status post surgery for spondylolisthesis   Migraine    Persistent atrial fibrillation (HCC)    longstanding persistent (since 05/2008)   Plantar fasciitis    Past Surgical History:  Procedure Laterality Date   BACK SURGERY     spondylolisthesis   CARDIAC CATHETERIZATION  03/06/2003   CORONARY ANGIOPLASTY WITH STENT PLACEMENT  04/09/2003   CYPHER stent implantation in the LAD   ESOPHAGOGASTRODUODENOSCOPY (EGD) WITH PROPOFOL N/A 01/07/2018   Procedure: ESOPHAGOGASTRODUODENOSCOPY (EGD) WITH PROPOFOL;  Surgeon: Graylin Shiver, MD;  Location: [facility] ENDOSCOPY;  Service: Endoscopy;  Laterality: N/A;   HAND SURGERY Right 09/08/2012   carpal tunnel   HERNIA REPAIR     INGUINAL HERNIA REPAIR Right 04/26/2020   Procedure: RIGHT INGUINAL HERNIA REPAIR WITH MESH;  Surgeon: Abigail Miyamoto, MD;  Location: [facility];  Service: General;  Laterality: Right;   REVERSE SHOULDER ARTHROPLASTY Right 03/26/2023   Procedure: REVERSE SHOULDER ARTHROPLASTY;   Surgeon: Bjorn Pippin, MD;  Location: [facility];  Service: Orthopedics;  Laterality: Right;   SKIN CANCER EXCISION  05/10/1979   Face   SPINE SURGERY  2000   VASCULAR SURGERY     Vascular Stent   Patient Active Problem List   Diagnosis Date Noted   Gait disorder 01/14/2023   Hematoma of left knee region 08/22/2019   Knee pain 08/21/2019   Osteoarthritis of right shoulder 10/15/2018   Pulmonary nodule 09/30/2018   Adenocarcinoma of gastroesophageal junction (HCC) 01/14/2018   MRSA nasal colonization 01/12/2018   Acute GI bleeding 01/07/2018   Acute blood loss anemia 01/07/2018   COPD with chronic bronchitis (HCC) 12/08/2017   Dyspnea 10/01/2016   Abnormal PFTs 10/01/2016   Hyperglycemia 03/28/2015   Allergic rhinitis 01/24/2015   Arthritis 01/24/2015   Basal cell carcinoma-1990 01/24/2015   BPH (benign prostatic hyperplasia) 01/24/2015   BMI 29.0-29.9,adult 01/24/2015   H/O renal calculi 01/24/2015   Headache, migraine 01/24/2015   AF (paroxysmal atrial fibrillation) (HCC) 01/24/2015   Spinal stenosis 01/24/2015   SPL (spondylolisthesis) 01/24/2015   Benign paroxysmal positional nystagmus 01/24/2015   Encounter for therapeutic drug monitoring 11/02/2013   Hyperlipidemia 05/03/2009   MIGRAINE HEADACHE 05/03/2009   Essential hypertension 05/03/2009   CAD in  native artery 05/03/2009   KNEE PAIN 05/03/2009   Backache 05/03/2009   History of tobacco use 05/03/2009    PCP: Jeani Sow, MD  REFERRING PROVIDER: Bjorn Pippin, MD   REFERRING DIAG: S/P Right Reverse Total Shoulder Replacement-DOS 03/26/23   THERAPY DIAG:  Stiffness of right shoulder, not elsewhere classified  Acute pain of right shoulder  Rationale for Evaluation and Treatment: Rehabilitation  ONSET DATE: 03/26/23  SUBJECTIVE:                                                                                                                                                                                       SUBJECTIVE STATEMENT: Some muscle pain but not much shoulder pain.  PERTINENT HISTORY: Hypertension, atrial fibrillation, COPD, arthritis, allergies, history of cancer, and congestive heart failure  PAIN:  Are you having pain? Yes: NPRS scale: See above./10 Pain location: right shoulder and bicep Pain description: constant sore and ache Aggravating factors: none known Relieving factors: ice and medication  PRECAUTIONS: Shoulder  RED FLAGS: None   WEIGHT BEARING RESTRICTIONS: No  FALLS:  Has patient fallen in last 6 months?  No, but he has had balance problems since he had chemotherapy approximately 3 years ago  LIVING ENVIRONMENT: Lives with: lives with their spouse Lives in: House/apartment Has following equipment at home: Single point cane  OCCUPATION: Retired  PLOF: Independent  PATIENT GOALS:improved use of his right shoulder (driving, getting dressed)  NEXT MD VISIT: September 2024  OBJECTIVE: all objective measures were assessed at his initial evaluation on 03/30/23 unless otherwise noted  PATIENT SURVEYS:  FOTO 63.33 on 06/04/23  COGNITION: Overall cognitive status: Within functional limits for tasks assessed     SENSATION: Patient reports that he has some numbness in his left hand from chemotherapy.   UPPER EXTREMITY ROM:   Active ROM Right eval Right 04/27/23 Left eval  Shoulder flexion 30 93 115  Shoulder extension     Shoulder abduction 30 90 102  Shoulder adduction     Shoulder internal rotation   To L3  Shoulder external rotation -15 31 To T2  Elbow flexion     Elbow extension     Wrist flexion     Wrist extension     Wrist ulnar deviation     Wrist radial deviation     Wrist pronation     Wrist supination     (Blank rows = not tested)  UPPER EXTREMITY MMT: not tested due due to surgical condition  PALPATION:  TTP: right deltoid, pectoralis major and minor, and along incision   TODAY'S TREATMENT:  DATE:   06/09/23:                                     EXERCISE LOG  Exercise Repetitions and Resistance Comments  UBE 8 minutes at 120 RPM's (4 mins for and 4 mins back)   Pulleys 6 minutes   Wall ladder 5 minutes   Towel on wall 2 minutes   Door way stretch  2 minutes total   In supine:  PROM to patient's right shoulder x 15 minutes.                                    06/04/23 EXERCISE LOG  Exercise Repetitions and Resistance Comments  Pulleys  5 minutes Flexion  Wall ladder  2 minutes  Max #23  R shoulder resisted IR  Yellow t-band x 30 reps    R shoulder resisted row  Yellow t-band x 30 reps    R shoulder resisted pull down  Yellow t-band x 30 reps    R shoulder bicep curl  3# x 30 reps    Therabar bending  Green t-bar x 30 reps each  Up and down   Lifting overhead  4# x 30 reps  Using BUE  R shoulder resisted ER  Yellow t-band x 20 reps    UBE  8 minutes @ 90 RPM    UE ranger  2 minutes  Flexion   R shoulder abduction  2# x 30 reps     Wall push up  30 reps     Blank cell = exercise not performed today                                     06/01/23: EXERCISE LOG  Exercise Repetitions and Resistance Comments  Pulleys 6 minutes   Seated UE Ranger  6 minutes                In supine:  Gentle PROM x 18 minutes to patient's right shoulder into flexion and ER f/b vasopneumatic to patient's right shoulder with pillow between thorax and elbow x 15 minutes.                        PATIENT EDUCATION: Education details: healing, progress with therapy, objective findings, HEP, and goals for therapy Person educated: Patient Education method: Explanation Education comprehension: verbalized understanding,   HOME EXERCISE PROGRAM: HOME EXERCISE PROGRAM Created by [place] Kennette Cuthrell Aug 2nd, 2024 View at [email or web address] using code: [email or web address] Total 1 Page 1 of  1 WAND EXTERNAL ROTATION - SUPINE ER Lie on your back holding a cane or wand with both hands.  On the affected side, place a small rolled up towel or pillow under your elbow. Maintain approx. 90 degree bend at the elbow with your arm approximately 30-45 degrees away from your side. GENTLE. PAIN-FREE Use your other arm to pull the wand/cane to rotate the affected arm back into a stretch. Hold and then return to starting position and then repeat. Repeat 10 Times Hold 15 Seconds Complete 1 Set Perform 4 Times a Day  ASSESSMENT:  CLINICAL IMPRESSION: Patient did very well with PT today with the addition on towel on wall exercise and doorway stretch.  He  is reporting very little pain an dis pleased with his progress.    OBJECTIVE IMPAIRMENTS: decreased activity tolerance, decreased knowledge of condition, decreased mobility, decreased ROM, decreased strength, hypomobility, increased edema, impaired tone, impaired UE functional use, and pain.   ACTIVITY LIMITATIONS: carrying, lifting, bathing, dressing, hygiene/grooming, and locomotion level  PARTICIPATION LIMITATIONS: meal prep, cleaning, laundry, shopping, and community activity  PERSONAL FACTORS: Age, Time since onset of injury/illness/exacerbation, and 3+ comorbidities: Hypertension, atrial fibrillation, COPD, arthritis, allergies, history of cancer, and congestive heart failure  are also affecting patient's functional outcome.   REHAB POTENTIAL: Good  CLINICAL DECISION MAKING: Evolving/moderate complexity  EVALUATION COMPLEXITY: Moderate   GOALS: Goals reviewed with patient? Yes  SHORT TERM GOALS: Target date: 04/20/23  Patient will be independent with his initial HEP.  Baseline: Goal status: MET  2.  Patient will be able to achieve at least 70 degrees of active shoulder flexion for improved function reaching.  Baseline:  Goal status: MET  3.  Patient will be able to demonstrate at least 70 degrees of active right shoulder  abduction for improved shoulder mobility.  Baseline:  Goal status: MET  4.  Patient will reported being able to don and doff his clothing with minimal to no difficulty for improved independence.  Baseline:  Goal status: MET  LONG TERM GOALS: Target date: 05/11/23  Patient will be independent with his advanced HEP.  Baseline:  Goal status: IN PROGRESS  2.  Patient will be able to demonstrate at least 100 degrees of right shoulder flexion for improved function reaching overhead.  Baseline: 106 degrees  Goal status: MET  3.  Patient will be able to demonstrate at least 100 degrees of active right shoulder abduction for improved function reaching.  Baseline: 87 degrees  Goal status: IN PROGRESS  4.  Patient will be able to carry at least 5 pounds with his right upper extremity for improved function with his household activities.  Baseline: 4 pounds currently  Goal status: IN PROGRESS  PLAN:  PT FREQUENCY: 2x/week  PT DURATION: 6 weeks  PLANNED INTERVENTIONS: Therapeutic exercises, Therapeutic activity, Neuromuscular re-education, Patient/Family education, Self Care, Joint mobilization, Electrical stimulation, Cryotherapy, Moist heat, Vasopneumatic device, Manual therapy, and Re-evaluation  PLAN FOR NEXT SESSION: Pulleys, isometrics, PROM, pendulums, and modalities as needed    Oluwademilade Mckiver, [place], PT 06/09/2023, 2:31 PM

## 2023-06-11 ENCOUNTER — Ambulatory Visit: Payer: Medicare Other | Attending: Internal Medicine | Admitting: *Deleted

## 2023-06-11 DIAGNOSIS — I480 Paroxysmal atrial fibrillation: Secondary | ICD-10-CM

## 2023-06-11 DIAGNOSIS — Z5181 Encounter for therapeutic drug level monitoring: Secondary | ICD-10-CM

## 2023-06-11 LAB — POCT INR: INR: 2.3 (ref 2.0–3.0)

## 2023-06-11 NOTE — Patient Instructions (Signed)
Description   Continue taking Warfarin 1.25mg  daily except  2.5mg  on Mondays, Wednesdays and Fridays. Recheck INR in 5 weeks.   Call with any new medications or procedure/surgery dates. [facility] [phone].  Fax number for shoulder procedure clearance [phone].

## 2023-06-12 ENCOUNTER — Ambulatory Visit: Payer: Medicare Other

## 2023-06-12 DIAGNOSIS — M25511 Pain in right shoulder: Secondary | ICD-10-CM

## 2023-06-12 DIAGNOSIS — M25611 Stiffness of right shoulder, not elsewhere classified: Secondary | ICD-10-CM | POA: Diagnosis not present

## 2023-06-12 DIAGNOSIS — Z23 Encounter for immunization: Secondary | ICD-10-CM | POA: Diagnosis not present

## 2023-06-12 NOTE — Therapy (Signed)
OUTPATIENT PHYSICAL THERAPY SHOULDER TREATMENT   Patient Name: Larry Bautista MRN: [id number] DOB:1941/07/31, 82 y.o., male Today's Date: 06/12/2023  END OF SESSION:  PT End of Session - 06/12/23 1142     Visit Number 18    Number of Visits 18    Date for PT Re-Evaluation 07/03/23    PT Start Time 1145    PT Stop Time 1215    PT Time Calculation (min) 30 min    Activity Tolerance Patient tolerated treatment well    Behavior During Therapy [facility] for tasks assessed/performed                    Past Medical History:  Diagnosis Date   Allergy    Arthritis    Blood transfusion without reported diagnosis    Cancer (HCC)    esoph-chemo and rad   CHF (congestive heart failure) (HCC)    Chronic knee pain    Coronary artery disease    with LAD DE stent 2004   Dysplastic nevus 03/26/2016   right mid med calf   Dysrhythmia    Heel spur, left    Hyperlipidemia    Hypertension    Kidney stones    Lower back pain    status post surgery for spondylolisthesis   Migraine    Persistent atrial fibrillation (HCC)    longstanding persistent (since 05/2008)   Plantar fasciitis    Past Surgical History:  Procedure Laterality Date   BACK SURGERY     spondylolisthesis   CARDIAC CATHETERIZATION  03/06/2003   CORONARY ANGIOPLASTY WITH STENT PLACEMENT  04/09/2003   CYPHER stent implantation in the LAD   ESOPHAGOGASTRODUODENOSCOPY (EGD) WITH PROPOFOL N/A 01/07/2018   Procedure: ESOPHAGOGASTRODUODENOSCOPY (EGD) WITH PROPOFOL;  Surgeon: Graylin Shiver, MD;  Location: [facility] END[facility]COPY;  Service: Endoscopy;  Laterality: N/A;   HAND SURGERY Right 09/08/2012   carpal tunnel   HERNIA REPAIR     INGUINAL HERNIA REPAIR Right 04/26/2020   Procedure: RIGHT INGUINAL HERNIA REPAIR WITH MESH;  Surgeon: Abigail Miyamoto, MD;  Location: [facility];  Service: General;  Laterality: Right;   REVERSE SHOULDER ARTHROPLASTY Right 03/26/2023   Procedure: REVERSE SHOULDER ARTHROPLASTY;   Surgeon: Bjorn Pippin, MD;  Location: [facility];  Service: Orthopedics;  Laterality: Right;   SKIN CANCER EXCISION  05/10/1979   Face   SPINE SURGERY  2000   VASCULAR SURGERY     Vascular Stent   Patient Active Problem List   Diagnosis Date Noted   Gait disorder 01/14/2023   Hematoma of left knee region 08/22/2019   Knee pain 08/21/2019   Osteoarthritis of right shoulder 10/15/2018   Pulmonary nodule 09/30/2018   Adenocarcinoma of gastroesophageal junction (HCC) 01/14/2018   MRSA nasal colonization 01/12/2018   Acute GI bleeding 01/07/2018   Acute blood loss anemia 01/07/2018   COPD with chronic bronchitis (HCC) 12/08/2017   Dyspnea 10/01/2016   Abnormal PFTs 10/01/2016   Hyperglycemia 03/28/2015   Allergic rhinitis 01/24/2015   Arthritis 01/24/2015   Basal cell carcinoma-1990 01/24/2015   BPH (benign prostatic hyperplasia) 01/24/2015   BMI 29.0-29.9,adult 01/24/2015   H/O renal calculi 01/24/2015   Headache, migraine 01/24/2015   AF (paroxysmal atrial fibrillation) (HCC) 01/24/2015   Spinal stenosis 01/24/2015   SPL (spondylolisthesis) 01/24/2015   Benign paroxysmal positional nystagmus 01/24/2015   Encounter for therapeutic drug monitoring 11/02/2013   Hyperlipidemia 05/03/2009   MIGRAINE HEADACHE 05/03/2009   Essential hypertension 05/03/2009   CAD  in native artery 05/03/2009   KNEE PAIN 05/03/2009   Backache 05/03/2009   History of tobacco use 05/03/2009    PCP: Jeani Sow, MD  REFERRING PROVIDER: Bjorn Pippin, MD   REFERRING DIAG: S/P Right Reverse Total Shoulder Replacement-DOS 03/26/23   THERAPY DIAG:  Stiffness of right shoulder, not elsewhere classified  Acute pain of right shoulder  Rationale for Evaluation and Treatment: Rehabilitation  ONSET DATE: 03/26/23  SUBJECTIVE:                                                                                                                                                                                       SUBJECTIVE STATEMENT: Patient reports that his shoulder is not hurting today. He notes that he is able to do everything at home that he wants to.   PERTINENT HISTORY: Hypertension, atrial fibrillation, COPD, arthritis, allergies, history of cancer, and congestive heart failure  PAIN:  Are you having pain? Yes: NPRS scale: 0/10 Pain location: right shoulder and bicep Pain description: constant sore and ache Aggravating factors: none known Relieving factors: ice and medication  PRECAUTIONS: Shoulder  RED FLAGS: None   WEIGHT BEARING RESTRICTIONS: No  FALLS:  Has patient fallen in last 6 months?  No, but he has had balance problems since he had chemotherapy approximately 3 years ago  LIVING ENVIRONMENT: Lives with: lives with their spouse Lives in: House/apartment Has following equipment at home: Single point cane  OCCUPATION: Retired  PLOF: Independent  PATIENT GOALS:improved use of his right shoulder (driving, getting dressed)  NEXT MD VISIT: September 2024  OBJECTIVE: all objective measures were assessed at his initial evaluation on 03/30/23 unless otherwise noted  PATIENT SURVEYS:  FOTO 63.33 on 06/04/23  COGNITION: Overall cognitive status: Within functional limits for tasks assessed     SENSATION: Patient reports that he has some numbness in his left hand from chemotherapy.   UPPER EXTREMITY ROM:   Active ROM Right eval Right 04/27/23 Right 06/12/23 Left eval  Shoulder flexion 30 93 120 115  Shoulder extension      Shoulder abduction 30 90 105 102  Shoulder adduction      Shoulder internal rotation    To L3  Shoulder external rotation -15 31  To T2  Elbow flexion      Elbow extension      Wrist flexion      Wrist extension      Wrist ulnar deviation      Wrist radial deviation      Wrist pronation      Wrist supination      (Blank rows = not tested)  UPPER EXTREMITY MMT:  not tested due due to surgical condition  PALPATION:   TTP: right deltoid, pectoralis major and minor, and along incision   TODAY'S TREATMENT:                                                                                                                                         DATE:                                    06/12/23 EXERCISE LOG  Exercise Repetitions and Resistance Comments  Pulleys 5 minutes    Resisted row  Green t-band x 2.5 minutes   Resisted pull down  Green t-band x 2 minutes   UBE  X10 minutes @ 90 RPM                 Blank cell = exercise not performed today                                     06/09/23: EXERCISE LOG  Exercise Repetitions and Resistance Comments  UBE 8 minutes at 120 RPM's (4 mins for and 4 mins back)   Pulleys 6 minutes   Wall ladder 5 minutes   Towel on wall 2 minutes   Door way stretch  2 minutes total   In supine:  PROM to patient's right shoulder x 15 minutes.                                    06/04/23 EXERCISE LOG  Exercise Repetitions and Resistance Comments  Pulleys  5 minutes Flexion  Wall ladder  2 minutes  Max #23  R shoulder resisted IR  Yellow t-band x 30 reps    R shoulder resisted row  Yellow t-band x 30 reps    R shoulder resisted pull down  Yellow t-band x 30 reps    R shoulder bicep curl  3# x 30 reps    Therabar bending  Green t-bar x 30 reps each  Up and down   Lifting overhead  4# x 30 reps  Using BUE  R shoulder resisted ER  Yellow t-band x 20 reps    UBE  8 minutes @ 90 RPM    UE ranger  2 minutes  Flexion   R shoulder abduction  2# x 30 reps     Wall push up  30 reps     Blank cell = exercise not performed today    PATIENT EDUCATION: Education details: healing, progress with therapy, objective findings, HEP, and goals for therapy Person educated: Patient Education method: Explanation Education comprehension: verbalized understanding,   HOME EXERCISE PROGRAM: HOME EXERCISE PROGRAM Created by  [place] Applegate Aug 2nd, 2024 View at [email or web address] using  code: [email or web address] Total 1 Page 1 of 1 WAND EXTERNAL ROTATION - SUPINE ER Lie on your back holding a cane or wand with both hands.  On the affected side, place a small rolled up towel or pillow under your elbow. Maintain approx. 90 degree bend at the elbow with your arm approximately 30-45 degrees away from your side. GENTLE. PAIN-FREE Use your other arm to pull the wand/cane to rotate the affected arm back into a stretch. Hold and then return to starting position and then repeat. Repeat 10 Times Hold 15 Seconds Complete 1 Set Perform 4 Times a Day  ASSESSMENT:  CLINICAL IMPRESSION: Patient was able to meet all of his goals for skilled physical therapy at this time. He was able to demonstrate a significant improvement in his right shoulder AROM and strength since his last progress report on 05/15/23. He reported that he is able to complete all of his daily activities without limitation. His HEP was reviewed and he reported feeling comfortable with these interventions. He reported feeling comfortable being discharged from physical therapy at this time.   PHYSICAL THERAPY DISCHARGE SUMMARY  Visits from Start of Care: 18  Current functional level related to goals / functional outcomes: Patient was able to meet all of his goals for skilled physical therapy.    Remaining deficits: None    Education / Equipment: HEP   Patient agrees to discharge. Patient goals were met. Patient is being discharged due to meeting the stated rehab goals.  OBJECTIVE IMPAIRMENTS: decreased activity tolerance, decreased knowledge of condition, decreased mobility, decreased ROM, decreased strength, hypomobility, increased edema, impaired tone, impaired UE functional use, and pain.   ACTIVITY LIMITATIONS: carrying, lifting, bathing, dressing, hygiene/grooming, and locomotion level  PARTICIPATION LIMITATIONS: meal prep, cleaning, laundry, shopping, and community activity  PERSONAL FACTORS: Age, Time since onset of  injury/illness/exacerbation, and 3+ comorbidities: Hypertension, atrial fibrillation, COPD, arthritis, allergies, history of cancer, and congestive heart failure  are also affecting patient's functional outcome.   REHAB POTENTIAL: Good  CLINICAL DECISION MAKING: Evolving/moderate complexity  EVALUATION COMPLEXITY: Moderate   GOALS: Goals reviewed with patient? Yes  SHORT TERM GOALS: Target date: 04/20/23  Patient will be independent with his initial HEP.  Baseline: Goal status: MET  2.  Patient will be able to achieve at least 70 degrees of active shoulder flexion for improved function reaching.  Baseline:  Goal status: MET  3.  Patient will be able to demonstrate at least 70 degrees of active right shoulder abduction for improved shoulder mobility.  Baseline:  Goal status: MET  4.  Patient will reported being able to don and doff his clothing with minimal to no difficulty for improved independence.  Baseline:  Goal status: MET  LONG TERM GOALS: Target date: 05/11/23  Patient will be independent with his advanced HEP.  Baseline:  Goal status: MET  2.  Patient will be able to demonstrate at least 100 degrees of right shoulder flexion for improved function reaching overhead.  Baseline:  Goal status: MET  3.  Patient will be able to demonstrate at least 100 degrees of active right shoulder abduction for improved function reaching.  Baseline:  Goal status: MET  4.  Patient will be able to carry at least 5 pounds with his right upper extremity for improved function with his household activities.  Baseline: 5 pounds X 3 reps   Goal status: MET  PLAN:  PT FREQUENCY: 2x/week  PT DURATION: 6  weeks  PLANNED INTERVENTIONS: Therapeutic exercises, Therapeutic activity, Neuromuscular re-education, Patient/Family education, Self Care, Joint mobilization, Electrical stimulation, Cryotherapy, Moist heat, Vasopneumatic device, Manual therapy, and Re-evaluation  PLAN FOR NEXT  SESSION: Pulleys, isometrics, PROM, pendulums, and modalities as needed    Granville Lewis, PT 06/12/2023, 12:34 PM

## 2023-06-18 DIAGNOSIS — M19011 Primary osteoarthritis, right shoulder: Secondary | ICD-10-CM | POA: Diagnosis not present

## 2023-06-19 DIAGNOSIS — Z6826 Body mass index (BMI) 26.0-26.9, adult: Secondary | ICD-10-CM | POA: Diagnosis not present

## 2023-06-19 DIAGNOSIS — M47812 Spondylosis without myelopathy or radiculopathy, cervical region: Secondary | ICD-10-CM | POA: Diagnosis not present

## 2023-07-07 ENCOUNTER — Ambulatory Visit: Payer: Medicare Other | Attending: Neurological Surgery | Admitting: Physical Therapy

## 2023-07-07 ENCOUNTER — Other Ambulatory Visit: Payer: Self-pay

## 2023-07-07 DIAGNOSIS — R2681 Unsteadiness on feet: Secondary | ICD-10-CM | POA: Diagnosis not present

## 2023-07-07 DIAGNOSIS — M62838 Other muscle spasm: Secondary | ICD-10-CM

## 2023-07-07 DIAGNOSIS — M542 Cervicalgia: Secondary | ICD-10-CM

## 2023-07-07 NOTE — Therapy (Signed)
OUTPATIENT PHYSICAL THERAPY CERVICAL EVALUATION   Patient Name: Larry Bautista MRN: [id number] DOB:January 05, 1941, 82 y.o., male Today's Date: 07/07/2023  END OF SESSION:  PT End of Session - 07/07/23 1133     Visit Number 1    Number of Visits 12    Date for PT Re-Evaluation 10/05/23    PT Start Time 1103    Activity Tolerance Patient tolerated treatment well    Behavior During Therapy [facility] for tasks assessed/performed             Past Medical History:  Diagnosis Date   Allergy    Arthritis    Blood transfusion without reported diagnosis    Cancer (HCC)    esoph-chemo and rad   CHF (congestive heart failure) (HCC)    Chronic knee pain    Coronary artery disease    with LAD DE stent 2004   Dysplastic nevus 03/26/2016   right mid med calf   Dysrhythmia    Heel spur, left    Hyperlipidemia    Hypertension    Kidney stones    Lower back pain    status post surgery for spondylolisthesis   Migraine    Persistent atrial fibrillation (HCC)    longstanding persistent (since 05/2008)   Plantar fasciitis    Past Surgical History:  Procedure Laterality Date   BACK SURGERY     spondylolisthesis   CARDIAC CATHETERIZATION  03/06/2003   CORONARY ANGIOPLASTY WITH STENT PLACEMENT  04/09/2003   CYPHER stent implantation in the LAD   ESOPHAGOGASTRODUODENOSCOPY (EGD) WITH PROPOFOL N/A 01/07/2018   Procedure: ESOPHAGOGASTRODUODENOSCOPY (EGD) WITH PROPOFOL;  Surgeon: Graylin Shiver, MD;  Location: [facility]COPY;  Service: Endoscopy;  Laterality: N/A;   HAND SURGERY Right 09/08/2012   carpal tunnel   HERNIA REPAIR     INGUINAL HERNIA REPAIR Right 04/26/2020   Procedure: RIGHT INGUINAL HERNIA REPAIR WITH MESH;  Surgeon: Abigail Miyamoto, MD;  Location: [facility];  Service: General;  Laterality: Right;   REVERSE SHOULDER ARTHROPLASTY Right 03/26/2023   Procedure: REVERSE SHOULDER ARTHROPLASTY;  Surgeon: Bjorn Pippin, MD;  Location:  SURGERY CENTER;   Service: Orthopedics;  Laterality: Right;   SKIN CANCER EXCISION  05/10/1979   Face   SPINE SURGERY  2000   VASCULAR SURGERY     Vascular Stent   Patient Active Problem List   Diagnosis Date Noted   Gait disorder 01/14/2023   Hematoma of left knee region 08/22/2019   Knee pain 08/21/2019   Osteoarthritis of right shoulder 10/15/2018   Pulmonary nodule 09/30/2018   Adenocarcinoma of gastroesophageal junction (HCC) 01/14/2018   MRSA nasal colonization 01/12/2018   Acute GI bleeding 01/07/2018   Acute blood loss anemia 01/07/2018   COPD with chronic bronchitis (HCC) 12/08/2017   Dyspnea 10/01/2016   Abnormal PFTs 10/01/2016   Hyperglycemia 03/28/2015   Allergic rhinitis 01/24/2015   Arthritis 01/24/2015   Basal cell carcinoma-1990 01/24/2015   BPH (benign prostatic hyperplasia) 01/24/2015   BMI 29.0-29.9,adult 01/24/2015   H/O renal calculi 01/24/2015   Headache, migraine 01/24/2015   AF (paroxysmal atrial fibrillation) (HCC) 01/24/2015   Spinal stenosis 01/24/2015   SPL (spondylolisthesis) 01/24/2015   Benign paroxysmal positional nystagmus 01/24/2015   Encounter for therapeutic drug monitoring 11/02/2013   Hyperlipidemia 05/03/2009   MIGRAINE HEADACHE 05/03/2009   Essential hypertension 05/03/2009   CAD in native artery 05/03/2009   KNEE PAIN 05/03/2009   Backache 05/03/2009   History of tobacco use 05/03/2009  REFERRING PROVIDER: Barnett Abu MD  REFERRING DIAG: Spondylosis without myelopathy or radiculopathy, cervical region.  Balance problems.  THERAPY DIAG:  Cervicalgia  Unsteadiness on feet  Other muscle spasm  Rationale for Evaluation and Treatment: Rehabilitation  ONSET DATE: Ongoing.  SUBJECTIVE:                                                                                                                                                                                                         SUBJECTIVE STATEMENT: The patient presents to the  clinic with a flare-up of chronic neck pain after rolling off a couch several weeks ago.  He has had neck pain for many years.  Per Dr. Verlee Rossetti note he would also like [place] to include balance training in his therapy sessions.  It has been recommended today and in prior therapy sessions that the patient use his cane at all times.  He states that looking up can cause him to feel off balance. He has no neck pain at rest today but does report "popping" when rotating his neck and pain on the left side of a moderate nature.     PERTINENT HISTORY:  See above.    PAIN:  Are you having pain? No pain at rest.  Moderate pain especially with active left cervical rotation.  PRECAUTIONS: Fall  WEIGHT BEARING RESTRICTIONS: No  FALLS:  Has patient fallen in last 6 months? 5.  Missed step, fell getting into truck and fell from couch.  LIVING ENVIRONMENT: Lives with: lives with their spouse Lives in: House/apartment Has following equipment at home: Single point cane  OCCUPATION: Retired.  PLOF: Independent with basic ADLs and Independent with household mobility with device  PATIENT GOALS: Decrease pain when rotating neck and improve balance. OBJECTIVE:  Note: Objective measures were completed at Evaluation unless otherwise noted.  DIAGNOSTIC FINDINGS:  Moderate spondylotic changes of C5-6, C6-7.     POSTURE: rounded shoulders and forward head  PALPATION: Tender to palpation left of C4-5.   CERVICAL ROM:   Active right cervical rotation is 50 degrees and left is 45 degrees.    UPPER EXTREMITY MMT:  Normal left UE strength.  (Right TSA).  DTR's:  Normal bilateral Bi's/Brach DTR's, bil Tri's diminished.  FUNCTIONAL TESTS:  TUG 17 seconds with straight cane.  5 times sit to stand 18 seconds. Romberg a bit wobbly with finger touch x 2 on table for balance check.   TODAY'S TREATMENT:  DATE: HMP and pre-mod e'stim at 80-150 Hz x 15 minutes to patient's left cervical region (5 sec on and 5 sec off).   Normal modality response following removal of modality.   PATIENT EDUCATION:  Education details: Encouraged cane usage at all times. Person educated: Patient Education method: Medical illustrator Education comprehension: verbalized understanding and returned demonstration  HOME EXERCISE PROGRAM:   ASSESSMENT:  CLINICAL IMPRESSION: The patient presents to OPPT with c/o left-sided neck pain with rotation and balance issues.  He has limited active cervical rotation bilaterally and palpable pain left of C4-5.  His TUG was 17 seconds and his 5 time sit to stand test is 18 seconds.  His Romberg test was a bit wobbly and required finger touch to table x 2 for a balance check.  His gait with remarkable for a wide base of support.  Recommend he walk with his cane at all times.  Patient will benefit from skilled physical therapy intervention to address pain and deficits.  OBJECTIVE IMPAIRMENTS: Abnormal gait, decreased activity tolerance, decreased balance, decreased ROM, increased muscle spasms, postural dysfunction, and pain.   ACTIVITY LIMITATIONS: locomotion level  PARTICIPATION LIMITATIONS: cleaning, community activity, and yard work  PERSONAL FACTORS: 1 comorbidity: per wife, chemo treatments in past affected his balance  are also affecting patient's functional outcome.   REHAB POTENTIAL: Good  CLINICAL DECISION MAKING: Evolving/moderate complexity  EVALUATION COMPLEXITY: Moderate   GOALS:  LONG TERM GOALS: Target date: 10/05/23  Independent with a HEP.  Goal status: INITIAL  2.  Improve bilateral active cervical rotation to 60 degrees.  Goal status: INITIAL  3.  Improve TUG to 14-15 seconds.  Goal status: INITIAL  4.  Improve 5 time sit to stand test to 14-15 seconds.  Goal status: INITIAL PLAN:  PT FREQUENCY: 2x/week  PT  DURATION: 6 weeks  PLANNED INTERVENTIONS: 97110-Therapeutic exercises, 97530- Therapeutic activity, [id number]- Neuromuscular re-education, 97535- Self Care, [id number]- Manual therapy, 97014- Electrical stimulation (unattended), 97035- Ultrasound, Patient/Family education, Cryotherapy, and Moist heat  PLAN FOR NEXT SESSION: Postural exercises, STW/M and modalities as needed to affected cervical region.  Gait and balance activities.   Eliot Popper, [place], PT 07/07/2023, 12:13 PM

## 2023-07-09 DIAGNOSIS — R3121 Asymptomatic microscopic hematuria: Secondary | ICD-10-CM | POA: Diagnosis not present

## 2023-07-09 DIAGNOSIS — R3911 Hesitancy of micturition: Secondary | ICD-10-CM | POA: Diagnosis not present

## 2023-07-09 DIAGNOSIS — N401 Enlarged prostate with lower urinary tract symptoms: Secondary | ICD-10-CM | POA: Diagnosis not present

## 2023-07-09 DIAGNOSIS — N200 Calculus of kidney: Secondary | ICD-10-CM | POA: Diagnosis not present

## 2023-07-10 ENCOUNTER — Ambulatory Visit: Payer: Medicare Other | Attending: Neurological Surgery | Admitting: *Deleted

## 2023-07-10 ENCOUNTER — Encounter: Payer: Self-pay | Admitting: *Deleted

## 2023-07-10 DIAGNOSIS — R2681 Unsteadiness on feet: Secondary | ICD-10-CM | POA: Insufficient documentation

## 2023-07-10 DIAGNOSIS — M542 Cervicalgia: Secondary | ICD-10-CM | POA: Diagnosis not present

## 2023-07-10 DIAGNOSIS — M62838 Other muscle spasm: Secondary | ICD-10-CM | POA: Insufficient documentation

## 2023-07-10 DIAGNOSIS — M25611 Stiffness of right shoulder, not elsewhere classified: Secondary | ICD-10-CM | POA: Insufficient documentation

## 2023-07-10 NOTE — Therapy (Signed)
OUTPATIENT PHYSICAL THERAPY CERVICAL EVALUATION   Patient Name: Larry Bautista MRN: [id number] DOB:[birth year], 82 y.o., male Today's Date: 07/10/2023  END OF SESSION:  PT End of Session - 07/10/23 1147     Visit Number 2    Number of Visits 12    Date for PT Re-Evaluation 10/05/23    PT Start Time 1147    PT Stop Time 1237    PT Time Calculation (min) 50 min             Past Medical History:  Diagnosis Date   Allergy    Arthritis    Blood transfusion without reported diagnosis    Cancer (HCC)    esoph-chemo and rad   CHF (congestive heart failure) (HCC)    Chronic knee pain    Coronary artery disease    with LAD DE stent 2004   Dysplastic nevus 03/26/2016   right mid med calf   Dysrhythmia    Heel spur, left    Hyperlipidemia    Hypertension    Kidney stones    Lower back pain    status post surgery for spondylolisthesis   Migraine    Persistent atrial fibrillation (HCC)    longstanding persistent (since 05/2008)   Plantar fasciitis    Past Surgical History:  Procedure Laterality Date   BACK SURGERY     spondylolisthesis   CARDIAC CATHETERIZATION  03/06/2003   CORONARY ANGIOPLASTY WITH STENT PLACEMENT  04/09/2003   CYPHER stent implantation in the LAD   ESOPHAGOGASTRODUODENOSCOPY (EGD) WITH PROPOFOL N/A 01/07/2018   Procedure: ESOPHAGOGASTRODUODENOSCOPY (EGD) WITH PROPOFOL;  Surgeon: Graylin Shiver, MD;  Location: [facility] ENDOSCOPY;  Service: Endoscopy;  Laterality: N/A;   HAND SURGERY Right 09/08/2012   carpal tunnel   HERNIA REPAIR     INGUINAL HERNIA REPAIR Right 04/26/2020   Procedure: RIGHT INGUINAL HERNIA REPAIR WITH MESH;  Surgeon: Abigail Miyamoto, MD;  Location: [facility];  Service: General;  Laterality: Right;   REVERSE SHOULDER ARTHROPLASTY Right 03/26/2023   Procedure: REVERSE SHOULDER ARTHROPLASTY;  Surgeon: Bjorn Pippin, MD;  Location: [facility];  Service: Orthopedics;  Laterality: Right;   SKIN CANCER EXCISION   05/10/1979   Face   SPINE SURGERY  2000   VASCULAR SURGERY     Vascular Stent   Patient Active Problem List   Diagnosis Date Noted   Gait disorder 01/14/2023   Hematoma of left knee region 08/22/2019   Knee pain 08/21/2019   Osteoarthritis of right shoulder 10/15/2018   Pulmonary nodule 09/30/2018   Adenocarcinoma of gastroesophageal junction (HCC) 01/14/2018   MRSA nasal colonization 01/12/2018   Acute GI bleeding 01/07/2018   Acute blood loss anemia 01/07/2018   COPD with chronic bronchitis (HCC) 12/08/2017   Dyspnea 10/01/2016   Abnormal PFTs 10/01/2016   Hyperglycemia 03/28/2015   Allergic rhinitis 01/24/2015   Arthritis 01/24/2015   Basal cell carcinoma-1990 01/24/2015   BPH (benign prostatic hyperplasia) 01/24/2015   BMI 29.0-29.9,adult 01/24/2015   H/O renal calculi 01/24/2015   Headache, migraine 01/24/2015   AF (paroxysmal atrial fibrillation) (HCC) 01/24/2015   Spinal stenosis 01/24/2015   SPL (spondylolisthesis) 01/24/2015   Benign paroxysmal positional nystagmus 01/24/2015   Encounter for therapeutic drug monitoring 11/02/2013   Hyperlipidemia 05/03/2009   MIGRAINE HEADACHE 05/03/2009   Essential hypertension 05/03/2009   CAD in native artery 05/03/2009   KNEE PAIN 05/03/2009   Backache 05/03/2009   History of tobacco use 05/03/2009    REFERRING PROVIDER: Sherilyn Cooter  Elsner MD  REFERRING DIAG: Spondylosis without myelopathy or radiculopathy, cervical region.  Balance problems.  THERAPY DIAG:  Cervicalgia  Unsteadiness on feet  Rationale for Evaluation and Treatment: Rehabilitation  ONSET DATE: Ongoing.  SUBJECTIVE:                                                                                                                                                                                                         SUBJECTIVE STATEMENT:  Doing okay so far today. Neck pain 2-3/10    PERTINENT HISTORY:  See above.    PAIN:  Are you having pain? No  pain at rest.  Moderate pain especially with active left cervical rotation.  PRECAUTIONS: Fall  WEIGHT BEARING RESTRICTIONS: No  FALLS:  Has patient fallen in last 6 months? 5.  Missed step, fell getting into truck and fell from couch.  LIVING ENVIRONMENT: Lives with: lives with their spouse Lives in: House/apartment Has following equipment at home: Single point cane  OCCUPATION: Retired.  PLOF: Independent with basic ADLs and Independent with household mobility with device  PATIENT GOALS: Decrease pain when rotating neck and improve balance. OBJECTIVE:  Note: Objective measures were completed at Evaluation unless otherwise noted.  DIAGNOSTIC FINDINGS:  Moderate spondylotic changes of C5-6, C6-7.     POSTURE: rounded shoulders and forward head  PALPATION: Tender to palpation left of C4-5.   CERVICAL ROM:   Active right cervical rotation is 50 degrees and left is 45 degrees.    UPPER EXTREMITY MMT:  Normal left UE strength.  (Right TSA).  DTR's:  Normal bilateral Bi's/Brach DTR's, bil Tri's diminished.  FUNCTIONAL TESTS:  TUG 17 seconds with straight cane.  5 times sit to stand 18 seconds. Romberg a bit wobbly with finger touch x 2 on table for balance check.   TODAY'S TREATMENT:                                                                                                                              DATE:  07-10-23                                    EXERCISE LOG  Exercise Repetitions and Resistance Comments  Nustep X 8 mins L1   Balance: Rocker board    Tandem stance 3x30 secs each Bil `   Airex pad NBOS and marching x 3 mins        Blank cell = exercise not performed today    Manual STW/TPR to Bil. Cerv paras and Utraps with LT side > soreness                                                       HMP and pre-mod e'stim at 80-150 Hz x 15 minutes to patient's left cervical region (5 sec on and 5 sec  off).   Normal modality response following removal of modality.   PATIENT EDUCATION:  Education details: Encouraged cane usage at all times. Person educated: Patient Education method: Medical illustrator Education comprehension: verbalized understanding and returned demonstration  HOME EXERCISE PROGRAM:   ASSESSMENT:  CLINICAL IMPRESSION: Pt arrived today doing fairly well with low pain levels in his neck. Rx focused on balance act.'s as well as STW/TPR to bil Utraps and cerv. Paras with Pt sitting. Notable tenderness In bil Utraps as well as  cerv. Paras LT>RT. Pt did fair with balance act's, but needs CGA. Decreased pain after Rx      OBJECTIVE IMPAIRMENTS: Abnormal gait, decreased activity tolerance, decreased balance, decreased ROM, increased muscle spasms, postural dysfunction, and pain.   ACTIVITY LIMITATIONS: locomotion level  PARTICIPATION LIMITATIONS: cleaning, community activity, and yard work  PERSONAL FACTORS: 1 comorbidity: per wife, chemo treatments in past affected his balance  are also affecting patient's functional outcome.   REHAB POTENTIAL: Good  CLINICAL DECISION MAKING: Evolving/moderate complexity  EVALUATION COMPLEXITY: Moderate   GOALS:  LONG TERM GOALS: Target date: 10/05/23  Independent with a HEP.  Goal status: INITIAL  2.  Improve bilateral active cervical rotation to 60 degrees.  Goal status: INITIAL  3.  Improve TUG to 14-15 seconds.  Goal status: INITIAL  4.  Improve 5 time sit to stand test to 14-15 seconds.  Goal status: INITIAL PLAN:  PT FREQUENCY: 2x/week  PT DURATION: 6 weeks  PLANNED INTERVENTIONS: 97110-Therapeutic exercises, 97530- Therapeutic activity, [id number]- Neuromuscular re-education, 97535- Self Care, 16109- Manual therapy, 97014- Electrical stimulation (unattended), 97035- Ultrasound, Patient/Family education, Cryotherapy, and Moist heat  PLAN FOR NEXT SESSION: Postural exercises, STW/M and modalities as  needed to affected cervical region.  Gait and balance activities.   Mabel Roll,[name], PTA 07/10/2023, 12:47 PM

## 2023-07-14 ENCOUNTER — Encounter: Payer: Self-pay | Admitting: *Deleted

## 2023-07-14 ENCOUNTER — Ambulatory Visit: Payer: Medicare Other | Admitting: *Deleted

## 2023-07-14 DIAGNOSIS — M542 Cervicalgia: Secondary | ICD-10-CM | POA: Diagnosis not present

## 2023-07-14 DIAGNOSIS — M62838 Other muscle spasm: Secondary | ICD-10-CM | POA: Diagnosis not present

## 2023-07-14 DIAGNOSIS — M25611 Stiffness of right shoulder, not elsewhere classified: Secondary | ICD-10-CM | POA: Diagnosis not present

## 2023-07-14 DIAGNOSIS — R2681 Unsteadiness on feet: Secondary | ICD-10-CM | POA: Diagnosis not present

## 2023-07-14 NOTE — Therapy (Signed)
OUTPATIENT PHYSICAL THERAPY CERVICAL EVALUATION   Patient Name: Larry Bautista MRN: [id number] DOB:Sep 02, 1941, 82 y.o., male Today's Date: 07/14/2023  END OF SESSION:  PT End of Session - 07/14/23 1111     Visit Number 3    Number of Visits 12    Date for PT Re-Evaluation 10/05/23    PT Start Time 1100    PT Stop Time 1148    PT Time Calculation (min) 48 min             Past Medical History:  Diagnosis Date   Allergy    Arthritis    Blood transfusion without reported diagnosis    Cancer (HCC)    esoph-chemo and rad   CHF (congestive heart failure) (HCC)    Chronic knee pain    Coronary artery disease    with LAD DE stent 2004   Dysplastic nevus 03/26/2016   right mid med calf   Dysrhythmia    Heel spur, left    Hyperlipidemia    Hypertension    Kidney stones    Lower back pain    status post surgery for spondylolisthesis   Migraine    Persistent atrial fibrillation (HCC)    longstanding persistent (since 05/2008)   Plantar fasciitis    Past Surgical History:  Procedure Laterality Date   BACK SURGERY     spondylolisthesis   CARDIAC CATHETERIZATION  03/06/2003   CORONARY ANGIOPLASTY WITH STENT PLACEMENT  04/09/2003   CYPHER stent implantation in the LAD   ESOPHAGOGASTRODUODENOSCOPY (EGD) WITH PROPOFOL N/A 01/07/2018   Procedure: ESOPHAGOGASTRODUODENOSCOPY (EGD) WITH PROPOFOL;  Surgeon: Graylin Shiver, MD;  Location: [facility] ENDOSCOPY;  Service: Endoscopy;  Laterality: N/A;   HAND SURGERY Right 09/08/2012   carpal tunnel   HERNIA REPAIR     INGUINAL HERNIA REPAIR Right 04/26/2020   Procedure: RIGHT INGUINAL HERNIA REPAIR WITH MESH;  Surgeon: Abigail Miyamoto, MD;  Location: [facility];  Service: General;  Laterality: Right;   REVERSE SHOULDER ARTHROPLASTY Right 03/26/2023   Procedure: REVERSE SHOULDER ARTHROPLASTY;  Surgeon: Bjorn Pippin, MD;  Location: [facility];  Service: Orthopedics;  Laterality: Right;   SKIN CANCER EXCISION   05/10/1979   Face   SPINE SURGERY  2000   VASCULAR SURGERY     Vascular Stent   Patient Active Problem List   Diagnosis Date Noted   Gait disorder 01/14/2023   Hematoma of left knee region 08/22/2019   Knee pain 08/21/2019   Osteoarthritis of right shoulder 10/15/2018   Pulmonary nodule 09/30/2018   Adenocarcinoma of gastroesophageal junction (HCC) 01/14/2018   MRSA nasal colonization 01/12/2018   Acute GI bleeding 01/07/2018   Acute blood loss anemia 01/07/2018   COPD with chronic bronchitis (HCC) 12/08/2017   Dyspnea 10/01/2016   Abnormal PFTs 10/01/2016   Hyperglycemia 03/28/2015   Allergic rhinitis 01/24/2015   Arthritis 01/24/2015   Basal cell carcinoma-1990 01/24/2015   BPH (benign prostatic hyperplasia) 01/24/2015   BMI 29.0-29.9,adult 01/24/2015   H/O renal calculi 01/24/2015   Headache, migraine 01/24/2015   AF (paroxysmal atrial fibrillation) (HCC) 01/24/2015   Spinal stenosis 01/24/2015   SPL (spondylolisthesis) 01/24/2015   Benign paroxysmal positional nystagmus 01/24/2015   Encounter for therapeutic drug monitoring 11/02/2013   Hyperlipidemia 05/03/2009   MIGRAINE HEADACHE 05/03/2009   Essential hypertension 05/03/2009   CAD in native artery 05/03/2009   KNEE PAIN 05/03/2009   Backache 05/03/2009   History of tobacco use 05/03/2009    REFERRING PROVIDER: Sherilyn Cooter  Elsner MD  REFERRING DIAG: Spondylosis without myelopathy or radiculopathy, cervical region.  Balance problems.  THERAPY DIAG:  Cervicalgia  Unsteadiness on feet  Rationale for Evaluation and Treatment: Rehabilitation  ONSET DATE: Ongoing.  SUBJECTIVE:                                                                                                                                                                                                         SUBJECTIVE STATEMENT:  Doing okay so far today. Neck pain 2-3/10    PERTINENT HISTORY:  See above.    PAIN:  Are you having pain? No  pain at rest.  Moderate pain especially with active left cervical rotation.  PRECAUTIONS: Fall  WEIGHT BEARING RESTRICTIONS: No  FALLS:  Has patient fallen in last 6 months? 5.  Missed step, fell getting into truck and fell from couch.  LIVING ENVIRONMENT: Lives with: lives with their spouse Lives in: House/apartment Has following equipment at home: Single point cane  OCCUPATION: Retired.  PLOF: Independent with basic ADLs and Independent with household mobility with device  PATIENT GOALS: Decrease pain when rotating neck and improve balance. OBJECTIVE:  Note: Objective measures were completed at Evaluation unless otherwise noted.  DIAGNOSTIC FINDINGS:  Moderate spondylotic changes of C5-6, C6-7.     POSTURE: rounded shoulders and forward head  PALPATION: Tender to palpation left of C4-5.   CERVICAL ROM:   Active right cervical rotation is 50 degrees and left is 45 degrees.    UPPER EXTREMITY MMT:  Normal left UE strength.  (Right TSA).  DTR's:  Normal bilateral Bi's/Brach DTR's, bil Tri's diminished.  FUNCTIONAL TESTS:  TUG 17 seconds with straight cane.  5 times sit to stand 18 seconds. Romberg a bit wobbly with finger touch x 2 on table for balance check.   TODAY'S TREATMENT:                                                                                                                              DATE:  07-10-23                                    EXERCISE LOG  Exercise Repetitions and Resistance Comments  Nustep X 10 mins L1   Balance: Rocker board X 5 mins balance   Tandem stance 4x30 secs each Bil `   Airex pad NBOS and UE reachouts 3x20   Balance beam Side stepping x 3 mins    Blank cell = exercise not performed today    Manual STW/TPR to Bil. Cerv paras and Utraps with LT side > soreness                                                       HMP and pre-mod e'stim at 80-150 Hz x 15 minutes to  patient's left cervical region (5 sec on and 5 sec off).   Normal modality response following removal of modality.   PATIENT EDUCATION:  Education details: Encouraged cane usage at all times. Person educated: Patient Education method: Medical illustrator Education comprehension: verbalized understanding and returned demonstration  HOME EXERCISE PROGRAM:   ASSESSMENT:  CLINICAL IMPRESSION: Pt arrived today reporting doing good after last Rx and his neck is feeling some better.  Rx focused more on exs  as well as balance today with no notable Tps in UT's today and mainly soreness/ tightness cervical paras.. Pt did better with balance act's, but needs CGA.       OBJECTIVE IMPAIRMENTS: Abnormal gait, decreased activity tolerance, decreased balance, decreased ROM, increased muscle spasms, postural dysfunction, and pain.   ACTIVITY LIMITATIONS: locomotion level  PARTICIPATION LIMITATIONS: cleaning, community activity, and yard work  PERSONAL FACTORS: 1 comorbidity: per wife, chemo treatments in past affected his balance  are also affecting patient's functional outcome.   REHAB POTENTIAL: Good  CLINICAL DECISION MAKING: Evolving/moderate complexity  EVALUATION COMPLEXITY: Moderate   GOALS:  LONG TERM GOALS: Target date: 10/05/23  Independent with a HEP.  Goal status: INITIAL  2.  Improve bilateral active cervical rotation to 60 degrees.  Goal status: INITIAL  3.  Improve TUG to 14-15 seconds.  Goal status: INITIAL  4.  Improve 5 time sit to stand test to 14-15 seconds.  Goal status: INITIAL PLAN:  PT FREQUENCY: 2x/week  PT DURATION: 6 weeks  PLANNED INTERVENTIONS: 97110-Therapeutic exercises, 97530- Therapeutic activity, [id number]- Neuromuscular re-education, 97535- Self Care, [id number]- Manual therapy, 97014- Electrical stimulation (unattended), 97035- Ultrasound, Patient/Family education, Cryotherapy, and Moist heat  PLAN FOR NEXT SESSION: Postural exercises, STW/M  and modalities as needed to affected cervical region.  Gait and balance activities.   Lamarco Gudiel,[name], PTA 07/14/2023, 11:48 AM

## 2023-07-16 ENCOUNTER — Ambulatory Visit: Payer: Medicare Other | Attending: Cardiology | Admitting: *Deleted

## 2023-07-16 DIAGNOSIS — I480 Paroxysmal atrial fibrillation: Secondary | ICD-10-CM | POA: Insufficient documentation

## 2023-07-16 DIAGNOSIS — Z5181 Encounter for therapeutic drug level monitoring: Secondary | ICD-10-CM | POA: Diagnosis not present

## 2023-07-16 LAB — POCT INR: INR: 1.9 — AB (ref 2.0–3.0)

## 2023-07-16 NOTE — Patient Instructions (Addendum)
Description   Today take 2.5mg  of warfarin then continue taking Warfarin 1.25mg  daily except  2.5mg  on Mondays, Wednesdays and Fridays. Recheck INR in 5 weeks.   Call with any new medications or procedure/surgery dates. [facility] [phone].  Fax number for shoulder procedure clearance [phone].

## 2023-07-21 ENCOUNTER — Ambulatory Visit: Payer: Medicare Other

## 2023-07-21 DIAGNOSIS — R2681 Unsteadiness on feet: Secondary | ICD-10-CM | POA: Diagnosis not present

## 2023-07-21 DIAGNOSIS — M62838 Other muscle spasm: Secondary | ICD-10-CM

## 2023-07-21 DIAGNOSIS — M25611 Stiffness of right shoulder, not elsewhere classified: Secondary | ICD-10-CM | POA: Diagnosis not present

## 2023-07-21 DIAGNOSIS — M542 Cervicalgia: Secondary | ICD-10-CM

## 2023-07-21 NOTE — Therapy (Signed)
OUTPATIENT PHYSICAL THERAPY CERVICAL TREATMENT   Patient Name: Larry Bautista MRN: [id number] DOB:Nov 01, 1940, 82 y.o., male Today's Date: 07/21/2023  END OF SESSION:  PT End of Session - 07/21/23 1103     Visit Number 4    Number of Visits 12    Date for PT Re-Evaluation 10/05/23    PT Start Time 1101    PT Stop Time 1142    PT Time Calculation (min) 41 min    Activity Tolerance Patient tolerated treatment well    Behavior During Therapy [facility] for tasks assessed/performed              Past Medical History:  Diagnosis Date   Allergy    Arthritis    Blood transfusion without reported diagnosis    Cancer (HCC)    esoph-chemo and rad   CHF (congestive heart failure) (HCC)    Chronic knee pain    Coronary artery disease    with LAD DE stent 2004   Dysplastic nevus 03/26/2016   right mid med calf   Dysrhythmia    Heel spur, left    Hyperlipidemia    Hypertension    Kidney stones    Lower back pain    status post surgery for spondylolisthesis   Migraine    Persistent atrial fibrillation (HCC)    longstanding persistent (since 05/2008)   Plantar fasciitis    Past Surgical History:  Procedure Laterality Date   BACK SURGERY     spondylolisthesis   CARDIAC CATHETERIZATION  03/06/2003   CORONARY ANGIOPLASTY WITH STENT PLACEMENT  04/09/2003   CYPHER stent implantation in the LAD   ESOPHAGOGASTRODUODENOSCOPY (EGD) WITH PROPOFOL N/A 01/07/2018   Procedure: ESOPHAGOGASTRODUODENOSCOPY (EGD) WITH PROPOFOL;  Surgeon: Graylin Shiver, MD;  Location: [facility] ENDOSCOPY;  Service: Endoscopy;  Laterality: N/A;   HAND SURGERY Right 09/08/2012   carpal tunnel   HERNIA REPAIR     INGUINAL HERNIA REPAIR Right 04/26/2020   Procedure: RIGHT INGUINAL HERNIA REPAIR WITH MESH;  Surgeon: Abigail Miyamoto, MD;  Location: [facility];  Service: General;  Laterality: Right;   REVERSE SHOULDER ARTHROPLASTY Right 03/26/2023   Procedure: REVERSE SHOULDER ARTHROPLASTY;  Surgeon:  Bjorn Pippin, MD;  Location: [facility];  Service: Orthopedics;  Laterality: Right;   SKIN CANCER EXCISION  05/10/1979   Face   SPINE SURGERY  2000   VASCULAR SURGERY     Vascular Stent   Patient Active Problem List   Diagnosis Date Noted   Gait disorder 01/14/2023   Hematoma of left knee region 08/22/2019   Knee pain 08/21/2019   Osteoarthritis of right shoulder 10/15/2018   Pulmonary nodule 09/30/2018   Adenocarcinoma of gastroesophageal junction (HCC) 01/14/2018   MRSA nasal colonization 01/12/2018   Acute GI bleeding 01/07/2018   Acute blood loss anemia 01/07/2018   COPD with chronic bronchitis (HCC) 12/08/2017   Dyspnea 10/01/2016   Abnormal PFTs 10/01/2016   Hyperglycemia 03/28/2015   Allergic rhinitis 01/24/2015   Arthritis 01/24/2015   Basal cell carcinoma-1990 01/24/2015   BPH (benign prostatic hyperplasia) 01/24/2015   BMI 29.0-29.9,adult 01/24/2015   H/O renal calculi 01/24/2015   Headache, migraine 01/24/2015   AF (paroxysmal atrial fibrillation) (HCC) 01/24/2015   Spinal stenosis 01/24/2015   SPL (spondylolisthesis) 01/24/2015   Benign paroxysmal positional nystagmus 01/24/2015   Encounter for therapeutic drug monitoring 11/02/2013   Hyperlipidemia 05/03/2009   MIGRAINE HEADACHE 05/03/2009   Essential hypertension 05/03/2009   CAD in native artery 05/03/2009  KNEE PAIN 05/03/2009   Backache 05/03/2009   History of tobacco use 05/03/2009    REFERRING PROVIDER: Barnett Abu MD  REFERRING DIAG: Spondylosis without myelopathy or radiculopathy, cervical region.  Balance problems.  THERAPY DIAG:  Cervicalgia  Unsteadiness on feet  Other muscle spasm  Rationale for Evaluation and Treatment: Rehabilitation  ONSET DATE: Ongoing.  SUBJECTIVE:                                                                                                                                                                                                          SUBJECTIVE STATEMENT:  Patient reports that he feels good today and he is not hurting.   PERTINENT HISTORY:  See above.    PAIN:  Are you having pain? No pain at rest.  Moderate pain especially with active left cervical rotation.  PRECAUTIONS: Fall  WEIGHT BEARING RESTRICTIONS: No  FALLS:  Has patient fallen in last 6 months? 5.  Missed step, fell getting into truck and fell from couch.  LIVING ENVIRONMENT: Lives with: lives with their spouse Lives in: House/apartment Has following equipment at home: Single point cane  OCCUPATION: Retired.  PLOF: Independent with basic ADLs and Independent with household mobility with device  PATIENT GOALS: Decrease pain when rotating neck and improve balance. OBJECTIVE:  Note: Objective measures were completed at Evaluation unless otherwise noted.  DIAGNOSTIC FINDINGS:  Moderate spondylotic changes of C5-6, C6-7.     POSTURE: rounded shoulders and forward head  PALPATION: Tender to palpation left of C4-5.   CERVICAL ROM:   Active right cervical rotation is 50 degrees and left is 45 degrees.  UPPER EXTREMITY MMT:  Normal left UE strength.  (Right TSA).  DTR's:  Normal bilateral Bi's/Brach DTR's, bil Tri's diminished.  FUNCTIONAL TESTS:  TUG 17 seconds with straight cane.  5 times sit to stand 18 seconds. Romberg a bit wobbly with finger touch x 2 on table for balance check.   TODAY'S TREATMENT:  DATE:                                                                                           07/21/23 EXERCISE LOG  Exercise Repetitions and Resistance Comments  Nustep  L3 x 11 minutes   Marching on foam  3 minutes  Intermittent UE support   Rocker board  5 minutes  Intermittent UE support   Side stepping on foam  15 laps  1 HHA   NBOS walking on foam  10 laps  1 HHA   LAQ 4# x 2 minutes  Alternating  LE   Seated marching  4# x 2 x 10 reps each  Alternating LE   Blank cell = exercise not performed today                                     07-10-23 EXERCISE LOG  Exercise Repetitions and Resistance Comments  Nustep X 10 mins L1   Balance: Rocker board X 5 mins balance   Tandem stance 4x30 secs each Bil `   Airex pad NBOS and UE reachouts 3x20   Balance beam Side stepping x 3 mins    Blank cell = exercise not performed today    Manual STW/TPR to Bil. Cerv paras and Utraps with LT side > soreness                                                       HMP and pre-mod e'stim at 80-150 Hz x 15 minutes to patient's left cervical region (5 sec on and 5 sec off).   Normal modality response following removal of modality.   PATIENT EDUCATION:  Education details: balance, and benefits of exercise Person educated: Patient Education method: Explanation Education comprehension: verbalized understanding  HOME EXERCISE PROGRAM:   ASSESSMENT:  CLINICAL IMPRESSION: Patient was multiple new interventions for improved lower extremity strength and stability needed for improved safety. He required close supervision with today's standing interventions for safety to prevent a loss of balance. He also required intermittent upper extremity support from the parallel bars with today's standing interventions. He reported feeling tired, but good upon the conclusion of treatment. He continues to require skilled physical therapy to address his remaining impairments to maximize his safety and functional mobility.   OBJECTIVE IMPAIRMENTS: Abnormal gait, decreased activity tolerance, decreased balance, decreased ROM, increased muscle spasms, postural dysfunction, and pain.   ACTIVITY LIMITATIONS: locomotion level  PARTICIPATION LIMITATIONS: cleaning, community activity, and yard work  PERSONAL FACTORS: 1 comorbidity: per wife, chemo treatments in past affected his balance  are also affecting patient's functional  outcome.   REHAB POTENTIAL: Good  CLINICAL DECISION MAKING: Evolving/moderate complexity  EVALUATION COMPLEXITY: Moderate   GOALS:  LONG TERM GOALS: Target date: 10/05/23  Independent with a HEP.  Goal status: INITIAL  2.  Improve bilateral active cervical rotation to 60 degrees.  Goal  status: INITIAL  3.  Improve TUG to 14-15 seconds.  Goal status: INITIAL  4.  Improve 5 time sit to stand test to 14-15 seconds.  Goal status: INITIAL PLAN:  PT FREQUENCY: 2x/week  PT DURATION: 6 weeks  PLANNED INTERVENTIONS: 97110-Therapeutic exercises, 97530- Therapeutic activity, [id number]- Neuromuscular re-education, 97535- Self Care, [id number]- Manual therapy, 97014- Electrical stimulation (unattended), 97035- Ultrasound, Patient/Family education, Cryotherapy, and Moist heat  PLAN FOR NEXT SESSION: Postural exercises, STW/M and modalities as needed to affected cervical region.  Gait and balance activities.   Granville Lewis, PT 07/21/2023, 12:13 PM

## 2023-07-24 ENCOUNTER — Ambulatory Visit: Payer: Medicare Other

## 2023-07-24 DIAGNOSIS — M542 Cervicalgia: Secondary | ICD-10-CM

## 2023-07-24 DIAGNOSIS — M25611 Stiffness of right shoulder, not elsewhere classified: Secondary | ICD-10-CM | POA: Diagnosis not present

## 2023-07-24 DIAGNOSIS — R2681 Unsteadiness on feet: Secondary | ICD-10-CM | POA: Diagnosis not present

## 2023-07-24 DIAGNOSIS — M62838 Other muscle spasm: Secondary | ICD-10-CM | POA: Diagnosis not present

## 2023-07-24 NOTE — Therapy (Signed)
OUTPATIENT PHYSICAL THERAPY CERVICAL TREATMENT   Patient Name: Larry Bautista MRN: [id number] DOB:1941/04/22, 82 y.o., male Today's Date: 07/24/2023  END OF SESSION:  PT End of Session - 07/24/23 1158     Visit Number 5    Number of Visits 12    Date for PT Re-Evaluation 10/05/23    PT Start Time 1145    PT Stop Time 1227    PT Time Calculation (min) 42 min    Activity Tolerance Patient tolerated treatment well    Behavior During Therapy [facility] for tasks assessed/performed               Past Medical History:  Diagnosis Date   Allergy    Arthritis    Blood transfusion without reported diagnosis    Cancer (HCC)    esoph-chemo and rad   CHF (congestive heart failure) (HCC)    Chronic knee pain    Coronary artery disease    with LAD DE stent 2004   Dysplastic nevus 03/26/2016   right mid med calf   Dysrhythmia    Heel spur, left    Hyperlipidemia    Hypertension    Kidney stones    Lower back pain    status post surgery for spondylolisthesis   Migraine    Persistent atrial fibrillation (HCC)    longstanding persistent (since 05/2008)   Plantar fasciitis    Past Surgical History:  Procedure Laterality Date   BACK SURGERY     spondylolisthesis   CARDIAC CATHETERIZATION  03/06/2003   CORONARY ANGIOPLASTY WITH STENT PLACEMENT  04/09/2003   CYPHER stent implantation in the LAD   ESOPHAGOGASTRODUODENOSCOPY (EGD) WITH PROPOFOL N/A 01/07/2018   Procedure: ESOPHAGOGASTRODUODENOSCOPY (EGD) WITH PROPOFOL;  Surgeon: Graylin Shiver, MD;  Location: [facility] ENDOSCOPY;  Service: Endoscopy;  Laterality: N/A;   HAND SURGERY Right 09/08/2012   carpal tunnel   HERNIA REPAIR     INGUINAL HERNIA REPAIR Right 04/26/2020   Procedure: RIGHT INGUINAL HERNIA REPAIR WITH MESH;  Surgeon: Abigail Miyamoto, MD;  Location: [facility];  Service: General;  Laterality: Right;   REVERSE SHOULDER ARTHROPLASTY Right 03/26/2023   Procedure: REVERSE SHOULDER ARTHROPLASTY;  Surgeon:  Bjorn Pippin, MD;  Location: [facility];  Service: Orthopedics;  Laterality: Right;   SKIN CANCER EXCISION  05/10/1979   Face   SPINE SURGERY  2000   VASCULAR SURGERY     Vascular Stent   Patient Active Problem List   Diagnosis Date Noted   Gait disorder 01/14/2023   Hematoma of left knee region 08/22/2019   Knee pain 08/21/2019   Osteoarthritis of right shoulder 10/15/2018   Pulmonary nodule 09/30/2018   Adenocarcinoma of gastroesophageal junction (HCC) 01/14/2018   MRSA nasal colonization 01/12/2018   Acute GI bleeding 01/07/2018   Acute blood loss anemia 01/07/2018   COPD with chronic bronchitis (HCC) 12/08/2017   Dyspnea 10/01/2016   Abnormal PFTs 10/01/2016   Hyperglycemia 03/28/2015   Allergic rhinitis 01/24/2015   Arthritis 01/24/2015   Basal cell carcinoma-1990 01/24/2015   BPH (benign prostatic hyperplasia) 01/24/2015   BMI 29.0-29.9,adult 01/24/2015   H/O renal calculi 01/24/2015   Headache, migraine 01/24/2015   AF (paroxysmal atrial fibrillation) (HCC) 01/24/2015   Spinal stenosis 01/24/2015   SPL (spondylolisthesis) 01/24/2015   Benign paroxysmal positional nystagmus 01/24/2015   Encounter for therapeutic drug monitoring 11/02/2013   Hyperlipidemia 05/03/2009   MIGRAINE HEADACHE 05/03/2009   Essential hypertension 05/03/2009   CAD in native artery 05/03/2009  KNEE PAIN 05/03/2009   Backache 05/03/2009   History of tobacco use 05/03/2009    REFERRING PROVIDER: Barnett Abu MD  REFERRING DIAG: Spondylosis without myelopathy or radiculopathy, cervical region.  Balance problems.  THERAPY DIAG:  Cervicalgia  Unsteadiness on feet  Other muscle spasm  Rationale for Evaluation and Treatment: Rehabilitation  ONSET DATE: Ongoing.  SUBJECTIVE:                                                                                                                                                                                                          SUBJECTIVE STATEMENT:  Patient reports that he feels good today. He was not sore after his last appointment. He is trying to walk more without his cane.   PERTINENT HISTORY:  See above.    PAIN:  Are you having pain? No pain at rest.  Moderate pain especially with active left cervical rotation.  PRECAUTIONS: Fall  WEIGHT BEARING RESTRICTIONS: No  FALLS:  Has patient fallen in last 6 months? 5.  Missed step, fell getting into truck and fell from couch.  LIVING ENVIRONMENT: Lives with: lives with their spouse Lives in: House/apartment Has following equipment at home: Single point cane  OCCUPATION: Retired.  PLOF: Independent with basic ADLs and Independent with household mobility with device  PATIENT GOALS: Decrease pain when rotating neck and improve balance. OBJECTIVE:  Note: Objective measures were completed at Evaluation unless otherwise noted.  DIAGNOSTIC FINDINGS:  Moderate spondylotic changes of C5-6, C6-7.     POSTURE: rounded shoulders and forward head  PALPATION: Tender to palpation left of C4-5.   CERVICAL ROM:   Active right cervical rotation is 50 degrees and left is 45 degrees.  UPPER EXTREMITY MMT:  Normal left UE strength.  (Right TSA).  DTR's:  Normal bilateral Bi's/Brach DTR's, bil Tri's diminished.  FUNCTIONAL TESTS:  TUG 17 seconds with straight cane.  5 times sit to stand 18 seconds. Romberg a bit wobbly with finger touch x 2 on table for balance check.   TODAY'S TREATMENT:  DATE:                                                                                           07/24/23 EXERCISE LOG  Exercise Repetitions and Resistance Comments  Nustep  L4 x 12 minutes    Rocker board  5 minutes  BUE support   Cone taps  4 minutes  2 cones; 1-2 HHA; crossing midline  Picking up cones   1 HHA; for safety picking up items  from the floor  Marching on foam  2 minutes  Intermittent UE support   Static stance on foam  3 minutes  Narrow BOS; intermittent UE support  NBOS walking on foam  4 laps  Forward and backward; BUE support   Side stepping on foam  7 laps  1 HHA   LAQ 4# x 25 reps each    Seated marching  4# x 25 reps each     Blank cell = exercise not performed today                                    07/21/23 EXERCISE LOG  Exercise Repetitions and Resistance Comments  Nustep  L3 x 11 minutes   Marching on foam  3 minutes  Intermittent UE support   Rocker board  5 minutes  Intermittent UE support   Side stepping on foam  15 laps  1 HHA   NBOS walking on foam  10 laps  1 HHA   LAQ 4# x 2 minutes  Alternating LE   Seated marching  4# x 2 x 10 reps each  Alternating LE   Blank cell = exercise not performed today                                     07-10-23 EXERCISE LOG  Exercise Repetitions and Resistance Comments  Nustep X 10 mins L1   Balance: Rocker board X 5 mins balance   Tandem stance 4x30 secs each Bil `   Airex pad NBOS and UE reachouts 3x20   Balance beam Side stepping x 3 mins    Blank cell = exercise not performed today    Manual STW/TPR to Bil. Cerv paras and Utraps with LT side > soreness                                                       HMP and pre-mod e'stim at 80-150 Hz x 15 minutes to patient's left cervical region (5 sec on and 5 sec off).   Normal modality response following removal of modality.   PATIENT EDUCATION:  Education details: balance, and benefits of exercise Person educated: Patient Education method: Explanation Education comprehension: verbalized understanding  HOME EXERCISE PROGRAM:   ASSESSMENT:  CLINICAL IMPRESSION: Patient was progressed with static stance on  foam for improved safety on unstable terrain. He required minimal cueing with today's standing interventions for reduced utilization of his upper extremity support for increased demand on his  lower extremities. He experienced a moderate increase in lower extremity fatigue with today's interventions. He reported feeling "a little sore" upon the conclusion of treatment. He continues to require skilled physical therapy to address his remaining impairments to maximize his safety and functional mobility.   OBJECTIVE IMPAIRMENTS: Abnormal gait, decreased activity tolerance, decreased balance, decreased ROM, increased muscle spasms, postural dysfunction, and pain.   ACTIVITY LIMITATIONS: locomotion level  PARTICIPATION LIMITATIONS: cleaning, community activity, and yard work  PERSONAL FACTORS: 1 comorbidity: per wife, chemo treatments in past affected his balance  are also affecting patient's functional outcome.   REHAB POTENTIAL: Good  CLINICAL DECISION MAKING: Evolving/moderate complexity  EVALUATION COMPLEXITY: Moderate   GOALS:  LONG TERM GOALS: Target date: 10/05/23  Independent with a HEP.  Goal status: INITIAL  2.  Improve bilateral active cervical rotation to 60 degrees.  Goal status: INITIAL  3.  Improve TUG to 14-15 seconds.  Goal status: INITIAL  4.  Improve 5 time sit to stand test to 14-15 seconds.  Goal status: INITIAL PLAN:  PT FREQUENCY: 2x/week  PT DURATION: 6 weeks  PLANNED INTERVENTIONS: 97110-Therapeutic exercises, 97530- Therapeutic activity, [id number]- Neuromuscular re-education, 97535- Self Care, 16109- Manual therapy, 97014- Electrical stimulation (unattended), 97035- Ultrasound, Patient/Family education, Cryotherapy, and Moist heat  PLAN FOR NEXT SESSION: Postural exercises, STW/M and modalities as needed to affected cervical region.  Gait and balance activities.   Granville Lewis, PT 07/24/2023, 12:38 PM

## 2023-07-28 ENCOUNTER — Ambulatory Visit: Payer: Medicare Other

## 2023-07-28 DIAGNOSIS — M542 Cervicalgia: Secondary | ICD-10-CM

## 2023-07-28 DIAGNOSIS — R2681 Unsteadiness on feet: Secondary | ICD-10-CM

## 2023-07-28 DIAGNOSIS — M25611 Stiffness of right shoulder, not elsewhere classified: Secondary | ICD-10-CM | POA: Diagnosis not present

## 2023-07-28 DIAGNOSIS — M62838 Other muscle spasm: Secondary | ICD-10-CM

## 2023-07-28 NOTE — Therapy (Signed)
OUTPATIENT PHYSICAL THERAPY CERVICAL TREATMENT   Patient Name: Larry Bautista MRN: [id number] DOB:[birth year], 82 y.o., male Today's Date: 07/28/2023  END OF SESSION:  PT End of Session - 07/28/23 1102     Visit Number 6    Number of Visits 12    Date for PT Re-Evaluation 10/05/23    PT Start Time 1100    PT Stop Time 1144    PT Time Calculation (min) 44 min    Activity Tolerance Patient tolerated treatment well    Behavior During Therapy [facility] for tasks assessed/performed               Past Medical History:  Diagnosis Date   Allergy    Arthritis    Blood transfusion without reported diagnosis    Cancer (HCC)    esoph-chemo and rad   CHF (congestive heart failure) (HCC)    Chronic knee pain    Coronary artery disease    with LAD DE stent 2004   Dysplastic nevus 03/26/2016   right mid med calf   Dysrhythmia    Heel spur, left    Hyperlipidemia    Hypertension    Kidney stones    Lower back pain    status post surgery for spondylolisthesis   Migraine    Persistent atrial fibrillation (HCC)    longstanding persistent (since 05/2008)   Plantar fasciitis    Past Surgical History:  Procedure Laterality Date   BACK SURGERY     spondylolisthesis   CARDIAC CATHETERIZATION  03/06/2003   CORONARY ANGIOPLASTY WITH STENT PLACEMENT  04/09/2003   CYPHER stent implantation in the LAD   ESOPHAGOGASTRODUODENOSCOPY (EGD) WITH PROPOFOL N/A 01/07/2018   Procedure: ESOPHAGOGASTRODUODENOSCOPY (EGD) WITH PROPOFOL;  Surgeon: Graylin Shiver, MD;  Location: [facility];  Service: Endoscopy;  Laterality: N/A;   HAND SURGERY Right 09/08/2012   carpal tunnel   HERNIA REPAIR     INGUINAL HERNIA REPAIR Right 04/26/2020   Procedure: RIGHT INGUINAL HERNIA REPAIR WITH MESH;  Surgeon: Abigail Miyamoto, MD;  Location: [facility];  Service: General;  Laterality: Right;   REVERSE SHOULDER ARTHROPLASTY Right 03/26/2023   Procedure: REVERSE SHOULDER ARTHROPLASTY;  Surgeon:  Bjorn Pippin, MD;  Location: [facility];  Service: Orthopedics;  Laterality: Right;   SKIN CANCER EXCISION  05/10/1979   Face   SPINE SURGERY  2000   VASCULAR SURGERY     Vascular Stent   Patient Active Problem List   Diagnosis Date Noted   Gait disorder 01/14/2023   Hematoma of left knee region 08/22/2019   Knee pain 08/21/2019   Osteoarthritis of right shoulder 10/15/2018   Pulmonary nodule 09/30/2018   Adenocarcinoma of gastroesophageal junction (HCC) 01/14/2018   MRSA nasal colonization 01/12/2018   Acute GI bleeding 01/07/2018   Acute blood loss anemia 01/07/2018   COPD with chronic bronchitis (HCC) 12/08/2017   Dyspnea 10/01/2016   Abnormal PFTs 10/01/2016   Hyperglycemia 03/28/2015   Allergic rhinitis 01/24/2015   Arthritis 01/24/2015   Basal cell carcinoma-1990 01/24/2015   BPH (benign prostatic hyperplasia) 01/24/2015   BMI 29.0-29.9,adult 01/24/2015   H/O renal calculi 01/24/2015   Headache, migraine 01/24/2015   AF (paroxysmal atrial fibrillation) (HCC) 01/24/2015   Spinal stenosis 01/24/2015   SPL (spondylolisthesis) 01/24/2015   Benign paroxysmal positional nystagmus 01/24/2015   Encounter for therapeutic drug monitoring 11/02/2013   Hyperlipidemia 05/03/2009   MIGRAINE HEADACHE 05/03/2009   Essential hypertension 05/03/2009   CAD in native artery 05/03/2009  KNEE PAIN 05/03/2009   Backache 05/03/2009   History of tobacco use 05/03/2009    REFERRING PROVIDER: Barnett Abu MD  REFERRING DIAG: Spondylosis without myelopathy or radiculopathy, cervical region.  Balance problems.  THERAPY DIAG:  Cervicalgia  Unsteadiness on feet  Other muscle spasm  Rationale for Evaluation and Treatment: Rehabilitation  ONSET DATE: Ongoing.  SUBJECTIVE:                                                                                                                                                                                                          SUBJECTIVE STATEMENT:  Patient denies any pain today and would like to keep it that way.    PERTINENT HISTORY:  See above.    PAIN:  Are you having pain? No pain at rest.  Moderate pain especially with active left cervical rotation.  PRECAUTIONS: Fall  WEIGHT BEARING RESTRICTIONS: No  FALLS:  Has patient fallen in last 6 months? 5.  Missed step, fell getting into truck and fell from couch.  LIVING ENVIRONMENT: Lives with: lives with their spouse Lives in: House/apartment Has following equipment at home: Single point cane  OCCUPATION: Retired.  PLOF: Independent with basic ADLs and Independent with household mobility with device  PATIENT GOALS: Decrease pain when rotating neck and improve balance. OBJECTIVE:  Note: Objective measures were completed at Evaluation unless otherwise noted.  DIAGNOSTIC FINDINGS:  Moderate spondylotic changes of C5-6, C6-7.     POSTURE: rounded shoulders and forward head  PALPATION: Tender to palpation left of C4-5.   CERVICAL ROM:   Active right cervical rotation is 50 degrees and left is 45 degrees.  UPPER EXTREMITY MMT:  Normal left UE strength.  (Right TSA).  DTR's:  Normal bilateral Bi's/Brach DTR's, bil Tri's diminished.  FUNCTIONAL TESTS:  TUG 17 seconds with straight cane.  5 times sit to stand 18 seconds. Romberg a bit wobbly with finger touch x 2 on table for balance check.   TODAY'S TREATMENT:  DATE:                                                                                           07/28/23 EXERCISE LOG  Exercise Repetitions and Resistance Comments  Nustep  L4 x 15 minutes    Rocker board  5 minutes  BUE support   Cone taps   2 cones; 1-2 HHA; crossing midline  Picking up cones     Marching on foam  2.5 minutes  Intermittent UE support   Static stance on foam  3.5 minutes  Narrow BOS;  intermittent UE support  Tandem Gait on foam 3 mins Forward and backward; BUE support   Side stepping on foam  3 mins 1 HHA   LAQ 4# x 30 reps each    Seated marching  4# x 30 reps each     Blank cell = exercise not performed today                                    07/21/23 EXERCISE LOG  Exercise Repetitions and Resistance Comments  Nustep  L3 x 11 minutes   Marching on foam  3 minutes  Intermittent UE support   Rocker board  5 minutes  Intermittent UE support   Side stepping on foam  15 laps  1 HHA   NBOS walking on foam  10 laps  1 HHA   LAQ 4# x 2 minutes  Alternating LE   Seated marching  4# x 2 x 10 reps each  Alternating LE   Blank cell = exercise not performed             today                                     PATIENT EDUCATION:  Education details: balance, and benefits of exercise Person educated: Patient Education method: Explanation Education comprehension: verbalized understanding  HOME EXERCISE PROGRAM:   ASSESSMENT:  CLINICAL IMPRESSION:  Pt arrives for today's treatment session denying any pain.  Pt able to perform TUG in 14.7 seconds and 5 STS test in 12.6 seconds, meeting both of his goals.  Pt able to demonstrate 65 degrees of right cervical rotation and 55 degrees of left cervical rotation, partially meeting his goal at this time.  Pt able to tolerate increased time or reps with various exercises today.  Pt denied any pain at completion of today's treatment session, but does endorse slight increase in fatigue.    OBJECTIVE IMPAIRMENTS: Abnormal gait, decreased activity tolerance, decreased balance, decreased ROM, increased muscle spasms, postural dysfunction, and pain.   ACTIVITY LIMITATIONS: locomotion level  PARTICIPATION LIMITATIONS: cleaning, community activity, and yard work  PERSONAL FACTORS: 1 comorbidity: per wife, chemo treatments in past affected his balance  are also affecting patient's functional outcome.   REHAB POTENTIAL: Good  CLINICAL  DECISION MAKING: Evolving/moderate complexity  EVALUATION COMPLEXITY: Moderate   GOALS:  LONG TERM GOALS: Target date: 10/05/23  Independent with  a HEP.  Goal status: IN PROGRESS  2.  Improve bilateral active cervical rotation to 60 degrees.   11/19: Right 65 degrees, left 55 degrees Goal status: PARTIALLY MET  3.  Improve TUG to 14-15 seconds.   11/19: 14.7 seconds Goal status: MET  4.  Improve 5 time sit to stand test to 14-15 seconds.   11/19: 12.6 seconds Goal status: MET PLAN:  PT FREQUENCY: 2x/week  PT DURATION: 6 weeks  PLANNED INTERVENTIONS: 97110-Therapeutic exercises, 97530- Therapeutic activity, 97112- Neuromuscular re-education, 97535- Self Care, 81191- Manual therapy, 97014- Electrical stimulation (unattended), 97035- Ultrasound, Patient/Family education, Cryotherapy, and Moist heat  PLAN FOR NEXT SESSION: Postural exercises, STW/M and modalities as needed to affected cervical region.  Gait and balance activities.   Newman Pies, PTA 07/28/2023, 11:47 AM

## 2023-07-31 ENCOUNTER — Ambulatory Visit: Payer: Medicare Other

## 2023-07-31 DIAGNOSIS — R2681 Unsteadiness on feet: Secondary | ICD-10-CM | POA: Diagnosis not present

## 2023-07-31 DIAGNOSIS — M25611 Stiffness of right shoulder, not elsewhere classified: Secondary | ICD-10-CM | POA: Diagnosis not present

## 2023-07-31 DIAGNOSIS — M62838 Other muscle spasm: Secondary | ICD-10-CM | POA: Diagnosis not present

## 2023-07-31 DIAGNOSIS — M542 Cervicalgia: Secondary | ICD-10-CM | POA: Diagnosis not present

## 2023-07-31 NOTE — Therapy (Signed)
OUTPATIENT PHYSICAL THERAPY CERVICAL TREATMENT   Patient Name: Larry Bautista MRN: [id number] DOB:[birth year], 82 y.o., male Today's Date: 07/31/2023  END OF SESSION:  PT End of Session - 07/31/23 1153     Visit Number 7    Number of Visits 12    Date for PT Re-Evaluation 10/05/23    PT Start Time 1140    PT Stop Time 1225    PT Time Calculation (min) 45 min    Activity Tolerance Patient tolerated treatment well    Behavior During Therapy [facility] for tasks assessed/performed                Past Medical History:  Diagnosis Date   Allergy    Arthritis    Blood transfusion without reported diagnosis    Cancer (HCC)    esoph-chemo and rad   CHF (congestive heart failure) (HCC)    Chronic knee pain    Coronary artery disease    with LAD DE stent 2004   Dysplastic nevus 03/26/2016   right mid med calf   Dysrhythmia    Heel spur, left    Hyperlipidemia    Hypertension    Kidney stones    Lower back pain    status post surgery for spondylolisthesis   Migraine    Persistent atrial fibrillation (HCC)    longstanding persistent (since 05/2008)   Plantar fasciitis    Past Surgical History:  Procedure Laterality Date   BACK SURGERY     spondylolisthesis   CARDIAC CATHETERIZATION  03/06/2003   CORONARY ANGIOPLASTY WITH STENT PLACEMENT  04/09/2003   CYPHER stent implantation in the LAD   ESOPHAGOGASTRODUODENOSCOPY (EGD) WITH PROPOFOL N/A 01/07/2018   Procedure: ESOPHAGOGASTRODUODENOSCOPY (EGD) WITH PROPOFOL;  Surgeon: Graylin Shiver, MD;  Location[facility] ENDOSCOPY;  Service: Endoscopy;  Laterality: N/A;   HAND SURGERY Right 09/08/2012   carpal tunnel   HERNIA REPAIR     INGUINAL HERNIA REPAIR Right 04/26/2020   Procedure: RIGHT INGUINAL HERNIA REPAIR WITH MESH;  Surgeon: Abigail Miyamoto, MD;  Location: [facility];  Service: General;  Laterality: Right;   REVERSE SHOULDER ARTHROPLASTY Right 03/26/2023   Procedure: REVERSE SHOULDER ARTHROPLASTY;  Surgeon:  Bjorn Pippin, MD;  Location: [facility];  Service: Orthopedics;  Laterality: Right;   SKIN CANCER EXCISION  05/10/1979   Face   SPINE SURGERY  2000   VASCULAR SURGERY     Vascular Stent   Patient Active Problem List   Diagnosis Date Noted   Gait disorder 01/14/2023   Hematoma of left knee region 08/22/2019   Knee pain 08/21/2019   Osteoarthritis of right shoulder 10/15/2018   Pulmonary nodule 09/30/2018   Adenocarcinoma of gastroesophageal junction (HCC) 01/14/2018   MRSA nasal colonization 01/12/2018   Acute GI bleeding 01/07/2018   Acute blood loss anemia 01/07/2018   COPD with chronic bronchitis (HCC) 12/08/2017   Dyspnea 10/01/2016   Abnormal PFTs 10/01/2016   Hyperglycemia 03/28/2015   Allergic rhinitis 01/24/2015   Arthritis 01/24/2015   Basal cell carcinoma-1990 01/24/2015   BPH (benign prostatic hyperplasia) 01/24/2015   BMI 29.0-29.9,adult 01/24/2015   H/O renal calculi 01/24/2015   Headache, migraine 01/24/2015   AF (paroxysmal atrial fibrillation) (HCC) 01/24/2015   Spinal stenosis 01/24/2015   SPL (spondylolisthesis) 01/24/2015   Benign paroxysmal positional nystagmus 01/24/2015   Encounter for therapeutic drug monitoring 11/02/2013   Hyperlipidemia 05/03/2009   MIGRAINE HEADACHE 05/03/2009   Essential hypertension 05/03/2009   CAD in native artery 05/03/2009  KNEE PAIN 05/03/2009   Backache 05/03/2009   History of tobacco use 05/03/2009    REFERRING PROVIDER: Barnett Abu MD  REFERRING DIAG: Spondylosis without myelopathy or radiculopathy, cervical region.  Balance problems.  THERAPY DIAG:  Cervicalgia  Unsteadiness on feet  Other muscle spasm  Rationale for Evaluation and Treatment: Rehabilitation  ONSET DATE: Ongoing.  SUBJECTIVE:                                                                                                                                                                                                          SUBJECTIVE STATEMENT:  Patient reports that he feels good today and he is not hurting.   PERTINENT HISTORY:  See above.    PAIN:  Are you having pain? No  PRECAUTIONS: Fall  WEIGHT BEARING RESTRICTIONS: No  FALLS:  Has patient fallen in last 6 months? 5.  Missed step, fell getting into truck and fell from couch.  LIVING ENVIRONMENT: Lives with: lives with their spouse Lives in: House/apartment Has following equipment at home: Single point cane  OCCUPATION: Retired.  PLOF: Independent with basic ADLs and Independent with household mobility with device  PATIENT GOALS: Decrease pain when rotating neck and improve balance. OBJECTIVE:  Note: Objective measures were completed at Evaluation unless otherwise noted.  DIAGNOSTIC FINDINGS:  Moderate spondylotic changes of C5-6, C6-7.     POSTURE: rounded shoulders and forward head  PALPATION: Tender to palpation left of C4-5.   CERVICAL ROM:   Active right cervical rotation is 50 degrees and left is 45 degrees.  UPPER EXTREMITY MMT:  Normal left UE strength.  (Right TSA).  DTR's:  Normal bilateral Bi's/Brach DTR's, bil Tri's diminished.  FUNCTIONAL TESTS:  TUG 17 seconds with straight cane.  5 times sit to stand 18 seconds. Romberg a bit wobbly with finger touch x 2 on table for balance check.   TODAY'S TREATMENT:  DATE:                                                                                           07/31/23 EXERCISE LOG  Exercise Repetitions and Resistance Comments  Nustep  L4 x 17 minutes   Rocker board (AP) 5 minutes  BUE support progressing to fingertip support  Marching on foam  2 minutes  Intermittent UE support   Static stance on foam  2 minutes  Eyes closed; intermittent minA for safety  Step ups BOSU (ball up) x 2 minutes  BUE support; alternating LE   Side stepping on  foam 3 minutes 1 HHA   Walking on foam  5 laps   Forward and backward w/ BUE support   Rocker board (lateral)  3 minutes BUE support    Blank cell = exercise not performed today                                    07/28/23 EXERCISE LOG  Exercise Repetitions and Resistance Comments  Nustep  L4 x 15 minutes    Rocker board  5 minutes  BUE support   Cone taps   2 cones; 1-2 HHA; crossing midline  Picking up cones     Marching on foam  2.5 minutes  Intermittent UE support   Static stance on foam  3.5 minutes  Narrow BOS; intermittent UE support  Tandem Gait on foam 3 mins Forward and backward; BUE support   Side stepping on foam  3 mins 1 HHA   LAQ 4# x 30 reps each    Seated marching  4# x 30 reps each     Blank cell = exercise not performed today                                    07/21/23 EXERCISE LOG  Exercise Repetitions and Resistance Comments  Nustep  L3 x 11 minutes   Marching on foam  3 minutes  Intermittent UE support   Rocker board  5 minutes  Intermittent UE support   Side stepping on foam  15 laps  1 HHA   NBOS walking on foam  10 laps  1 HHA   LAQ 4# x 2 minutes  Alternating LE   Seated marching  4# x 2 x 10 reps each  Alternating LE   Blank cell = exercise not performed             today                                     PATIENT EDUCATION:  Education details: balance, and benefits of exercise Person educated: Patient Education method: Explanation Education comprehension: verbalized understanding  HOME EXERCISE PROGRAM:   ASSESSMENT:  CLINICAL IMPRESSION:  Patient was progressed with step ups onto the BOSU in addition to other familiar interventions for improved safety on unstable  terrain. He required minimal cueing with today's standing interventions for reduced upper extremity support to facilitate improved lower extremity demand. However, he required external support with most of these interventions. He reported feeling "a little tired" upon the conclusion  of treatment. He continues to require skilled physical therapy to address his remaining impairments to maximize his safety and functional mobility.   OBJECTIVE IMPAIRMENTS: Abnormal gait, decreased activity tolerance, decreased balance, decreased ROM, increased muscle spasms, postural dysfunction, and pain.   ACTIVITY LIMITATIONS: locomotion level  PARTICIPATION LIMITATIONS: cleaning, community activity, and yard work  PERSONAL FACTORS: 1 comorbidity: per wife, chemo treatments in past affected his balance  are also affecting patient's functional outcome.   REHAB POTENTIAL: Good  CLINICAL DECISION MAKING: Evolving/moderate complexity  EVALUATION COMPLEXITY: Moderate   GOALS:  LONG TERM GOALS: Target date: 10/05/23  Independent with a HEP.  Goal status: IN PROGRESS  2.  Improve bilateral active cervical rotation to 60 degrees.   11/19: Right 65 degrees, left 55 degrees Goal status: PARTIALLY MET  3.  Improve TUG to 14-15 seconds.   11/19: 14.7 seconds Goal status: MET  4.  Improve 5 time sit to stand test to 14-15 seconds.   11/19: 12.6 seconds Goal status: MET PLAN:  PT FREQUENCY: 2x/week  PT DURATION: 6 weeks  PLANNED INTERVENTIONS: 97110-Therapeutic exercises, 97530- Therapeutic activity, 97112- Neuromuscular re-education, 97535- Self Care, 16109- Manual therapy, 97014- Electrical stimulation (unattended), 97035- Ultrasound, Patient/Family education, Cryotherapy, and Moist heat  PLAN FOR NEXT SESSION: Postural exercises, STW/M and modalities as needed to affected cervical region.  Gait and balance activities.   Granville Lewis, PT 07/31/2023, 12:41 PM

## 2023-08-04 ENCOUNTER — Ambulatory Visit: Payer: Medicare Other

## 2023-08-04 DIAGNOSIS — M542 Cervicalgia: Secondary | ICD-10-CM | POA: Diagnosis not present

## 2023-08-04 DIAGNOSIS — M25611 Stiffness of right shoulder, not elsewhere classified: Secondary | ICD-10-CM | POA: Diagnosis not present

## 2023-08-04 DIAGNOSIS — R2681 Unsteadiness on feet: Secondary | ICD-10-CM | POA: Diagnosis not present

## 2023-08-04 DIAGNOSIS — M62838 Other muscle spasm: Secondary | ICD-10-CM | POA: Diagnosis not present

## 2023-08-04 NOTE — Therapy (Signed)
OUTPATIENT PHYSICAL THERAPY CERVICAL TREATMENT   Patient Name: Larry Bautista MRN: [id number] DOB:[birth year], 82 y.o., male Today's Date: 08/04/2023  END OF SESSION:  PT End of Session - 08/04/23 1101     Visit Number 8    Number of Visits 12    Date for PT Re-Evaluation 10/05/23    PT Start Time 1100    PT Stop Time 1136    PT Time Calculation (min) 36 min    Activity Tolerance Patient tolerated treatment well    Behavior During Therapy [facility] for tasks assessed/performed                Past Medical History:  Diagnosis Date   Allergy    Arthritis    Blood transfusion without reported diagnosis    Cancer (HCC)    esoph-chemo and rad   CHF (congestive heart failure) (HCC)    Chronic knee pain    Coronary artery disease    with LAD DE stent 2004   Dysplastic nevus 03/26/2016   right mid med calf   Dysrhythmia    Heel spur, left    Hyperlipidemia    Hypertension    Kidney stones    Lower back pain    status post surgery for spondylolisthesis   Migraine    Persistent atrial fibrillation (HCC)    longstanding persistent (since 05/2008)   Plantar fasciitis    Past Surgical History:  Procedure Laterality Date   BACK SURGERY     spondylolisthesis   CARDIAC CATHETERIZATION  03/06/2003   CORONARY ANGIOPLASTY WITH STENT PLACEMENT  04/09/2003   CYPHER stent implantation in the LAD   ESOPHAGOGASTRODUODENOSCOPY (EGD) WITH PROPOFOL N/A 01/07/2018   Procedure: ESOPHAGOGASTRODUODENOSCOPY (EGD) WITH PROPOFOL;  Surgeon: Graylin Shiver, MD;  Location: [facility] ENDOSCOPY;  Service: Endoscopy;  Laterality: N/A;   HAND SURGERY Right 09/08/2012   carpal tunnel   HERNIA REPAIR     INGUINAL HERNIA REPAIR Right 04/26/2020   Procedure: RIGHT INGUINAL HERNIA REPAIR WITH MESH;  Surgeon: Abigail Miyamoto, MD;  Location: [facility];  Service: General;  Laterality: Right;   REVERSE SHOULDER ARTHROPLASTY Right 03/26/2023   Procedure: REVERSE SHOULDER ARTHROPLASTY;  Surgeon:  Bjorn Pippin, MD;  Location: [facility];  Service: Orthopedics;  Laterality: Right;   SKIN CANCER EXCISION  05/10/1979   Face   SPINE SURGERY  2000   VASCULAR SURGERY     Vascular Stent   Patient Active Problem List   Diagnosis Date Noted   Gait disorder 01/14/2023   Hematoma of left knee region 08/22/2019   Knee pain 08/21/2019   Osteoarthritis of right shoulder 10/15/2018   Pulmonary nodule 09/30/2018   Adenocarcinoma of gastroesophageal junction (HCC) 01/14/2018   MRSA nasal colonization 01/12/2018   Acute GI bleeding 01/07/2018   Acute blood loss anemia 01/07/2018   COPD with chronic bronchitis (HCC) 12/08/2017   Dyspnea 10/01/2016   Abnormal PFTs 10/01/2016   Hyperglycemia 03/28/2015   Allergic rhinitis 01/24/2015   Arthritis 01/24/2015   Basal cell carcinoma-1990 01/24/2015   BPH (benign prostatic hyperplasia) 01/24/2015   BMI 29.0-29.9,adult 01/24/2015   H/O renal calculi 01/24/2015   Headache, migraine 01/24/2015   AF (paroxysmal atrial fibrillation) (HCC) 01/24/2015   Spinal stenosis 01/24/2015   SPL (spondylolisthesis) 01/24/2015   Benign paroxysmal positional nystagmus 01/24/2015   Encounter for therapeutic drug monitoring 11/02/2013   Hyperlipidemia 05/03/2009   MIGRAINE HEADACHE 05/03/2009   Essential hypertension 05/03/2009   CAD in native artery 05/03/2009  KNEE PAIN 05/03/2009   Backache 05/03/2009   History of tobacco use 05/03/2009    REFERRING PROVIDER: Barnett Abu MD  REFERRING DIAG: Spondylosis without myelopathy or radiculopathy, cervical region.  Balance problems.  THERAPY DIAG:  Cervicalgia  Unsteadiness on feet  Stiffness of right shoulder, not elsewhere classified  Rationale for Evaluation and Treatment: Rehabilitation  ONSET DATE: Ongoing.  SUBJECTIVE:                                                                                                                                                                                                          SUBJECTIVE STATEMENT:  Patient reports that he feels good today and he is not hurting.  Pt plans to go on hold today.   PERTINENT HISTORY:  See above.    PAIN:  Are you having pain? No  PRECAUTIONS: Fall  WEIGHT BEARING RESTRICTIONS: No  FALLS:  Has patient fallen in last 6 months? 5.  Missed step, fell getting into truck and fell from couch.  LIVING ENVIRONMENT: Lives with: lives with their spouse Lives in: House/apartment Has following equipment at home: Single point cane  OCCUPATION: Retired.  PLOF: Independent with basic ADLs and Independent with household mobility with device  PATIENT GOALS: Decrease pain when rotating neck and improve balance. OBJECTIVE:  Note: Objective measures were completed at Evaluation unless otherwise noted.  DIAGNOSTIC FINDINGS:  Moderate spondylotic changes of C5-6, C6-7.     POSTURE: rounded shoulders and forward head  PALPATION: Tender to palpation left of C4-5.   CERVICAL ROM:   Active right cervical rotation is 50 degrees and left is 45 degrees.  UPPER EXTREMITY MMT:  Normal left UE strength.  (Right TSA).  DTR's:  Normal bilateral Bi's/Brach DTR's, bil Tri's diminished.  FUNCTIONAL TESTS:  TUG 17 seconds with straight cane.  5 times sit to stand 18 seconds. Romberg a bit wobbly with finger touch x 2 on table for balance check.   TODAY'S TREATMENT:  DATE:                                                                                           08/04/23 EXERCISE LOG  Exercise Repetitions and Resistance Comments  Nustep  L4 x 17 minutes   Rocker board (AP) 5 minutes  BUE support progressing to fingertip support  Marching on foam  3 minutes  Intermittent UE support   Static stance on foam  2 minutes  Eyes closed; intermittent minA for safety  Step ups  BUE support;  alternating LE   Side stepping on foam  1 HHA   Walking on foam   Forward and backward w/ BUE support   Rocker board (lateral)  3 minutes BUE support    Blank cell = exercise not performed today                                    07/28/23 EXERCISE LOG  Exercise Repetitions and Resistance Comments  Nustep  L4 x 15 minutes    Rocker board  5 minutes  BUE support   Cone taps   2 cones; 1-2 HHA; crossing midline  Picking up cones     Marching on foam  2.5 minutes  Intermittent UE support   Static stance on foam  3.5 minutes  Narrow BOS; intermittent UE support  Tandem Gait on foam 3 mins Forward and backward; BUE support   Side stepping on foam  3 mins 1 HHA   LAQ 4# x 30 reps each    Seated marching  4# x 30 reps each     Blank cell = exercise not performed today                                    07/21/23 EXERCISE LOG  Exercise Repetitions and Resistance Comments  Nustep  L3 x 11 minutes   Marching on foam  3 minutes  Intermittent UE support   Rocker board  5 minutes  Intermittent UE support   Side stepping on foam  15 laps  1 HHA   NBOS walking on foam  10 laps  1 HHA   LAQ 4# x 2 minutes  Alternating LE   Seated marching  4# x 2 x 10 reps each  Alternating LE   Blank cell = exercise not performed             today                                     PATIENT EDUCATION:  Education details: balance, and benefits of exercise Person educated: Patient Education method: Explanation Education comprehension: verbalized understanding  HOME EXERCISE PROGRAM:   ASSESSMENT:  CLINICAL IMPRESSION:  Pt arrives for today's treatment session denying any pain. Pt able to demonstrate 58 degrees of left cervical rotation making further progress towards his goal.  Pt able to tolerate  increased time with various balance exercises today.  Pt wishes to go on hold at this time and plans to call the facility in a few weeks with his plans.  Pt denied any pain at completion of today's treatment  session.   OBJECTIVE IMPAIRMENTS: Abnormal gait, decreased activity tolerance, decreased balance, decreased ROM, increased muscle spasms, postural dysfunction, and pain.   ACTIVITY LIMITATIONS: locomotion level  PARTICIPATION LIMITATIONS: cleaning, community activity, and yard work  PERSONAL FACTORS: 1 comorbidity: per wife, chemo treatments in past affected his balance  are also affecting patient's functional outcome.   REHAB POTENTIAL: Good  CLINICAL DECISION MAKING: Evolving/moderate complexity  EVALUATION COMPLEXITY: Moderate   GOALS:  LONG TERM GOALS: Target date: 10/05/23  Independent with a HEP.  Goal status: MET  2.  Improve bilateral active cervical rotation to 60 degrees.   11/19: Right 65 degrees, left 55 degrees; 11/26: 58 degrees on left Goal status: PARTIALLY MET  3.  Improve TUG to 14-15 seconds.   11/19: 14.7 seconds Goal status: MET  4.  Improve 5 time sit to stand test to 14-15 seconds.   11/19: 12.6 seconds Goal status: MET PLAN:  PT FREQUENCY: 2x/week  PT DURATION: 6 weeks  PLANNED INTERVENTIONS: 97110-Therapeutic exercises, 97530- Therapeutic activity, 97112- Neuromuscular re-education, 97535- Self Care, 08657- Manual therapy, 97014- Electrical stimulation (unattended), 97035- Ultrasound, Patient/Family education, Cryotherapy, and Moist heat  PLAN FOR NEXT SESSION: Postural exercises, STW/M and modalities as needed to affected cervical region.  Gait and balance activities.   Newman Pies, PTA 08/04/2023, 11:48 AM

## 2023-08-19 ENCOUNTER — Ambulatory Visit: Payer: Medicare Other | Attending: Cardiology | Admitting: *Deleted

## 2023-08-19 DIAGNOSIS — Z5181 Encounter for therapeutic drug level monitoring: Secondary | ICD-10-CM | POA: Diagnosis not present

## 2023-08-19 DIAGNOSIS — I480 Paroxysmal atrial fibrillation: Secondary | ICD-10-CM | POA: Diagnosis not present

## 2023-08-19 LAB — POCT INR: INR: 2 (ref 2.0–3.0)

## 2023-08-19 NOTE — Patient Instructions (Addendum)
Description   Tomorrow take 2.5mg  of warfarin then continue taking Warfarin 1.25mg  daily except  2.5mg  on Mondays, Wednesdays and Fridays. Recheck INR in 5 weeks.   Call with any new medications or procedure/surgery dates. [facility] [phone].  Fax number for shoulder procedure clearance [phone].

## 2023-09-07 ENCOUNTER — Other Ambulatory Visit: Payer: Self-pay | Admitting: Family Medicine

## 2023-10-01 ENCOUNTER — Ambulatory Visit: Payer: Medicare Other | Attending: Internal Medicine | Admitting: *Deleted

## 2023-10-01 DIAGNOSIS — I480 Paroxysmal atrial fibrillation: Secondary | ICD-10-CM | POA: Insufficient documentation

## 2023-10-01 DIAGNOSIS — Z5181 Encounter for therapeutic drug level monitoring: Secondary | ICD-10-CM | POA: Diagnosis not present

## 2023-10-01 LAB — POCT INR: INR: 2.3 (ref 2.0–3.0)

## 2023-10-01 NOTE — Patient Instructions (Signed)
Description   Continue taking Warfarin 1.25mg  daily except  2.5mg  on Mondays, Wednesdays and Fridays. Recheck INR in 6 weeks.   Call with any new medications or procedure/surgery dates. [facility] [phone].  Fax number for shoulder procedure clearance [phone].

## 2023-10-15 ENCOUNTER — Ambulatory Visit: Payer: Medicare Other | Admitting: Family

## 2023-10-15 ENCOUNTER — Encounter: Payer: Self-pay | Admitting: Family

## 2023-10-15 VITALS — BP 138/85 | HR 76 | Temp 97.4°F | Ht 67.0 in | Wt 176.8 lb

## 2023-10-15 DIAGNOSIS — J069 Acute upper respiratory infection, unspecified: Secondary | ICD-10-CM

## 2023-10-15 MED ORDER — PREDNISONE 20 MG PO TABS: ORAL_TABLET | ORAL | 0 refills | Status: DC

## 2023-10-15 NOTE — Patient Instructions (Signed)
 It was very nice to see you today!   I have sent over prednisone  to start today after eating, take as soon as you can as it may cause difficulty sleeping. This medicine will help you feel better and stop coughing sooner. You can continue the Dimetapp cough syrup. Ok to take Tylenol  as needed for chest pain or headaches due to the coughing. Drink plenty of water! Restart your Flonase  nasal spray daily to help with your nasal congestion and any other nasal symptoms until your symptoms are better.  If you are not better by next week call the office.       PLEASE NOTE:  If you had any lab tests please let us  know if you have not heard back within a few days. You may see your results on MyChart before we have a chance to review them but we will give you a call once they are reviewed by us . If we ordered any referrals today, please let us  know if you have not heard from their office within the next week.

## 2023-10-15 NOTE — Progress Notes (Signed)
 Patient ID: Larry Bautista, male    DOB: 09/20/1940, 83 y.o.   MRN: [id number]  Chief Complaint  Patient presents with   Cough    Pt c/o cough, fever of 100 2 days ago and chest congestion. Present for 5 days. Has tried OTC cold/flu which did not help.        Discussed the use of AI scribe software for clinical note transcription with the patient, who gave verbal consent to proceed.  History of Present Illness   Larry Bautista is an 83 year old male with atrial fibrillation on daily Coumadin  who presents with cough and chest pain. He has been experiencing a cough and chest pain since the weekend, with symptoms worsening over the last two days. The pain occurs across his back when he coughs. He has been using Dimetapp syrup for relief but has not tried any other over-the-counter medications like Mucinex or lozenges. Mucus is produced with the cough, which is clear in color. He mentions having a fever a couple of days ago, but no fever today or yesterday. The cough has slightly improved compared to the last couple of days. No history of COPD and he does not use inhalers. He has not used Flonase  recently but has used it in the past for sinus symptoms. He received a flu shot this year.     Assessment & Plan:     Respiratory Infection - Coughing with clear mucus production, chest pain, and back pain when coughing. No fever. No history of COPD. Lungs are congested on exam. -Prescribing Prednisone  dose pack, pt advised on use & SE. -Continue Dimetapp cough syrup prn and take Tylenol  prn for rib/chest pain. -Increase water intake to 2 liters daily. -Advised patient to consider purchasing a home test kit for flu and COVID-19 for future use and can schedule virtual visit if positive for tx. -Call the office Monday if cough is no better.     Subjective:    Outpatient Medications Prior to Visit  Medication Sig Dispense Refill   cetirizine  (ZYRTEC ) 10 MG tablet Take 1 tablet (10 mg total) by  mouth daily. 90 tablet 3   diphenhydramine -acetaminophen  (TYLENOL  PM) 25-500 MG TABS tablet Take 1 tablet by mouth at bedtime as needed.     eplerenone  (INSPRA ) 25 MG tablet Take 1 tablet (25 mg total) by mouth every Monday, Wednesday, and Friday. 45 tablet 3   fluticasone  (FLONASE ) 50 MCG/ACT nasal spray USE 2 SPRAYS IN EACH NOSTRIL DAILY 48 g 0   gabapentin  (NEURONTIN ) 100 MG capsule Take 100 mg by mouth daily.     nitroGLYCERIN  (NITROSTAT ) 0.4 MG SL tablet DISSOLVE 1 TAB UNDER TOUNGE FOR CHEST PAIN. MAY REPEAT EVERY 5 MINUTES FOR 3 DOSES. IF NO RELIEF CALL 911 OR GO TO ER 25 tablet 1   psyllium (METAMUCIL) 58.6 % powder Take 1 packet by mouth daily.      rosuvastatin  (CRESTOR ) 20 MG tablet TAKE ONE (1) TABLET BY MOUTH EVERY DAY 90 tablet 2   tamsulosin  (FLOMAX ) 0.4 MG CAPS capsule Take 0.4 mg by mouth daily after supper.     tiZANidine (ZANAFLEX) 4 MG tablet Take 4 mg by mouth daily.     vitamin C  (ASCORBIC ACID ) 500 MG tablet Take 500 mg by mouth daily.     warfarin (COUMADIN ) 2.5 MG tablet TAKE 1/2 TABLET ON SUNDAY, TUESDAY, THURSDAY, & SATURDAY OR AS DIRECTED 25 tablet 3   warfarin (COUMADIN ) 5 MG tablet TAKE 1/2 TABLET ON MONDAY,  WEDNESDAY, AND FRIDAY 30 tablet 1   diclofenac  Sodium (VOLTAREN ) 1 % GEL Apply topically 4 (four) times daily. (Patient not taking: Reported on 03/30/2023)     No facility-administered medications prior to visit.   Past Medical History:  Diagnosis Date   Allergy     Arthritis    Blood transfusion without reported diagnosis    Cancer (HCC)    esoph-chemo and rad   CHF (congestive heart failure) (HCC)    Chronic knee pain    Coronary artery disease    with LAD DE stent 2004   Dysplastic nevus 03/26/2016   right mid med calf   Dysrhythmia    Heel spur, left    Hyperlipidemia    Hypertension    Kidney stones    Lower back pain    status post surgery for spondylolisthesis   Migraine    Persistent atrial fibrillation (HCC)    longstanding persistent  (since 05/2008)   Plantar fasciitis    Past Surgical History:  Procedure Laterality Date   BACK SURGERY     spondylolisthesis   CARDIAC CATHETERIZATION  03/06/2003   CORONARY ANGIOPLASTY WITH STENT PLACEMENT  04/09/2003   CYPHER stent implantation in the LAD   ESOPHAGOGASTRODUODENOSCOPY (EGD) WITH PROPOFOL  N/A 01/07/2018   Procedure: ESOPHAGOGASTRODUODENOSCOPY (EGD) WITH PROPOFOL ;  Surgeon: Lennard Lesta FALCON, MD;  Location: [facility] ENDOSCOPY;  Service: Endoscopy;  Laterality: N/A;   HAND SURGERY Right 09/08/2012   carpal tunnel   HERNIA REPAIR     INGUINAL HERNIA REPAIR Right 04/26/2020   Procedure: RIGHT INGUINAL HERNIA REPAIR WITH MESH;  Surgeon: Vernetta Berg, MD;  Location: [facility];  Service: General;  Laterality: Right;   REVERSE SHOULDER ARTHROPLASTY Right 03/26/2023   Procedure: REVERSE SHOULDER ARTHROPLASTY;  Surgeon: Cristy Bonner DASEN, MD;  Location: [facility];  Service: Orthopedics;  Laterality: Right;   SKIN CANCER EXCISION  05/10/1979   Face   SPINE SURGERY  2000   VASCULAR SURGERY     Vascular Stent   Allergies  Allergen Reactions   Celecoxib Rash   Lisinopril  Cough    Other Reaction(s): cough   Simvastatin Other (See Comments)    Leg pain      Objective:    Physical Exam Vitals and nursing note reviewed.  Constitutional:      General: He is not in acute distress.    Appearance: Normal appearance.  HENT:     Head: Normocephalic.     Right Ear: Tympanic membrane and ear canal normal.     Left Ear: Tympanic membrane and ear canal normal.     Nose:     Right Sinus: No maxillary sinus tenderness or frontal sinus tenderness.     Left Sinus: No maxillary sinus tenderness or frontal sinus tenderness.     Mouth/Throat:     Mouth: Mucous membranes are moist.     Pharynx: No pharyngeal swelling, oropharyngeal exudate, posterior oropharyngeal erythema or uvula swelling.     Tonsils: No tonsillar exudate or tonsillar abscesses.   Cardiovascular:     Rate and Rhythm: Normal rate and regular rhythm.  Pulmonary:     Effort: Pulmonary effort is normal.     Breath sounds: Examination of the right-upper field reveals rhonchi. Examination of the left-upper field reveals rhonchi. Examination of the right-middle field reveals rhonchi. Examination of the left-middle field reveals rhonchi. Rhonchi present.  Musculoskeletal:        General: Normal range of motion.     Cervical back:  Normal range of motion.  Lymphadenopathy:     Head:     Right side of head: No preauricular or posterior auricular adenopathy.     Left side of head: No preauricular or posterior auricular adenopathy.     Cervical: No cervical adenopathy.  Skin:    General: Skin is warm and dry.  Neurological:     Mental Status: He is alert and oriented to person, place, and time.  Psychiatric:        Mood and Affect: Mood normal.    BP 138/85 (BP Location: Left Arm, Patient Position: Sitting, Cuff Size: Large)   Pulse 76   Temp (!) 97.4 F (36.3 C) (Temporal)   Ht 5' 7 (1.702 m)   Wt 176 lb 12.8 oz (80.2 kg)   SpO2 98%   BMI 27.69 kg/m  Wt Readings from Last 3 Encounters:  10/15/23 176 lb 12.8 oz (80.2 kg)  03/26/23 176 lb 2.4 oz (79.9 kg)  02/19/23 179 lb (81.2 kg)      Lucius Krabbe, NP

## 2023-11-10 ENCOUNTER — Other Ambulatory Visit: Payer: Self-pay | Admitting: Family Medicine

## 2023-11-12 ENCOUNTER — Ambulatory Visit: Payer: Medicare Other | Attending: Cardiology

## 2023-11-12 DIAGNOSIS — Z5181 Encounter for therapeutic drug level monitoring: Secondary | ICD-10-CM | POA: Insufficient documentation

## 2023-11-12 DIAGNOSIS — I480 Paroxysmal atrial fibrillation: Secondary | ICD-10-CM | POA: Insufficient documentation

## 2023-11-12 LAB — POCT INR: INR: 3 (ref 2.0–3.0)

## 2023-11-12 NOTE — Patient Instructions (Signed)
 Description   Continue taking Warfarin 1.25mg  daily except  2.5mg  on Mondays, Wednesdays and Fridays. Recheck INR in 6 weeks.   Call with any new medications or procedure/surgery dates. [facility] [phone].  Fax number for shoulder procedure clearance [phone].

## 2023-12-16 ENCOUNTER — Ambulatory Visit: Payer: Medicare Other | Admitting: Dermatology

## 2023-12-24 ENCOUNTER — Ambulatory Visit: Attending: Cardiology

## 2023-12-24 DIAGNOSIS — Z5181 Encounter for therapeutic drug level monitoring: Secondary | ICD-10-CM | POA: Diagnosis not present

## 2023-12-24 DIAGNOSIS — I480 Paroxysmal atrial fibrillation: Secondary | ICD-10-CM | POA: Diagnosis not present

## 2023-12-24 LAB — POCT INR: INR: 3.1 — AB (ref 2.0–3.0)

## 2023-12-24 NOTE — Patient Instructions (Signed)
 Description   Eat greens today and Continue taking Warfarin 1.25mg  daily except  2.5mg  on Mondays, Wednesdays and Fridays. Recheck INR in 6 weeks.   Call with any new medications or procedure/surgery dates. [facility] [phone].  Fax number for shoulder procedure clearance [phone].

## 2023-12-29 ENCOUNTER — Encounter: Payer: Self-pay | Admitting: Dermatology

## 2023-12-29 ENCOUNTER — Ambulatory Visit (INDEPENDENT_AMBULATORY_CARE_PROVIDER_SITE_OTHER): Admitting: Dermatology

## 2023-12-29 DIAGNOSIS — Z7189 Other specified counseling: Secondary | ICD-10-CM | POA: Diagnosis not present

## 2023-12-29 DIAGNOSIS — L821 Other seborrheic keratosis: Secondary | ICD-10-CM | POA: Diagnosis not present

## 2023-12-29 DIAGNOSIS — L814 Other melanin hyperpigmentation: Secondary | ICD-10-CM

## 2023-12-29 DIAGNOSIS — L578 Other skin changes due to chronic exposure to nonionizing radiation: Secondary | ICD-10-CM

## 2023-12-29 DIAGNOSIS — Z86018 Personal history of other benign neoplasm: Secondary | ICD-10-CM

## 2023-12-29 DIAGNOSIS — B351 Tinea unguium: Secondary | ICD-10-CM

## 2023-12-29 DIAGNOSIS — Z1283 Encounter for screening for malignant neoplasm of skin: Secondary | ICD-10-CM | POA: Diagnosis not present

## 2023-12-29 DIAGNOSIS — D692 Other nonthrombocytopenic purpura: Secondary | ICD-10-CM | POA: Diagnosis not present

## 2023-12-29 DIAGNOSIS — D229 Melanocytic nevi, unspecified: Secondary | ICD-10-CM

## 2023-12-29 DIAGNOSIS — I872 Venous insufficiency (chronic) (peripheral): Secondary | ICD-10-CM

## 2023-12-29 DIAGNOSIS — I781 Nevus, non-neoplastic: Secondary | ICD-10-CM | POA: Diagnosis not present

## 2023-12-29 DIAGNOSIS — L820 Inflamed seborrheic keratosis: Secondary | ICD-10-CM

## 2023-12-29 DIAGNOSIS — W908XXA Exposure to other nonionizing radiation, initial encounter: Secondary | ICD-10-CM

## 2023-12-29 DIAGNOSIS — L719 Rosacea, unspecified: Secondary | ICD-10-CM | POA: Diagnosis not present

## 2023-12-29 DIAGNOSIS — Z79899 Other long term (current) drug therapy: Secondary | ICD-10-CM

## 2023-12-29 DIAGNOSIS — D1801 Hemangioma of skin and subcutaneous tissue: Secondary | ICD-10-CM

## 2023-12-29 DIAGNOSIS — Z85828 Personal history of other malignant neoplasm of skin: Secondary | ICD-10-CM

## 2023-12-29 MED ORDER — CICLOPIROX 8 % EX SOLN: Freq: Every day | CUTANEOUS | 11 refills | Status: AC

## 2023-12-29 NOTE — Patient Instructions (Signed)

## 2023-12-29 NOTE — Progress Notes (Signed)
 Follow-Up Visit   Subjective  Larry Bautista is a 83 y.o. male who presents for the following: Skin Cancer Screening and Full Body Skin Exam hx of BCC, Dysplastic Nevus,  check spot L jaw, 6-67m, no symptoms  The patient presents for Total-Body Skin Exam (TBSE) for skin cancer screening and mole check. The patient has spots, moles and lesions to be evaluated, some may be new or changing and the patient may have concern these could be cancer.  The following portions of the chart were reviewed this encounter and updated as appropriate: medications, allergies, medical history  Review of Systems:  No other skin or systemic complaints except as noted in HPI or Assessment and Plan.  Objective  Well appearing patient in no apparent distress; mood and affect are within normal limits.  A full examination was performed including scalp, head, eyes, ears, nose, lips, neck, chest, axillae, abdomen, back, buttocks, bilateral upper extremities, bilateral lower extremities, hands, feet, fingers, toes, fingernails, and toenails. All findings within normal limits unless otherwise noted below.   Relevant physical exam findings are noted in the Assessment and Plan.  L mandible x 1, L medial thigh x 1 (2) Stuck on waxy paps with erythema  Assessment & Plan   SKIN CANCER SCREENING PERFORMED TODAY.  ACTINIC DAMAGE - Chronic condition, secondary to cumulative UV/sun exposure - diffuse scaly erythematous macules with underlying dyspigmentation - Recommend daily broad spectrum sunscreen SPF 30+ to sun-exposed areas, reapply every 2 hours as needed.  - Staying in the shade or wearing long sleeves, sun glasses (UVA+UVB protection) and wide brim hats (4-inch brim around the entire circumference of the hat) are also recommended for sun protection.  - Call for new or changing lesions.  LENTIGINES, SEBORRHEIC KERATOSES, HEMANGIOMAS - Benign normal skin lesions - Benign-appearing - Call for any  changes  MELANOCYTIC NEVI - Tan-brown and/or pink-flesh-colored symmetric macules and papules - Benign appearing on exam today - Observation - Call clinic for new or changing moles - Recommend daily use of broad spectrum spf 30+ sunscreen to sun-exposed areas.   HISTORY OF DYSPLASTIC NEVUS No evidence of recurrence today Recommend regular full body skin exams Recommend daily broad spectrum sunscreen SPF 30+ to sun-exposed areas, reapply every 2 hours as needed.  Call if any new or changing lesions are noted between office visits  - R mid medial calf  HISTORY OF BASAL CELL CARCINOMA OF THE SKIN - No evidence of recurrence today - Recommend regular full body skin exams - Recommend daily broad spectrum sunscreen SPF 30+ to sun-exposed areas, reapply every 2 hours as needed.  - Call if any new or changing lesions are noted between office visits  - L face  ROSACEA with TELANGIECTASIAS face Exam Mid face erythema with telangiectasias  Chronic and persistent condition with duration or expected duration over one year. Condition is symptomatic / bothersome to patient. Not to goal. Rosacea is a chronic progressive skin condition usually affecting the face of adults, causing redness and/or acne bumps. It is treatable but not curable. It sometimes affects the eyes (ocular rosacea) as well. It may respond to topical and/or systemic medication and can flare with stress, sun exposure, alcohol , exercise, topical steroids (including hydrocortisone/cortisone 10) and some foods.  Daily application of broad spectrum spf 30+ sunscreen to face is recommended to reduce flares.  Patient denies grittiness of the eyes  Treatment Plan Counseling for BBL / IPL / Laser and Coordination of Care Discussed the treatment option of Broad  Band Light (BBL) /Intense Pulsed Light (IPL)/ Laser for skin discoloration, including brown spots and redness.  Typically we recommend at least 1-3 treatment sessions about 5-8  weeks apart for best results.  Cannot have tanned skin when BBL performed, and regular use of sunscreen/photoprotection is advised after the procedure to help maintain results. The patient's condition may also require "maintenance treatments" in the future.  The fee for BBL / laser treatments is $350 per treatment session for the whole face.  A fee can be quoted for other parts of the body.  Insurance typically does not pay for BBL/laser treatments and therefore the fee is an out-of-pocket cost. Recommend prophylactic valtrex treatment. Once scheduled for procedure, will send Rx in prior to patient's appointment.   Purpura - Chronic; persistent and recurrent.  Treatable, but not curable. arms - Violaceous macules and patches - Benign - Related to trauma, age, sun damage and/or use of blood thinners, chronic use of topical and/or oral steroids - Observe - Can use OTC arnica containing moisturizer such as Dermend Bruise Formula if desired - Call for worsening or other concerns  STASIS DERMATITIS with SCHAMBERG'S PURPURA Lower leg Exam: Erythematous, scaly patches involving the ankle and distal lower leg with associated lower leg edema. Chronic and persistent condition with duration or expected duration over one year. Condition is symptomatic / bothersome to patient. Not to goal. Stasis in the legs causes chronic leg swelling, which may result in itchy or painful rashes, skin discoloration, skin texture changes, and sometimes ulceration.  Recommend daily graduated compression hose/stockings- easiest to put on first thing in morning, remove at bedtime.  Elevate legs as much as possible. Avoid salt/sodium rich foods. Treatment Plan: No treatment at this time  TINEA UNGUIUM Exam: Thickened toenails with subungal debris c/w onychomycosis Chronic and persistent condition with duration or expected duration over one year. Condition is symptomatic/ bothersome to patient. Not currently at goal. Treatment  Plan: Start Penlac  Nail Lacquer qhs   INFLAMED SEBORRHEIC KERATOSIS (2) L mandible x 1, L medial thigh x 1 (2) Symptomatic, irritating, patient would like treated. Destruction of lesion - L mandible x 1, L medial thigh x 1 (2) Complexity: simple   Destruction method: cryotherapy   Informed consent: discussed and consent obtained   Timeout:  patient name, date of birth, surgical site, and procedure verified Lesion destroyed using liquid nitrogen: Yes   Region frozen until ice ball extended beyond lesion: Yes   Outcome: patient tolerated procedure well with no complications   Post-procedure details: wound care instructions given   Return in about 1 year (around 12/28/2024) for TBSE, Hx of BCC, Hx of Dysplastic nevi.  I, Rollie Clipper, RMA, am acting as scribe for Celine Collard, MD .   Documentation: I have reviewed the above documentation for accuracy and completeness, and I agree with the above.  Celine Collard, MD

## 2024-01-04 ENCOUNTER — Other Ambulatory Visit: Payer: Self-pay

## 2024-01-04 ENCOUNTER — Emergency Department (HOSPITAL_COMMUNITY)

## 2024-01-04 ENCOUNTER — Emergency Department (HOSPITAL_COMMUNITY)
Admission: EM | Admit: 2024-01-04 | Discharge: 2024-01-04 | Disposition: A | Discharge status: Home / Self Care | Attending: Emergency Medicine | Admitting: Emergency Medicine

## 2024-01-04 ENCOUNTER — Encounter (HOSPITAL_COMMUNITY): Payer: Self-pay | Admitting: Emergency Medicine

## 2024-01-04 DIAGNOSIS — M11262 Other chondrocalcinosis, left knee: Secondary | ICD-10-CM | POA: Diagnosis not present

## 2024-01-04 DIAGNOSIS — M25462 Effusion, left knee: Secondary | ICD-10-CM | POA: Diagnosis not present

## 2024-01-04 DIAGNOSIS — Z7901 Long term (current) use of anticoagulants: Secondary | ICD-10-CM | POA: Insufficient documentation

## 2024-01-04 DIAGNOSIS — M25562 Pain in left knee: Secondary | ICD-10-CM | POA: Diagnosis not present

## 2024-01-04 DIAGNOSIS — R609 Edema, unspecified: Secondary | ICD-10-CM | POA: Diagnosis not present

## 2024-01-04 DIAGNOSIS — Z049 Encounter for examination and observation for unspecified reason: Secondary | ICD-10-CM | POA: Diagnosis not present

## 2024-01-04 LAB — SYNOVIAL CELL COUNT + DIFF, W/ CRYSTALS
Eosinophils-Synovial: 0 % (ref 0–1)
Lymphocytes-Synovial Fld: 7 % (ref 0–20)
Monocyte-Macrophage-Synovial Fluid: 3 % — ABNORMAL LOW (ref 50–90)
Neutrophil, Synovial: 90 % — ABNORMAL HIGH (ref 0–25)
WBC, Synovial: 11000 /mm3 — ABNORMAL HIGH (ref 0–200)

## 2024-01-04 LAB — CBC WITH DIFFERENTIAL/PLATELET
Abs Immature Granulocytes: 0.06 10*3/uL (ref 0.00–0.07)
Basophils Absolute: 0.1 10*3/uL (ref 0.0–0.1)
Basophils Relative: 0 %
Eosinophils Absolute: 0 10*3/uL (ref 0.0–0.5)
Eosinophils Relative: 0 %
HCT: 45.4 % (ref 39.0–52.0)
Hemoglobin: 14.5 g/dL (ref 13.0–17.0)
Immature Granulocytes: 0 %
Lymphocytes Relative: 13 %
Lymphs Abs: 2.1 10*3/uL (ref 0.7–4.0)
MCH: 31.3 pg (ref 26.0–34.0)
MCHC: 31.9 g/dL (ref 30.0–36.0)
MCV: 98.1 fL (ref 80.0–100.0)
Monocytes Absolute: 2.6 10*3/uL — ABNORMAL HIGH (ref 0.1–1.0)
Monocytes Relative: 16 %
Neutro Abs: 11.6 10*3/uL — ABNORMAL HIGH (ref 1.7–7.7)
Neutrophils Relative %: 71 %
Platelets: 259 10*3/uL (ref 150–400)
RBC: 4.63 MIL/uL (ref 4.22–5.81)
RDW: 13 % (ref 11.5–15.5)
WBC: 16.5 10*3/uL — ABNORMAL HIGH (ref 4.0–10.5)
nRBC: 0 % (ref 0.0–0.2)

## 2024-01-04 LAB — BASIC METABOLIC PANEL WITH GFR
Anion gap: 10 (ref 5–15)
BUN: 8 mg/dL (ref 8–23)
CO2: 26 mmol/L (ref 22–32)
Calcium: 9 mg/dL (ref 8.9–10.3)
Chloride: 105 mmol/L (ref 98–111)
Creatinine, Ser: 0.64 mg/dL (ref 0.61–1.24)
GFR, Estimated: >60 mL/min
Glucose, Bld: 99 mg/dL (ref 70–99)
Potassium: 3.8 mmol/L (ref 3.5–5.1)
Sodium: 141 mmol/L (ref 135–145)

## 2024-01-04 LAB — PROTIME-INR
INR: 2.8 — ABNORMAL HIGH (ref 0.8–1.2)
Prothrombin Time: 29.7 s — ABNORMAL HIGH (ref 11.4–15.2)

## 2024-01-04 LAB — C-REACTIVE PROTEIN: CRP: 21.9 mg/dL — ABNORMAL HIGH

## 2024-01-04 LAB — SEDIMENTATION RATE: Sed Rate: 22 mm/h — ABNORMAL HIGH (ref 0–16)

## 2024-01-04 MED ORDER — METHYLPREDNISOLONE 4 MG PO TBPK: ORAL_TABLET | ORAL | 0 refills | Status: DC

## 2024-01-04 MED ORDER — DICLOFENAC SODIUM 1 % EX GEL: 4.0000 g | Freq: Four times a day (QID) | CUTANEOUS | 0 refills | Status: AC

## 2024-01-04 MED ORDER — PREDNISONE 20 MG PO TABS
40.0000 mg | ORAL_TABLET | Freq: Once | ORAL | Status: AC
  Administered 2024-01-04: 40 mg via ORAL
  Filled 2024-01-04: qty 2

## 2024-01-04 MED ORDER — KETOROLAC TROMETHAMINE 15 MG/ML IJ SOLN
15.0000 mg | Freq: Once | INTRAMUSCULAR | Status: AC
  Administered 2024-01-04: 15 mg via INTRAVENOUS
  Filled 2024-01-04: qty 1

## 2024-01-04 MED ORDER — LIDOCAINE-EPINEPHRINE 2 %-1:100000 IJ SOLN
20.0000 mL | Freq: Once | INTRAMUSCULAR | Status: AC
  Administered 2024-01-04: 20 mL
  Filled 2024-01-04 (×2): qty 20

## 2024-01-04 NOTE — Discharge Instructions (Addendum)
 Take the steroids as prescribed.  You can use the gel as well.  Please follow-up with your family doctor in the office.  Also take tylenol  1000mg (2 extra strength) four times a day.

## 2024-01-04 NOTE — ED Notes (Signed)
 This RN tried to ambulate patient.  Patient struggled to get up and walk.  MD Belfi notified and aware.

## 2024-01-04 NOTE — ED Notes (Signed)
 Awaiting lidocaine -epi injection from main pharmacy for EDP administration.

## 2024-01-04 NOTE — ED Triage Notes (Signed)
 Pt BIB [facility] from home due to left knee pain and swelling.  Pt was given 100mcg fentanyl  and 800 advil en route.  8/10 pain.  Hx a-fib and takes coumadin . VS BP 153/80, HR 54

## 2024-01-04 NOTE — ED Provider Notes (Signed)
  EMERGENCY DEPARTMENT AT [facility] Provider Note   CSN: [id number] Arrival date & time: 01/04/24  1123     History  No chief complaint on file.   Larry Bautista is a 83 y.o. male.  83 yo M with a chief complaints of left knee pain.  Going on for couple days.  No fevers or chills.  No injury.        Home Medications Prior to Admission medications   Medication Sig Start Date End Date Taking? Authorizing Provider  diclofenac Sodium (VOLTAREN) 1 % GEL Apply 4 g topically 4 (four) times daily. 01/04/24  Yes Albertus Hughs, DO  methylPREDNISolone  (MEDROL  DOSEPAK) 4 MG TBPK tablet Day 1: 8mg  before breakfast, 4 mg after lunch, 4 mg after supper, and 8 mg at bedtime Day 2: 4 mg before breakfast, 4 mg after lunch, 4 mg  after supper, and 8 mg  at bedtime Day 3:  4 mg  before breakfast, 4 mg  after lunch, 4 mg after supper, and 4 mg  at bedtime Day 4: 4 mg  before breakfast, 4 mg  after lunch, and 4 mg at bedtime Day 5: 4 mg  before breakfast and 4 mg at bedtime Day 6: 4 mg  before breakfast 01/04/24  Yes Gayanne Prescott, DO  cetirizine  (ZYRTEC ) 10 MG tablet Take 1 tablet (10 mg total) by mouth daily. 01/21/23   Sheryle Donning, MD  ciclopirox  (PENLAC ) 8 % solution Apply topically at bedtime. Apply over nail and surrounding skin. Apply daily over previous coat. After seven (7) days, may remove with alcohol  and continue cycle. 12/29/23   Elta Halter, MD  diphenhydramine -acetaminophen  (TYLENOL  PM) 25-500 MG TABS tablet Take 1 tablet by mouth at bedtime as needed.    [provider]  eplerenone  (INSPRA ) 25 MG tablet Take 1 tablet (25 mg total) by mouth every Monday, Wednesday, and Friday. 01/21/23   Sheryle Donning, MD  fluticasone  (FLONASE ) 50 MCG/ACT nasal spray USE 2 SPRAYS IN EACH NOSTRIL DAILY 11/10/23   Christel Cousins, MD  gabapentin  (NEURONTIN ) 100 MG capsule Take 100 mg by mouth daily. 06/25/21   [provider]  nitroGLYCERIN  (NITROSTAT )  0.4 MG SL tablet DISSOLVE 1 TAB UNDER TOUNGE FOR CHEST PAIN. MAY REPEAT EVERY 5 MINUTES FOR 3 DOSES. IF NO RELIEF CALL 911 OR GO TO ER 04/20/23   Sheryle Donning, MD  predniSONE  (DELTASONE ) 20 MG tablet Take 2 pills in the morning with breakfast for 3 days, then 1 pill for 2 days 10/15/23   Versa Gore, NP  psyllium (METAMUCIL) 58.6 % powder Take 1 packet by mouth daily.     [provider]  rosuvastatin  (CRESTOR ) 20 MG tablet TAKE ONE (1) TABLET BY MOUTH EVERY DAY 04/20/23   Sheryle Donning, MD  tamsulosin  (FLOMAX ) 0.4 MG CAPS capsule Take 0.4 mg by mouth daily after supper.    [provider]  tiZANidine (ZANAFLEX) 4 MG tablet Take 4 mg by mouth daily. 07/29/21   [provider]  vitamin C  (ASCORBIC ACID ) 500 MG tablet Take 500 mg by mouth daily.    [provider]  warfarin (COUMADIN ) 2.5 MG tablet TAKE 1/2 TABLET ON SUNDAY, TUESDAY, THURSDAY, & SATURDAY OR AS DIRECTED 06/02/23   Sheryle Donning, MD  warfarin (COUMADIN ) 5 MG tablet TAKE 1/2 TABLET ON MONDAY, WEDNESDAY, AND FRIDAY 01/21/23   Sheryle Donning, MD      Allergies    Celecoxib, Lisinopril , and Simvastatin    Review of  Systems   Review of Systems  Physical Exam Updated Vital Signs BP 118/72 (BP Location: Right Arm)   Pulse 69   Temp 97.7 F (36.5 C) (Oral)   Resp 17   SpO2 98%  Physical Exam Vitals and nursing note reviewed.  Constitutional:      Appearance: He is well-developed.  HENT:     Head: Normocephalic and atraumatic.  Eyes:     Pupils: Pupils are equal, round, and reactive to light.  Neck:     Vascular: No JVD.  Cardiovascular:     Rate and Rhythm: Normal rate and regular rhythm.     Heart sounds: No murmur heard.    No friction rub. No gallop.  Pulmonary:     Effort: No respiratory distress.     Breath sounds: No wheezing.  Abdominal:     General: There is no distension.     Tenderness: There is no abdominal tenderness. There is no  guarding or rebound.  Musculoskeletal:        General: Normal range of motion.     Cervical back: Normal range of motion and neck supple.     Comments: Pain and swelling to the left knee.  Edematous with some warmth.  Able to range but with some discomfort.  There is a mild erythema about the inferior medial aspect of the knee.  Pulse motor and sensation intact distally.  Skin:    Coloration: Skin is not pale.     Findings: No rash.  Neurological:     Mental Status: He is alert and oriented to person, place, and time.  Psychiatric:        Behavior: Behavior normal.     ED Results / Procedures / Treatments   Labs (all labs ordered are listed, but only abnormal results are displayed) Labs Reviewed  SYNOVIAL CELL COUNT + DIFF, W/ CRYSTALS - Abnormal; Notable for the following components:      Result Value   Color, Synovial YELLOW (*)    Appearance-Synovial CLOUDY (*)    WBC, Synovial 11,000 (*)    Neutrophil, Synovial 90 (*)    Monocyte-Macrophage-Synovial Fluid 3 (*)    All other components within normal limits  CBC WITH DIFFERENTIAL/PLATELET - Abnormal; Notable for the following components:   WBC 16.5 (*)    Neutro Abs 11.6 (*)    Monocytes Absolute 2.6 (*)    All other components within normal limits  SEDIMENTATION RATE - Abnormal; Notable for the following components:   Sed Rate 22 (*)    All other components within normal limits  C-REACTIVE PROTEIN - Abnormal; Notable for the following components:   CRP 21.9 (*)    All other components within normal limits  BODY FLUID CULTURE W GRAM STAIN  BASIC METABOLIC PANEL WITH GFR  GLUCOSE, BODY FLUID OTHER            PROTEIN, BODY FLUID (OTHER)  PROTIME-INR    EKG None  Radiology DG Knee Complete 4 Views Left Result Date: 01/04/2024 CLINICAL DATA:  Left knee pain and swelling.  No history of trauma. EXAM: LEFT KNEE - COMPLETE 4 VIEW COMPARISON:  X-ray 09/07/2019 FINDINGS: No fracture or dislocation. Preserved joint spaces.  Minimal osteophytes identified. Large joint effusion once again seen. There is chondrocalcinosis which appears progressive from previous examination. Prominent vascular calcifications as well. Preserved bone mineralization. IMPRESSION: Progressive chondrocalcinosis. Minimal osteophyte formation. Once again there is a large joint effusion. Please correlate for etiology of the effusion. There is a  broad differential. Electronically Signed   By: Adrianna Horde M.D.   On: 01/04/2024 12:13    Procedures .Joint Aspiration/Arthrocentesis  Date/Time: 01/04/2024 1:19 PM  Performed by: Albertus Hughs, DO Authorized by: Albertus Hughs, DO   Consent:    Consent obtained:  Verbal   Consent given by:  Patient   Risks, benefits, and alternatives were discussed: yes     Risks discussed:  Infection and incomplete drainage   Alternatives discussed:  No treatment Universal protocol:    Procedure explained and questions answered to patient or proxy's satisfaction: yes     Immediately prior to procedure, a time out was called: yes     Patient identity confirmed:  Verbally with patient Location:    Location:  Knee     Medications Ordered in ED Medications  ketorolac (TORADOL) 15 MG/ML injection 15 mg (has no administration in time range)  lidocaine -EPINEPHrine  (XYLOCAINE  W/EPI) 2 %-1:100000 (with pres) injection 20 mL (20 mLs Other Given 01/04/24 1227)    ED Course/ Medical Decision Making/ A&P                                 Medical Decision Making Amount and/or Complexity of Data Reviewed Labs: ordered. Radiology: ordered.  Risk Prescription drug management.   83 yo M with a  cc of left knee pain.  Significantly swollen.  No obvious injury.  There is some erythema but seems to be localized to the anterior medial aspect.  I think less likely to be septic arthritis but the patient has no history of the same.  Afebrile here.  Perform arthrocentesis.  Fluid studies most consistent with pseudogout.  He does  have leukocytosis here, CRP and ESR quite elevated.  White counts of the fluid at 11,000.  Patient is feeling a bit better on repeat assessment.  Will discharge home.  PCP Ortho follow-up.  3:46 PM:  I have discussed the diagnosis/risks/treatment options with the patient and family.  Evaluation and diagnostic testing in the emergency department does not suggest an emergent condition requiring admission or immediate intervention beyond what has been performed at this time.  They will follow up with PCP. We also discussed returning to the ED immediately if new or worsening sx occur. We discussed the sx which are most concerning (e.g., sudden worsening pain, fever, inability to tolerate by mouth) that necessitate immediate return. Medications administered to the patient during their visit and any new prescriptions provided to the patient are listed below.  Medications given during this visit Medications  ketorolac (TORADOL) 15 MG/ML injection 15 mg (has no administration in time range)  lidocaine -EPINEPHrine  (XYLOCAINE  W/EPI) 2 %-1:100000 (with pres) injection 20 mL (20 mLs Other Given 01/04/24 1227)     The patient appears reasonably screen and/or stabilized for discharge and I doubt any other medical condition or other [facility] requiring further screening, evaluation, or treatment in the ED at this time prior to discharge.          Final Clinical Impression(s) / ED Diagnoses Final diagnoses:  Pseudogout of knee, left    Rx / DC Orders ED Discharge Orders          Ordered    methylPREDNISolone  (MEDROL  DOSEPAK) 4 MG TBPK tablet        01/04/24 1544    diclofenac Sodium (VOLTAREN) 1 % GEL  4 times daily        01/04/24 1545  Albertus Hughs, DO 01/04/24 1546

## 2024-01-05 NOTE — ED Provider Notes (Signed)
 Patient had been discharged by Dr. Inga Manges.  RN tried to ambulate patient and said he was having difficulty.  He lives at home with his wife.  He says he is having trouble due to his knee pain although it seems to be better since it was treated here in the ED and arthrocentesis was performed.  Offered patient and family social work consult for possible placement if they did not feel comfortable with patient going home but patient does not want to be placed.  Offered home health services but they declined.  Patient discharged to home.  Will follow-up with her primary care doctor if they change their mind about needing more services at home.   Hershel Los, MD 01/05/24 [phone]

## 2024-01-06 LAB — PROTEIN, BODY FLUID (OTHER): Total Protein, Body Fluid Other: 3.8 g/dL

## 2024-01-06 LAB — GLUCOSE, BODY FLUID OTHER: Glucose, Body Fluid Other: 3 mg/dL

## 2024-01-07 LAB — BODY FLUID CULTURE W GRAM STAIN

## 2024-01-11 ENCOUNTER — Other Ambulatory Visit (HOSPITAL_BASED_OUTPATIENT_CLINIC_OR_DEPARTMENT_OTHER): Payer: Self-pay | Admitting: Cardiology

## 2024-01-14 ENCOUNTER — Encounter: Payer: Self-pay | Admitting: Family Medicine

## 2024-01-14 ENCOUNTER — Ambulatory Visit: Payer: Medicare Other | Admitting: Family Medicine

## 2024-01-14 VITALS — BP 133/87 | HR 83 | Temp 97.5°F | Resp 16 | Ht 67.0 in | Wt 175.0 lb

## 2024-01-14 DIAGNOSIS — R739 Hyperglycemia, unspecified: Secondary | ICD-10-CM | POA: Diagnosis not present

## 2024-01-14 DIAGNOSIS — E7800 Pure hypercholesterolemia, unspecified: Secondary | ICD-10-CM

## 2024-01-14 DIAGNOSIS — I10 Essential (primary) hypertension: Secondary | ICD-10-CM

## 2024-01-14 DIAGNOSIS — I480 Paroxysmal atrial fibrillation: Secondary | ICD-10-CM

## 2024-01-14 DIAGNOSIS — I2510 Atherosclerotic heart disease of native coronary artery without angina pectoris: Secondary | ICD-10-CM | POA: Diagnosis not present

## 2024-01-14 LAB — CBC WITH DIFFERENTIAL/PLATELET
Basophils Absolute: 0 10*3/uL (ref 0.0–0.1)
Basophils Relative: 0.5 % (ref 0.0–3.0)
Eosinophils Absolute: 0.1 10*3/uL (ref 0.0–0.7)
Eosinophils Relative: 1.3 % (ref 0.0–5.0)
HCT: 46 % (ref 39.0–52.0)
Hemoglobin: 15.1 g/dL (ref 13.0–17.0)
Lymphocytes Relative: 19.6 % (ref 12.0–46.0)
Lymphs Abs: 1.9 10*3/uL (ref 0.7–4.0)
MCHC: 32.9 g/dL (ref 30.0–36.0)
MCV: 95.9 fl (ref 78.0–100.0)
Monocytes Absolute: 1 10*3/uL (ref 0.1–1.0)
Monocytes Relative: 10.2 % (ref 3.0–12.0)
Neutro Abs: 6.6 10*3/uL (ref 1.4–7.7)
Neutrophils Relative %: 68.4 % (ref 43.0–77.0)
Platelets: 343 10*3/uL (ref 150.0–400.0)
RBC: 4.8 Mil/uL (ref 4.22–5.81)
RDW: 13.8 % (ref 11.5–15.5)
WBC: 9.6 10*3/uL (ref 4.0–10.5)

## 2024-01-14 LAB — COMPREHENSIVE METABOLIC PANEL WITH GFR
ALT: 22 U/L (ref 0–53)
AST: 23 U/L (ref 0–37)
Albumin: 4.1 g/dL (ref 3.5–5.2)
Alkaline Phosphatase: 44 U/L (ref 39–117)
BUN: 12 mg/dL (ref 6–23)
CO2: 29 meq/L (ref 19–32)
Calcium: 9.1 mg/dL (ref 8.4–10.5)
Chloride: 103 meq/L (ref 96–112)
Creatinine, Ser: 0.81 mg/dL (ref 0.40–1.50)
GFR: 81.08 mL/min (ref >60.00)
Glucose, Bld: 91 mg/dL (ref 70–99)
Potassium: 4.7 meq/L (ref 3.5–5.1)
Sodium: 140 meq/L (ref 135–145)
Total Bilirubin: 0.8 mg/dL (ref 0.2–1.2)
Total Protein: 6.5 g/dL (ref 6.0–8.3)

## 2024-01-14 LAB — TSH: TSH: 0.99 u[IU]/mL (ref 0.35–5.50)

## 2024-01-14 LAB — LIPID PANEL
Cholesterol: 127 mg/dL (ref 0–200)
HDL: 48 mg/dL (ref >39.00)
LDL Cholesterol: 48 mg/dL (ref 0–99)
NonHDL: 78.95
Total CHOL/HDL Ratio: 3
Triglycerides: 153 mg/dL — ABNORMAL HIGH (ref 0.0–149.0)
VLDL: 30.6 mg/dL (ref 0.0–40.0)

## 2024-01-14 LAB — HEMOGLOBIN A1C: Hgb A1c MFr Bld: 6.6 % — ABNORMAL HIGH (ref 4.6–6.5)

## 2024-01-14 NOTE — Patient Instructions (Signed)

## 2024-01-14 NOTE — Progress Notes (Signed)
 Labs are ok except A1C is elevated-work on low sugar and lower starch diet.  Repeat A1C in 3 months-can be nurse only for finger A1C.

## 2024-01-14 NOTE — Progress Notes (Signed)
 Subjective:     Patient ID: Larry Bautista, male    DOB: 06/19/1941, 83 y.o.   MRN: [id number]  Chief Complaint  Patient presents with   Medical Management of Chronic Issues    1 year follow-up Not fasting Recent ER visit on 01/04/24    HPI Cad,a fib, hld Murphy weiner Discussed the use of AI scribe software for clinical note transcription with the patient, who gave verbal consent to proceed.  History of Present Illness Larry Bautista is an 83 year old male who presents for annual f/u CAD, afib, HLD  He has experienced left knee pain for approximately six to nine months, leading to an inability to walk and requiring recent ER visit. During the hospital visit, he was diagnosed with pseudogout, and fluid was drained from the knee. Treatment with methylprednisolone   improved his condition, allowing him to stand independently. He has fallen a few times due to knee instability. The knee pain is not constant and has not been significantly bothersome recently. No fever or chills.  He has a history of atrial fibrillation and is on Coumadin -managed by coumadin  clinic. He experiences occasional left chest pain that resolves with applying pressure on his L chest wall. He is scheduled to see his cardiologist tomorrow. He continues to take Inspra  and rosuvastatin  20 mg for heart and cholesterol management, respectively.  He has a history of esoph cancer treatment, which included chemotherapy and radiation, resulting in a persistent cough that he attributes to scar tissue. Leaning back tends to trigger coughing spells.  He is currently using Penlac  solution for toenail issues, applying it daily and removing it weekly with alcohol . He is also using Flonase  nasal spray for allergies and does not require a refill at this time.  He mentions a non-painful, cyst-like lesion on his left nipple that has been present for a few months, which he suspects may be related to past cancer treatment. He is  monitoring it for any changes.  Also mentions ED and inq about viagra.      Health Maintenance Due  Topic Date Due   Medicare Annual Wellness (AWV)  02/19/2024    Past Medical History:  Diagnosis Date   Allergy     Arthritis    Basal cell carcinoma    L face, txted in [place] yrs ago   Blood transfusion without reported diagnosis    Cancer (HCC)    esoph-chemo and rad   CHF (congestive heart failure) (HCC)    Chronic knee pain    Coronary artery disease    with LAD DE stent 2004   Dysplastic nevus 03/26/2016   right mid med calf   Dysrhythmia    Heel spur, left    Hyperlipidemia    Hypertension    Kidney stones    Lower back pain    status post surgery for spondylolisthesis   Migraine    Persistent atrial fibrillation (HCC)    longstanding persistent (since 05/2008)   Plantar fasciitis     Past Surgical History:  Procedure Laterality Date   BACK SURGERY     spondylolisthesis   CARDIAC CATHETERIZATION  03/06/2003   CORONARY ANGIOPLASTY WITH STENT PLACEMENT  04/09/2003   CYPHER stent implantation in the LAD   ESOPHAGOGASTRODUODENOSCOPY (EGD) WITH PROPOFOL  N/A 01/07/2018   Procedure: ESOPHAGOGASTRODUODENOSCOPY (EGD) WITH PROPOFOL ;  Surgeon: Celedonio Coil, MD;  Location: [facility];  Service: Endoscopy;  Laterality: N/A;   HAND SURGERY Right 09/08/2012   carpal tunnel   HERNIA  REPAIR     INGUINAL HERNIA REPAIR Right 04/26/2020   Procedure: RIGHT INGUINAL HERNIA REPAIR WITH MESH;  Surgeon: Oza Blumenthal, MD;  Location: [facility];  Service: General;  Laterality: Right;   REVERSE SHOULDER ARTHROPLASTY Right 03/26/2023   Procedure: REVERSE SHOULDER ARTHROPLASTY;  Surgeon: Micheline Ahr, MD;  Location: [facility];  Service: Orthopedics;  Laterality: Right;   SKIN CANCER EXCISION  05/10/1979   Face   SPINE SURGERY  2000   VASCULAR SURGERY     Vascular Stent     Current Outpatient Medications:    cetirizine  (ZYRTEC ) 10 MG  tablet, Take 1 tablet (10 mg total) by mouth daily., Disp: 90 tablet, Rfl: 3   ciclopirox  (PENLAC ) 8 % solution, Apply topically at bedtime. Apply over nail and surrounding skin. Apply daily over previous coat. After seven (7) days, may remove with alcohol  and continue cycle., Disp: 6.6 mL, Rfl: 11   diclofenac  Sodium (VOLTAREN ) 1 % GEL, Apply 4 g topically 4 (four) times daily., Disp: 100 g, Rfl: 0   eplerenone  (INSPRA ) 25 MG tablet, TAKE 1 TABLET BY MOUTH EVERY MONDAY, WEDNESDAY, & FRIDAY., Disp: 15 tablet, Rfl: 0   fluticasone  (FLONASE ) 50 MCG/ACT nasal spray, USE 2 SPRAYS IN EACH NOSTRIL DAILY, Disp: 48 g, Rfl: 0   gabapentin  (NEURONTIN ) 100 MG capsule, Take 100 mg by mouth daily., Disp: , Rfl:    nitroGLYCERIN  (NITROSTAT ) 0.4 MG SL tablet, DISSOLVE 1 TAB UNDER TOUNGE FOR CHEST PAIN. MAY REPEAT EVERY 5 MINUTES FOR 3 DOSES. IF NO RELIEF CALL 911 OR GO TO ER, Disp: 25 tablet, Rfl: 1   psyllium (METAMUCIL) 58.6 % powder, Take 1 packet by mouth daily. , Disp: , Rfl:    rosuvastatin  (CRESTOR ) 20 MG tablet, TAKE ONE (1) TABLET BY MOUTH EVERY DAY, Disp: 30 tablet, Rfl: 0   tamsulosin  (FLOMAX ) 0.4 MG CAPS capsule, Take 0.4 mg by mouth daily after supper., Disp: , Rfl:    tiZANidine (ZANAFLEX) 4 MG tablet, Take 4 mg by mouth daily., Disp: , Rfl:    vitamin C  (ASCORBIC ACID ) 500 MG tablet, Take 500 mg by mouth daily., Disp: , Rfl:    warfarin (COUMADIN ) 2.5 MG tablet, TAKE 1/2 TABLET ON SUNDAY, TUESDAY, THURSDAY, & SATURDAY OR AS DIRECTED, Disp: 25 tablet, Rfl: 3   warfarin (COUMADIN ) 5 MG tablet, TAKE 1/2 TABLET ON MONDAY, WEDNESDAY, AND FRIDAY, Disp: 30 tablet, Rfl: 1   diphenhydramine -acetaminophen  (TYLENOL  PM) 25-500 MG TABS tablet, Take 1 tablet by mouth at bedtime as needed. (Patient not taking: Reported on 01/14/2024), Disp: , Rfl:   Allergies  Allergen Reactions   Celecoxib Rash and Dermatitis   Lisinopril  Cough    Other Reaction(s): cough   Simvastatin Other (See Comments)    Leg pain   ROS  neg/noncontributory except as noted HPI/below      Objective:      BP 133/87   Pulse 83   Temp (!) 97.5 F (36.4 C) (Temporal)   Resp 16   Ht 5\' 7"  (1.702 m)   Wt 175 lb (79.4 kg)   SpO2 94%   BMI 27.41 kg/m  Wt Readings from Last 3 Encounters:  01/14/24 175 lb (79.4 kg)  10/15/23 176 lb 12.8 oz (80.2 kg)  03/26/23 176 lb 2.4 oz (79.9 kg)    Physical Exam   Gen: WDWN NAD HEENT: NCAT, conjunctiva not injected, sclera nonicteric NECK:  supple, no thyromegaly, no nodes, no carotid bruits CARDIAC: RRR, S1S2+, no murmur.  DP 2+B LUNGS: CTAB. No wheezes ABDOMEN:  BS+, soft, NTND, No HSM, no masses EXT:  no edema MSK: no gross abnormalities.  NEURO: A&O x3.  CN II-XII intact.  PSYCH: normal mood. Good eye contact     Assessment & Plan:  AF (paroxysmal atrial fibrillation) (HCC) -     CBC with Differential/Platelet -     Comprehensive metabolic panel with GFR  CAD in native artery  Essential hypertension -     CBC with Differential/Platelet -     Comprehensive metabolic panel with GFR  Pure hypercholesterolemia -     Comprehensive metabolic panel with GFR -     TSH -     Lipid panel  Hyperglycemia -     Hemoglobin A1c -     TSH  Assessment and Plan Assessment & Plan Atrial fibrillation   Chronic atrial fibrillation is managed with warfarin. He reports no recent episodes of chest pain or dyspnea, though he experiences occasional left chest pain relieved by pressure. A regular cardiology follow-up is scheduled for tomorrow, where he plans to discuss new anticoagulant options. Continue warfarin therapy and regular cardiology follow-up.  Pseudo-gout in left knee   A recent pseudo-gout episode in the left knee caused significant pain, treated with methylprednisolone  . Symptoms are improving with no fever or chills. He prefers non-surgical options for future flare-ups and discussed potential cortisone injections with orthopedics or sports medicine if needed. Consider  referral to orthopedics or sports medicine if symptoms worsen.  Hyperlipidemia   Chronic hyperlipidemia is managed with rosuvastatin  20 mg. Blood work for cholesterol levels is scheduled for today. Continue rosuvastatin  20 mg daily.  Allergic rhinitis   Chronic allergic rhinitis is managed with Flonase  nasal spray. No current need for a refill. Continue Flonase  nasal spray as needed.  Erectile dysfunction   Chronic erectile dysfunction is present. He is interested in medication but concerned about side effects and interactions with current medications. He and his spouse are currently satisfied with non-pharmacological methods. Discussed potential risks of medication, especially if nitroglycerin  is needed. Reassess if he desires medication in the future.  Cancer treatment   Post-cancer treatment with chemotherapy and radiation has resulted in a persistent cough likely due to scar tissue. There is no current oncologist follow-up. He reports a loss of taste and smell post-treatment. Monitor cough symptoms.  Hyperglycemia-check labs.     Return in about 1 year (around 01/13/2025) for chronic follow-up. Ate 3 hr ago  Ellsworth Haas, MD

## 2024-01-15 ENCOUNTER — Encounter (HOSPITAL_BASED_OUTPATIENT_CLINIC_OR_DEPARTMENT_OTHER): Payer: Self-pay | Admitting: Cardiology

## 2024-01-15 ENCOUNTER — Ambulatory Visit (HOSPITAL_BASED_OUTPATIENT_CLINIC_OR_DEPARTMENT_OTHER): Admitting: Cardiology

## 2024-01-15 VITALS — BP 124/82 | HR 59 | Ht 67.0 in | Wt 174.2 lb

## 2024-01-15 DIAGNOSIS — I2510 Atherosclerotic heart disease of native coronary artery without angina pectoris: Secondary | ICD-10-CM | POA: Diagnosis not present

## 2024-01-15 DIAGNOSIS — E7800 Pure hypercholesterolemia, unspecified: Secondary | ICD-10-CM | POA: Diagnosis not present

## 2024-01-15 DIAGNOSIS — Z7901 Long term (current) use of anticoagulants: Secondary | ICD-10-CM | POA: Diagnosis not present

## 2024-01-15 DIAGNOSIS — D6869 Other thrombophilia: Secondary | ICD-10-CM | POA: Diagnosis not present

## 2024-01-15 DIAGNOSIS — I4821 Permanent atrial fibrillation: Secondary | ICD-10-CM

## 2024-01-15 DIAGNOSIS — I10 Essential (primary) hypertension: Secondary | ICD-10-CM | POA: Diagnosis not present

## 2024-01-15 NOTE — Progress Notes (Signed)
 Cardiology Office Note:  .   Date:  01/15/2024  ID:  Larry Bautista, DOB June 02, 1941, MRN [id number] PCP: Christel Cousins, MD  [facility] Providers Cardiologist:  Sheryle Donning, MD {  History of Present Illness: .   Larry Bautista is a 83 y.o. male with a hx of CAD with LAD stent 2004, chronic atrial fibrillation, anticoagulation therapy, hypertension, Hyperlipidemia, and chronic diastolic heart failure who is seen for follow up. She was previously seen by Dr. Ollie Bhat, established care with me 11/18/22.  Today: Was in ER with gout/pseudogout last week. Feels this is gradually getting better but not at baseline. Has had falls, last about 2 weeks ago, mechanical due to instability.   Has been on coumadin  for a long time. We have discussed DOAC in the past, he is hesitant. We discussed data on DOACs, plans for future generics. Asks very good questions, wants to know how you know its working, etc. Has not had major bleeding on coumadin . Does have easy bruising.    ROS: Denies chest pain, shortness of breath at rest or with normal exertion. No PND, orthopnea, LE edema or unexpected weight gain. No syncope or palpitations. ROS otherwise negative except as noted.   Studies Reviewed: Larry Bautista    EKG:  EKG Interpretation Date/Time:  Friday Jan 15 2024 15:46:55 EDT Ventricular Rate:  59 PR Interval:    QRS Duration:  84 QT Interval:  408 QTC Calculation: 403 R Axis:   -60  Text Interpretation: Atrial fibrillation Confirmed by Sheryle Donning [phone]) on 01/15/2024 4:05:42 PM    Physical Exam:   VS:  BP 124/82 (BP Location: Left Arm, Patient Position: Sitting, Cuff Size: Normal)   Pulse (!) 59   Ht 5\' 7"  (1.702 m)   Wt 174 lb 3.2 oz (79 kg)   SpO2 96%   BMI 27.28 kg/m    Wt Readings from Last 3 Encounters:  01/15/24 174 lb 3.2 oz (79 kg)  01/14/24 175 lb (79.4 kg)  10/15/23 176 lb 12.8 oz (80.2 kg)    GEN: Well nourished, well developed in no acute  distress HEENT: Normal, moist mucous membranes NECK: No JVD CARDIAC: regular rhythm, normal S1 and S2, no rubs or gallops. No murmur. VASCULAR: Radial and DP pulses 2+ bilaterally. No carotid bruits RESPIRATORY:  Clear to auscultation without rales, wheezing or rhonchi  ABDOMEN: Soft, non-tender, non-distended MUSCULOSKELETAL:  Ambulates independently SKIN: Warm and dry, no edema NEUROLOGIC:  Alert and oriented x 3. No focal neuro deficits noted. PSYCHIATRIC:  Normal affect    ASSESSMENT AND PLAN: .    Atrial fibrillation, permanenet -rate controlled -CHA2DS2/VAS Stroke Risk Points= 5  -he is on coumadin  and doing well. Doesn't want to change to DOAC now, but would consider in the future if price is more affordable   Hypertension -continue three times weekly eplerenone    CAD Hypercholesterolemia -continue rosuvastatin  20 mg daily, LDL at 48 -no aspirin as he is on coumadin  -no angina, reviewed red flag warning signs that need immediate medical attention  CV risk counseling and prevention -recommend heart healthy/Mediterranean diet, with whole grains, fruits, vegetable, fish, lean meats, nuts, and olive oil. Limit salt. -recommend moderate walking, 3-5 times/week for 30-50 minutes each session. Aim for at least 150 minutes.week. Goal should be pace of 3 miles/hours, or walking 1.5 miles in 30 minutes -recommend avoidance of tobacco products. Avoid excess alcohol .  Dispo: 1 year or sooner as needed  Signed, Sheryle Donning, MD   [employer]  Veryl Gottron, MD, PhD, [facility] [place]  [facility]    Heart & Vascular at [facility] at [facility] [address], [address] [place], [place] [address] [phone]

## 2024-01-15 NOTE — Patient Instructions (Addendum)
 Medication Instructions:  Your physician recommends that you continue on your current medications as directed. Please refer to the Current Medication list given to you today.   We discussed Xarelto (rivaroxaban) and Eliquis (apixaban) today instead of coumadin . Xarelto is once a day with a meal, and Eliquis is twice a day. If you are interested in changing to these from coumadin , just let me know.  Follow-Up: Please follow up in 12 months with Dr. Veryl Gottron, Slater Duncan, NP or Neomi Banks, NP

## 2024-01-19 ENCOUNTER — Encounter (HOSPITAL_BASED_OUTPATIENT_CLINIC_OR_DEPARTMENT_OTHER): Payer: Self-pay

## 2024-01-19 ENCOUNTER — Other Ambulatory Visit (HOSPITAL_BASED_OUTPATIENT_CLINIC_OR_DEPARTMENT_OTHER): Payer: Self-pay

## 2024-01-19 ENCOUNTER — Other Ambulatory Visit (HOSPITAL_COMMUNITY): Payer: Self-pay

## 2024-01-19 MED ORDER — RIVAROXABAN 20 MG PO TABS
20.0000 mg | ORAL_TABLET | Freq: Every day | ORAL | 5 refills | Status: DC
  Filled 2024-01-19: qty 30, 30d supply, fill #0

## 2024-01-19 MED ORDER — EPLERENONE 25 MG PO TABS: 25.0000 mg | ORAL_TABLET | ORAL | 3 refills | Status: DC

## 2024-01-19 NOTE — Telephone Encounter (Signed)
 Please review and advise.

## 2024-01-25 DIAGNOSIS — M1712 Unilateral primary osteoarthritis, left knee: Secondary | ICD-10-CM | POA: Diagnosis not present

## 2024-01-28 ENCOUNTER — Other Ambulatory Visit (HOSPITAL_COMMUNITY): Payer: Self-pay

## 2024-02-01 ENCOUNTER — Other Ambulatory Visit (HOSPITAL_BASED_OUTPATIENT_CLINIC_OR_DEPARTMENT_OTHER): Payer: Self-pay | Admitting: Cardiology

## 2024-02-03 ENCOUNTER — Other Ambulatory Visit (HOSPITAL_BASED_OUTPATIENT_CLINIC_OR_DEPARTMENT_OTHER): Payer: Self-pay | Admitting: Cardiology

## 2024-02-03 DIAGNOSIS — I480 Paroxysmal atrial fibrillation: Secondary | ICD-10-CM

## 2024-02-03 NOTE — Telephone Encounter (Signed)
 Refill request for warfarin:  Last INR was 3.1 on 12/24/23 Next INR due 02/04/24 LOV was 01/15/24  Refill approved.

## 2024-02-04 ENCOUNTER — Ambulatory Visit: Attending: Internal Medicine | Admitting: *Deleted

## 2024-02-04 DIAGNOSIS — Z5181 Encounter for therapeutic drug level monitoring: Secondary | ICD-10-CM

## 2024-02-04 DIAGNOSIS — I480 Paroxysmal atrial fibrillation: Secondary | ICD-10-CM

## 2024-02-04 LAB — POCT INR: POC INR: 2.8

## 2024-02-04 MED ORDER — RIVAROXABAN 20 MG PO TABS: 20.0000 mg | ORAL_TABLET | Freq: Every day | ORAL | 5 refills | Status: DC

## 2024-02-04 NOTE — Patient Instructions (Signed)
 Description   Stop warfarin Start taking Xarelto  1 tablet daily with Supper.  Please call [phone] for questions

## 2024-02-29 ENCOUNTER — Ambulatory Visit: Payer: Self-pay

## 2024-02-29 NOTE — Telephone Encounter (Signed)
 FYI Patient stated he can wait until tomorrow for appointment, does not need to go to UC.

## 2024-02-29 NOTE — Telephone Encounter (Signed)
 See message below

## 2024-02-29 NOTE — Telephone Encounter (Signed)
 Copied from CRM [id number]. Topic: Clinical - Red Word Triage >> Feb 29, 2024  8:12 AM Gustabo D wrote: Patient has developed ulcers on his left leg and oozing. It has gotten worse over the weekend. Reason for Disposition  [1] Unexplained sores AND [2] 3 or more  Answer Assessment - Initial Assessment Questions 1. LOCATION: Where is the wound located?      3 ulcer on left left          1 right lower leg 2. WOUND APPEARANCE: What does the wound look like?      Oozing clear fluid 3. SIZE: If redness is present, ask: What is the size of the red area? (Inches, centimeters, or compare to size of a coin)      Red around wounds  4. SPREAD: What's changed in the last day?  Do you see any red streaks coming from the wound?     Seeping fluid  5. ONSET: When did it start to look infected?      Over the weekend  6. MECHANISM: How did the wound start, what was the cause?     ? Was itching and pt may have scratched  7. PAIN: Is there any pain? If Yes, ask: How bad is the pain?   (Scale 1-10; or mild, moderate, severe)     none 8. FEVER: Do you have a fever? If Yes, ask: What is your temperature, how was it measured, and when did it start?     no 9. OTHER SYMPTOMS: Do you have any other symptoms? (e.g., shaking chills, weakness, rash elsewhere on body)     no  Answer Assessment - Initial Assessment Questions 1. APPEARANCE of SORES: What do the sores look like?     Red  2. NUMBER: How many sores are there?     3 on left leg 1 on right 4. LOCATION: Where are the sores located?     4 total 5. ONSET: When did the sores begin?     Over the weekend worsened unsure of onset 6. TENDER: Does it hurt when you touch it?  (Scale 1-10; or mild, moderate, severe)      No pt has neuropathy from Chemo 7. CAUSE: What do you think is causing the sores?     Unsure if from pt rubbing or scratching area 8. OTHER SYMPTOMS: Do you have any other symptoms? (e.g., fever, new  weakness)     Redness to wounds and 1 wound seeping clear fluid  Protocols used: Wound Infection-A-AH, Sores-A-AH

## 2024-03-01 ENCOUNTER — Ambulatory Visit (INDEPENDENT_AMBULATORY_CARE_PROVIDER_SITE_OTHER): Admitting: Family Medicine

## 2024-03-01 ENCOUNTER — Encounter: Payer: Self-pay | Admitting: Family Medicine

## 2024-03-01 VITALS — BP 130/76 | HR 64 | Temp 97.5°F | Resp 18 | Ht 67.0 in | Wt 179.1 lb

## 2024-03-01 DIAGNOSIS — R600 Localized edema: Secondary | ICD-10-CM | POA: Diagnosis not present

## 2024-03-01 DIAGNOSIS — L03116 Cellulitis of left lower limb: Secondary | ICD-10-CM | POA: Diagnosis not present

## 2024-03-01 MED ORDER — EPLERENONE 25 MG PO TABS: 25.0000 mg | ORAL_TABLET | Freq: Every day | ORAL | 0 refills | Status: DC

## 2024-03-01 MED ORDER — MUPIROCIN 2 % EX OINT: 1.0000 | TOPICAL_OINTMENT | Freq: Two times a day (BID) | CUTANEOUS | 0 refills | Status: AC

## 2024-03-01 MED ORDER — CEPHALEXIN 500 MG PO CAPS: 500.0000 mg | ORAL_CAPSULE | Freq: Two times a day (BID) | ORAL | 0 refills | Status: AC

## 2024-03-01 NOTE — Progress Notes (Signed)
 Subjective:     Patient ID: Larry Bautista, male    DOB: 1941-06-17, 83 y.o.   MRN: [id number]  Chief Complaint  Patient presents with   Ulcers on lower leg    Ulcers on lower left leg, sores are leaking, noticed about 10 days ago bilateral ankle swelling Red spot on lower right leg    HPI Discussed the use of AI scribe software for clinical note transcription with the patient, who gave verbal consent to proceed.  History of Present Illness Larry Bautista is an 83 year old male who presents with weeping and oozing lesions on the left leg. He is accompanied by his wife.  He developed several weeping and oozing spots on his left leg a few weeks ago. These lesions ooze clear fluid and are bruised and scarred. They are painful to touch, especially under pressure. He has no history of similar episodes, recent injuries, or bug bites. He recalls scratching his leg while putting on socks, which may have contributed to the current condition. No fever, shortness of breath, or heart-related symptoms. His wife notes he does not exercise much and does not elevate his legs when sitting, which may contribute to the swelling.  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 He is experiencing new swelling in his legs, which was not present during his last medical visit a month ago. He is taking eplerenone  three times a week, but there is uncertainty about consistent use due to potential prescription refill issues-so may not be taking it.  He recently switched from warfarin to Xarelto  within the last 30 days without issues. He has a history of cancer treatment four years ago, which affected his toenails, causing them to fall off and regrow abnormally. He reports a weight loss of 20 pounds during cancer treatment, which he has since regained. His blood sugar levels were elevated last month, and he has not made recent dietary changes for diabetes management. No abdominal pain, but mentions  a 'creaking leg' and sensitivity to touch on his leg.    Health Maintenance Due  Topic Date Due   Medicare Annual Wellness (AWV)  02/19/2024    Past Medical History:  Diagnosis Date   Allergy     Arthritis    Basal cell carcinoma    L face, txted in [place] yrs ago   Blood transfusion without reported diagnosis    Cancer (HCC)    esoph-chemo and rad   CHF (congestive heart failure) (HCC)    Chronic knee pain    Coronary artery disease    with LAD DE stent 2004   Dysplastic nevus 03/26/2016   right mid med calf   Dysrhythmia    Heel spur, left    Hyperlipidemia    Hypertension    Kidney stones    Lower back pain    status post surgery for spondylolisthesis   Migraine    Persistent atrial fibrillation (HCC)    longstanding persistent (since 05/2008)   Plantar fasciitis     Past Surgical History:  Procedure Laterality Date   BACK SURGERY     spondylolisthesis   CARDIAC CATHETERIZATION  03/06/2003   CORONARY ANGIOPLASTY WITH STENT PLACEMENT  04/09/2003   CYPHER stent implantation in the LAD   ESOPHAGOGASTRODUODENOSCOPY (EGD) WITH PROPOFOL  N/A 01/07/2018   Procedure: ESOPHAGOGASTRODUODENOSCOPY (EGD) WITH PROPOFOL ;  Surgeon: Lennard Lesta FALCON, MD;  Location: [facility] ENDOSCOPY;  Service: Endoscopy;  Laterality: N/A;   HAND SURGERY Right 09/08/2012  carpal tunnel   HERNIA REPAIR     INGUINAL HERNIA REPAIR Right 04/26/2020   Procedure: RIGHT INGUINAL HERNIA REPAIR WITH MESH;  Surgeon: Vernetta Berg, MD;  Location: [facility];  Service: General;  Laterality: Right;   REVERSE SHOULDER ARTHROPLASTY Right 03/26/2023   Procedure: REVERSE SHOULDER ARTHROPLASTY;  Surgeon: Cristy Bonner DASEN, MD;  Location: [facility];  Service: Orthopedics;  Laterality: Right;   SKIN CANCER EXCISION  05/10/1979   Face   SPINE SURGERY  2000   VASCULAR SURGERY     Vascular Stent     Current Outpatient Medications:    cephALEXin  (KEFLEX ) 500 MG capsule, Take 1 capsule  (500 mg total) by mouth 2 (two) times daily for 7 days. Take for 7 days, Disp: 14 capsule, Rfl: 0   cetirizine  (ZYRTEC ) 10 MG tablet, Take 1 tablet (10 mg total) by mouth daily., Disp: 90 tablet, Rfl: 3   ciclopirox  (PENLAC ) 8 % solution, Apply topically at bedtime. Apply over nail and surrounding skin. Apply daily over previous coat. After seven (7) days, may remove with alcohol  and continue cycle., Disp: 6.6 mL, Rfl: 11   diclofenac  Sodium (VOLTAREN ) 1 % GEL, Apply 4 g topically 4 (four) times daily., Disp: 100 g, Rfl: 0   diphenhydramine -acetaminophen  (TYLENOL  PM) 25-500 MG TABS tablet, Take 1 tablet by mouth at bedtime as needed., Disp: , Rfl:    eplerenone  (INSPRA ) 25 MG tablet, Take 1 tablet (25 mg total) by mouth 3 (three) times a week., Disp: 36 tablet, Rfl: 3   eplerenone  (INSPRA ) 25 MG tablet, Take 1 tablet (25 mg total) by mouth daily., Disp: 15 tablet, Rfl: 0   fluticasone  (FLONASE ) 50 MCG/ACT nasal spray, USE 2 SPRAYS IN EACH NOSTRIL DAILY, Disp: 48 g, Rfl: 0   gabapentin  (NEURONTIN ) 100 MG capsule, Take 100 mg by mouth daily., Disp: , Rfl:    mupirocin  ointment (BACTROBAN ) 2 %, Place 1 Application into the nose 2 (two) times daily., Disp: 22 g, Rfl: 0   nitroGLYCERIN  (NITROSTAT ) 0.4 MG SL tablet, DISSOLVE 1 TAB UNDER TOUNGE FOR CHEST PAIN. MAY REPEAT EVERY 5 MINUTES FOR 3 DOSES. IF NO RELIEF CALL 911 OR GO TO ER, Disp: 25 tablet, Rfl: 1   psyllium (METAMUCIL) 58.6 % powder, Take 1 packet by mouth daily. , Disp: , Rfl:    rivaroxaban  (XARELTO ) 20 MG TABS tablet, Take 1 tablet (20 mg total) by mouth daily with supper., Disp: 30 tablet, Rfl: 5   rosuvastatin  (CRESTOR ) 20 MG tablet, TAKE ONE (1) TABLET BY MOUTH EVERY DAY, Disp: 90 tablet, Rfl: 3   tamsulosin  (FLOMAX ) 0.4 MG CAPS capsule, Take 0.4 mg by mouth daily after supper., Disp: , Rfl:    tiZANidine (ZANAFLEX) 4 MG tablet, Take 4 mg by mouth daily., Disp: , Rfl:    vitamin C  (ASCORBIC ACID ) 500 MG tablet, Take 500 mg by mouth daily.,  Disp: , Rfl:   Allergies  Allergen Reactions   Celecoxib Rash and Dermatitis   Lisinopril  Cough    Other Reaction(s): cough   Simvastatin Other (See Comments)    Leg pain   ROS neg/noncontributory except as noted HPI/below      Objective:     BP 130/76 (BP Location: Left Arm, Patient Position: Sitting, Cuff Size: Normal)   Pulse 64   Temp (!) 97.5 F (36.4 C) (Temporal)   Resp 18   Ht 5' 7 (1.702 m)   Wt 179 lb 2 oz (81.3 kg)   SpO2  97%   BMI 28.05 kg/m  Wt Readings from Last 3 Encounters:  03/01/24 179 lb 2 oz (81.3 kg)  01/15/24 174 lb 3.2 oz (79 kg)  01/14/24 175 lb (79.4 kg)    Physical Exam   Gen: WDWN NAD HEENT: NCAT, conjunctiva not injected, sclera nonicteric NECK:  supple, no thyromegaly, no nodes, no carotid bruits CARDIAC: RRR, S1S2+, no murmur. DP 1+B LUNGS: CTAB. No wheezes EXT: 2+ edema BLE w/weeping from L in 3 spots, 1 being an abrasion on medial calf w/surrounding erythema.  No pus, not fluctuant-just clear weeping fluids MSK: no gross abnormalities.  NEURO: A&O x3.  CN II-XII intact.  PSYCH: normal mood. Good eye contact     Assessment & Plan:  Localized edema  Cellulitis of left lower leg  Other orders -     Eplerenone ; Take 1 tablet (25 mg total) by mouth daily.  Dispense: 15 tablet; Refill: 0 -     Mupirocin ; Place 1 Application into the nose 2 (two) times daily.  Dispense: 22 g; Refill: 0 -     Cephalexin ; Take 1 capsule (500 mg total) by mouth 2 (two) times daily for 7 days. Take for 7 days  Dispense: 14 capsule; Refill: 0  Assessment and Plan Assessment & Plan Left Leg Edema and Weeping Lesions and cellulitis of abrasion   He presents with new onset left leg swelling and weeping lesions oozing clear fluid, accompanied by pain to palpation only. The swelling is likely due to fluid retention and possible infection, with a history of pseudogout in the knee considered unrelated. Summer heat and reduced mobility may exacerbate the  swelling. The lesions appear infected, possibly from a scratch, raising concern for worsening infection. Inconsistent eplerenone  use may contribute to increased swelling. Increase eplerenone  to daily until swelling improves, then return to every other day dosing. Prescribe mupirocin  cream for application twice daily to wounds until improvement. Prescribe oral Keflex  twice daily until course completion. Advise leg elevation several times a day and maintain mobility by walking. Instruct to change dressings at least once daily. Report worsening or lack of improvement.  Hypertension   Blood pressure is controlled at 130/76. He is on Xarelto  for atrial fibrillationy.  Diabetes Mellitus   He has diabetes mellitus with increased blood sugar levels from the previous month, attributed to non-adherence to a diabetic diet and weight regain post-cancer treatment. Encourage dietary modifications to manage blood sugar levels.    Return if symptoms worsen or fail to improve.  Jenkins CHRISTELLA Carrel, MD

## 2024-03-01 NOTE — Patient Instructions (Addendum)
 eplerenone  -take daily for several days until the swelling is better then back to every other day  Use the mupirocin  twice/day on the wounds until improving  Antibiotics twice/day till gone  If things are worse or not improving next week, let me know  Elevated legs several times/day  Walk around the house

## 2024-03-08 ENCOUNTER — Ambulatory Visit: Payer: Medicare Other

## 2024-03-15 ENCOUNTER — Other Ambulatory Visit: Payer: Self-pay | Admitting: Family Medicine

## 2024-04-21 ENCOUNTER — Telehealth: Payer: Self-pay | Admitting: Cardiology

## 2024-04-21 DIAGNOSIS — R600 Localized edema: Secondary | ICD-10-CM

## 2024-04-21 DIAGNOSIS — I10 Essential (primary) hypertension: Secondary | ICD-10-CM

## 2024-04-21 NOTE — Telephone Encounter (Signed)
 Left a message for [employer] (on [place]) to call the office back.

## 2024-04-21 NOTE — Telephone Encounter (Signed)
 Pt spouse returning call.

## 2024-04-21 NOTE — Telephone Encounter (Signed)
 2nd attempt at trying to reach out to Therisa Needles, and she was unavailable and voicemail was not offered at this time.   Nursing to reach out to her again tomorrow.

## 2024-04-21 NOTE — Telephone Encounter (Signed)
 Pt c/o swelling/edema: STAT if pt has developed SOB within 24 hours  If swelling, where is the swelling located? Ankles and feet   How much weight have you gained and in what time span? No   Have you gained 2 pounds in a day or 5 pounds in a week? No   Do you have a log of your daily weights (if so, list)? Does not take track   Are you currently taking a fluid pill? No   Are you currently SOB? No   Have you traveled recently in a car or plane for an extended period of time? No

## 2024-04-22 MED ORDER — EPLERENONE 25 MG PO TABS: 25.0000 mg | ORAL_TABLET | Freq: Every day | ORAL | 1 refills | Status: AC

## 2024-04-22 NOTE — Telephone Encounter (Signed)
 Called and spoke with wife.   03/01/24 treated for cellulitis by PCP and Eplerenone  increased to daily. Later returned to M/W/F.   Reports bilateral ankles and legs are swollen starting July. Reports it is worse in the afternoon. Reassurance provided Warfarin to Xarelto  would not impact swelling. Requests to to return to Eplerenone  daily. Does have BP cuff at home, has not checked routinely.  Recommend: Increase Eplerenone  to one tablet daily in the morning Labs Wednesday of next week for BMET, BNP Phone call after labs to check in on symptoms   Reche GORMAN Finder, NP

## 2024-04-22 NOTE — Telephone Encounter (Signed)
 Pt's wife returning call from yesterday. Please advise

## 2024-04-27 DIAGNOSIS — I10 Essential (primary) hypertension: Secondary | ICD-10-CM | POA: Diagnosis not present

## 2024-04-27 DIAGNOSIS — R600 Localized edema: Secondary | ICD-10-CM | POA: Diagnosis not present

## 2024-04-28 ENCOUNTER — Ambulatory Visit (HOSPITAL_BASED_OUTPATIENT_CLINIC_OR_DEPARTMENT_OTHER): Payer: Self-pay | Admitting: Family

## 2024-04-28 DIAGNOSIS — R600 Localized edema: Secondary | ICD-10-CM

## 2024-04-28 DIAGNOSIS — I10 Essential (primary) hypertension: Secondary | ICD-10-CM

## 2024-04-28 LAB — BASIC METABOLIC PANEL WITH GFR
BUN/Creatinine Ratio: 11 (ref 10–24)
BUN: 11 mg/dL (ref 8–27)
CO2: 25 mmol/L (ref 20–29)
Calcium: 9.4 mg/dL (ref 8.6–10.2)
Chloride: 102 mmol/L (ref 96–106)
Creatinine, Ser: 0.97 mg/dL (ref 0.76–1.27)
Glucose: 140 mg/dL — ABNORMAL HIGH (ref 70–99)
Potassium: 4.1 mmol/L (ref 3.5–5.2)
Sodium: 142 mmol/L (ref 134–144)
eGFR: 77 mL/min/1.73 (ref >59)

## 2024-04-28 LAB — BRAIN NATRIURETIC PEPTIDE: BNP: 225.9 pg/mL — ABNORMAL HIGH (ref 0.0–100.0)

## 2024-04-28 MED ORDER — FUROSEMIDE 20 MG PO TABS: ORAL_TABLET | ORAL | 0 refills | Status: AC

## 2024-05-16 DIAGNOSIS — M47812 Spondylosis without myelopathy or radiculopathy, cervical region: Secondary | ICD-10-CM | POA: Diagnosis not present

## 2024-05-16 DIAGNOSIS — M19012 Primary osteoarthritis, left shoulder: Secondary | ICD-10-CM | POA: Diagnosis not present

## 2024-05-24 ENCOUNTER — Ambulatory Visit (INDEPENDENT_AMBULATORY_CARE_PROVIDER_SITE_OTHER)

## 2024-05-24 ENCOUNTER — Telehealth (HOSPITAL_BASED_OUTPATIENT_CLINIC_OR_DEPARTMENT_OTHER): Payer: Self-pay | Admitting: *Deleted

## 2024-05-24 DIAGNOSIS — Z7901 Long term (current) use of anticoagulants: Secondary | ICD-10-CM

## 2024-05-24 DIAGNOSIS — I10 Essential (primary) hypertension: Secondary | ICD-10-CM

## 2024-05-24 DIAGNOSIS — R319 Hematuria, unspecified: Secondary | ICD-10-CM

## 2024-05-24 DIAGNOSIS — R600 Localized edema: Secondary | ICD-10-CM | POA: Diagnosis not present

## 2024-05-24 LAB — ECHOCARDIOGRAM COMPLETE
Area-P 1/2: 5.31 cm2
S' Lateral: 1.95 cm

## 2024-05-24 NOTE — Telephone Encounter (Signed)
 Patient in today for Echo Stated since starting the Xarelto  has had blood in urine intermittently. Urine is pinkish in color   Tends to be worse in the morning, some during the day  Happens about 50% of the time  Denies any change in urinary output, burning, or unusual odor.  Has urologist however he has not reached out to regarding this, concerned coming from Xarelto    Will forward to Dr Lonni for review

## 2024-05-25 ENCOUNTER — Ambulatory Visit (HOSPITAL_BASED_OUTPATIENT_CLINIC_OR_DEPARTMENT_OTHER): Payer: Self-pay | Admitting: Family

## 2024-05-25 NOTE — Telephone Encounter (Signed)
 Lonni Slain, MD to Me    05/25/24 10:08 AM Can we check a CBC? And he needs to reach out to his urologist--he needs to be on long term anticoagulation for his afib, so we need to figure out why he's bleeding. Thanks.  Advised patient, verbalized understanding

## 2024-06-06 ENCOUNTER — Other Ambulatory Visit: Payer: Self-pay | Admitting: Family Medicine

## 2024-06-23 ENCOUNTER — Other Ambulatory Visit: Payer: Self-pay | Admitting: Internal Medicine

## 2024-06-24 NOTE — Telephone Encounter (Signed)
 Prescription refill request for Xarelto  received.  Indication:afib Last office visit:5/25 Weight:81.3  kg Age:83 Scr:0.97  8/25 CrCl:65.19  ml/min  Prescription refilled

## 2024-06-30 DIAGNOSIS — Z23 Encounter for immunization: Secondary | ICD-10-CM | POA: Diagnosis not present

## 2024-07-08 ENCOUNTER — Telehealth: Admitting: Physician Assistant

## 2024-07-08 DIAGNOSIS — K0889 Other specified disorders of teeth and supporting structures: Secondary | ICD-10-CM

## 2024-07-08 NOTE — Progress Notes (Signed)
  Because of your concern, I feel your condition warrants further evaluation and I recommend that you be seen in a face-to-face visit.   NOTE: There will be NO CHARGE for this E-Visit   If you are having a true medical emergency, please call 911.     For an urgent face to face visit, [place] has multiple urgent care centers for your convenience.  Click the link below for the full list of locations and hours, walk-in wait times, appointment scheduling options and driving directions:  Urgent Care - [place], [place], [place], [place], [place], [place]  [place]     Your MyChart E-visit questionnaire answers were reviewed by a board certified advanced clinical practitioner to complete your personal care plan based on your specific symptoms.    Thank you for using e-Visits.

## 2024-08-01 ENCOUNTER — Other Ambulatory Visit (HOSPITAL_COMMUNITY): Payer: Self-pay | Admitting: Sports Medicine

## 2024-08-01 DIAGNOSIS — R29898 Other symptoms and signs involving the musculoskeletal system: Secondary | ICD-10-CM | POA: Diagnosis not present

## 2024-08-01 DIAGNOSIS — G8929 Other chronic pain: Secondary | ICD-10-CM | POA: Diagnosis not present

## 2024-08-01 DIAGNOSIS — M19012 Primary osteoarthritis, left shoulder: Secondary | ICD-10-CM | POA: Diagnosis not present

## 2024-08-11 ENCOUNTER — Encounter: Payer: Self-pay | Admitting: Physical Therapy

## 2024-08-11 ENCOUNTER — Other Ambulatory Visit: Payer: Self-pay

## 2024-08-11 ENCOUNTER — Ambulatory Visit: Attending: Sports Medicine | Admitting: Physical Therapy

## 2024-08-11 DIAGNOSIS — M25512 Pain in left shoulder: Secondary | ICD-10-CM | POA: Diagnosis present

## 2024-08-11 DIAGNOSIS — R2681 Unsteadiness on feet: Secondary | ICD-10-CM | POA: Diagnosis present

## 2024-08-11 DIAGNOSIS — M25612 Stiffness of left shoulder, not elsewhere classified: Secondary | ICD-10-CM | POA: Diagnosis present

## 2024-08-11 DIAGNOSIS — M6281 Muscle weakness (generalized): Secondary | ICD-10-CM | POA: Diagnosis present

## 2024-08-11 DIAGNOSIS — G8929 Other chronic pain: Secondary | ICD-10-CM | POA: Diagnosis present

## 2024-08-11 DIAGNOSIS — R2689 Other abnormalities of gait and mobility: Secondary | ICD-10-CM | POA: Insufficient documentation

## 2024-08-11 NOTE — Therapy (Signed)
 OUTPATIENT PHYSICAL THERAPY LOWER EXTREMITY EVALUATION   Patient Name: Larry Bautista MRN: [id number] DOB:08-Nov-1940, 83 y.o., male Today's Date: 08/11/2024  END OF SESSION:   Past Medical History:  Diagnosis Date   Allergy     Arthritis    Basal cell carcinoma    L face, txted in [place] yrs ago   Blood transfusion without reported diagnosis    Cancer (HCC)    esoph-chemo and rad   CHF (congestive heart failure) (HCC)    Chronic knee pain    Coronary artery disease    with LAD DE stent 2004   Dysplastic nevus 03/26/2016   right mid med calf   Dysrhythmia    Heel spur, left    Hyperlipidemia    Hypertension    Kidney stones    Lower back pain    status post surgery for spondylolisthesis   Migraine    Persistent atrial fibrillation (HCC)    longstanding persistent (since 05/2008)   Plantar fasciitis    Past Surgical History:  Procedure Laterality Date   BACK SURGERY     spondylolisthesis   CARDIAC CATHETERIZATION  03/06/2003   CORONARY ANGIOPLASTY WITH STENT PLACEMENT  04/09/2003   CYPHER stent implantation in the LAD   ESOPHAGOGASTRODUODENOSCOPY (EGD) WITH PROPOFOL  N/A 01/07/2018   Procedure: ESOPHAGOGASTRODUODENOSCOPY (EGD) WITH PROPOFOL ;  Surgeon: Lennard Lesta FALCON, MD;  Location: [facility] ENDOSCOPY;  Service: Endoscopy;  Laterality: N/A;   HAND SURGERY Right 09/08/2012   carpal tunnel   HERNIA REPAIR     INGUINAL HERNIA REPAIR Right 04/26/2020   Procedure: RIGHT INGUINAL HERNIA REPAIR WITH MESH;  Surgeon: Vernetta Berg, MD;  Location: [facility];  Service: General;  Laterality: Right;   REVERSE SHOULDER ARTHROPLASTY Right 03/26/2023   Procedure: REVERSE SHOULDER ARTHROPLASTY;  Surgeon: Cristy Bonner DASEN, MD;  Location: [facility];  Service: Orthopedics;  Laterality: Right;   SKIN CANCER EXCISION  05/10/1979   Face   SPINE SURGERY  2000   VASCULAR SURGERY     Vascular Stent   Patient Active Problem List   Diagnosis Date Noted    Gait disorder 01/14/2023   Hematoma of left knee region 08/22/2019   Knee pain 08/21/2019   Osteoarthritis of right shoulder 10/15/2018   Pulmonary nodule 09/30/2018   Adenocarcinoma of gastroesophageal junction (HCC) 01/14/2018   MRSA nasal colonization 01/12/2018   Acute GI bleeding 01/07/2018   Acute blood loss anemia 01/07/2018   COPD with chronic bronchitis (HCC) 12/08/2017   Dyspnea 10/01/2016   Abnormal PFTs 10/01/2016   Hyperglycemia 03/28/2015   Allergic rhinitis 01/24/2015   Arthritis 01/24/2015   Basal cell carcinoma-1990 01/24/2015   BPH (benign prostatic hyperplasia) 01/24/2015   BMI 29.0-29.9,adult 01/24/2015   H/O renal calculi 01/24/2015   Headache, migraine 01/24/2015   AF (paroxysmal atrial fibrillation) (HCC) 01/24/2015   Spinal stenosis 01/24/2015   SPL (spondylolisthesis) 01/24/2015   Benign paroxysmal positional nystagmus 01/24/2015   Encounter for therapeutic drug monitoring 11/02/2013   Hyperlipidemia 05/03/2009   MIGRAINE HEADACHE 05/03/2009   Essential hypertension 05/03/2009   CAD in native artery 05/03/2009   KNEE PAIN 05/03/2009   Backache 05/03/2009   History of tobacco use 05/03/2009    PCP: Wendolyn Jenkins Jansky, MD  REFERRING PROVIDER: Orpha Asberry RAMAN, MD  REFERRING DIAG: R29.898 (ICD-10-CM) - Other symptoms and signs involving the musculoskeletal system R29.898 (ICD-10-CM) - Muscular deconditioning  THERAPY DIAG:  No diagnosis found.  Rationale for Evaluation and Treatment: Rehabilitation  ONSET DATE: 5 years  ago  SUBJECTIVE:   SUBJECTIVE STATEMENT: Pt is planning on having L shoulder surgery; reports pain is now going up to the side of his neck. Pt reports difficulty with getting in/out of chair with some pain in his knees (feels this is also arthritis). Does note some unsteadiness. No date determined for his surgery but will be after Christmas. Normally sleeps on his L side and pushes up from the left to get in/out of bed. L hand  dominant  PERTINENT HISTORY: R shoulder replacement 2024, Spinal fusion 2020, history of chemo/radiation, high blood pressure, A-fib  PAIN:  Are you having pain? Yes: NPRS scale: 2/10 currently Pain location: L side neck pain Pain description: dull/achy Aggravating factors: turning head Relieving factors: tylenol   PRECAUTIONS: Fall  RED FLAGS: None   WEIGHT BEARING RESTRICTIONS: No  FALLS:  Has patient fallen in last 6 months? Yes. Number of falls 2 -- I slipped a couple of times; occurred when getting out of bed  LIVING ENVIRONMENT: Lives with: lives with their spouse Lives in: House/apartment Stairs: 15 and 11 steps with hand rails but pt unable to grasp; has fallen down the steps Has following equipment at home: Single point cane  OCCUPATION: Retired  PLOF: Independent  PATIENT GOALS: Decrease pain, less difficulty with work activities, improve movement, improve strength, less difficulty with home activites  NEXT MD VISIT: As needed  OBJECTIVE:  Note: Objective measures were completed at Evaluation unless otherwise noted.  DIAGNOSTIC FINDINGS: L shoulder CT scheduled 08/19/24  PATIENT SURVEYS:  Did not provide  POSTURE: rounded shoulders, forward head, and increased thoracic kyphosis  PALPATION: TTP L UT  UPPER EXTREMITY ROM:  Active ROM Right eval Left eval  Shoulder flexion 105 65  Shoulder extension 55 50  Shoulder abduction 108 72  Shoulder adduction    Shoulder extension    Shoulder internal rotation sacrum Posterior L hip  Shoulder external rotation T1 C5  Elbow flexion    Elbow extension    Wrist flexion    Wrist extension    Wrist ulnar deviation    Wrist radial deviation    Wrist pronation    Wrist supination     (Blank rows = not tested)  UPPER EXTREMITY MMT:  MMT Right eval Left eval  Shoulder flexion 4 4 p!  Shoulder extension    Shoulder abduction 4 4 p!  Shoulder adduction    Shoulder extension    Shoulder internal  rotation 5 5  Shoulder external rotation 3+ 3+  Middle trapezius    Lower trapezius    Elbow flexion    Elbow extension    Wrist flexion    Wrist extension    Wrist ulnar deviation    Wrist radial deviation    Wrist pronation    Wrist supination    Grip strength     (Blank rows = not tested)   LOWER EXTREMITY MMT:  MMT Right eval Left eval  Hip flexion 3+ 3+  Hip extension 3+ 3+  Hip abduction 3+ 3+  Hip adduction 3+ 3+  Hip internal rotation    Hip external rotation    Knee flexion 4 4  Knee extension 4 4  Ankle dorsiflexion    Ankle plantarflexion    Ankle inversion    Ankle eversion     (Blank rows = not tested)  FUNCTIONAL TESTS:  5 times sit to stand: 15.62 sec with UEs  GAIT: Distance walked: Into clinic Assistive device utilized: Single point cane on L  Level of assistance: SBA Comments: 10.11 sec in 24' = 0.72 m/s. Antalgic, wide BOS with hips in slight ER                                                                                                                                TREATMENT DATE: 08/11/24 Self care: see education below    PATIENT EDUCATION:  Education details: Exam findings, POC, looking for other things to work on to ready him for L shoulder replacement Person educated: Patient Education method: Medical Illustrator Education comprehension: verbalized understanding and returned demonstration  HOME EXERCISE PROGRAM: To be initiated  ASSESSMENT:  CLINICAL IMPRESSION: Patient is a 83 y.o. M who was seen today for physical therapy evaluation and treatment for deconditioning and unsteady gait. Pt is L hand dominant but has plans for a L shoulder replacement some time after Christmas. Assessment is significant for general weakness in bilat LEs, high fall risk based on his 5x STS as well as decreased posterior shoulder strength. Pt currently uses a cane in his L UE for stability. Pt will benefit from PT to ready pt for surgery  when he will be unable to use his L UE for a period of time to reduce caregiver burden and maximize pt's mobility and safety at home.   OBJECTIVE IMPAIRMENTS: Abnormal gait, decreased activity tolerance, decreased balance, decreased coordination, decreased endurance, decreased mobility, difficulty walking, decreased ROM, decreased strength, hypomobility, impaired UE functional use, improper body mechanics, postural dysfunction, and pain.   ACTIVITY LIMITATIONS: carrying, lifting, standing, squatting, stairs, transfers, bed mobility, bathing, toileting, dressing, reach over head, hygiene/grooming, and locomotion level  PARTICIPATION LIMITATIONS: meal prep, cleaning, laundry, driving, and community activity  PERSONAL FACTORS: Age, Fitness, Past/current experiences, and Time since onset of injury/illness/exacerbation are also affecting patient's functional outcome.   REHAB POTENTIAL: Good  CLINICAL DECISION MAKING: Evolving/moderate complexity  EVALUATION COMPLEXITY: Moderate   GOALS: Goals reviewed with patient? Yes  SHORT TERM GOALS: Target date: 09/08/2024  Pt will be ind with initial HEP Baseline: Goal status: INITIAL  2.  Pt will be able to perform 5x STS without UE assist  Baseline:  Goal status: INITIAL   LONG TERM GOALS: Target date: 10/06/2024   Pt will be ind with management and progression of HEP Baseline:  Goal status: INITIAL  2.  Pt will be able to perform 5x STS in </=13 sec for reduced fall risk Baseline:  Goal status: INITIAL  3.  Pt will be able to amb with cane in his R UE with a gait speed of >/=0.83 m/s to be within his age norms Baseline:  Goal status: INITIAL  4.  Pt will be able to transfer in/out of bed with only using his R UE to push up Baseline:  Goal status: INITIAL  5.  Pt will demo at least 4/5 L posterior shoulder strength (I.e. ER, ext and mid/low traps) for improved proximal stability prior to his  surgery Baseline:  Goal status:  INITIAL    PLAN:  PT FREQUENCY: 2x/week  PT DURATION: 8 weeks  PLANNED INTERVENTIONS: 97164- PT Re-evaluation, 97750- Physical Performance Testing, 97110-Therapeutic exercises, 97530- Therapeutic activity, [id number]- Neuromuscular re-education, 97535- Self Care, [id number]- Manual therapy, [id number]- Gait training, [phone]- Electrical stimulation (unattended), 97016- Vasopneumatic device, [id number]- Ultrasound, 79439 (1-2 muscles), 20561 (3+ muscles)- Dry Needling, Patient/Family education, Balance training, Stair training, Taping, Joint mobilization, Spinal mobilization, Cryotherapy, and Moist heat  PLAN FOR NEXT SESSION: Initiate bilat LE strengthening (especially hips for sit<>stands). All transfers without L UE assist. Posterior shoulder strengthening. Balance/postural stability.   Desarea Ohagan April Ma L Codee Bloodworth, PT, DPT 08/11/2024, 7:47 AM

## 2024-08-15 ENCOUNTER — Ambulatory Visit: Admitting: Physical Therapy

## 2024-08-18 ENCOUNTER — Ambulatory Visit: Admitting: Physical Therapy

## 2024-08-19 ENCOUNTER — Ambulatory Visit (HOSPITAL_BASED_OUTPATIENT_CLINIC_OR_DEPARTMENT_OTHER)

## 2024-08-22 ENCOUNTER — Ambulatory Visit: Admitting: *Deleted

## 2024-08-25 ENCOUNTER — Ambulatory Visit

## 2024-08-26 ENCOUNTER — Telehealth: Payer: Self-pay | Admitting: Family Medicine

## 2024-08-26 NOTE — Telephone Encounter (Signed)
 Spoke with patients spouse Therisa and she would like someone to give them a call back ASAP. Patient is having pain in his neck and she is hoping he can be seen.  Thank You  Darice FORBES [facility] AWV TEAM Direct Dial [phone]

## 2024-08-29 ENCOUNTER — Ambulatory Visit

## 2024-09-02 NOTE — Telephone Encounter (Signed)
 noted

## 2024-09-02 NOTE — Telephone Encounter (Signed)
 Called pt and spouse answered. Spouse informed me that they ended up going to patient's neurologist and got a medication. Patient is now stable and managing well.

## 2024-09-05 ENCOUNTER — Ambulatory Visit: Payer: Self-pay

## 2024-09-05 NOTE — Telephone Encounter (Signed)
 FYI Only or Action Required?: FYI only for provider: appointment scheduled on 12/30.  Patient was last seen in primary care on 03/01/2024 by Wendolyn Jenkins Jansky, MD.  Called Nurse Triage reporting Cough.  Symptoms began a week ago.  Interventions attempted: OTC medications: delsum.  Symptoms are: stable.  Triage Disposition: Home Care  Patient/caregiver understands and will follow disposition?: Yes    Copied from CRM #[id number]. Topic: Clinical - Red Word Triage >> Sep 05, 2024  1:48 PM Hadassah PARAS wrote: Red Word that prompted transfer to Nurse Triage: Pt is experiencing a severe cough, wheezing, not feeling well. Transferred to NT Reason for Disposition  Cough  Answer Assessment - Initial Assessment Questions 1. ONSET: When did the cough begin?      1 weeks 2. SEVERITY: How bad is the cough today?      Sleeping well, has coughing spells 3. SPUTUM: Describe the color of your sputum (e.g., none, dry cough; clear, white, yellow, green)     no 4. HEMOPTYSIS: Are you coughing up any blood? If Yes, ask: How much? (e.g., flecks, streaks, tablespoons, etc.)     no 5. DIFFICULTY BREATHING: Are you having difficulty breathing? If Yes, ask: How bad is it? (e.g., mild, moderate, severe)      denies 6. FEVER: Do you have a fever? If Yes, ask: What is your temperature, how was it measured, and when did it start?     no 7. CARDIAC HISTORY: Do you have any history of heart disease? (e.g., heart attack, congestive heart failure)      CAD 8. LUNG HISTORY: Do you have any history of lung disease?  (e.g., pulmonary embolus, asthma, emphysema)     COPD 9. PE RISK FACTORS: Do you have a history of blood clots? (or: recent major surgery, recent prolonged travel, bedridden)      10. OTHER SYMPTOMS: Do you have any other symptoms? (e.g., runny nose, wheezing, chest pain)       Denies, feels very fatigued  Protocols used: Cough - Acute Non-Productive-A-AH

## 2024-09-06 ENCOUNTER — Ambulatory Visit: Admitting: Student in an Organized Health Care Education/Training Program

## 2024-09-06 ENCOUNTER — Encounter: Payer: Self-pay | Admitting: Student in an Organized Health Care Education/Training Program

## 2024-09-06 ENCOUNTER — Ambulatory Visit (HOSPITAL_BASED_OUTPATIENT_CLINIC_OR_DEPARTMENT_OTHER)
Admission: RE | Admit: 2024-09-06 | Discharge: 2024-09-06 | Disposition: A | Discharge status: Home / Self Care | Source: Ambulatory Visit | Attending: Student in an Organized Health Care Education/Training Program | Admitting: Student in an Organized Health Care Education/Training Program

## 2024-09-06 VITALS — BP 140/95 | HR 86 | Ht 67.0 in | Wt 172.6 lb

## 2024-09-06 DIAGNOSIS — J189 Pneumonia, unspecified organism: Secondary | ICD-10-CM | POA: Insufficient documentation

## 2024-09-06 MED ORDER — AMOXICILLIN-POT CLAVULANATE 875-125 MG PO TABS: 1.0000 | ORAL_TABLET | Freq: Two times a day (BID) | ORAL | 0 refills | Status: AC

## 2024-09-06 NOTE — Progress Notes (Signed)
 "  Acute Office Visit  Patient ID: Larry Bautista, male    DOB: 03-20-1941, 83 y.o.   MRN: [id number]  PCP: Wendolyn Jenkins Jansky, MD  Chief Complaint  Patient presents with   Cough    Sx started 3 weeks ago. Coughing up clear mucus. SOB when moving around a lot. Sx of esophagus cancer    Subjective:     HPI  Discussed the use of AI scribe software for clinical note transcription with the patient, who gave verbal consent to proceed.  History of Present Illness Larry Bautista is an 83 year old male with esophageal cancer who presents with a worsening cough.  He has been experiencing a persistent cough that has worsened over the past three to four weeks. The cough occurs in spasms and does not disturb his sleep at night. No fever, but he has some shortness of breath and significant congestion. He has not previously sought medical attention for this issue and has been managing the symptoms independently. Occasionally, he brings up clear sputum with his cough but denies any blood in it.  His medical history includes esophageal cancer, treated with chemotherapy and radiation three years ago. He reports that he has scar tissue from prior radiation and wonders if this could be related to his cough. He denies any current issues with swallowing or throat pain and reports eating and drinking well.  He has a history of atrial fibrillation and is currently taking Xarelto , having switched from warfarin earlier this year. He also has arthritis in his neck and shoulders, with a right shoulder replacement performed about a year and a half ago and plans for a left shoulder replacement. He experiences pain in his arms and hands, particularly on the left side.  He quit smoking approximately forty years ago. He resides in [place] and manages his healthcare primarily in [place], with his pharmacy located in [place].  During the review of symptoms, no ear pain, sinus pain, nausea, diarrhea, or  abdominal pain, except when coughing hard. He reports a clear nasal discharge and a history of a runny nose since chemotherapy, which he attributes to chronic damage. No known lung problems such as asthma or COPD and has never used inhalers.      Objective:    BP (!) 140/95 (BP Location: Left Arm, Patient Position: Sitting, Cuff Size: Large)   Pulse 86   Ht 5' 7 (1.702 m)   Wt 172 lb 9.6 oz (78.3 kg)   BMI 27.03 kg/m   Physical Exam  Gen: Frail and weak appearing older man Ears: Moderate not impacting ceruminosis, normal tympanic membranes Mouth: No oral lesions, no posterior oropharynx erythema Heart: Irregular, 2 out of 6 early stock murmur best heard at the right upper sternal border Lungs: Mildly labored breathing after getting onto the table, lungs are clear on the left, on the right side he has coarse crackles that are moderate at the right base, no wheezing Ext: Warm, no edema      Assessment & Plan:   Problem List Items Addressed This Visit       Unprioritized   CAP (community acquired pneumonia) - Primary   History and exam are most consistent with a progressive lower respiratory tract infection.  Based on the crackles at his right base and his declining functional status, I think he likely has community-acquired pneumonia.  I recommended a chest x-ray to evaluate for opacities.  We talked about risks and benefits of antibiotics, decided to  go ahead and treat with Augmentin twice daily for 7 days.  Because of his high fall risk I recommended against codeine-based cough suppression.  We talked about potential complications of pneumonia in someone as frail as he, and reasons to go to the emergency department such as dehydration or declining mental status.  Given his high degree of frailty, he is at risk for hospitalization or poor outcome despite our best efforts.      Relevant Medications   amoxicillin -clavulanate (AUGMENTIN) 875-125 MG tablet   Other Relevant Orders    DG Chest 2 View    Meds ordered this encounter  Medications   amoxicillin -clavulanate (AUGMENTIN) 875-125 MG tablet    Sig: Take 1 tablet by mouth 2 (two) times daily for 7 days.    Dispense:  14 tablet    Refill:  0    Return if symptoms worsen or fail to improve.  Cleatus Debby Specking, MD [facility]   "

## 2024-09-06 NOTE — Patient Instructions (Signed)
" °  VISIT SUMMARY: Today, you were seen for a worsening cough that has been persistent for the past three to four weeks. You have a history of esophageal cancer, atrial fibrillation, and osteoarthritis in your neck and shoulders. During the visit, we discussed your symptoms and reviewed your medical history.  YOUR PLAN: -SUSPECTED PNEUMONIA: Pneumonia is an infection that inflames the air sacs in one or both lungs. You have been experiencing a chronic cough, shortness of breath, and clear sputum. We have ordered a chest x-ray to check for pneumonia and prescribed Augmentin, which you should take one pill twice daily for 7 days. Please monitor your symptoms and seek emergency care if they worsen.  -ATRIAL FIBRILLATION: Atrial fibrillation is an irregular and often rapid heart rate that can increase your risk of strokes, heart failure, and other heart-related complications. Your condition is currently managed with Xarelto , a blood thinner.  -OSTEOARTHRITIS OF CERVICAL SPINE AND SHOULDERS, POST RIGHT SHOULDER REPLACEMENT, PLANNED LEFT SHOULDER REPLACEMENT: Osteoarthritis is a type of arthritis that occurs when flexible tissue at the ends of bones wears down. You have osteoarthritis in your neck and shoulders, and you have had a right shoulder replacement. A left shoulder replacement is planned for the future.  INSTRUCTIONS: Please follow up with the chest x-ray as ordered. Take Augmentin as prescribed, one pill twice daily for 7 days. Monitor your symptoms and seek emergency care if they worsen. Continue managing your atrial fibrillation with Xarelto  as directed. For your osteoarthritis, continue with your current management plan and prepare for your upcoming left shoulder replacement.    "

## 2024-09-06 NOTE — Assessment & Plan Note (Signed)
 History and exam are most consistent with a progressive lower respiratory tract infection.  Based on the crackles at his right base and his declining functional status, I think he likely has community-acquired pneumonia.  I recommended a chest x-ray to evaluate for opacities.  We talked about risks and benefits of antibiotics, decided to go ahead and treat with Augmentin twice daily for 7 days.  Because of his high fall risk I recommended against codeine-based cough suppression.  We talked about potential complications of pneumonia in someone as frail as he, and reasons to go to the emergency department such as dehydration or declining mental status.  Given his high degree of frailty, he is at risk for hospitalization or poor outcome despite our best efforts.

## 2024-09-13 ENCOUNTER — Other Ambulatory Visit: Payer: Self-pay | Admitting: Cardiology

## 2024-09-19 ENCOUNTER — Ambulatory Visit: Payer: Self-pay | Admitting: Student in an Organized Health Care Education/Training Program

## 2024-09-19 ENCOUNTER — Ambulatory Visit

## 2024-09-19 VITALS — Ht 69.0 in | Wt 168.0 lb

## 2024-09-19 DIAGNOSIS — Z0000 Encounter for general adult medical examination without abnormal findings: Secondary | ICD-10-CM | POA: Diagnosis not present

## 2024-09-19 NOTE — Patient Instructions (Signed)
 Mr. Kassabian,  Thank you for taking the time for your Medicare Wellness Visit. I appreciate your continued commitment to your health goals. Please review the care plan we discussed, and feel free to reach out if I can assist you further.  Please note that Annual Wellness Visits do not include a physical exam. Some assessments may be limited, especially if the visit was conducted virtually. If needed, we may recommend an in-person follow-up with your provider.  Ongoing Care Seeing your primary care provider every 3 to 6 months helps us  monitor your health and provide consistent, personalized care.   Referrals If a referral was made during today's visit and you haven't received any updates within two weeks, please contact the referred provider directly to check on the status.  Recommended Screenings:  Health Maintenance  Topic Date Due   Medicare Annual Wellness Visit  02/19/2024   COVID-19 Vaccine (6 - 2025-26 season) 05/09/2024   DTaP/Tdap/Td vaccine (3 - Td or Tdap) 10/14/2024*   Pneumococcal Vaccine for age over 68  Completed   Flu Shot  Completed   Zoster (Shingles) Vaccine  Completed   Meningitis B Vaccine  Aged Out  *Topic was postponed. The date shown is not the original due date.       08/11/2024   11:02 AM  Advanced Directives  Does Patient Have a Medical Advance Directive? Yes    Vision: Annual vision screenings are recommended for early detection of glaucoma, cataracts, and diabetic retinopathy. These exams can also reveal signs of chronic conditions such as diabetes and high blood pressure.  Dental: Annual dental screenings help detect early signs of oral cancer, gum disease, and other conditions linked to overall health, including heart disease and diabetes.  Please see the attached documents for additional preventive care recommendations.

## 2024-09-19 NOTE — Progress Notes (Signed)
 "  Chief Complaint  Patient presents with   Medicare Wellness     Subjective:   Larry Bautista is a 84 y.o. male who presents for a Medicare Annual Wellness Visit.  Visit info / Clinical Intake: Medicare Wellness Visit Type:: Subsequent Annual Wellness Visit Persons participating in visit and providing information:: patient Medicare Wellness Visit Mode:: Telephone Since this visit was completed virtually, some vitals may be partially provided or unavailable. Missing vitals are due to the limitations of the virtual format.: Unable to obtain vitals - no equipment If Telephone or Video please confirm:: I connected with patient using audio/video enable telemedicine. I verified patient identity with two identifiers, discussed telehealth limitations, and patient agreed to proceed. Patient Location:: home Provider Location:: office Interpreter Needed?: No Pre-visit prep was completed: yes AWV questionnaire completed by patient prior to visit?: yes Date:: 09/18/24 Living arrangements:: (Patient-Rptd) lives with spouse/significant other Patient's Overall Health Status Rating: (!) (Patient-Rptd) fair Typical amount of pain: (!) (Patient-Rptd) a lot Does pain affect daily life?: (!) (Patient-Rptd) yes Are you currently prescribed opioids?: no  Dietary Habits and Nutritional Risks How many meals a day?: (Patient-Rptd) 3 Eats fruit and vegetables daily?: (Patient-Rptd) yes Most meals are obtained by: (Patient-Rptd) having others provide food Diabetic:: no  Functional Status Activities of Daily Living (to include ambulation/medication): (!) (Patient-Rptd) Needs Assist Ambulation: Independent with device- listed below Home Assistive Devices/Equipment: Cane; Eyeglasses Medication Administration: Independent Home Management (perform basic housework or laundry): (Patient-Rptd) Independent Manage your own finances?: (Patient-Rptd) yes Primary transportation is: (Patient-Rptd) driving; family /  friends Concerns about vision?: no *vision screening is required for WTM* Concerns about hearing?: (!) yes Uses hearing aids?: (!) yes Hear whispered voice?: yes  Fall Screening Falls in the past year?: (Patient-Rptd) 1 Number of falls in past year: (Patient-Rptd) 1 Was there an injury with Fall?: (Patient-Rptd) 0 Fall Risk Category Calculator: (Patient-Rptd) 2 Patient Fall Risk Level: (Patient-Rptd) Moderate Fall Risk  Fall Risk Patient at Risk for Falls Due to: History of fall(s); Impaired balance/gait Fall risk Follow up: Falls evaluation completed  Home and Transportation Safety: All rugs have non-skid backing?: (Patient-Rptd) yes All stairs or steps have railings?: (Patient-Rptd) yes Grab bars in the bathtub or shower?: (Patient-Rptd) yes Have non-skid surface in bathtub or shower?: (Patient-Rptd) yes Good home lighting?: (Patient-Rptd) yes Regular seat belt use?: (Patient-Rptd) yes Hospital stays in the last year:: (Patient-Rptd) no  Cognitive Assessment Difficulty concentrating, remembering, or making decisions? : (Patient-Rptd) yes Will 6CIT or Mini Cog be Completed: yes What year is it?: 0 points What month is it?: 0 points Give patient an address phrase to remember (5 components): [address] [place]  About what time is it?: 0 points Count backwards from 20 to 1: 0 points Say the months of the year in reverse: 0 points Repeat the address phrase from earlier: 0 points 6 CIT Score: 0 points  Advance Directives (For Healthcare) Does Patient Have a Medical Advance Directive?: Yes Type of Advance Directive: Healthcare Power of Attorney Copy of Healthcare Power of Attorney in Chart?: Yes - validated most recent copy scanned in chart (See row information) Would patient like information on creating a medical advance directive?: No - Patient declined  Reviewed/Updated  Reviewed/Updated: Reviewed All (Medical, Surgical, Family, Medications, Allergies, Care Teams, Patient  Goals)    Allergies (verified) Celecoxib, Lisinopril , and Simvastatin   Current Medications (verified) Outpatient Encounter Medications as of 09/19/2024  Medication Sig   cetirizine  (ZYRTEC ) 10 MG tablet Take 1 tablet (10  mg total) by mouth daily.   ciclopirox  (PENLAC ) 8 % solution Apply topically at bedtime. Apply over nail and surrounding skin. Apply daily over previous coat. After seven (7) days, may remove with alcohol  and continue cycle.   diclofenac  Sodium (VOLTAREN ) 1 % GEL Apply 4 g topically 4 (four) times daily.   diphenhydramine -acetaminophen  (TYLENOL  PM) 25-500 MG TABS tablet Take 1 tablet by mouth at bedtime as needed.   eplerenone  (INSPRA ) 25 MG tablet Take 1 tablet (25 mg total) by mouth daily.   fluticasone  (FLONASE ) 50 MCG/ACT nasal spray USE 2 SPRAYS IN EACH NOSTRIL DAILY   gabapentin  (NEURONTIN ) 100 MG capsule Take 100 mg by mouth daily.   mupirocin  ointment (BACTROBAN ) 2 % Place 1 Application into the nose 2 (two) times daily.   nitroGLYCERIN  (NITROSTAT ) 0.4 MG SL tablet DISSOLVE 1 TAB UNDER TOUNGE FOR CHEST PAIN. MAY REPEAT EVERY 5 MINUTES FOR 3 DOSES. IF NO RELIEF CALL 911 OR GO TO ER   psyllium (METAMUCIL) 58.6 % powder Take 1 packet by mouth daily.    rosuvastatin  (CRESTOR ) 20 MG tablet TAKE ONE (1) TABLET BY MOUTH EVERY DAY   tamsulosin  (FLOMAX ) 0.4 MG CAPS capsule Take 0.4 mg by mouth daily after supper.   tiZANidine (ZANAFLEX) 4 MG tablet Take 4 mg by mouth daily.   vitamin C  (ASCORBIC ACID ) 500 MG tablet Take 500 mg by mouth daily.   XARELTO  20 MG TABS tablet TAKE 1 TABLET BY MOUTH DAILY WITH SUPPER   furosemide  (LASIX ) 20 MG tablet TAKE 1 TABLET DAILY FOR 3 DAYS ONLY THEN AS NEEDED FOR WEIGHT GAIN OF 2 POUNDS IN 24 HOUR OR 5 POUNDS IN A WEEK. (Patient not taking: Reported on 09/19/2024)   No facility-administered encounter medications on file as of 09/19/2024.    History: Past Medical History:  Diagnosis Date   Allergy     Arthritis    Basal cell carcinoma     L face, txted in [place] yrs ago   Blood transfusion without reported diagnosis    Cancer (HCC)    esoph-chemo and rad   Cataract    CHF (congestive heart failure) (HCC)    Chronic knee pain    Coronary artery disease    with LAD DE stent 2004   Dysplastic nevus 03/26/2016   right mid med calf   Dysrhythmia    Heel spur, left    Hyperlipidemia    Hypertension    Kidney stones    Lower back pain    status post surgery for spondylolisthesis   Migraine    Persistent atrial fibrillation (HCC)    longstanding persistent (since 05/2008)   Plantar fasciitis    Past Surgical History:  Procedure Laterality Date   BACK SURGERY     spondylolisthesis   CARDIAC CATHETERIZATION  03/06/2003   CORONARY ANGIOPLASTY WITH STENT PLACEMENT  04/09/2003   CYPHER stent implantation in the LAD   ESOPHAGOGASTRODUODENOSCOPY (EGD) WITH PROPOFOL  N/A 01/07/2018   Procedure: ESOPHAGOGASTRODUODENOSCOPY (EGD) WITH PROPOFOL ;  Surgeon: Lennard Lesta FALCON, MD;  Location: [facility] ENDOSCOPY;  Service: Endoscopy;  Laterality: N/A;   HAND SURGERY Right 09/08/2012   carpal tunnel   HERNIA REPAIR     INGUINAL HERNIA REPAIR Right 04/26/2020   Procedure: RIGHT INGUINAL HERNIA REPAIR WITH MESH;  Surgeon: Vernetta Berg, MD;  Location: [facility];  Service: General;  Laterality: Right;   JOINT REPLACEMENT     REVERSE SHOULDER ARTHROPLASTY Right 03/26/2023   Procedure: REVERSE SHOULDER ARTHROPLASTY;  Surgeon: Varkey, Dax  T, MD;  Location: [facility];  Service: Orthopedics;  Laterality: Right;   SKIN CANCER EXCISION  05/10/1979   Face   SPINE SURGERY  2000   VASCULAR SURGERY     Vascular Stent   Family History  Problem Relation Age of Onset   Lung cancer Mother    Cancer Mother    Heart attack Father    Colon cancer Father    Congestive Heart Failure Father    Cancer Father    Atrial fibrillation Brother    Stroke Brother    Bladder Cancer Brother    Cancer Brother    COPD  Brother    Atrial fibrillation Brother    COPD Brother    Congestive Heart Failure Brother    Lung cancer Brother    Arthritis Brother    Cancer Brother    Migraines Daughter    Social History   Occupational History   Occupation: retired  Tobacco Use   Smoking status: Former    Current packs/day: 0.00    Average packs/day: 2.0 packs/day for 35.2 years (70.3 ttl pk-yrs)    Types: Cigarettes    Start date: 09/08/1954    Quit date: 11/06/1989    Years since quitting: 34.8   Smokeless tobacco: Never   Tobacco comments:    Smoked from [phone] (35 years)  Vaping Use   Vaping status: Never Used  Substance and Sexual Activity   Alcohol  use: Not Currently    Comment: 1961-2009 (Last drink 2009)    Drug use: Never   Sexual activity: Yes    Birth control/protection: None   Tobacco Counseling Counseling given: Not Answered Tobacco comments: Smoked from [phone] (35 years)  SDOH Screenings   Food Insecurity: No Food Insecurity (09/19/2024)  Housing: Unknown (09/19/2024)  Transportation Needs: No Transportation Needs (09/19/2024)  Utilities: Not At Risk (09/19/2024)  Depression (PHQ2-9): Low Risk (09/19/2024)  Financial Resource Strain: Low Risk (09/05/2024)  Physical Activity: Inactive (09/19/2024)  Social Connections: Socially Integrated (09/19/2024)  Stress: No Stress Concern Present (09/19/2024)  Tobacco Use: Medium Risk (09/19/2024)  Health Literacy: Adequate Health Literacy (09/19/2024)   See flowsheets for full screening details  Depression Screen PHQ 2 & 9 Depression Scale- Over the past 2 weeks, how often have you been bothered by any of the following problems? Little interest or pleasure in doing things: 0 Feeling down, depressed, or hopeless (PHQ Adolescent also includes...irritable): 0 PHQ-2 Total Score: 0     Goals Addressed               This Visit's Progress     lose weight (pt-stated)        Lose weight              Objective:    Today's Vitals    09/19/24 0838  Weight: 168 lb (76.2 kg)  Height: 5' 9 (1.753 m)   Body mass index is 24.81 kg/m.  Hearing/Vision screen Hearing Screening - Comments:: Wears hearing aids  Vision Screening - Comments:: Wears rx glasses - up to date with routine eye exams with pt [facility] [place]  Immunizations and Health Maintenance Health Maintenance  Topic Date Due   COVID-19 Vaccine (6 - 2025-26 season) 05/09/2024   DTaP/Tdap/Td (3 - Td or Tdap) 10/14/2024 (Originally 05/17/2022)   Medicare Annual Wellness (AWV)  09/19/2025   Pneumococcal Vaccine: 50+ Years  Completed   Influenza Vaccine  Completed   Zoster Vaccines- Shingrix  Completed  Meningococcal B Vaccine  Aged Out        Assessment/Plan:  This is a routine wellness examination for Br[place]lee.  Patient Care Team: Wendolyn Jenkins Jansky, MD as PCP - General (Family Medicine) Lonni Slain, MD as PCP - Cardiology (Cardiology) Nieves Cough, MD as Consulting Physician (Urology) Orpha Asberry RAMAN, MD as Consulting Physician (Sports Medicine) Vernetta Berg, MD as Consulting Physician (General Surgery) Cloretta Arley NOVAK, MD as Consulting Physician (Oncology) Colon Shove, MD as Consulting Physician (Neurosurgery) [facility] Cindie Ole DASEN, MD (Inactive) as Consulting Physician (Cardiology)  I have personally reviewed and noted the following in the patients chart:   Medical and social history Use of alcohol , tobacco or illicit drugs  Current medications and supplements including opioid prescriptions. Functional ability and status Nutritional status Physical activity Advanced directives List of other physicians Hospitalizations, surgeries, and ER visits in previous 12 months Vitals Screenings to include cognitive, depression, and falls Referrals and appointments  No orders of the defined types were placed in this encounter.  In addition, I have reviewed and discussed  with patient certain preventive protocols, quality metrics, and best practice recommendations. A written personalized care plan for preventive services as well as general preventive health recommendations were provided to patient.   Ellouise VEAR Haws, LPN   8/87/7973   Return in about 53 weeks (around 09/25/2025).  After Visit Summary: (MyChart) Due to this being a telephonic visit, the after visit summary with patients personalized plan was offered to patient via MyChart   Nurse Notes: No voiced or noted concerns at this time  "

## 2024-09-22 ENCOUNTER — Ambulatory Visit (HOSPITAL_BASED_OUTPATIENT_CLINIC_OR_DEPARTMENT_OTHER)

## 2024-09-28 ENCOUNTER — Encounter: Payer: Self-pay | Admitting: Family Medicine

## 2024-09-29 ENCOUNTER — Other Ambulatory Visit: Payer: Self-pay

## 2024-09-29 MED ORDER — FLUTICASONE PROPIONATE 50 MCG/ACT NA SUSP: 2.0000 | Freq: Every day | NASAL | 3 refills | Status: AC

## 2024-10-04 ENCOUNTER — Encounter (HOSPITAL_BASED_OUTPATIENT_CLINIC_OR_DEPARTMENT_OTHER): Payer: Self-pay

## 2024-10-04 ENCOUNTER — Ambulatory Visit (HOSPITAL_BASED_OUTPATIENT_CLINIC_OR_DEPARTMENT_OTHER)

## 2024-12-28 ENCOUNTER — Ambulatory Visit: Admitting: Dermatology

## 2025-01-17 ENCOUNTER — Ambulatory Visit: Admitting: Family Medicine
